# Patient Record
Sex: Male | Born: 1950 | Race: White | Hispanic: No | Smoking: Former smoker
Health system: Southern US, Community
[De-identification: ages and names within clinical notes are randomized; demographics above are authoritative.]

## PROBLEM LIST (undated history)

## (undated) DIAGNOSIS — I10 Essential (primary) hypertension: Secondary | ICD-10-CM

## (undated) DIAGNOSIS — H9312 Tinnitus, left ear: Secondary | ICD-10-CM

## (undated) DIAGNOSIS — H9190 Unspecified hearing loss, unspecified ear: Secondary | ICD-10-CM

## (undated) DIAGNOSIS — K759 Inflammatory liver disease, unspecified: Secondary | ICD-10-CM

## (undated) DIAGNOSIS — E7800 Pure hypercholesterolemia, unspecified: Secondary | ICD-10-CM

## (undated) DIAGNOSIS — F329 Major depressive disorder, single episode, unspecified: Secondary | ICD-10-CM

## (undated) DIAGNOSIS — F32A Depression, unspecified: Secondary | ICD-10-CM

## (undated) DIAGNOSIS — J189 Pneumonia, unspecified organism: Secondary | ICD-10-CM

## (undated) DIAGNOSIS — R011 Cardiac murmur, unspecified: Secondary | ICD-10-CM

## (undated) HISTORY — PX: NASAL SINUS SURGERY: SHX719

---

## 2016-02-13 DIAGNOSIS — I609 Nontraumatic subarachnoid hemorrhage, unspecified: Secondary | ICD-10-CM

## 2016-02-13 DIAGNOSIS — S069XAA Unspecified intracranial injury with loss of consciousness status unknown, initial encounter: Secondary | ICD-10-CM

## 2016-02-13 DIAGNOSIS — S069X9A Unspecified intracranial injury with loss of consciousness of unspecified duration, initial encounter: Secondary | ICD-10-CM

## 2016-02-13 HISTORY — DX: Unspecified intracranial injury with loss of consciousness status unknown, initial encounter: S06.9XAA

## 2016-02-13 HISTORY — DX: Nontraumatic subarachnoid hemorrhage, unspecified: I60.9

## 2016-02-13 HISTORY — DX: Unspecified intracranial injury with loss of consciousness of unspecified duration, initial encounter: S06.9X9A

## 2016-06-21 ENCOUNTER — Emergency Department (HOSPITAL_COMMUNITY): Payer: Medicare HMO

## 2016-06-21 ENCOUNTER — Encounter (HOSPITAL_COMMUNITY): Payer: Self-pay

## 2016-06-21 ENCOUNTER — Inpatient Hospital Stay (HOSPITAL_COMMUNITY)
Admission: EM | Admit: 2016-06-21 | Discharge: 2016-07-02 | DRG: 964 | Disposition: A | Discharge status: Rehabilitation Facility | Payer: Medicare HMO | Attending: Surgery | Admitting: Surgery

## 2016-06-21 DIAGNOSIS — Z23 Encounter for immunization: Secondary | ICD-10-CM

## 2016-06-21 DIAGNOSIS — S42021D Displaced fracture of shaft of right clavicle, subsequent encounter for fracture with routine healing: Secondary | ICD-10-CM | POA: Diagnosis not present

## 2016-06-21 DIAGNOSIS — S062X9D Diffuse traumatic brain injury with loss of consciousness of unspecified duration, subsequent encounter: Secondary | ICD-10-CM | POA: Diagnosis not present

## 2016-06-21 DIAGNOSIS — I82409 Acute embolism and thrombosis of unspecified deep veins of unspecified lower extremity: Secondary | ICD-10-CM | POA: Diagnosis not present

## 2016-06-21 DIAGNOSIS — S32591A Other specified fracture of right pubis, initial encounter for closed fracture: Secondary | ICD-10-CM | POA: Diagnosis present

## 2016-06-21 DIAGNOSIS — T1490XA Injury, unspecified, initial encounter: Secondary | ICD-10-CM

## 2016-06-21 DIAGNOSIS — J15211 Pneumonia due to Methicillin susceptible Staphylococcus aureus: Secondary | ICD-10-CM

## 2016-06-21 DIAGNOSIS — S42001A Fracture of unspecified part of right clavicle, initial encounter for closed fracture: Secondary | ICD-10-CM | POA: Diagnosis present

## 2016-06-21 DIAGNOSIS — R4701 Aphasia: Secondary | ICD-10-CM | POA: Diagnosis present

## 2016-06-21 DIAGNOSIS — S270XXA Traumatic pneumothorax, initial encounter: Secondary | ICD-10-CM | POA: Diagnosis present

## 2016-06-21 DIAGNOSIS — S069X9A Unspecified intracranial injury with loss of consciousness of unspecified duration, initial encounter: Secondary | ICD-10-CM

## 2016-06-21 DIAGNOSIS — S42034D Nondisplaced fracture of lateral end of right clavicle, subsequent encounter for fracture with routine healing: Secondary | ICD-10-CM | POA: Diagnosis not present

## 2016-06-21 DIAGNOSIS — R739 Hyperglycemia, unspecified: Secondary | ICD-10-CM | POA: Diagnosis not present

## 2016-06-21 DIAGNOSIS — E876 Hypokalemia: Secondary | ICD-10-CM | POA: Diagnosis not present

## 2016-06-21 DIAGNOSIS — R7989 Other specified abnormal findings of blood chemistry: Secondary | ICD-10-CM

## 2016-06-21 DIAGNOSIS — M7989 Other specified soft tissue disorders: Secondary | ICD-10-CM | POA: Diagnosis not present

## 2016-06-21 DIAGNOSIS — S2239XS Fracture of one rib, unspecified side, sequela: Secondary | ICD-10-CM

## 2016-06-21 DIAGNOSIS — R1310 Dysphagia, unspecified: Secondary | ICD-10-CM | POA: Diagnosis not present

## 2016-06-21 DIAGNOSIS — S2241XA Multiple fractures of ribs, right side, initial encounter for closed fracture: Secondary | ICD-10-CM | POA: Diagnosis present

## 2016-06-21 DIAGNOSIS — H9190 Unspecified hearing loss, unspecified ear: Secondary | ICD-10-CM | POA: Diagnosis present

## 2016-06-21 DIAGNOSIS — S062X9A Diffuse traumatic brain injury with loss of consciousness of unspecified duration, initial encounter: Principal | ICD-10-CM | POA: Diagnosis present

## 2016-06-21 DIAGNOSIS — R1312 Dysphagia, oropharyngeal phase: Secondary | ICD-10-CM | POA: Diagnosis not present

## 2016-06-21 DIAGNOSIS — R402432 Glasgow coma scale score 3-8, at arrival to emergency department: Secondary | ICD-10-CM | POA: Diagnosis present

## 2016-06-21 DIAGNOSIS — S299XXA Unspecified injury of thorax, initial encounter: Secondary | ICD-10-CM

## 2016-06-21 DIAGNOSIS — S069X9D Unspecified intracranial injury with loss of consciousness of unspecified duration, subsequent encounter: Secondary | ICD-10-CM | POA: Diagnosis not present

## 2016-06-21 DIAGNOSIS — I615 Nontraumatic intracerebral hemorrhage, intraventricular: Secondary | ICD-10-CM

## 2016-06-21 DIAGNOSIS — S066X9A Traumatic subarachnoid hemorrhage with loss of consciousness of unspecified duration, initial encounter: Secondary | ICD-10-CM | POA: Diagnosis present

## 2016-06-21 DIAGNOSIS — Z7409 Other reduced mobility: Secondary | ICD-10-CM | POA: Diagnosis not present

## 2016-06-21 DIAGNOSIS — S3210XA Unspecified fracture of sacrum, initial encounter for closed fracture: Secondary | ICD-10-CM

## 2016-06-21 DIAGNOSIS — S062X3D Diffuse traumatic brain injury with loss of consciousness of 1 hour to 5 hours 59 minutes, subsequent encounter: Secondary | ICD-10-CM | POA: Diagnosis not present

## 2016-06-21 DIAGNOSIS — F17210 Nicotine dependence, cigarettes, uncomplicated: Secondary | ICD-10-CM | POA: Diagnosis present

## 2016-06-21 DIAGNOSIS — R471 Dysarthria and anarthria: Secondary | ICD-10-CM | POA: Diagnosis present

## 2016-06-21 DIAGNOSIS — F329 Major depressive disorder, single episode, unspecified: Secondary | ICD-10-CM | POA: Diagnosis present

## 2016-06-21 DIAGNOSIS — S329XXA Fracture of unspecified parts of lumbosacral spine and pelvis, initial encounter for closed fracture: Secondary | ICD-10-CM

## 2016-06-21 DIAGNOSIS — R339 Retention of urine, unspecified: Secondary | ICD-10-CM | POA: Diagnosis not present

## 2016-06-21 DIAGNOSIS — S32591D Other specified fracture of right pubis, subsequent encounter for fracture with routine healing: Secondary | ICD-10-CM | POA: Diagnosis not present

## 2016-06-21 DIAGNOSIS — G8191 Hemiplegia, unspecified affecting right dominant side: Secondary | ICD-10-CM | POA: Diagnosis present

## 2016-06-21 DIAGNOSIS — S069XAA Unspecified intracranial injury with loss of consciousness status unknown, initial encounter: Secondary | ICD-10-CM

## 2016-06-21 DIAGNOSIS — R069 Unspecified abnormalities of breathing: Secondary | ICD-10-CM

## 2016-06-21 DIAGNOSIS — G9340 Encephalopathy, unspecified: Secondary | ICD-10-CM | POA: Diagnosis not present

## 2016-06-21 DIAGNOSIS — I10 Essential (primary) hypertension: Secondary | ICD-10-CM | POA: Diagnosis present

## 2016-06-21 DIAGNOSIS — I609 Nontraumatic subarachnoid hemorrhage, unspecified: Secondary | ICD-10-CM | POA: Diagnosis not present

## 2016-06-21 DIAGNOSIS — R509 Fever, unspecified: Secondary | ICD-10-CM

## 2016-06-21 DIAGNOSIS — S2239XA Fracture of one rib, unspecified side, initial encounter for closed fracture: Secondary | ICD-10-CM

## 2016-06-21 DIAGNOSIS — S2249XA Multiple fractures of ribs, unspecified side, initial encounter for closed fracture: Secondary | ICD-10-CM

## 2016-06-21 DIAGNOSIS — W11XXXA Fall on and from ladder, initial encounter: Secondary | ICD-10-CM | POA: Diagnosis not present

## 2016-06-21 DIAGNOSIS — J9690 Respiratory failure, unspecified, unspecified whether with hypoxia or hypercapnia: Secondary | ICD-10-CM

## 2016-06-21 DIAGNOSIS — R918 Other nonspecific abnormal finding of lung field: Secondary | ICD-10-CM

## 2016-06-21 DIAGNOSIS — S062X3S Diffuse traumatic brain injury with loss of consciousness of 1 hour to 5 hours 59 minutes, sequela: Secondary | ICD-10-CM | POA: Diagnosis not present

## 2016-06-21 DIAGNOSIS — S2241XD Multiple fractures of ribs, right side, subsequent encounter for fracture with routine healing: Secondary | ICD-10-CM | POA: Diagnosis not present

## 2016-06-21 DIAGNOSIS — G479 Sleep disorder, unspecified: Secondary | ICD-10-CM | POA: Diagnosis not present

## 2016-06-21 DIAGNOSIS — J15212 Pneumonia due to Methicillin resistant Staphylococcus aureus: Secondary | ICD-10-CM | POA: Diagnosis not present

## 2016-06-21 DIAGNOSIS — S42034S Nondisplaced fracture of lateral end of right clavicle, sequela: Secondary | ICD-10-CM | POA: Diagnosis not present

## 2016-06-21 DIAGNOSIS — G3184 Mild cognitive impairment, so stated: Secondary | ICD-10-CM | POA: Diagnosis not present

## 2016-06-21 HISTORY — DX: Pure hypercholesterolemia, unspecified: E78.00

## 2016-06-21 HISTORY — DX: Cardiac murmur, unspecified: R01.1

## 2016-06-21 HISTORY — DX: Unspecified hearing loss, unspecified ear: H91.90

## 2016-06-21 HISTORY — DX: Essential (primary) hypertension: I10

## 2016-06-21 HISTORY — DX: Major depressive disorder, single episode, unspecified: F32.9

## 2016-06-21 HISTORY — DX: Depression, unspecified: F32.A

## 2016-06-21 HISTORY — DX: Tinnitus, left ear: H93.12

## 2016-06-21 LAB — BPAM FFP
Blood Product Expiration Date: 201805212359
Blood Product Expiration Date: 201805212359
ISSUE DATE / TIME: 201805101640
ISSUE DATE / TIME: 201805101640
UNIT TYPE AND RH: 600
UNIT TYPE AND RH: 6200

## 2016-06-21 LAB — COMPREHENSIVE METABOLIC PANEL
ALT: 59 U/L (ref 17–63)
AST: 66 U/L — AB (ref 15–41)
Albumin: 4.1 g/dL (ref 3.5–5.0)
Alkaline Phosphatase: 89 U/L (ref 38–126)
Anion gap: 12 (ref 5–15)
BUN: 15 mg/dL (ref 6–20)
CHLORIDE: 103 mmol/L (ref 101–111)
CO2: 23 mmol/L (ref 22–32)
CREATININE: 1.31 mg/dL — AB (ref 0.61–1.24)
Calcium: 9.3 mg/dL (ref 8.9–10.3)
GFR calc Af Amer: >60 mL/min (ref >60)
GFR calc non Af Amer: 56 mL/min — ABNORMAL LOW (ref >60)
GLUCOSE: 167 mg/dL — AB (ref 65–99)
Potassium: 3.9 mmol/L (ref 3.5–5.1)
SODIUM: 138 mmol/L (ref 135–145)
Total Bilirubin: 1.1 mg/dL (ref 0.3–1.2)
Total Protein: 7 g/dL (ref 6.5–8.1)

## 2016-06-21 LAB — URINALYSIS, ROUTINE W REFLEX MICROSCOPIC
BILIRUBIN URINE: NEGATIVE
GLUCOSE, UA: NEGATIVE mg/dL
HGB URINE DIPSTICK: NEGATIVE
KETONES UR: NEGATIVE mg/dL
Leukocytes, UA: NEGATIVE
Nitrite: NEGATIVE
PH: 6 (ref 5.0–8.0)
Protein, ur: NEGATIVE mg/dL
SPECIFIC GRAVITY, URINE: 1.033 — AB (ref 1.005–1.030)

## 2016-06-21 LAB — PREPARE FRESH FROZEN PLASMA
UNIT DIVISION: 0
Unit division: 0

## 2016-06-21 LAB — I-STAT CHEM 8, ED
BUN: 17 mg/dL (ref 6–20)
Calcium, Ion: 1.08 mmol/L — ABNORMAL LOW (ref 1.15–1.40)
Chloride: 103 mmol/L (ref 101–111)
Creatinine, Ser: 1.2 mg/dL (ref 0.61–1.24)
Glucose, Bld: 159 mg/dL — ABNORMAL HIGH (ref 65–99)
HEMATOCRIT: 48 % (ref 39.0–52.0)
HEMOGLOBIN: 16.3 g/dL (ref 13.0–17.0)
POTASSIUM: 3.8 mmol/L (ref 3.5–5.1)
Sodium: 140 mmol/L (ref 135–145)
TCO2: 26 mmol/L (ref 0–100)

## 2016-06-21 LAB — CBC
HCT: 47.8 % (ref 39.0–52.0)
Hemoglobin: 16.6 g/dL (ref 13.0–17.0)
MCH: 32.7 pg (ref 26.0–34.0)
MCHC: 34.7 g/dL (ref 30.0–36.0)
MCV: 94.1 fL (ref 78.0–100.0)
PLATELETS: 229 10*3/uL (ref 150–400)
RBC: 5.08 MIL/uL (ref 4.22–5.81)
RDW: 13.6 % (ref 11.5–15.5)
WBC: 26.3 10*3/uL — ABNORMAL HIGH (ref 4.0–10.5)

## 2016-06-21 LAB — ABO/RH: ABO/RH(D): B POS

## 2016-06-21 LAB — I-STAT CG4 LACTIC ACID, ED: Lactic Acid, Venous: 2.44 mmol/L (ref 0.5–1.9)

## 2016-06-21 LAB — PROTIME-INR
INR: 1.03
Prothrombin Time: 13.5 seconds (ref 11.4–15.2)

## 2016-06-21 LAB — ETHANOL: Alcohol, Ethyl (B): <5 mg/dL (ref <5)

## 2016-06-21 LAB — MRSA PCR SCREENING: MRSA BY PCR: NEGATIVE

## 2016-06-21 MED ORDER — ONDANSETRON HCL 4 MG/2ML IJ SOLN
INTRAMUSCULAR | Status: AC
  Filled 2016-06-21: qty 2

## 2016-06-21 MED ORDER — ONDANSETRON HCL 4 MG/2ML IJ SOLN
4.0000 mg | Freq: Once | INTRAMUSCULAR | Status: AC
  Administered 2016-06-21: 4 mg via INTRAVENOUS

## 2016-06-21 MED ORDER — MORPHINE SULFATE (PF) 4 MG/ML IV SOLN
1.0000 mg | INTRAVENOUS | Status: DC | PRN
  Administered 2016-06-21 – 2016-06-22 (×2): 1 mg via INTRAVENOUS
  Filled 2016-06-21 (×2): qty 1

## 2016-06-21 MED ORDER — SODIUM CHLORIDE 0.9 % IV BOLUS (SEPSIS)
1000.0000 mL | Freq: Once | INTRAVENOUS | Status: AC
  Administered 2016-06-21: 1000 mL via INTRAVENOUS

## 2016-06-21 MED ORDER — SODIUM CHLORIDE 0.9 % IV SOLN
INTRAVENOUS | Status: DC
  Administered 2016-06-21 – 2016-07-02 (×10): via INTRAVENOUS

## 2016-06-21 MED ORDER — ONDANSETRON HCL 4 MG/2ML IJ SOLN
4.0000 mg | Freq: Four times a day (QID) | INTRAMUSCULAR | Status: DC | PRN
  Filled 2016-06-21: qty 2

## 2016-06-21 MED ORDER — ONDANSETRON HCL 4 MG PO TABS: 4.0000 mg | ORAL_TABLET | Freq: Four times a day (QID) | ORAL | Status: DC | PRN

## 2016-06-21 MED ORDER — IOPAMIDOL (ISOVUE-300) INJECTION 61%
INTRAVENOUS | Status: AC
  Administered 2016-06-21: 100 mL
  Filled 2016-06-21: qty 100

## 2016-06-21 MED ORDER — MORPHINE SULFATE (PF) 4 MG/ML IV SOLN
4.0000 mg | INTRAVENOUS | Status: DC | PRN
  Administered 2016-06-22 – 2016-06-27 (×27): 4 mg via INTRAVENOUS
  Filled 2016-06-21 (×26): qty 1

## 2016-06-21 MED ORDER — PNEUMOCOCCAL VAC POLYVALENT 25 MCG/0.5ML IJ INJ
0.5000 mL | INJECTION | INTRAMUSCULAR | Status: AC
  Administered 2016-06-22: 0.5 mL via INTRAMUSCULAR
  Filled 2016-06-21: qty 0.5

## 2016-06-21 MED ORDER — MORPHINE SULFATE (PF) 4 MG/ML IV SOLN
2.0000 mg | INTRAVENOUS | Status: DC | PRN
  Administered 2016-06-22 – 2016-06-25 (×7): 2 mg via INTRAVENOUS
  Filled 2016-06-21 (×10): qty 1

## 2016-06-21 NOTE — ED Provider Notes (Signed)
[facility]-EMERGENCY DEPT Provider Note   CSN: [id number] Arrival date & time: 06/21/16  1652     History   Chief Complaint Chief Complaint  Patient presents with  . Fall    HPI Tyler Lane is a 66 y.o. male.  The history is provided by the EMS personnel.  Trauma Mechanism of injury: fall Injury location: head/neck Injury location detail: head Incident location: home Arrived directly from scene: yes   Fall:      Fall occurred: from a ladder and from a roof      Height of fall: possibly 10 feet or more      Impact surface: unknown      Point of impact: unknown  EMS/PTA data:      Bystander interventions: none      Ambulatory at scene: no      Blood loss: none      Responsiveness: responsive to pain      Airway interventions: none      Breathing interventions: none      IV access: established      Fluids administered: none      Cardiac interventions: none      Medications administered: none      Immobilization: C-collar      Airway condition since incident: stable      Breathing condition since incident: stable      Circulation condition since incident: stable      Mental status condition since incident: improving (GCS 8 with EMS and on arrival GCS 9)      Disability condition since incident: stable  Current symptoms:      Pain quality: unable to describe  Relevant PMH:      Tetanus status: unknown   Past Medical History:  Diagnosis Date  . Depression   . Heart murmur    previously  . High cholesterol   . HOH (hard of hearing)   . Hypertension     Patient Active Problem List   Diagnosis Date Noted  . TBI (traumatic brain injury) (HCC) 06/21/2016    History reviewed. No pertinent surgical history.     Home Medications    Prior to Admission medications   Not on File    Family History History reviewed. No pertinent family history.  Social History Social History  Substance Use Topics  . Smoking status: Current Every Day Smoker   Packs/day: 1.00    Years: 20.00    Types: Cigarettes  . Smokeless tobacco: Never Used     Comment: patient unable to respond  . Alcohol use No     Allergies   Patient has no known allergies.   Review of Systems Review of Systems  Unable to perform ROS: Acuity of condition     Physical Exam Updated Vital Signs ED Triage Vitals  Enc Vitals Group     BP 06/21/16 1655 110/70     Pulse Rate 06/21/16 1655 75     Resp 06/21/16 1655 (!) 25     Temp 06/21/16 1657 98.5 F (36.9 C)     Temp Source 06/21/16 1657 Temporal     SpO2 06/21/16 1651 92 %     Weight 06/21/16 2020 178 lb 2.1 oz (80.8 kg)     Height 06/21/16 1705 5\' 10"  (1.778 m)     Head Circumference --      Peak Flow --      Pain Score --      Pain Loc --  Pain Edu? --      Excl. in [facility]? --     Physical Exam  Constitutional: He appears distressed.  HENT:  Head: Normocephalic.  Mouth/Throat: Oropharynx is clear and moist.  Bruising to right side of face, TMs clear  Eyes: EOM are normal. Pupils are equal, round, and reactive to light.  Neck: Neck supple.  In c-collar  Cardiovascular: Normal rate, regular rhythm, normal heart sounds and intact distal pulses.   Pulmonary/Chest: Effort normal and breath sounds normal. No respiratory distress.  Abdominal: Soft. He exhibits no distension. There is no tenderness.  Musculoskeletal: He exhibits tenderness (TTP to right shoulder).  Neurological: He is alert.  Responds to pain and voice, opens eyes to voice, moves extremities, does not speak  Skin:  Bruising to right shoulder     ED Treatments / Results  Labs (all labs ordered are listed, but only abnormal results are displayed) Labs Reviewed  COMPREHENSIVE METABOLIC PANEL - Abnormal; Notable for the following:       Result Value   Glucose, Bld 167 (*)    Creatinine, Ser 1.31 (*)    AST 66 (*)    GFR calc non Af Amer 56 (*)    All other components within normal limits  CBC - Abnormal; Notable for the  following:    WBC 26.3 (*)    All other components within normal limits  URINALYSIS, ROUTINE W REFLEX MICROSCOPIC - Abnormal; Notable for the following:    Specific Gravity, Urine 1.033 (*)    All other components within normal limits  I-STAT CHEM 8, ED - Abnormal; Notable for the following:    Glucose, Bld 159 (*)    Calcium, Ion 1.08 (*)    All other components within normal limits  I-STAT CG4 LACTIC ACID, ED - Abnormal; Notable for the following:    Lactic Acid, Venous 2.44 (*)    All other components within normal limits  MRSA PCR SCREENING  ETHANOL  PROTIME-INR  CDS SEROLOGY  CBC  BASIC METABOLIC PANEL  TYPE AND SCREEN  PREPARE FRESH FROZEN PLASMA  ABO/RH    EKG  EKG Interpretation None       Radiology Ct Head Wo Contrast  Result Date: 06/21/2016 CLINICAL DATA:  Found on ground near ladder. Patient unresponsive. Concern for head, maxillofacial or cervical spine injury. Initial encounter. EXAM: CT HEAD WITHOUT CONTRAST CT MAXILLOFACIAL WITHOUT CONTRAST CT CERVICAL SPINE WITHOUT CONTRAST TECHNIQUE: Multidetector CT imaging of the head, cervical spine, and maxillofacial structures were performed using the standard protocol without intravenous contrast. Multiplanar CT image reconstructions of the cervical spine and maxillofacial structures were also generated. COMPARISON:  None. FINDINGS: CT HEAD FINDINGS Brain: There appears to be a small amount of subarachnoid hemorrhage along the sylvian fissures bilaterally, and at the high left parietal lobe and right frontal lobe. No evidence of acute infarction, hemorrhage, hydrocephalus, extra-axial collection or mass lesion/mass effect. Prominence of the ventricles and sulci reflects mild cortical volume loss. Mild cerebellar atrophy is noted. The brainstem and fourth ventricle are within normal limits. The basal ganglia are unremarkable in appearance. The cerebral hemispheres demonstrate grossly normal gray-white differentiation. No mass  effect or midline shift is seen. Vascular: No hyperdense vessel or unexpected calcification. Skull: There is no evidence of fracture; visualized osseous structures are unremarkable in appearance. Other: Mild soft tissue swelling is noted lateral to the right orbit. CT MAXILLOFACIAL FINDINGS Osseous: There is no evidence of fracture or dislocation. The maxilla and mandible appear intact. The nasal  bone is unremarkable in appearance. The visualized dentition demonstrates no acute abnormality. Orbits: The orbits are intact bilaterally. Sinuses: Mucosal thickening is noted at the left maxillary sinus. The remaining visualized paranasal sinuses and mastoid air cells are well-aerated. Soft tissues: Soft tissue swelling is noted along the upper lip. Mild soft tissue injury is suggested at the anterior vertex. The parapharyngeal fat planes are preserved. The nasopharynx, oropharynx and hypopharynx are unremarkable in appearance. The visualized portions of the valleculae and piriform sinuses are grossly unremarkable. The parotid and submandibular glands are within normal limits. No cervical lymphadenopathy is seen. CT CERVICAL SPINE FINDINGS Alignment: Normal. Skull base and vertebrae: No acute fracture. No primary bone lesion or focal pathologic process. There is a displaced fracture through the right mid clavicle. Soft tissues and spinal canal: No prevertebral fluid or swelling. No visible canal hematoma. Disc levels: Intervertebral disc spaces are preserved. Mild facet disease is noted at the mid cervical spine. Mild degenerative change is noted about the dens. Upper chest: Emphysema is noted at the lung apices. The thyroid gland is unremarkable in appearance. Mild calcification is noted at the left carotid bifurcation. Other: No additional soft tissue abnormalities are seen. IMPRESSION: 1. Mild acute subarachnoid hemorrhage along the sylvian fissures bilaterally, at the high left parietal lobe and right right frontal  lobe. 2. No evidence of fracture or subluxation along the cervical spine. 3. Displaced fracture through the right mid clavicle. 4. No evidence of fracture or dislocation with regard to the maxillofacial structures. 5. Soft tissue swelling along the upper lip. Mild soft tissue injury suggested at the anterior vertex. Mild soft tissue swelling lateral to the right orbit. 6. Mild cortical volume loss. 7. Emphysema at the lung apices. 8. Mild calcification at the left carotid bifurcation. Critical Value/emergent results were called by telephone at the time of interpretation on 06/21/2016 at 6:12 pm to Dr. Magnus Ivan, who verbally acknowledged these results. Electronically Signed   By: Roanna Raider M.D.   On: 06/21/2016 18:17   Ct Cervical Spine Wo Contrast  Result Date: 06/21/2016 CLINICAL DATA:  Found on ground near ladder. Patient unresponsive. Concern for head, maxillofacial or cervical spine injury. Initial encounter. EXAM: CT HEAD WITHOUT CONTRAST CT MAXILLOFACIAL WITHOUT CONTRAST CT CERVICAL SPINE WITHOUT CONTRAST TECHNIQUE: Multidetector CT imaging of the head, cervical spine, and maxillofacial structures were performed using the standard protocol without intravenous contrast. Multiplanar CT image reconstructions of the cervical spine and maxillofacial structures were also generated. COMPARISON:  None. FINDINGS: CT HEAD FINDINGS Brain: There appears to be a small amount of subarachnoid hemorrhage along the sylvian fissures bilaterally, and at the high left parietal lobe and right frontal lobe. No evidence of acute infarction, hemorrhage, hydrocephalus, extra-axial collection or mass lesion/mass effect. Prominence of the ventricles and sulci reflects mild cortical volume loss. Mild cerebellar atrophy is noted. The brainstem and fourth ventricle are within normal limits. The basal ganglia are unremarkable in appearance. The cerebral hemispheres demonstrate grossly normal gray-white differentiation. No mass  effect or midline shift is seen. Vascular: No hyperdense vessel or unexpected calcification. Skull: There is no evidence of fracture; visualized osseous structures are unremarkable in appearance. Other: Mild soft tissue swelling is noted lateral to the right orbit. CT MAXILLOFACIAL FINDINGS Osseous: There is no evidence of fracture or dislocation. The maxilla and mandible appear intact. The nasal bone is unremarkable in appearance. The visualized dentition demonstrates no acute abnormality. Orbits: The orbits are intact bilaterally. Sinuses: Mucosal thickening is noted at the left maxillary sinus.  The remaining visualized paranasal sinuses and mastoid air cells are well-aerated. Soft tissues: Soft tissue swelling is noted along the upper lip. Mild soft tissue injury is suggested at the anterior vertex. The parapharyngeal fat planes are preserved. The nasopharynx, oropharynx and hypopharynx are unremarkable in appearance. The visualized portions of the valleculae and piriform sinuses are grossly unremarkable. The parotid and submandibular glands are within normal limits. No cervical lymphadenopathy is seen. CT CERVICAL SPINE FINDINGS Alignment: Normal. Skull base and vertebrae: No acute fracture. No primary bone lesion or focal pathologic process. There is a displaced fracture through the right mid clavicle. Soft tissues and spinal canal: No prevertebral fluid or swelling. No visible canal hematoma. Disc levels: Intervertebral disc spaces are preserved. Mild facet disease is noted at the mid cervical spine. Mild degenerative change is noted about the dens. Upper chest: Emphysema is noted at the lung apices. The thyroid gland is unremarkable in appearance. Mild calcification is noted at the left carotid bifurcation. Other: No additional soft tissue abnormalities are seen. IMPRESSION: 1. Mild acute subarachnoid hemorrhage along the sylvian fissures bilaterally, at the high left parietal lobe and right right frontal  lobe. 2. No evidence of fracture or subluxation along the cervical spine. 3. Displaced fracture through the right mid clavicle. 4. No evidence of fracture or dislocation with regard to the maxillofacial structures. 5. Soft tissue swelling along the upper lip. Mild soft tissue injury suggested at the anterior vertex. Mild soft tissue swelling lateral to the right orbit. 6. Mild cortical volume loss. 7. Emphysema at the lung apices. 8. Mild calcification at the left carotid bifurcation. Critical Value/emergent results were called by telephone at the time of interpretation on 06/21/2016 at 6:12 pm to Dr. Magnus Ivan, who verbally acknowledged these results. Electronically Signed   By: Roanna Raider M.D.   On: 06/21/2016 18:17   Dg Pelvis Portable  Result Date: 06/21/2016 CLINICAL DATA:  66 year old who fell from a 10 foot ladder earlier today. Initial encounter. EXAM: PORTABLE PELVIS 1-2 VIEWS COMPARISON:  None. FINDINGS: Comminuted, mildly displaced fracture involving the right superior pubic ramus. No other visible fractures. Sacroiliac joints and symphysis pubis intact without evidence of diastasis. Benign bone island in the roof of the right acetabulum. IMPRESSION: Acute traumatic mildly displaced fracture involving the right superior pubic ramus. Electronically Signed   By: Hulan Saas M.D.   On: 06/21/2016 17:19   Dg Chest Port 1 View  Result Date: 06/21/2016 CLINICAL DATA:  66 year old who fell from a 10 foot ladder earlier today. Initial encounter. EXAM: PORTABLE CHEST 1 VIEW COMPARISON:  None. FINDINGS: Suboptimal inspiration accounts for crowded bronchovascular markings, especially in the bases, and accentuates the cardiac silhouette. Taking this into account, cardiac silhouette upper normal in size. Thoracic aorta mildly atherosclerotic. Slight widening of the superior mediastinum. Linear atelectasis in the right mid lung. Linear scar or atelectasis in the left mid lung. Comminuted fracture  involving the right lateral second rib. No visible pneumothorax. IMPRESSION: 1. Suboptimal inspiration. Linear atelectasis in the right mid lung. Linear atelectasis or scarring left mid lung. 2. Comminuted fracture involving the right lateral second rib. No visible pneumothorax. 3. Slight widening of the mediastinum, though this may just be positional and related to the AP portable technique. I telephoned these results at the time of interpretation on 06/21/2016 at 5:22 pm to Dr. Virgina Norfolk, who verbally acknowledged these results. Electronically Signed   By: Hulan Saas M.D.   On: 06/21/2016 17:26   Ct Maxillofacial Wo Cm  Result Date: 06/21/2016 CLINICAL DATA:  Found on ground near ladder. Patient unresponsive. Concern for head, maxillofacial or cervical spine injury. Initial encounter. EXAM: CT HEAD WITHOUT CONTRAST CT MAXILLOFACIAL WITHOUT CONTRAST CT CERVICAL SPINE WITHOUT CONTRAST TECHNIQUE: Multidetector CT imaging of the head, cervical spine, and maxillofacial structures were performed using the standard protocol without intravenous contrast. Multiplanar CT image reconstructions of the cervical spine and maxillofacial structures were also generated. COMPARISON:  None. FINDINGS: CT HEAD FINDINGS Brain: There appears to be a small amount of subarachnoid hemorrhage along the sylvian fissures bilaterally, and at the high left parietal lobe and right frontal lobe. No evidence of acute infarction, hemorrhage, hydrocephalus, extra-axial collection or mass lesion/mass effect. Prominence of the ventricles and sulci reflects mild cortical volume loss. Mild cerebellar atrophy is noted. The brainstem and fourth ventricle are within normal limits. The basal ganglia are unremarkable in appearance. The cerebral hemispheres demonstrate grossly normal gray-white differentiation. No mass effect or midline shift is seen. Vascular: No hyperdense vessel or unexpected calcification. Skull: There is no evidence of  fracture; visualized osseous structures are unremarkable in appearance. Other: Mild soft tissue swelling is noted lateral to the right orbit. CT MAXILLOFACIAL FINDINGS Osseous: There is no evidence of fracture or dislocation. The maxilla and mandible appear intact. The nasal bone is unremarkable in appearance. The visualized dentition demonstrates no acute abnormality. Orbits: The orbits are intact bilaterally. Sinuses: Mucosal thickening is noted at the left maxillary sinus. The remaining visualized paranasal sinuses and mastoid air cells are well-aerated. Soft tissues: Soft tissue swelling is noted along the upper lip. Mild soft tissue injury is suggested at the anterior vertex. The parapharyngeal fat planes are preserved. The nasopharynx, oropharynx and hypopharynx are unremarkable in appearance. The visualized portions of the valleculae and piriform sinuses are grossly unremarkable. The parotid and submandibular glands are within normal limits. No cervical lymphadenopathy is seen. CT CERVICAL SPINE FINDINGS Alignment: Normal. Skull base and vertebrae: No acute fracture. No primary bone lesion or focal pathologic process. There is a displaced fracture through the right mid clavicle. Soft tissues and spinal canal: No prevertebral fluid or swelling. No visible canal hematoma. Disc levels: Intervertebral disc spaces are preserved. Mild facet disease is noted at the mid cervical spine. Mild degenerative change is noted about the dens. Upper chest: Emphysema is noted at the lung apices. The thyroid gland is unremarkable in appearance. Mild calcification is noted at the left carotid bifurcation. Other: No additional soft tissue abnormalities are seen. IMPRESSION: 1. Mild acute subarachnoid hemorrhage along the sylvian fissures bilaterally, at the high left parietal lobe and right right frontal lobe. 2. No evidence of fracture or subluxation along the cervical spine. 3. Displaced fracture through the right mid clavicle.  4. No evidence of fracture or dislocation with regard to the maxillofacial structures. 5. Soft tissue swelling along the upper lip. Mild soft tissue injury suggested at the anterior vertex. Mild soft tissue swelling lateral to the right orbit. 6. Mild cortical volume loss. 7. Emphysema at the lung apices. 8. Mild calcification at the left carotid bifurcation. Critical Value/emergent results were called by telephone at the time of interpretation on 06/21/2016 at 6:12 pm to Dr. Magnus IvanBlackman, who verbally acknowledged these results. Electronically Signed   By: Roanna RaiderJeffery  Chang M.D.   On: 06/21/2016 18:17    Procedures Procedures (including critical care time)  Medications Ordered in ED Medications  0.9 %  sodium chloride infusion ( Intravenous Duplicate 06/21/16 2139)  morphine 4 MG/ML injection 1 mg (1 mg  Intravenous Given 06/21/16 2242)  morphine 4 MG/ML injection 2 mg (not administered)  morphine 4 MG/ML injection 4 mg (not administered)  ondansetron (ZOFRAN) tablet 4 mg (not administered)    Or  ondansetron (ZOFRAN) injection 4 mg (not administered)  pneumococcal 23 valent vaccine (PNU-IMMUNE) injection 0.5 mL (not administered)  ondansetron (ZOFRAN) 4 MG/2ML injection (  Duplicate 06/21/16 2040)  sodium chloride 0.9 % bolus 1,000 mL (1,000 mLs Intravenous New Bag/Given 06/21/16 1742)  ondansetron (ZOFRAN) injection 4 mg (4 mg Intravenous Given 06/21/16 1655)  iopamidol (ISOVUE-300) 61 % injection (100 mLs  Contrast Given 06/21/16 1730)     Initial Impression / Assessment and Plan / ED Course  I have reviewed the triage vital signs and the nursing notes.  Pertinent labs & imaging results that were available during my care of the patient were reviewed by me and considered in my medical decision making (see chart for details).     Alee Katen is a 66 year old male who presents to the ED as a level I trauma after being found down next to a ladder, presumed fall of about 10 feet. Patient's vitals at  time of arrival to the ED are unremarkable and patient is without fever. Patient with GCS of 8 with EMS and seemed to improve to GCS of 9. Patient is able to follow commands and respond to pain but is unable to speak. Patient has bruising over the right side of his face and right side of his arm. Patient appears to be tender in the right clavicle area as well. Patient otherwise with no obvious gross deformities. Patient with trauma labs and scans ordered. Bedside cxr and pelvic xray should rib fracture and stable pelvic fx. Patient found to have subarachnoid hemorrhage bilaterally, right clavicle fracture, right rib fracture, pelvic fracture. Trauma surgery to admit the patient and assumed care of patient in stable condition. Trauma, Dr. Magnus Ivan, to contact neurosurgery and other consults. Labs overall unremarkable except for a lactic acidosis and leukocytosis likely secondary to trauma. Patient given normal saline bolus. Patient transferred to the ICU in stable but critical condition. Mental status stable throughout ED stay.   Final Clinical Impressions(s) / ED Diagnoses   Final diagnoses:  SAH (subarachnoid hemorrhage) (HCC)  Closed nondisplaced fracture of right clavicle, unspecified part of clavicle, initial encounter  Closed fracture of one rib, unspecified laterality, sequela    New Prescriptions There are no discharge medications for this patient.    Virgina Norfolk, DO 06/22/16 1914

## 2016-06-21 NOTE — ED Triage Notes (Signed)
Pt brought in by [facility]. Pt found on ground near ladder. Pt unresponsive at seen. Height of ladder to roof approximately 9012ft per EMS. Pt disoriented on arrival with no clear speech. No obvious signs of injury. Initial GCS of 8. Last seen normal 1330.

## 2016-06-21 NOTE — H&P (Addendum)
History   Tyler Lane is an 66 y.o. male.   Chief Complaint:  Chief Complaint  Patient presents with  . Fall    Fall   This gentleman presents as a level I trauma after a fall. The fall was not witnessed. He was found down beside an approximate 10 foot ladder. He arrived with a GCS reported at 8. He was otherwise hemodynamically stable.  No past medical history on file.  No past surgical history on file.  No family history on file. Social History:  has no tobacco, alcohol, and drug history on file.  Allergies  Allergies not on file  Home Medications   (Not in a hospital admission)  Trauma Course   Results for orders placed or performed during the hospital encounter of 06/21/16 (from the past 48 hour(s))  Prepare fresh frozen plasma     Status: None (Preliminary result)   Collection Time: 06/21/16  4:38 PM  Result Value Ref Range   Unit Number [id number]    Blood Component Type LIQ PLASMA    Unit division 00    Status of Unit ISSUED    Unit tag comment VERBAL ORDERS PER DR [facility]    Transfusion Status OK TO TRANSFUSE    Unit Number [id number]    Blood Component Type LIQ PLASMA    Unit division 00    Status of Unit ISSUED    Unit tag comment VERBAL ORDERS PER DR [facility]    Transfusion Status OK TO TRANSFUSE   Comprehensive metabolic panel     Status: Abnormal   Collection Time: 06/21/16  4:59 PM  Result Value Ref Range   Sodium 138 135 - 145 mmol/L   Potassium 3.9 3.5 - 5.1 mmol/L   Chloride 103 101 - 111 mmol/L   CO2 23 22 - 32 mmol/L   Glucose, Bld 167 (H) 65 - 99 mg/dL   BUN 15 6 - 20 mg/dL   Creatinine, Ser 1.31 (H) 0.61 - 1.24 mg/dL   Calcium 9.3 8.9 - 10.3 mg/dL   Total Protein 7.0 6.5 - 8.1 g/dL   Albumin 4.1 3.5 - 5.0 g/dL   AST 66 (H) 15 - 41 U/L   ALT 59 17 - 63 U/L   Alkaline Phosphatase 89 38 - 126 U/L   Total Bilirubin 1.1 0.3 - 1.2 mg/dL   GFR calc non Af Amer 56 (L) >60 mL/min   GFR calc Af Amer >60 >60 mL/min    Comment:  (NOTE) The eGFR has been calculated using the CKD EPI equation. This calculation has not been validated in all clinical situations. eGFR's persistently <60 mL/min signify possible Chronic Kidney Disease.    Anion gap 12 5 - 15  CBC     Status: Abnormal   Collection Time: 06/21/16  4:59 PM  Result Value Ref Range   WBC 26.3 (H) 4.0 - 10.5 K/uL   RBC 5.08 4.22 - 5.81 MIL/uL   Hemoglobin 16.6 13.0 - 17.0 g/dL   HCT 47.8 39.0 - 52.0 %   MCV 94.1 78.0 - 100.0 fL   MCH 32.7 26.0 - 34.0 pg   MCHC 34.7 30.0 - 36.0 g/dL   RDW 13.6 11.5 - 15.5 %   Platelets 229 150 - 400 K/uL  Ethanol     Status: None   Collection Time: 06/21/16  4:59 PM  Result Value Ref Range   Alcohol, Ethyl (B) <5 <5 mg/dL    Comment:        LOWEST  DETECTABLE LIMIT FOR SERUM ALCOHOL IS 5 mg/dL FOR MEDICAL PURPOSES ONLY   Protime-INR     Status: None   Collection Time: 06/21/16  4:59 PM  Result Value Ref Range   Prothrombin Time 13.5 11.4 - 15.2 seconds   INR 1.03   Type and screen     Status: None (Preliminary result)   Collection Time: 06/21/16  5:00 PM  Result Value Ref Range   ABO/RH(D) B POS    Antibody Screen NEG    Sample Expiration 06/24/2016    Unit Number [id number]    Blood Component Type RBC CPDA1, LR    Unit division 00    Status of Unit ISSUED    Unit tag comment VERBAL ORDERS PER DR CARDAMA    Transfusion Status OK TO TRANSFUSE    Crossmatch Result PENDING    Unit Number [id number]    Blood Component Type RED CELLS,LR    Unit division 00    Status of Unit ISSUED    Unit tag comment VERBAL ORDERS PER DR CARADAMA    Transfusion Status OK TO TRANSFUSE    Crossmatch Result PENDING   ABO/Rh     Status: None (Preliminary result)   Collection Time: 06/21/16  5:00 PM  Result Value Ref Range   ABO/RH(D) B POS   I-Stat Chem 8, ED     Status: Abnormal   Collection Time: 06/21/16  5:05 PM  Result Value Ref Range   Sodium 140 135 - 145 mmol/L   Potassium 3.8 3.5 - 5.1 mmol/L   Chloride  103 101 - 111 mmol/L   BUN 17 6 - 20 mg/dL   Creatinine, Ser 1.20 0.61 - 1.24 mg/dL   Glucose, Bld 159 (H) 65 - 99 mg/dL   Calcium, Ion 1.08 (L) 1.15 - 1.40 mmol/L   TCO2 26 0 - 100 mmol/L   Hemoglobin 16.3 13.0 - 17.0 g/dL   HCT 48.0 39.0 - 52.0 %  I-Stat CG4 Lactic Acid, ED     Status: Abnormal   Collection Time: 06/21/16  5:06 PM  Result Value Ref Range   Lactic Acid, Venous 2.44 (HH) 0.5 - 1.9 mmol/L   Comment NOTIFIED PHYSICIAN    Ct Head Wo Contrast  Result Date: 06/21/2016 CLINICAL DATA:  Found on ground near ladder. Patient unresponsive. Concern for head, maxillofacial or cervical spine injury. Initial encounter. EXAM: CT HEAD WITHOUT CONTRAST CT MAXILLOFACIAL WITHOUT CONTRAST CT CERVICAL SPINE WITHOUT CONTRAST TECHNIQUE: Multidetector CT imaging of the head, cervical spine, and maxillofacial structures were performed using the standard protocol without intravenous contrast. Multiplanar CT image reconstructions of the cervical spine and maxillofacial structures were also generated. COMPARISON:  None. FINDINGS: CT HEAD FINDINGS Brain: There appears to be a small amount of subarachnoid hemorrhage along the sylvian fissures bilaterally, and at the high left parietal lobe and right frontal lobe. No evidence of acute infarction, hemorrhage, hydrocephalus, extra-axial collection or mass lesion/mass effect. Prominence of the ventricles and sulci reflects mild cortical volume loss. Mild cerebellar atrophy is noted. The brainstem and fourth ventricle are within normal limits. The basal ganglia are unremarkable in appearance. The cerebral hemispheres demonstrate grossly normal gray-white differentiation. No mass effect or midline shift is seen. Vascular: No hyperdense vessel or unexpected calcification. Skull: There is no evidence of fracture; visualized osseous structures are unremarkable in appearance. Other: Mild soft tissue swelling is noted lateral to the right orbit. CT MAXILLOFACIAL FINDINGS  Osseous: There is no evidence of fracture or dislocation. The maxilla and  mandible appear intact. The nasal bone is unremarkable in appearance. The visualized dentition demonstrates no acute abnormality. Orbits: The orbits are intact bilaterally. Sinuses: Mucosal thickening is noted at the left maxillary sinus. The remaining visualized paranasal sinuses and mastoid air cells are well-aerated. Soft tissues: Soft tissue swelling is noted along the upper lip. Mild soft tissue injury is suggested at the anterior vertex. The parapharyngeal fat planes are preserved. The nasopharynx, oropharynx and hypopharynx are unremarkable in appearance. The visualized portions of the valleculae and piriform sinuses are grossly unremarkable. The parotid and submandibular glands are within normal limits. No cervical lymphadenopathy is seen. CT CERVICAL SPINE FINDINGS Alignment: Normal. Skull base and vertebrae: No acute fracture. No primary bone lesion or focal pathologic process. There is a displaced fracture through the right mid clavicle. Soft tissues and spinal canal: No prevertebral fluid or swelling. No visible canal hematoma. Disc levels: Intervertebral disc spaces are preserved. Mild facet disease is noted at the mid cervical spine. Mild degenerative change is noted about the dens. Upper chest: Emphysema is noted at the lung apices. The thyroid gland is unremarkable in appearance. Mild calcification is noted at the left carotid bifurcation. Other: No additional soft tissue abnormalities are seen. IMPRESSION: 1. Mild acute subarachnoid hemorrhage along the sylvian fissures bilaterally, at the high left parietal lobe and right right frontal lobe. 2. No evidence of fracture or subluxation along the cervical spine. 3. Displaced fracture through the right mid clavicle. 4. No evidence of fracture or dislocation with regard to the maxillofacial structures. 5. Soft tissue swelling along the upper lip. Mild soft tissue injury suggested  at the anterior vertex. Mild soft tissue swelling lateral to the right orbit. 6. Mild cortical volume loss. 7. Emphysema at the lung apices. 8. Mild calcification at the left carotid bifurcation. Critical Value/emergent results were called by telephone at the time of interpretation on 06/21/2016 at 6:12 pm to Dr. Ninfa Linden, who verbally acknowledged these results. Electronically Signed   By: Garald Balding M.D.   On: 06/21/2016 18:17   Ct Cervical Spine Wo Contrast  Result Date: 06/21/2016 CLINICAL DATA:  Found on ground near ladder. Patient unresponsive. Concern for head, maxillofacial or cervical spine injury. Initial encounter. EXAM: CT HEAD WITHOUT CONTRAST CT MAXILLOFACIAL WITHOUT CONTRAST CT CERVICAL SPINE WITHOUT CONTRAST TECHNIQUE: Multidetector CT imaging of the head, cervical spine, and maxillofacial structures were performed using the standard protocol without intravenous contrast. Multiplanar CT image reconstructions of the cervical spine and maxillofacial structures were also generated. COMPARISON:  None. FINDINGS: CT HEAD FINDINGS Brain: There appears to be a small amount of subarachnoid hemorrhage along the sylvian fissures bilaterally, and at the high left parietal lobe and right frontal lobe. No evidence of acute infarction, hemorrhage, hydrocephalus, extra-axial collection or mass lesion/mass effect. Prominence of the ventricles and sulci reflects mild cortical volume loss. Mild cerebellar atrophy is noted. The brainstem and fourth ventricle are within normal limits. The basal ganglia are unremarkable in appearance. The cerebral hemispheres demonstrate grossly normal gray-white differentiation. No mass effect or midline shift is seen. Vascular: No hyperdense vessel or unexpected calcification. Skull: There is no evidence of fracture; visualized osseous structures are unremarkable in appearance. Other: Mild soft tissue swelling is noted lateral to the right orbit. CT MAXILLOFACIAL FINDINGS  Osseous: There is no evidence of fracture or dislocation. The maxilla and mandible appear intact. The nasal bone is unremarkable in appearance. The visualized dentition demonstrates no acute abnormality. Orbits: The orbits are intact bilaterally. Sinuses: Mucosal thickening is noted  at the left maxillary sinus. The remaining visualized paranasal sinuses and mastoid air cells are well-aerated. Soft tissues: Soft tissue swelling is noted along the upper lip. Mild soft tissue injury is suggested at the anterior vertex. The parapharyngeal fat planes are preserved. The nasopharynx, oropharynx and hypopharynx are unremarkable in appearance. The visualized portions of the valleculae and piriform sinuses are grossly unremarkable. The parotid and submandibular glands are within normal limits. No cervical lymphadenopathy is seen. CT CERVICAL SPINE FINDINGS Alignment: Normal. Skull base and vertebrae: No acute fracture. No primary bone lesion or focal pathologic process. There is a displaced fracture through the right mid clavicle. Soft tissues and spinal canal: No prevertebral fluid or swelling. No visible canal hematoma. Disc levels: Intervertebral disc spaces are preserved. Mild facet disease is noted at the mid cervical spine. Mild degenerative change is noted about the dens. Upper chest: Emphysema is noted at the lung apices. The thyroid gland is unremarkable in appearance. Mild calcification is noted at the left carotid bifurcation. Other: No additional soft tissue abnormalities are seen. IMPRESSION: 1. Mild acute subarachnoid hemorrhage along the sylvian fissures bilaterally, at the high left parietal lobe and right right frontal lobe. 2. No evidence of fracture or subluxation along the cervical spine. 3. Displaced fracture through the right mid clavicle. 4. No evidence of fracture or dislocation with regard to the maxillofacial structures. 5. Soft tissue swelling along the upper lip. Mild soft tissue injury suggested  at the anterior vertex. Mild soft tissue swelling lateral to the right orbit. 6. Mild cortical volume loss. 7. Emphysema at the lung apices. 8. Mild calcification at the left carotid bifurcation. Critical Value/emergent results were called by telephone at the time of interpretation on 06/21/2016 at 6:12 pm to Dr. Ninfa Linden, who verbally acknowledged these results. Electronically Signed   By: Garald Balding M.D.   On: 06/21/2016 18:17   Dg Pelvis Portable  Result Date: 06/21/2016 CLINICAL DATA:  66 year old who fell from a 10 foot ladder earlier today. Initial encounter. EXAM: PORTABLE PELVIS 1-2 VIEWS COMPARISON:  None. FINDINGS: Comminuted, mildly displaced fracture involving the right superior pubic ramus. No other visible fractures. Sacroiliac joints and symphysis pubis intact without evidence of diastasis. Benign bone island in the roof of the right acetabulum. IMPRESSION: Acute traumatic mildly displaced fracture involving the right superior pubic ramus. Electronically Signed   By: Evangeline Dakin M.D.   On: 06/21/2016 17:19   Dg Chest Port 1 View  Result Date: 06/21/2016 CLINICAL DATA:  66 year old who fell from a 10 foot ladder earlier today. Initial encounter. EXAM: PORTABLE CHEST 1 VIEW COMPARISON:  None. FINDINGS: Suboptimal inspiration accounts for crowded bronchovascular markings, especially in the bases, and accentuates the cardiac silhouette. Taking this into account, cardiac silhouette upper normal in size. Thoracic aorta mildly atherosclerotic. Slight widening of the superior mediastinum. Linear atelectasis in the right mid lung. Linear scar or atelectasis in the left mid lung. Comminuted fracture involving the right lateral second rib. No visible pneumothorax. IMPRESSION: 1. Suboptimal inspiration. Linear atelectasis in the right mid lung. Linear atelectasis or scarring left mid lung. 2. Comminuted fracture involving the right lateral second rib. No visible pneumothorax. 3. Slight widening of  the mediastinum, though this may just be positional and related to the AP portable technique. I telephoned these results at the time of interpretation on 06/21/2016 at 5:22 pm to Dr. Lennice Sites, who verbally acknowledged these results. Electronically Signed   By: Evangeline Dakin M.D.   On: 06/21/2016 17:26  Ct Maxillofacial Wo Cm  Result Date: 06/21/2016 CLINICAL DATA:  Found on ground near ladder. Patient unresponsive. Concern for head, maxillofacial or cervical spine injury. Initial encounter. EXAM: CT HEAD WITHOUT CONTRAST CT MAXILLOFACIAL WITHOUT CONTRAST CT CERVICAL SPINE WITHOUT CONTRAST TECHNIQUE: Multidetector CT imaging of the head, cervical spine, and maxillofacial structures were performed using the standard protocol without intravenous contrast. Multiplanar CT image reconstructions of the cervical spine and maxillofacial structures were also generated. COMPARISON:  None. FINDINGS: CT HEAD FINDINGS Brain: There appears to be a small amount of subarachnoid hemorrhage along the sylvian fissures bilaterally, and at the high left parietal lobe and right frontal lobe. No evidence of acute infarction, hemorrhage, hydrocephalus, extra-axial collection or mass lesion/mass effect. Prominence of the ventricles and sulci reflects mild cortical volume loss. Mild cerebellar atrophy is noted. The brainstem and fourth ventricle are within normal limits. The basal ganglia are unremarkable in appearance. The cerebral hemispheres demonstrate grossly normal gray-white differentiation. No mass effect or midline shift is seen. Vascular: No hyperdense vessel or unexpected calcification. Skull: There is no evidence of fracture; visualized osseous structures are unremarkable in appearance. Other: Mild soft tissue swelling is noted lateral to the right orbit. CT MAXILLOFACIAL FINDINGS Osseous: There is no evidence of fracture or dislocation. The maxilla and mandible appear intact. The nasal bone is unremarkable in  appearance. The visualized dentition demonstrates no acute abnormality. Orbits: The orbits are intact bilaterally. Sinuses: Mucosal thickening is noted at the left maxillary sinus. The remaining visualized paranasal sinuses and mastoid air cells are well-aerated. Soft tissues: Soft tissue swelling is noted along the upper lip. Mild soft tissue injury is suggested at the anterior vertex. The parapharyngeal fat planes are preserved. The nasopharynx, oropharynx and hypopharynx are unremarkable in appearance. The visualized portions of the valleculae and piriform sinuses are grossly unremarkable. The parotid and submandibular glands are within normal limits. No cervical lymphadenopathy is seen. CT CERVICAL SPINE FINDINGS Alignment: Normal. Skull base and vertebrae: No acute fracture. No primary bone lesion or focal pathologic process. There is a displaced fracture through the right mid clavicle. Soft tissues and spinal canal: No prevertebral fluid or swelling. No visible canal hematoma. Disc levels: Intervertebral disc spaces are preserved. Mild facet disease is noted at the mid cervical spine. Mild degenerative change is noted about the dens. Upper chest: Emphysema is noted at the lung apices. The thyroid gland is unremarkable in appearance. Mild calcification is noted at the left carotid bifurcation. Other: No additional soft tissue abnormalities are seen. IMPRESSION: 1. Mild acute subarachnoid hemorrhage along the sylvian fissures bilaterally, at the high left parietal lobe and right right frontal lobe. 2. No evidence of fracture or subluxation along the cervical spine. 3. Displaced fracture through the right mid clavicle. 4. No evidence of fracture or dislocation with regard to the maxillofacial structures. 5. Soft tissue swelling along the upper lip. Mild soft tissue injury suggested at the anterior vertex. Mild soft tissue swelling lateral to the right orbit. 6. Mild cortical volume loss. 7. Emphysema at the lung  apices. 8. Mild calcification at the left carotid bifurcation. Critical Value/emergent results were called by telephone at the time of interpretation on 06/21/2016 at 6:12 pm to Dr. Ninfa Linden, who verbally acknowledged these results. Electronically Signed   By: Garald Balding M.D.   On: 06/21/2016 18:17    Review of Systems  Unable to perform ROS: Patient nonverbal    Blood pressure 110/70, pulse 75, temperature 98.5 F (36.9 C), temperature source Temporal, resp. rate Marland Kitchen)  25, SpO2 95 %. Physical Exam  Constitutional: He appears well-developed and well-nourished.  Appear somnolent but comfortable  HENT:  Head: Normocephalic.  Right Ear: External ear normal.  Left Ear: External ear normal.  Nose: Nose normal.  Mouth/Throat: Oropharynx is clear and moist. No oropharyngeal exudate.  Minimal abrasions to the scalp and face  Eyes: Pupils are equal, round, and reactive to light. Right eye exhibits no discharge. Left eye exhibits no discharge. No scleral icterus.  Neck: No tracheal deviation present.  C-collar in place. No palpable step-off  Cardiovascular: Normal rate, regular rhythm, normal heart sounds and intact distal pulses.   No murmur heard. Respiratory: Effort normal and breath sounds normal. No respiratory distress.  GI: Soft. He exhibits no distension. There is no tenderness. There is no guarding.  Musculoskeletal: Normal range of motion. He exhibits no deformity.  Slightly palpable defect of the right clavicle and abrasion on the right shoulder  Neurological:  Somnolent. He will move all 4 extremities and will follow commands but is nonverbal.   he does make a few sounds  Skin: Skin is warm and dry. He is not diaphoretic. No erythema.     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 Right clavicle fracture Right superior rami pelvic fracture  The rest of his workup is in progress.  Dr. Vertell Limber from neurosurgery has evaluated the patient. The orthopedic surgeon on call has also been notified of the patient's injuries. He will be admitted to the ICU and a CT scan will be repeated in the morning. He will remain in a c-collar for now. I'm awaiting the final read from radiology on the rest of his scans.  Addendum:  Patient also has multiple right posterior rib fractures and a right sacrum fracture going to the SI joint without a pelvic hematoma  Shenequa Howse A 06/21/2016, 6:49 PM   Procedures

## 2016-06-21 NOTE — ED Provider Notes (Signed)
  I have personally seen and examined the patient. I have reviewed the documentation on PMH/FH/Soc Hx. I have discussed the plan of care with the resident and patient.  I have reviewed and agree with the resident's documentation. Please see associated encounter note.  Briefly patient is a 66 year old male with no known past medical history who was found down next to a 10 foot ladder minimally responsive. Initial GCS of 8 by EMS, which seemed to improve in route. Given the GCS patient was a level I trauma. ABCs intact. Secondary exam remarkable for right-sided face, shoulder contusions as well as likely a right clavicle fracture. Chest x-ray with likely right rib fracture and plain film of the pelvis with by mouth and a Rami fracture. Full trauma workup obtained which revealed traumatic subarachnoid hemorrhage, and confirm the right clavicle, right rib and right pelvic fractures. No other injuries noted on exam. Patient admitted to trauma surgery to consulted neural surgery.  CRITICAL CARE Performed by: Amadeo GarnetPedro Eduardo Riki Gehring Total critical care time: 35 minutes Critical care time was exclusive of separately billable procedures and treating other patients. Critical care was necessary to treat or prevent imminent or life-threatening deterioration. Critical care was time spent personally by me on the following activities: development of treatment plan with patient and/or surrogate as well as nursing, discussions with consultants, evaluation of patient's response to treatment, examination of patient, obtaining history from patient or surrogate, ordering and performing treatments and interventions, ordering and review of laboratory studies, ordering and review of radiographic studies, pulse oximetry and re-evaluation of patient's condition.     Nira Connardama, Phi Avans Eduardo, MD 06/21/16 [phone]

## 2016-06-21 NOTE — Progress Notes (Signed)
   06/21/16 1705  Clinical Encounter Type  Visited With Health care provider  Visit Type ED  Spiritual Encounters  Spiritual Needs Emotional  Stress Factors  Patient Stress Factors Not reviewed  Pt at CT. Family had been contacted by nursing staff. Await page if requested.

## 2016-06-21 NOTE — ED Notes (Addendum)
Returned from CT   Pt non-verbal, moving all extremities.  Will not answer any questions.

## 2016-06-21 NOTE — Consult Note (Addendum)
Reason for Consult:fall from ladder with head injury Referring Physician: [name] is an 66 y.o. male.  HPI: Patient was found down beside a 10 foot ladder.  On arrival, he had GCS reportedly of 8.  He was hemodynamically stable.    No past medical history on file.  No past surgical history on file.  No family history on file.  Social History:  has no tobacco, alcohol, and drug history on file.  Allergies: Allergies not on file  Medications: I have reviewed the patient's current medications.  Results for orders placed or performed during the hospital encounter of 06/21/16 (from the past 48 hour(s))  Prepare fresh frozen plasma     Status: None (Preliminary result)   Collection Time: 06/21/16  4:38 PM  Result Value Ref Range   Unit Number [id number]    Blood Component Type LIQ PLASMA    Unit division 00    Status of Unit ISSUED    Unit tag comment VERBAL ORDERS PER DR [facility]    Transfusion Status OK TO TRANSFUSE    Unit Number [id number]    Blood Component Type LIQ PLASMA    Unit division 00    Status of Unit ISSUED    Unit tag comment VERBAL ORDERS PER DR [facility]    Transfusion Status OK TO TRANSFUSE   Comprehensive metabolic panel     Status: Abnormal   Collection Time: 06/21/16  4:59 PM  Result Value Ref Range   Sodium 138 135 - 145 mmol/L   Potassium 3.9 3.5 - 5.1 mmol/L   Chloride 103 101 - 111 mmol/L   CO2 23 22 - 32 mmol/L   Glucose, Bld 167 (H) 65 - 99 mg/dL   BUN 15 6 - 20 mg/dL   Creatinine, Ser 1.31 (H) 0.61 - 1.24 mg/dL   Calcium 9.3 8.9 - 10.3 mg/dL   Total Protein 7.0 6.5 - 8.1 g/dL   Albumin 4.1 3.5 - 5.0 g/dL   AST 66 (H) 15 - 41 U/L   ALT 59 17 - 63 U/L   Alkaline Phosphatase 89 38 - 126 U/L   Total Bilirubin 1.1 0.3 - 1.2 mg/dL   GFR calc non Af Amer 56 (L) >60 mL/min   GFR calc Af Amer >60 >60 mL/min    Comment: (NOTE) The eGFR has been calculated using the CKD EPI equation. This calculation has not been validated in all  clinical situations. eGFR's persistently <60 mL/min signify possible Chronic Kidney Disease.    Anion gap 12 5 - 15  CBC     Status: Abnormal   Collection Time: 06/21/16  4:59 PM  Result Value Ref Range   WBC 26.3 (H) 4.0 - 10.5 K/uL   RBC 5.08 4.22 - 5.81 MIL/uL   Hemoglobin 16.6 13.0 - 17.0 g/dL   HCT 47.8 39.0 - 52.0 %   MCV 94.1 78.0 - 100.0 fL   MCH 32.7 26.0 - 34.0 pg   MCHC 34.7 30.0 - 36.0 g/dL   RDW 13.6 11.5 - 15.5 %   Platelets 229 150 - 400 K/uL  Ethanol     Status: None   Collection Time: 06/21/16  4:59 PM  Result Value Ref Range   Alcohol, Ethyl (B) <5 <5 mg/dL    Comment:        LOWEST DETECTABLE LIMIT FOR SERUM ALCOHOL IS 5 mg/dL FOR MEDICAL PURPOSES ONLY   Protime-INR     Status: None   Collection Time: 06/21/16  4:59  PM  Result Value Ref Range   Prothrombin Time 13.5 11.4 - 15.2 seconds   INR 1.03   Type and screen     Status: None (Preliminary result)   Collection Time: 06/21/16  5:00 PM  Result Value Ref Range   ABO/RH(D) B POS    Antibody Screen NEG    Sample Expiration 06/24/2016    Unit Number [id number]    Blood Component Type RBC CPDA1, LR    Unit division 00    Status of Unit ISSUED    Unit tag comment VERBAL ORDERS PER DR CARDAMA    Transfusion Status OK TO TRANSFUSE    Crossmatch Result PENDING    Unit Number [id number]    Blood Component Type RED CELLS,LR    Unit division 00    Status of Unit ISSUED    Unit tag comment VERBAL ORDERS PER DR Waldemar Dickens    Transfusion Status OK TO TRANSFUSE    Crossmatch Result PENDING   ABO/Rh     Status: None (Preliminary result)   Collection Time: 06/21/16  5:00 PM  Result Value Ref Range   ABO/RH(D) B POS   I-Stat Chem 8, ED     Status: Abnormal   Collection Time: 06/21/16  5:05 PM  Result Value Ref Range   Sodium 140 135 - 145 mmol/L   Potassium 3.8 3.5 - 5.1 mmol/L   Chloride 103 101 - 111 mmol/L   BUN 17 6 - 20 mg/dL   Creatinine, Ser 1.20 0.61 - 1.24 mg/dL   Glucose, Bld 159 (H) 65 -  99 mg/dL   Calcium, Ion 1.08 (L) 1.15 - 1.40 mmol/L   TCO2 26 0 - 100 mmol/L   Hemoglobin 16.3 13.0 - 17.0 g/dL   HCT 48.0 39.0 - 52.0 %  I-Stat CG4 Lactic Acid, ED     Status: Abnormal   Collection Time: 06/21/16  5:06 PM  Result Value Ref Range   Lactic Acid, Venous 2.44 (HH) 0.5 - 1.9 mmol/L   Comment NOTIFIED PHYSICIAN     Ct Head Wo Contrast  Result Date: 06/21/2016 CLINICAL DATA:  Found on ground near ladder. Patient unresponsive. Concern for head, maxillofacial or cervical spine injury. Initial encounter. EXAM: CT HEAD WITHOUT CONTRAST CT MAXILLOFACIAL WITHOUT CONTRAST CT CERVICAL SPINE WITHOUT CONTRAST TECHNIQUE: Multidetector CT imaging of the head, cervical spine, and maxillofacial structures were performed using the standard protocol without intravenous contrast. Multiplanar CT image reconstructions of the cervical spine and maxillofacial structures were also generated. COMPARISON:  None. FINDINGS: CT HEAD FINDINGS Brain: There appears to be a small amount of subarachnoid hemorrhage along the sylvian fissures bilaterally, and at the high left parietal lobe and right frontal lobe. No evidence of acute infarction, hemorrhage, hydrocephalus, extra-axial collection or mass lesion/mass effect. Prominence of the ventricles and sulci reflects mild cortical volume loss. Mild cerebellar atrophy is noted. The brainstem and fourth ventricle are within normal limits. The basal ganglia are unremarkable in appearance. The cerebral hemispheres demonstrate grossly normal gray-white differentiation. No mass effect or midline shift is seen. Vascular: No hyperdense vessel or unexpected calcification. Skull: There is no evidence of fracture; visualized osseous structures are unremarkable in appearance. Other: Mild soft tissue swelling is noted lateral to the right orbit. CT MAXILLOFACIAL FINDINGS Osseous: There is no evidence of fracture or dislocation. The maxilla and mandible appear intact. The nasal bone is  unremarkable in appearance. The visualized dentition demonstrates no acute abnormality. Orbits: The orbits are intact bilaterally. Sinuses: Mucosal thickening is  noted at the left maxillary sinus. The remaining visualized paranasal sinuses and mastoid air cells are well-aerated. Soft tissues: Soft tissue swelling is noted along the upper lip. Mild soft tissue injury is suggested at the anterior vertex. The parapharyngeal fat planes are preserved. The nasopharynx, oropharynx and hypopharynx are unremarkable in appearance. The visualized portions of the valleculae and piriform sinuses are grossly unremarkable. The parotid and submandibular glands are within normal limits. No cervical lymphadenopathy is seen. CT CERVICAL SPINE FINDINGS Alignment: Normal. Skull base and vertebrae: No acute fracture. No primary bone lesion or focal pathologic process. There is a displaced fracture through the right mid clavicle. Soft tissues and spinal canal: No prevertebral fluid or swelling. No visible canal hematoma. Disc levels: Intervertebral disc spaces are preserved. Mild facet disease is noted at the mid cervical spine. Mild degenerative change is noted about the dens. Upper chest: Emphysema is noted at the lung apices. The thyroid gland is unremarkable in appearance. Mild calcification is noted at the left carotid bifurcation. Other: No additional soft tissue abnormalities are seen. IMPRESSION: 1. Mild acute subarachnoid hemorrhage along the sylvian fissures bilaterally, at the high left parietal lobe and right right frontal lobe. 2. No evidence of fracture or subluxation along the cervical spine. 3. Displaced fracture through the right mid clavicle. 4. No evidence of fracture or dislocation with regard to the maxillofacial structures. 5. Soft tissue swelling along the upper lip. Mild soft tissue injury suggested at the anterior vertex. Mild soft tissue swelling lateral to the right orbit. 6. Mild cortical volume loss. 7.  Emphysema at the lung apices. 8. Mild calcification at the left carotid bifurcation. Critical Value/emergent results were called by telephone at the time of interpretation on 06/21/2016 at 6:12 pm to Dr. Ninfa Linden, who verbally acknowledged these results. Electronically Signed   By: Garald Balding M.D.   On: 06/21/2016 18:17   Ct Cervical Spine Wo Contrast  Result Date: 06/21/2016 CLINICAL DATA:  Found on ground near ladder. Patient unresponsive. Concern for head, maxillofacial or cervical spine injury. Initial encounter. EXAM: CT HEAD WITHOUT CONTRAST CT MAXILLOFACIAL WITHOUT CONTRAST CT CERVICAL SPINE WITHOUT CONTRAST TECHNIQUE: Multidetector CT imaging of the head, cervical spine, and maxillofacial structures were performed using the standard protocol without intravenous contrast. Multiplanar CT image reconstructions of the cervical spine and maxillofacial structures were also generated. COMPARISON:  None. FINDINGS: CT HEAD FINDINGS Brain: There appears to be a small amount of subarachnoid hemorrhage along the sylvian fissures bilaterally, and at the high left parietal lobe and right frontal lobe. No evidence of acute infarction, hemorrhage, hydrocephalus, extra-axial collection or mass lesion/mass effect. Prominence of the ventricles and sulci reflects mild cortical volume loss. Mild cerebellar atrophy is noted. The brainstem and fourth ventricle are within normal limits. The basal ganglia are unremarkable in appearance. The cerebral hemispheres demonstrate grossly normal gray-white differentiation. No mass effect or midline shift is seen. Vascular: No hyperdense vessel or unexpected calcification. Skull: There is no evidence of fracture; visualized osseous structures are unremarkable in appearance. Other: Mild soft tissue swelling is noted lateral to the right orbit. CT MAXILLOFACIAL FINDINGS Osseous: There is no evidence of fracture or dislocation. The maxilla and mandible appear intact. The nasal bone is  unremarkable in appearance. The visualized dentition demonstrates no acute abnormality. Orbits: The orbits are intact bilaterally. Sinuses: Mucosal thickening is noted at the left maxillary sinus. The remaining visualized paranasal sinuses and mastoid air cells are well-aerated. Soft tissues: Soft tissue swelling is noted along the upper lip.  Mild soft tissue injury is suggested at the anterior vertex. The parapharyngeal fat planes are preserved. The nasopharynx, oropharynx and hypopharynx are unremarkable in appearance. The visualized portions of the valleculae and piriform sinuses are grossly unremarkable. The parotid and submandibular glands are within normal limits. No cervical lymphadenopathy is seen. CT CERVICAL SPINE FINDINGS Alignment: Normal. Skull base and vertebrae: No acute fracture. No primary bone lesion or focal pathologic process. There is a displaced fracture through the right mid clavicle. Soft tissues and spinal canal: No prevertebral fluid or swelling. No visible canal hematoma. Disc levels: Intervertebral disc spaces are preserved. Mild facet disease is noted at the mid cervical spine. Mild degenerative change is noted about the dens. Upper chest: Emphysema is noted at the lung apices. The thyroid gland is unremarkable in appearance. Mild calcification is noted at the left carotid bifurcation. Other: No additional soft tissue abnormalities are seen. IMPRESSION: 1. Mild acute subarachnoid hemorrhage along the sylvian fissures bilaterally, at the high left parietal lobe and right right frontal lobe. 2. No evidence of fracture or subluxation along the cervical spine. 3. Displaced fracture through the right mid clavicle. 4. No evidence of fracture or dislocation with regard to the maxillofacial structures. 5. Soft tissue swelling along the upper lip. Mild soft tissue injury suggested at the anterior vertex. Mild soft tissue swelling lateral to the right orbit. 6. Mild cortical volume loss. 7.  Emphysema at the lung apices. 8. Mild calcification at the left carotid bifurcation. Critical Value/emergent results were called by telephone at the time of interpretation on 06/21/2016 at 6:12 pm to Dr. Ninfa Linden, who verbally acknowledged these results. Electronically Signed   By: Garald Balding M.D.   On: 06/21/2016 18:17   Dg Pelvis Portable  Result Date: 06/21/2016 CLINICAL DATA:  66 year old who fell from a 10 foot ladder earlier today. Initial encounter. EXAM: PORTABLE PELVIS 1-2 VIEWS COMPARISON:  None. FINDINGS: Comminuted, mildly displaced fracture involving the right superior pubic ramus. No other visible fractures. Sacroiliac joints and symphysis pubis intact without evidence of diastasis. Benign bone island in the roof of the right acetabulum. IMPRESSION: Acute traumatic mildly displaced fracture involving the right superior pubic ramus. Electronically Signed   By: Evangeline Dakin M.D.   On: 06/21/2016 17:19   Dg Chest Port 1 View  Result Date: 06/21/2016 CLINICAL DATA:  66 year old who fell from a 10 foot ladder earlier today. Initial encounter. EXAM: PORTABLE CHEST 1 VIEW COMPARISON:  None. FINDINGS: Suboptimal inspiration accounts for crowded bronchovascular markings, especially in the bases, and accentuates the cardiac silhouette. Taking this into account, cardiac silhouette upper normal in size. Thoracic aorta mildly atherosclerotic. Slight widening of the superior mediastinum. Linear atelectasis in the right mid lung. Linear scar or atelectasis in the left mid lung. Comminuted fracture involving the right lateral second rib. No visible pneumothorax. IMPRESSION: 1. Suboptimal inspiration. Linear atelectasis in the right mid lung. Linear atelectasis or scarring left mid lung. 2. Comminuted fracture involving the right lateral second rib. No visible pneumothorax. 3. Slight widening of the mediastinum, though this may just be positional and related to the AP portable technique. I telephoned  these results at the time of interpretation on 06/21/2016 at 5:22 pm to Dr. Lennice Sites, who verbally acknowledged these results. Electronically Signed   By: Evangeline Dakin M.D.   On: 06/21/2016 17:26   Ct Maxillofacial Wo Cm  Result Date: 06/21/2016 CLINICAL DATA:  Found on ground near ladder. Patient unresponsive. Concern for head, maxillofacial or cervical spine injury. Initial  encounter. EXAM: CT HEAD WITHOUT CONTRAST CT MAXILLOFACIAL WITHOUT CONTRAST CT CERVICAL SPINE WITHOUT CONTRAST TECHNIQUE: Multidetector CT imaging of the head, cervical spine, and maxillofacial structures were performed using the standard protocol without intravenous contrast. Multiplanar CT image reconstructions of the cervical spine and maxillofacial structures were also generated. COMPARISON:  None. FINDINGS: CT HEAD FINDINGS Brain: There appears to be a small amount of subarachnoid hemorrhage along the sylvian fissures bilaterally, and at the high left parietal lobe and right frontal lobe. No evidence of acute infarction, hemorrhage, hydrocephalus, extra-axial collection or mass lesion/mass effect. Prominence of the ventricles and sulci reflects mild cortical volume loss. Mild cerebellar atrophy is noted. The brainstem and fourth ventricle are within normal limits. The basal ganglia are unremarkable in appearance. The cerebral hemispheres demonstrate grossly normal gray-white differentiation. No mass effect or midline shift is seen. Vascular: No hyperdense vessel or unexpected calcification. Skull: There is no evidence of fracture; visualized osseous structures are unremarkable in appearance. Other: Mild soft tissue swelling is noted lateral to the right orbit. CT MAXILLOFACIAL FINDINGS Osseous: There is no evidence of fracture or dislocation. The maxilla and mandible appear intact. The nasal bone is unremarkable in appearance. The visualized dentition demonstrates no acute abnormality. Orbits: The orbits are intact bilaterally.  Sinuses: Mucosal thickening is noted at the left maxillary sinus. The remaining visualized paranasal sinuses and mastoid air cells are well-aerated. Soft tissues: Soft tissue swelling is noted along the upper lip. Mild soft tissue injury is suggested at the anterior vertex. The parapharyngeal fat planes are preserved. The nasopharynx, oropharynx and hypopharynx are unremarkable in appearance. The visualized portions of the valleculae and piriform sinuses are grossly unremarkable. The parotid and submandibular glands are within normal limits. No cervical lymphadenopathy is seen. CT CERVICAL SPINE FINDINGS Alignment: Normal. Skull base and vertebrae: No acute fracture. No primary bone lesion or focal pathologic process. There is a displaced fracture through the right mid clavicle. Soft tissues and spinal canal: No prevertebral fluid or swelling. No visible canal hematoma. Disc levels: Intervertebral disc spaces are preserved. Mild facet disease is noted at the mid cervical spine. Mild degenerative change is noted about the dens. Upper chest: Emphysema is noted at the lung apices. The thyroid gland is unremarkable in appearance. Mild calcification is noted at the left carotid bifurcation. Other: No additional soft tissue abnormalities are seen. IMPRESSION: 1. Mild acute subarachnoid hemorrhage along the sylvian fissures bilaterally, at the high left parietal lobe and right right frontal lobe. 2. No evidence of fracture or subluxation along the cervical spine. 3. Displaced fracture through the right mid clavicle. 4. No evidence of fracture or dislocation with regard to the maxillofacial structures. 5. Soft tissue swelling along the upper lip. Mild soft tissue injury suggested at the anterior vertex. Mild soft tissue swelling lateral to the right orbit. 6. Mild cortical volume loss. 7. Emphysema at the lung apices. 8. Mild calcification at the left carotid bifurcation. Critical Value/emergent results were called by  telephone at the time of interpretation on 06/21/2016 at 6:12 pm to Dr. Ninfa Linden, who verbally acknowledged these results. Electronically Signed   By: Garald Balding M.D.   On: 06/21/2016 18:17    Review of Systems - Negative except as above    Blood pressure 128/84, pulse 79, temperature 98.5 F (36.9 C), temperature source Temporal, resp. rate (!) 21, SpO2 94 %. Physical Exam  Constitutional: He appears well-developed and well-nourished.  HENT:  Head: Normocephalic. Head is with abrasion and with contusion.  Eyes: EOM  are normal. Pupils are equal, round, and reactive to light.  Patient is uncooperative with exam.  He will track light and will follow commands to repeated requests.  Neck:  No palpable stepoffs.  In cervical collar.  Neurological: He is alert. He has normal strength and normal reflexes. No cranial nerve deficit or sensory deficit. GCS eye subscore is 4. GCS verbal subscore is 3. GCS motor subscore is 6.  Patient is non-verbal,  He states "OK" to questions, but will not state his name or speak spontaneously.  He will follow commands in all 4 extremities and nods yes and no to some questions.    Assessment/Plan: Unwitnessed fall from ladder.  Subarachnoid hemorrhage on CT scan.  Aphasic, suggestive of CVA, but no lateralizing findings on exam or suggestion of acute stroke on CT.  Agree with plan to admit to ICU and observe patient.  Repeat Head CT in am.  If exam does not improve, will obtain brain MRI to assess for stroke. Patient's aphasia may be secondary to seizure, although no seizure was witnessed.  Anticonvulsants if witnessed seizures.  On repeat exam, he is beginning to speak and answering questions, says name is "Tyler Furl, MD 06/21/2016, 6:56 PM

## 2016-06-21 NOTE — Progress Notes (Signed)
Orthopedic Tech Progress Note Patient Details:  Tyler AskewDavid Lane 1950/05/26 [id number]  Patient ID: Tyler Askewavid Staffa, male   DOB: 1950/05/26, 66 y.o.   MRN: [id number]   Saul FordyceJennifer C Marshun Duva 06/21/2016, 5:17 PMLevel one Trauma.

## 2016-06-21 NOTE — ED Notes (Signed)
Pt remains non-verbal.  Will follow simple commands

## 2016-06-21 NOTE — ED Notes (Signed)
Patient transported to CT 

## 2016-06-22 ENCOUNTER — Inpatient Hospital Stay (HOSPITAL_COMMUNITY): Payer: Medicare HMO

## 2016-06-22 DIAGNOSIS — S42001A Fracture of unspecified part of right clavicle, initial encounter for closed fracture: Secondary | ICD-10-CM | POA: Diagnosis present

## 2016-06-22 LAB — TYPE AND SCREEN
ABO/RH(D): B POS
ANTIBODY SCREEN: NEGATIVE
Unit division: 0
Unit division: 0

## 2016-06-22 LAB — BPAM RBC
BLOOD PRODUCT EXPIRATION DATE: 201805162359
Blood Product Expiration Date: 201806082359
ISSUE DATE / TIME: 201805110200
ISSUE DATE / TIME: 201805110241
Unit Type and Rh: 9500
Unit Type and Rh: 9500

## 2016-06-22 LAB — BASIC METABOLIC PANEL
Anion gap: 10 (ref 5–15)
BUN: 15 mg/dL (ref 6–20)
CHLORIDE: 105 mmol/L (ref 101–111)
CO2: 22 mmol/L (ref 22–32)
CREATININE: 1.1 mg/dL (ref 0.61–1.24)
Calcium: 8.5 mg/dL — ABNORMAL LOW (ref 8.9–10.3)
GFR calc non Af Amer: >60 mL/min (ref >60)
Glucose, Bld: 133 mg/dL — ABNORMAL HIGH (ref 65–99)
Potassium: 3.7 mmol/L (ref 3.5–5.1)
Sodium: 137 mmol/L (ref 135–145)

## 2016-06-22 LAB — CBC
HEMATOCRIT: 44.1 % (ref 39.0–52.0)
HEMOGLOBIN: 15.1 g/dL (ref 13.0–17.0)
MCH: 31.9 pg (ref 26.0–34.0)
MCHC: 34.2 g/dL (ref 30.0–36.0)
MCV: 93 fL (ref 78.0–100.0)
Platelets: 201 10*3/uL (ref 150–400)
RBC: 4.74 MIL/uL (ref 4.22–5.81)
RDW: 13.7 % (ref 11.5–15.5)
WBC: 16.5 10*3/uL — ABNORMAL HIGH (ref 4.0–10.5)

## 2016-06-22 LAB — CDS SEROLOGY

## 2016-06-22 LAB — BLOOD PRODUCT ORDER (VERBAL) VERIFICATION

## 2016-06-22 MED ORDER — NICOTINE 21 MG/24HR TD PT24
21.0000 mg | MEDICATED_PATCH | Freq: Every day | TRANSDERMAL | Status: DC
  Administered 2016-06-22 – 2016-07-02 (×11): 21 mg via TRANSDERMAL
  Filled 2016-06-22 (×11): qty 1

## 2016-06-22 MED ORDER — ORAL CARE MOUTH RINSE
15.0000 mL | Freq: Two times a day (BID) | OROMUCOSAL | Status: DC
  Administered 2016-06-22 – 2016-07-01 (×18): 15 mL via OROMUCOSAL

## 2016-06-22 MED ORDER — CHLORHEXIDINE GLUCONATE 0.12 % MT SOLN
15.0000 mL | Freq: Two times a day (BID) | OROMUCOSAL | Status: DC
  Administered 2016-06-22 – 2016-07-02 (×21): 15 mL via OROMUCOSAL
  Filled 2016-06-22 (×15): qty 15

## 2016-06-22 NOTE — Consult Note (Signed)
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 fracture; visualized osseous structures are unremarkable in appearance. Other: Mild soft tissue swelling is noted lateral to the right orbit. CT MAXILLOFACIAL FINDINGS Osseous: There is no evidence of fracture or dislocation. The maxilla and mandible appear intact. The nasal bone is unremarkable in appearance. The visualized dentition demonstrates no acute abnormality. Orbits: The orbits are intact bilaterally. Sinuses: Mucosal thickening is noted at the left maxillary sinus. The remaining visualized paranasal sinuses and mastoid air cells are well-aerated. Soft tissues: Soft tissue swelling is noted along the upper lip. Mild soft tissue injury is suggested  at the anterior vertex. The parapharyngeal fat planes are preserved. The nasopharynx, oropharynx and hypopharynx are unremarkable in appearance. The visualized portions of the valleculae and piriform sinuses are grossly unremarkable. The parotid and submandibular glands are within normal limits. No cervical lymphadenopathy is seen. CT CERVICAL SPINE FINDINGS Alignment: Normal. Skull base and vertebrae: No acute fracture. No primary bone lesion or focal pathologic process. There is a displaced fracture through the right mid clavicle. Soft  tissues and spinal canal: No prevertebral fluid or swelling. No visible canal hematoma. Disc levels: Intervertebral disc spaces are preserved. Mild facet disease is noted at the mid cervical spine. Mild degenerative change is noted about the dens. Upper chest: Emphysema is noted at the lung apices. The thyroid gland is unremarkable in appearance. Mild calcification is noted at the left carotid bifurcation. Other: No additional soft tissue abnormalities are seen. IMPRESSION: 1. Mild acute subarachnoid hemorrhage along the sylvian fissures bilaterally, at the high left parietal lobe and right right frontal lobe. 2. No evidence of fracture or subluxation along the cervical spine. 3. Displaced fracture through the right mid clavicle. 4. No evidence of fracture or dislocation with regard to the maxillofacial structures. 5. Soft tissue swelling along the upper lip. Mild soft tissue injury suggested at the anterior vertex. Mild soft tissue swelling lateral to the right orbit. 6. Mild cortical volume loss. 7. Emphysema at the lung apices. 8. Mild calcification at the left carotid bifurcation. Critical Value/emergent results were called by telephone at the time of interpretation on 06/21/2016 at 6:12 pm to Dr. Ninfa Linden, who verbally acknowledged these results. Electronically Signed   By: Garald Balding M.D.   On: 06/21/2016 18:17    Positive ROS: Unable to obtain from patient. He is not interactive.  Objective: Labs cbc  Recent Labs  06/21/16 1659 06/21/16 1705 06/22/16 0237  WBC 26.3*  --  16.5*  HGB 16.6 16.3 15.1  HCT 47.8 48.0 44.1  PLT 229  --  201    Labs inflam No results for input(s): CRP in the last 72 hours.  Invalid input(s): ESR  Labs coag  Recent Labs  06/21/16 1659  INR 1.03     Recent Labs  06/21/16 1659 06/21/16 1705 06/22/16 0237  NA 138 140 137  K 3.9 3.8 3.7  CL 103 103 105  CO2 23  --  22  GLUCOSE 167* 159* 133*  BUN 15 17 15   CREATININE 1.31* 1.20 1.10  CALCIUM  9.3  --  8.5*    Physical Exam: Vitals:   06/22/16 0500 06/22/16 0600  BP: (!) 148/85 131/82  Pulse: 86 76  Resp: 20 17  Temp:     General: Alert, no acute distress Mental status: Responds to painful stimuli and says stop, but not oriented  Neurologic: Speech Clear.  Does not follow commands Respiratory: No cyanosis, no use of accessory musculature Cardiovascular: No pedal edema GI: Abdomen is soft and non-tender, non-distended. Skin: Warm and dry.  No lesions in the area of chief complaint  Extremities: Warm w/o edema  MUSCULOSKELETAL:  RUE pain w/ ROM / stimuli of mid clavicle.  No lesions in the area.  No tenting or blanching of skin. Other extremities are atraumatic with painless ROM and NVI.   Prudencio Burly III PA-C 06/22/2016 7:40 AM

## 2016-06-22 NOTE — Procedures (Signed)
EEG Report  Clinical history:  Traumatic brain injury, SAH, and altered mental status.  Technical Summary:  A 19 channel digital EEG recording was performed using the 10-20 international system of electrode placement.  Bipolar and referential montages were used.  Total recording time was about 20 minutes.  EEG Description:  There is no posterior alpha rhythm.  Background frequencies are in the 5-6 Hz range symmetrically.  There is intermittent muscle artifact from movement.   There were no epileptiform discharges or electrographic seizures present.    Impression:  This is an abnormal EEG.  There is moderate generalized slowing of brain activity.  This is non-specific but may due to traumatic, sedative medication effects, post-ictal state, toxic, metabolic, hypoxic, or infectious etiologies.  Clinical correlation is recommended.    Weston SettleShervin Dreon Pineda, MS, MD

## 2016-06-22 NOTE — Care Management Note (Signed)
Case Management Note  Patient Details  Name: Tyler AskewDavid Vessey MRN: [id number] Date of Birth: 04/06/1950  Subjective/Objective:   Pt admitted on 06/21/16 after fall from a 10 foot ladder with TBI, SAH, Rt clavicle fx, and Rt superior rami fx.  PTA, pt independent of ADLS, lives with significant other of many years; has supportive daughter.                   Action/Plan: TBI team to see; will follow as pt progresses.    Expected Discharge Date:                  Expected Discharge Plan:  IP Rehab Facility  In-House Referral:     Discharge planning Services  CM Consult  Post Acute Care Choice:    Choice offered to:     DME Arranged:    DME Agency:     HH Arranged:    HH Agency:     Status of Service:  In process, will continue to follow  If discussed at Long Length of Stay Meetings, dates discussed:    Additional Comments:  Quintella BatonJulie W. Shree Espey, RN, BSN  Trauma/Neuro ICU Case Manager [phone]

## 2016-06-22 NOTE — Progress Notes (Signed)
Pt brady to 48.  Did not sustain, stayed in SR. Pt asymptomatic. Notified Dr. Lindie SpruceWyatt of this.  No new orders, will continue to monitor.

## 2016-06-22 NOTE — Progress Notes (Signed)
   06/22/16 1018  Clinical Encounter Type  Visited With Patient and family together  Visit Type Follow-up  Referral From Chaplain  Consult/Referral To Chaplain  Recommendations (follow up as needed)  Stress Factors  Patient Stress Factors None identified  Family Stress Factors None identified  Pt is resting, asleep, spoke with daughter whom is @ bedside, daughter, grandson, and mother also visiting and supporting.  No spiritual needs assessed at the moment, Advised that if further needed assistance to contact Pastoral services.  Chaplain [place] A.Kennedy BuckerLunsford , MA-PC , [phone]

## 2016-06-22 NOTE — Consult Note (Signed)
Reason for Consult: SAH/TBI Referring Physician: ICU  Tyler Lane is an 66 y.o. male.  HPI: He was found down by a ladder yesterday due to presumed unwitnessed fall.  He has a right clavicle fracture, possible cervical fracture, and CT Brain showed small SAH/contusions in the right frontal, left parietal, and bilateral perisylvian areas.   No shift or hydrocephalus or IVH.  He has been slower to come around prompting this consult.  He has no prior history of syncope or seizures.    Past Medical History:  Diagnosis Date  . Depression   . Heart murmur    previously  . High cholesterol   . HOH (hard of hearing)   . Hypertension     History reviewed. No pertinent surgical history.  History reviewed. No pertinent family history.  Social History:  reports that he has been smoking Cigarettes.  He has a 20.00 pack-year smoking history. He has never used smokeless tobacco. He reports that he does not drink alcohol or use drugs.  Allergies: No Known Allergies  Prior to Admission medications   Not on File    Medications:  Scheduled: . chlorhexidine  15 mL Mouth Rinse BID  . mouth rinse  15 mL Mouth Rinse q12n4p    Results for orders placed or performed during the hospital encounter of 06/21/16 (from the past 48 hour(s))  Prepare fresh frozen plasma     Status: None   Collection Time: 06/21/16  4:38 PM  Result Value Ref Range   Unit Number [id number]    Blood Component Type LIQ PLASMA    Unit division 00    Status of Unit REL FROM [facility]    Unit tag comment VERBAL ORDERS PER DR [facility]    Transfusion Status OK TO TRANSFUSE    Unit Number [id number]    Blood Component Type LIQ PLASMA    Unit division 00    Status of Unit REL FROM [facility]    Unit tag comment VERBAL ORDERS PER DR CARDAMA    Transfusion Status OK TO TRANSFUSE   CDS serology     Status: None   Collection Time: 06/21/16  4:59 PM  Result Value Ref Range   CDS serology specimen      SPECIMEN WILL BE HELD FOR  14 DAYS IF TESTING IS REQUIRED  Comprehensive metabolic panel     Status: Abnormal   Collection Time: 06/21/16  4:59 PM  Result Value Ref Range   Sodium 138 135 - 145 mmol/L   Potassium 3.9 3.5 - 5.1 mmol/L   Chloride 103 101 - 111 mmol/L   CO2 23 22 - 32 mmol/L   Glucose, Bld 167 (H) 65 - 99 mg/dL   BUN 15 6 - 20 mg/dL   Creatinine, Ser 1.31 (H) 0.61 - 1.24 mg/dL   Calcium 9.3 8.9 - 10.3 mg/dL   Total Protein 7.0 6.5 - 8.1 g/dL   Albumin 4.1 3.5 - 5.0 g/dL   AST 66 (H) 15 - 41 U/L   ALT 59 17 - 63 U/L   Alkaline Phosphatase 89 38 - 126 U/L   Total Bilirubin 1.1 0.3 - 1.2 mg/dL   GFR calc non Af Amer 56 (L) >60 mL/min   GFR calc Af Amer >60 >60 mL/min    Comment: (NOTE) The eGFR has been calculated using the CKD EPI equation. This calculation has not been validated in all clinical situations. eGFR's persistently <60 mL/min signify possible Chronic Kidney Disease.    Anion gap 12  5 - 15  CBC     Status: Abnormal   Collection Time: 06/21/16  4:59 PM  Result Value Ref Range   WBC 26.3 (H) 4.0 - 10.5 K/uL   RBC 5.08 4.22 - 5.81 MIL/uL   Hemoglobin 16.6 13.0 - 17.0 g/dL   HCT 47.8 39.0 - 52.0 %   MCV 94.1 78.0 - 100.0 fL   MCH 32.7 26.0 - 34.0 pg   MCHC 34.7 30.0 - 36.0 g/dL   RDW 13.6 11.5 - 15.5 %   Platelets 229 150 - 400 K/uL  Ethanol     Status: None   Collection Time: 06/21/16  4:59 PM  Result Value Ref Range   Alcohol, Ethyl (B) <5 <5 mg/dL    Comment:        LOWEST DETECTABLE LIMIT FOR SERUM ALCOHOL IS 5 mg/dL FOR MEDICAL PURPOSES ONLY   Protime-INR     Status: None   Collection Time: 06/21/16  4:59 PM  Result Value Ref Range   Prothrombin Time 13.5 11.4 - 15.2 seconds   INR 1.03   Type and screen     Status: None   Collection Time: 06/21/16  5:00 PM  Result Value Ref Range   ABO/RH(D) B POS    Antibody Screen NEG    Sample Expiration 06/24/2016    Unit Number [id number]    Blood Component Type RBC CPDA1, LR    Unit division 00    Status of Unit REL  FROM [facility]    Unit tag comment VERBAL ORDERS PER DR CARDAMA    Transfusion Status OK TO TRANSFUSE    Crossmatch Result NOT NEEDED    Unit Number [id number]    Blood Component Type RED CELLS,LR    Unit division 00    Status of Unit REL FROM [facility]    Unit tag comment VERBAL ORDERS PER DR CARADAMA    Transfusion Status OK TO TRANSFUSE    Crossmatch Result NOT NEEDED   ABO/Rh     Status: None   Collection Time: 06/21/16  5:00 PM  Result Value Ref Range   ABO/RH(D) B POS   I-Stat Chem 8, ED     Status: Abnormal   Collection Time: 06/21/16  5:05 PM  Result Value Ref Range   Sodium 140 135 - 145 mmol/L   Potassium 3.8 3.5 - 5.1 mmol/L   Chloride 103 101 - 111 mmol/L   BUN 17 6 - 20 mg/dL   Creatinine, Ser 1.20 0.61 - 1.24 mg/dL   Glucose, Bld 159 (H) 65 - 99 mg/dL   Calcium, Ion 1.08 (L) 1.15 - 1.40 mmol/L   TCO2 26 0 - 100 mmol/L   Hemoglobin 16.3 13.0 - 17.0 g/dL   HCT 48.0 39.0 - 52.0 %  I-Stat CG4 Lactic Acid, ED     Status: Abnormal   Collection Time: 06/21/16  5:06 PM  Result Value Ref Range   Lactic Acid, Venous 2.44 (HH) 0.5 - 1.9 mmol/L   Comment NOTIFIED PHYSICIAN   MRSA PCR Screening     Status: None   Collection Time: 06/21/16  8:11 PM  Result Value Ref Range   MRSA by PCR NEGATIVE NEGATIVE    Comment:        The GeneXpert MRSA Assay (FDA approved for NASAL specimens only), is one component of a comprehensive MRSA colonization surveillance program. It is not intended to diagnose MRSA infection nor to guide or monitor treatment for MRSA infections.   Urinalysis, Routine w  reflex microscopic     Status: Abnormal   Collection Time: 06/21/16  8:35 PM  Result Value Ref Range   Color, Urine YELLOW YELLOW   APPearance CLEAR CLEAR   Specific Gravity, Urine 1.033 (H) 1.005 - 1.030   pH 6.0 5.0 - 8.0   Glucose, UA NEGATIVE NEGATIVE mg/dL   Hgb urine dipstick NEGATIVE NEGATIVE   Bilirubin Urine NEGATIVE NEGATIVE   Ketones, ur NEGATIVE NEGATIVE mg/dL   Protein,  ur NEGATIVE NEGATIVE mg/dL   Nitrite NEGATIVE NEGATIVE   Leukocytes, UA NEGATIVE NEGATIVE  CBC     Status: Abnormal   Collection Time: 06/22/16  2:37 AM  Result Value Ref Range   WBC 16.5 (H) 4.0 - 10.5 K/uL   RBC 4.74 4.22 - 5.81 MIL/uL   Hemoglobin 15.1 13.0 - 17.0 g/dL   HCT 44.1 39.0 - 52.0 %   MCV 93.0 78.0 - 100.0 fL   MCH 31.9 26.0 - 34.0 pg   MCHC 34.2 30.0 - 36.0 g/dL   RDW 13.7 11.5 - 15.5 %   Platelets 201 150 - 400 K/uL  Basic metabolic panel     Status: Abnormal   Collection Time: 06/22/16  2:37 AM  Result Value Ref Range   Sodium 137 135 - 145 mmol/L   Potassium 3.7 3.5 - 5.1 mmol/L   Chloride 105 101 - 111 mmol/L   CO2 22 22 - 32 mmol/L   Glucose, Bld 133 (H) 65 - 99 mg/dL   BUN 15 6 - 20 mg/dL   Creatinine, Ser 1.10 0.61 - 1.24 mg/dL   Calcium 8.5 (L) 8.9 - 10.3 mg/dL   GFR calc non Af Amer >60 >60 mL/min   GFR calc Af Amer >60 >60 mL/min    Comment: (NOTE) The eGFR has been calculated using the CKD EPI equation. This calculation has not been validated in all clinical situations. eGFR's persistently <60 mL/min signify possible Chronic Kidney Disease.    Anion gap 10 5 - 15  Provider-confirm verbal Blood Bank order - RBC, FFP, Type & Screen; 2 Units; Order taken: 06/21/2016; 4:42 PM; Level 1 Trauma, Emergency Release, STAT 2 units of O negative red cells and 2 units of A plasmas emergency released to the ER @ 1645. All...     Status: None   Collection Time: 06/22/16  8:30 AM  Result Value Ref Range   Blood product order confirm MD AUTHORIZATION REQUESTED     Ct Head Without Contrast  Result Date: 06/22/2016 CLINICAL DATA:  66 y/o M; traumatic brain injury with subarachnoid hemorrhage. EXAM: CT HEAD WITHOUT CONTRAST TECHNIQUE: Contiguous axial images were obtained from the base of the skull through the vertex without intravenous contrast. COMPARISON:  06/21/2016 CT of the head FINDINGS: Brain: Stable small volume of subarachnoid hemorrhage in sylvian fissures,  paramedian right frontal lobe, and parietal lobes. No evidence for new intracranial hemorrhage, large territory infarct, or mass effect. No effacement of basilar cisterns. Vascular: No hyperdense vessel or unexpected calcification. Skull: Normal. Negative for fracture or focal lesion. Sinuses/Orbits: Mild diffuse paranasal sinus mucosal thickening. Normal aeration of mastoid air cells. New Other: None. IMPRESSION: Stable small volume of subarachnoid hemorrhage. No new acute intracranial abnormality. Electronically Signed   By: Kristine Garbe M.D.   On: 06/22/2016 05:57   Ct Head Wo Contrast  Result Date: 06/21/2016 CLINICAL DATA:  Found on ground near ladder. Patient unresponsive. Concern for head, maxillofacial or cervical spine injury. Initial encounter. EXAM: CT HEAD WITHOUT CONTRAST CT MAXILLOFACIAL  WITHOUT CONTRAST CT CERVICAL SPINE WITHOUT CONTRAST TECHNIQUE: Multidetector CT imaging of the head, cervical spine, and maxillofacial structures were performed using the standard protocol without intravenous contrast. Multiplanar CT image reconstructions of the cervical spine and maxillofacial structures were also generated. COMPARISON:  None. FINDINGS: CT HEAD FINDINGS Brain: There appears to be a small amount of subarachnoid hemorrhage along the sylvian fissures bilaterally, and at the high left parietal lobe and right frontal lobe. No evidence of acute infarction, hemorrhage, hydrocephalus, extra-axial collection or mass lesion/mass effect. Prominence of the ventricles and sulci reflects mild cortical volume loss. Mild cerebellar atrophy is noted. The brainstem and fourth ventricle are within normal limits. The basal ganglia are unremarkable in appearance. The cerebral hemispheres demonstrate grossly normal gray-white differentiation. No mass effect or midline shift is seen. Vascular: No hyperdense vessel or unexpected calcification. Skull: There is no evidence of fracture; visualized osseous  structures are unremarkable in appearance. Other: Mild soft tissue swelling is noted lateral to the right orbit. CT MAXILLOFACIAL FINDINGS Osseous: There is no evidence of fracture or dislocation. The maxilla and mandible appear intact. The nasal bone is unremarkable in appearance. The visualized dentition demonstrates no acute abnormality. Orbits: The orbits are intact bilaterally. Sinuses: Mucosal thickening is noted at the left maxillary sinus. The remaining visualized paranasal sinuses and mastoid air cells are well-aerated. Soft tissues: Soft tissue swelling is noted along the upper lip. Mild soft tissue injury is suggested at the anterior vertex. The parapharyngeal fat planes are preserved. The nasopharynx, oropharynx and hypopharynx are unremarkable in appearance. The visualized portions of the valleculae and piriform sinuses are grossly unremarkable. The parotid and submandibular glands are within normal limits. No cervical lymphadenopathy is seen. CT CERVICAL SPINE FINDINGS Alignment: Normal. Skull base and vertebrae: No acute fracture. No primary bone lesion or focal pathologic process. There is a displaced fracture through the right mid clavicle. Soft tissues and spinal canal: No prevertebral fluid or swelling. No visible canal hematoma. Disc levels: Intervertebral disc spaces are preserved. Mild facet disease is noted at the mid cervical spine. Mild degenerative change is noted about the dens. Upper chest: Emphysema is noted at the lung apices. The thyroid gland is unremarkable in appearance. Mild calcification is noted at the left carotid bifurcation. Other: No additional soft tissue abnormalities are seen. IMPRESSION: 1. Mild acute subarachnoid hemorrhage along the sylvian fissures bilaterally, at the high left parietal lobe and right right frontal lobe. 2. No evidence of fracture or subluxation along the cervical spine. 3. Displaced fracture through the right mid clavicle. 4. No evidence of fracture  or dislocation with regard to the maxillofacial structures. 5. Soft tissue swelling along the upper lip. Mild soft tissue injury suggested at the anterior vertex. Mild soft tissue swelling lateral to the right orbit. 6. Mild cortical volume loss. 7. Emphysema at the lung apices. 8. Mild calcification at the left carotid bifurcation. Critical Value/emergent results were called by telephone at the time of interpretation on 06/21/2016 at 6:12 pm to Dr. Ninfa Linden, who verbally acknowledged these results. Electronically Signed   By: Garald Balding M.D.   On: 06/21/2016 18:17   Ct Chest W Contrast  Result Date: 06/21/2016 CLINICAL DATA:  Patient found unresponsive near ladder. EXAM: CT CHEST, ABDOMEN, AND PELVIS WITH CONTRAST TECHNIQUE: Multidetector CT imaging of the chest, abdomen and pelvis was performed following the standard protocol during bolus administration of intravenous contrast. CONTRAST:  12m ISOVUE-300 IOPAMIDOL (ISOVUE-300) INJECTION 61% COMPARISON:  None. FINDINGS: CT CHEST FINDINGS Cardiovascular: Normal heart  size. No pericardial effusion. Normal appearance of the great vessels. Calcific atherosclerotic disease of the coronary arteries. Mediastinum/Nodes: No enlarged mediastinal, hilar, or axillary lymph nodes. Thyroid gland, and trachea demonstrate no significant findings. Small hiatal hernia. Lungs/Pleura: Tiny right anterior pneumothorax. Contusion of the anterior and posterior aspect of the right lung, likely due to rib fractures. Mild to moderate emphysematous changes in the upper lobes. 5 mm left lower lobe subpleural pulmonary nodule. Musculoskeletal: Right-sided posterolateral second through sixth rib fractures with mild displacement. Oblique right clavicular fracture. Compression deformity of the superior endplates of sixth and seventh thoracic vertebral bodies, with uncertain etiology. CT ABDOMEN PELVIS FINDINGS Hepatobiliary: No hepatic injury or perihepatic hematoma. Gallbladder is  unremarkable Pancreas: Unremarkable. No pancreatic ductal dilatation or surrounding inflammatory changes. Spleen: Normal in size without focal abnormality. Adrenals/Urinary Tract: Adrenal glands are unremarkable. Kidneys are without renal calculi, focal lesion, or hydronephrosis. Bilateral renal cysts. Bladder is unremarkable. Stomach/Bowel: Stomach is within normal limits. Appendix appears normal. No evidence of bowel wall thickening, distention, or inflammatory changes. Vascular/Lymphatic: Aortic atherosclerosis. No enlarged abdominal or pelvic lymph nodes. Reproductive: Mildly enlarged prostate gland. Other: No abdominal wall hernia or abnormality. No abdominopelvic ascites. Musculoskeletal: Minimally displaced comminuted fracture of the inferior right sacrum with extension to the sacroiliac joint. Comminuted transverse fracture of the proximal right pubic ramus. IMPRESSION: Posterolateral, some displaced, right second through sixth rib fractures, with subsequent tiny right anterior pneumothorax and contusion of the subpleural posterior and anterior right lung parenchyma. Right clavicular fracture. Mildly displaced comminuted fracture of the inferior right sacrum with extension to the sacroiliac joint. Comminuted transverse fracture of the proximal right pubic ramus. Compression deformity of the superior endplates of the sixth and seventh thoracic vertebral bodies, with uncertain etiology. Given background of osteoarthritic changes this may represent discogenic endplate changes rather than acute fractures. No evidence of acute traumatic injury to the solid abdominal organs. Mild to moderate upper lobe predominant emphysematous changes of the lungs. 5 mm left lower lobe subpleural pulmonary nodule. No follow-up needed if patient is low-risk. Non-contrast chest CT can be considered in 12 months if patient is high-risk. This recommendation follows the consensus statement: Guidelines for Management of Incidental  Pulmonary Nodules Detected on CT Images: From the Fleischner Society 2017; Radiology 2017; 284:228-243. Calcific atherosclerotic disease of the coronary arteries and aorta. Small hiatal hernia. Moderate enlargement of the prostate gland. Please correlate to patient's PSA values. Electronically Signed   By: Fidela Salisbury M.D.   On: 06/21/2016 18:41   Ct Cervical Spine Wo Contrast  Result Date: 06/21/2016 CLINICAL DATA:  Found on ground near ladder. Patient unresponsive. Concern for head, maxillofacial or cervical spine injury. Initial encounter. EXAM: CT HEAD WITHOUT CONTRAST CT MAXILLOFACIAL WITHOUT CONTRAST CT CERVICAL SPINE WITHOUT CONTRAST TECHNIQUE: Multidetector CT imaging of the head, cervical spine, and maxillofacial structures were performed using the standard protocol without intravenous contrast. Multiplanar CT image reconstructions of the cervical spine and maxillofacial structures were also generated. COMPARISON:  None. FINDINGS: CT HEAD FINDINGS Brain: There appears to be a small amount of subarachnoid hemorrhage along the sylvian fissures bilaterally, and at the high left parietal lobe and right frontal lobe. No evidence of acute infarction, hemorrhage, hydrocephalus, extra-axial collection or mass lesion/mass effect. Prominence of the ventricles and sulci reflects mild cortical volume loss. Mild cerebellar atrophy is noted. The brainstem and fourth ventricle are within normal limits. The basal ganglia are unremarkable in appearance. The cerebral hemispheres demonstrate grossly normal gray-white differentiation. No mass effect or midline  shift is seen. Vascular: No hyperdense vessel or unexpected calcification. Skull: There is no evidence of fracture; visualized osseous structures are unremarkable in appearance. Other: Mild soft tissue swelling is noted lateral to the right orbit. CT MAXILLOFACIAL FINDINGS Osseous: There is no evidence of fracture or dislocation. The maxilla and mandible  appear intact. The nasal bone is unremarkable in appearance. The visualized dentition demonstrates no acute abnormality. Orbits: The orbits are intact bilaterally. Sinuses: Mucosal thickening is noted at the left maxillary sinus. The remaining visualized paranasal sinuses and mastoid air cells are well-aerated. Soft tissues: Soft tissue swelling is noted along the upper lip. Mild soft tissue injury is suggested at the anterior vertex. The parapharyngeal fat planes are preserved. The nasopharynx, oropharynx and hypopharynx are unremarkable in appearance. The visualized portions of the valleculae and piriform sinuses are grossly unremarkable. The parotid and submandibular glands are within normal limits. No cervical lymphadenopathy is seen. CT CERVICAL SPINE FINDINGS Alignment: Normal. Skull base and vertebrae: No acute fracture. No primary bone lesion or focal pathologic process. There is a displaced fracture through the right mid clavicle. Soft tissues and spinal canal: No prevertebral fluid or swelling. No visible canal hematoma. Disc levels: Intervertebral disc spaces are preserved. Mild facet disease is noted at the mid cervical spine. Mild degenerative change is noted about the dens. Upper chest: Emphysema is noted at the lung apices. The thyroid gland is unremarkable in appearance. Mild calcification is noted at the left carotid bifurcation. Other: No additional soft tissue abnormalities are seen. IMPRESSION: 1. Mild acute subarachnoid hemorrhage along the sylvian fissures bilaterally, at the high left parietal lobe and right right frontal lobe. 2. No evidence of fracture or subluxation along the cervical spine. 3. Displaced fracture through the right mid clavicle. 4. No evidence of fracture or dislocation with regard to the maxillofacial structures. 5. Soft tissue swelling along the upper lip. Mild soft tissue injury suggested at the anterior vertex. Mild soft tissue swelling lateral to the right orbit. 6.  Mild cortical volume loss. 7. Emphysema at the lung apices. 8. Mild calcification at the left carotid bifurcation. Critical Value/emergent results were called by telephone at the time of interpretation on 06/21/2016 at 6:12 pm to Dr. Ninfa Linden, who verbally acknowledged these results. Electronically Signed   By: Garald Balding M.D.   On: 06/21/2016 18:17   Ct Abdomen Pelvis W Contrast  Result Date: 06/21/2016 CLINICAL DATA:  Patient found unresponsive near ladder. EXAM: CT CHEST, ABDOMEN, AND PELVIS WITH CONTRAST TECHNIQUE: Multidetector CT imaging of the chest, abdomen and pelvis was performed following the standard protocol during bolus administration of intravenous contrast. CONTRAST:  11m ISOVUE-300 IOPAMIDOL (ISOVUE-300) INJECTION 61% COMPARISON:  None. FINDINGS: CT CHEST FINDINGS Cardiovascular: Normal heart size. No pericardial effusion. Normal appearance of the great vessels. Calcific atherosclerotic disease of the coronary arteries. Mediastinum/Nodes: No enlarged mediastinal, hilar, or axillary lymph nodes. Thyroid gland, and trachea demonstrate no significant findings. Small hiatal hernia. Lungs/Pleura: Tiny right anterior pneumothorax. Contusion of the anterior and posterior aspect of the right lung, likely due to rib fractures. Mild to moderate emphysematous changes in the upper lobes. 5 mm left lower lobe subpleural pulmonary nodule. Musculoskeletal: Right-sided posterolateral second through sixth rib fractures with mild displacement. Oblique right clavicular fracture. Compression deformity of the superior endplates of sixth and seventh thoracic vertebral bodies, with uncertain etiology. CT ABDOMEN PELVIS FINDINGS Hepatobiliary: No hepatic injury or perihepatic hematoma. Gallbladder is unremarkable Pancreas: Unremarkable. No pancreatic ductal dilatation or surrounding inflammatory changes. Spleen: Normal in  size without focal abnormality. Adrenals/Urinary Tract: Adrenal glands are unremarkable.  Kidneys are without renal calculi, focal lesion, or hydronephrosis. Bilateral renal cysts. Bladder is unremarkable. Stomach/Bowel: Stomach is within normal limits. Appendix appears normal. No evidence of bowel wall thickening, distention, or inflammatory changes. Vascular/Lymphatic: Aortic atherosclerosis. No enlarged abdominal or pelvic lymph nodes. Reproductive: Mildly enlarged prostate gland. Other: No abdominal wall hernia or abnormality. No abdominopelvic ascites. Musculoskeletal: Minimally displaced comminuted fracture of the inferior right sacrum with extension to the sacroiliac joint. Comminuted transverse fracture of the proximal right pubic ramus. IMPRESSION: Posterolateral, some displaced, right second through sixth rib fractures, with subsequent tiny right anterior pneumothorax and contusion of the subpleural posterior and anterior right lung parenchyma. Right clavicular fracture. Mildly displaced comminuted fracture of the inferior right sacrum with extension to the sacroiliac joint. Comminuted transverse fracture of the proximal right pubic ramus. Compression deformity of the superior endplates of the sixth and seventh thoracic vertebral bodies, with uncertain etiology. Given background of osteoarthritic changes this may represent discogenic endplate changes rather than acute fractures. No evidence of acute traumatic injury to the solid abdominal organs. Mild to moderate upper lobe predominant emphysematous changes of the lungs. 5 mm left lower lobe subpleural pulmonary nodule. No follow-up needed if patient is low-risk. Non-contrast chest CT can be considered in 12 months if patient is high-risk. This recommendation follows the consensus statement: Guidelines for Management of Incidental Pulmonary Nodules Detected on CT Images: From the Fleischner Society 2017; Radiology 2017; 284:228-243. Calcific atherosclerotic disease of the coronary arteries and aorta. Small hiatal hernia. Moderate enlargement of  the prostate gland. Please correlate to patient's PSA values. Electronically Signed   By: Fidela Salisbury M.D.   On: 06/21/2016 18:41   Dg Pelvis Portable  Result Date: 06/21/2016 CLINICAL DATA:  66 year old who fell from a 10 foot ladder earlier today. Initial encounter. EXAM: PORTABLE PELVIS 1-2 VIEWS COMPARISON:  None. FINDINGS: Comminuted, mildly displaced fracture involving the right superior pubic ramus. No other visible fractures. Sacroiliac joints and symphysis pubis intact without evidence of diastasis. Benign bone island in the roof of the right acetabulum. IMPRESSION: Acute traumatic mildly displaced fracture involving the right superior pubic ramus. Electronically Signed   By: Evangeline Dakin M.D.   On: 06/21/2016 17:19   Ct T-spine No Charge  Result Date: 06/21/2016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

## 2016-06-22 NOTE — Progress Notes (Signed)
EEG Completed; Results Pending  

## 2016-06-22 NOTE — Progress Notes (Signed)
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 dentition demonstrates no acute abnormality. Orbits: The orbits are intact bilaterally. Sinuses: Mucosal thickening is noted at the left maxillary sinus. The remaining visualized paranasal sinuses and mastoid air cells are well-aerated. Soft tissues: Soft tissue swelling is noted along the upper lip. Mild soft tissue injury is suggested at the anterior vertex. The parapharyngeal fat planes are preserved. The nasopharynx, oropharynx and hypopharynx are unremarkable in appearance. The visualized portions of the valleculae and piriform sinuses are grossly unremarkable. The parotid and submandibular glands are within normal limits. No  cervical lymphadenopathy is seen. CT CERVICAL SPINE FINDINGS Alignment: Normal. Skull base and vertebrae: No acute fracture. No primary bone lesion or focal pathologic process. There is a displaced fracture through the right mid clavicle. Soft tissues and spinal canal: No prevertebral fluid or swelling. No visible canal hematoma. Disc levels: Intervertebral disc spaces are preserved. Mild facet disease is noted at the mid cervical spine. Mild degenerative change is noted about the dens. Upper chest: Emphysema is noted at the lung apices. The  thyroid gland is unremarkable in appearance. Mild calcification is noted at the left carotid bifurcation. Other: No additional soft tissue abnormalities are seen. IMPRESSION: 1. Mild acute subarachnoid hemorrhage along the sylvian fissures bilaterally, at the high left parietal lobe and right right frontal lobe. 2. No evidence of fracture or subluxation along the cervical spine. 3. Displaced fracture through the right mid clavicle. 4. No evidence of fracture or dislocation with regard to the maxillofacial structures. 5. Soft tissue swelling along the upper lip. Mild soft tissue injury suggested at the anterior vertex. Mild soft tissue swelling lateral to the right orbit. 6. Mild cortical volume loss. 7. Emphysema at the lung apices. 8. Mild calcification at the left carotid bifurcation. Critical Value/emergent results were called by telephone at the time of interpretation on 06/21/2016 at 6:12 pm to Dr. Magnus Ivan, who verbally acknowledged these results. Electronically Signed   By: Roanna Raider M.D.   On: 06/21/2016 18:17    Anti-infectives: Anti-infectives    None      Assessment/Plan: s/p  Keep NPO.  Neurology consultation. MRI of the brain without contrast. TBI team  LOS: 1 day   Marta Lamas. Gae Bon, MD, FACS [phone] Trauma Surgeon 06/22/2016

## 2016-06-22 NOTE — Progress Notes (Signed)
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 nasopharynx, oropharynx and hypopharynx are unremarkable in appearance. The visualized portions of the valleculae and piriform sinuses are grossly unremarkable. The parotid and submandibular glands are within normal limits. No cervical lymphadenopathy is seen. CT CERVICAL SPINE FINDINGS Alignment: Normal. Skull base and vertebrae: No acute fracture. No primary bone lesion or focal pathologic process. There is a displaced fracture through the right mid clavicle. Soft tissues and spinal canal: No prevertebral fluid or swelling. No visible canal hematoma. Disc levels: Intervertebral disc spaces are preserved. Mild facet disease is noted at the mid cervical spine. Mild degenerative change is noted about the dens. Upper chest: Emphysema is noted at the lung apices. The thyroid gland is  unremarkable in appearance. Mild calcification is noted at the left carotid bifurcation. Other: No additional soft tissue abnormalities are seen. IMPRESSION: 1. Mild acute subarachnoid hemorrhage along the sylvian fissures bilaterally, at the high left parietal lobe and right right frontal lobe. 2. No evidence of fracture or subluxation along the cervical spine. 3. Displaced fracture through the right mid clavicle. 4. No evidence of fracture or  dislocation with regard to the maxillofacial structures. 5. Soft tissue swelling along the upper lip. Mild soft tissue injury suggested at the anterior vertex. Mild soft tissue swelling lateral to the right orbit. 6. Mild cortical volume loss. 7. Emphysema at the lung apices. 8. Mild calcification at the left carotid bifurcation. Critical Value/emergent results were called by telephone at the time of interpretation on 06/21/2016 at 6:12 pm to Dr. Magnus Ivan, who verbally acknowledged these results. Electronically Signed   By: Roanna Raider M.D.   On: 06/21/2016 18:17    Assessment/Plan: Patient's neurologic status has not improved significantly since admission.  Post-ictal state felt to be less likely.  No acute infarct identified on CT.  Small amount of bilateral SAH is stable.  I would proceed with MRI brain and Neurology consult.  Continue to follow in ICU.  Even if presence of stroke, patient would not be candidate for anticoagulation, given recent trauma including TBI.    LOS: 1 day    Dorian Heckle, MD 06/22/2016, 8:08 AM

## 2016-06-23 ENCOUNTER — Inpatient Hospital Stay (HOSPITAL_COMMUNITY): Payer: Medicare HMO

## 2016-06-23 MED ORDER — ACETAMINOPHEN 325 MG PO TABS
650.0000 mg | ORAL_TABLET | ORAL | Status: DC | PRN
  Administered 2016-06-23 – 2016-06-26 (×2): 650 mg via ORAL
  Filled 2016-06-23 (×3): qty 2

## 2016-06-23 NOTE — Progress Notes (Signed)
PT Cancellation Note  Patient Details Name: Tyler AskewDavid Chinchilla MRN: [id number] DOB: [birth year]   Cancelled Treatment:    Reason Eval/Treat Not Completed: Patient not medically ready Pt on bedrest. Will await increase in activity orders prior to PT evaluation.    Blake DivineShauna A Tisheena Maguire 06/23/2016, 6:39 AM Mylo RedShauna Russell Quinney, PT, DPT [phone]

## 2016-06-23 NOTE — Progress Notes (Signed)
Trauma Service Note  Subjective: MRi showed worse DAI compared to CT.  Still making improvements with verbal.  Now knows he is in hospital and responding to granddaughter appropriately.    Objective: Vital signs in last 24 hours: Temp:  [98.6 F (37 C)-100.1 F (37.8 C)] 99.2 F (37.3 C) (05/12 0758) Pulse Rate:  [37-77] 51 (05/12 0700) Resp:  [14-20] 15 (05/12 0700) BP: (124-158)/(68-93) 130/75 (05/12 0700) SpO2:  [91 %-99 %] 99 % (05/12 0700)    Intake/Output from previous day: 05/11 0701 - 05/12 0700 In: 2675 [I.V.:2500] Out: 925 [Urine:925] Intake/Output this shift: No intake/output data recorded.  General: No acute distress.  Has followed some commands with RN and speech.    Lungs: breathing comfortably  Abd: soft, non tender.  Non distended.    Extremities: No changes, moving all extremities   Lab Results: CBC   Recent Labs  06/21/16 1659 06/21/16 1705 06/22/16 0237  WBC 26.3*  --  16.5*  HGB 16.6 16.3 15.1  HCT 47.8 48.0 44.1  PLT 229  --  201   BMET  Recent Labs  06/21/16 1659 06/21/16 1705 06/22/16 0237  NA 138 140 137  K 3.9 3.8 3.7  CL 103 103 105  CO2 23  --  22  GLUCOSE 167* 159* 133*  BUN 15 17 15   CREATININE 1.31* 1.20 1.10  CALCIUM 9.3  --  8.5*   PT/INR  Recent Labs  06/21/16 1659  LABPROT 13.5  INR 1.03   ABG No results for input(s): PHART, HCO3 in the last 72 hours.  Invalid input(s): PCO2, PO2  Studies/Results: Ct Head Without Contrast  Result Date: 06/22/2016 CLINICAL DATA:  66 y/o M; traumatic brain injury with subarachnoid hemorrhage. EXAM: CT HEAD WITHOUT CONTRAST TECHNIQUE: Contiguous axial images were obtained from the base of the skull through the vertex without intravenous contrast. COMPARISON:  06/21/2016 CT of the head FINDINGS: Brain: Stable small volume of subarachnoid hemorrhage in sylvian fissures, paramedian right frontal lobe, and parietal lobes. No evidence for new intracranial hemorrhage, large  territory infarct, or mass effect. No effacement of basilar cisterns. Vascular: No hyperdense vessel or unexpected calcification. Skull: Normal. Negative for fracture or focal lesion. Sinuses/Orbits: Mild diffuse paranasal sinus mucosal thickening. Normal aeration of mastoid air cells. New Other: None. IMPRESSION: Stable small volume of subarachnoid hemorrhage. No new acute intracranial abnormality. Electronically Signed   By: Mitzi Hansen M.D.   On: 06/22/2016 05:57   Ct Head Wo Contrast  Result Date: 06/21/2016 CLINICAL DATA:  Found on ground near ladder. Patient unresponsive. Concern for head, maxillofacial or cervical spine injury. Initial encounter. EXAM: CT HEAD WITHOUT CONTRAST CT MAXILLOFACIAL WITHOUT CONTRAST CT CERVICAL SPINE WITHOUT CONTRAST TECHNIQUE: Multidetector CT imaging of the head, cervical spine, and maxillofacial structures were performed using the standard protocol without intravenous contrast. Multiplanar CT image reconstructions of the cervical spine and maxillofacial structures were also generated. COMPARISON:  None. FINDINGS: CT HEAD FINDINGS Brain: There appears to be a small amount of subarachnoid hemorrhage along the sylvian fissures bilaterally, and at the high left parietal lobe and right frontal lobe. No evidence of acute infarction, hemorrhage, hydrocephalus, extra-axial collection or mass lesion/mass effect. Prominence of the ventricles and sulci reflects mild cortical volume loss. Mild cerebellar atrophy is noted. The brainstem and fourth ventricle are within normal limits. The basal ganglia are unremarkable in appearance. The cerebral hemispheres demonstrate grossly normal gray-white differentiation. No mass effect or midline shift is seen. Vascular: No hyperdense vessel or unexpected  calcification. Skull: There is no evidence of fracture; visualized osseous structures are unremarkable in appearance. Other: Mild soft tissue swelling is noted lateral to the right  orbit. CT MAXILLOFACIAL FINDINGS Osseous: There is no evidence of fracture or dislocation. The maxilla and mandible appear intact. The nasal bone is unremarkable in appearance. The visualized dentition demonstrates no acute abnormality. Orbits: The orbits are intact bilaterally. Sinuses: Mucosal thickening is noted at the left maxillary sinus. The remaining visualized paranasal sinuses and mastoid air cells are well-aerated. Soft tissues: Soft tissue swelling is noted along the upper lip. Mild soft tissue injury is suggested at the anterior vertex. The parapharyngeal fat planes are preserved. The nasopharynx, oropharynx and hypopharynx are unremarkable in appearance. The visualized portions of the valleculae and piriform sinuses are grossly unremarkable. The parotid and submandibular glands are within normal limits. No cervical lymphadenopathy is seen. CT CERVICAL SPINE FINDINGS Alignment: Normal. Skull base and vertebrae: No acute fracture. No primary bone lesion or focal pathologic process. There is a displaced fracture through the right mid clavicle. Soft tissues and spinal canal: No prevertebral fluid or swelling. No visible canal hematoma. Disc levels: Intervertebral disc spaces are preserved. Mild facet disease is noted at the mid cervical spine. Mild degenerative change is noted about the dens. Upper chest: Emphysema is noted at the lung apices. The thyroid gland is unremarkable in appearance. Mild calcification is noted at the left carotid bifurcation. Other: No additional soft tissue abnormalities are seen. IMPRESSION: 1. Mild acute subarachnoid hemorrhage along the sylvian fissures bilaterally, at the high left parietal lobe and right right frontal lobe. 2. No evidence of fracture or subluxation along the cervical spine. 3. Displaced fracture through the right mid clavicle. 4. No evidence of fracture or dislocation with regard to the maxillofacial structures. 5. Soft tissue swelling along the upper lip.  Mild soft tissue injury suggested at the anterior vertex. Mild soft tissue swelling lateral to the right orbit. 6. Mild cortical volume loss. 7. Emphysema at the lung apices. 8. Mild calcification at the left carotid bifurcation. Critical Value/emergent results were called by telephone at the time of interpretation on 06/21/2016 at 6:12 pm to Dr. Magnus Ivan, who verbally acknowledged these results. Electronically Signed   By: Roanna Raider M.D.   On: 06/21/2016 18:17   Ct Chest W Contrast  Result Date: 06/21/2016 CLINICAL DATA:  Patient found unresponsive near ladder. EXAM: CT CHEST, ABDOMEN, AND PELVIS WITH CONTRAST TECHNIQUE: Multidetector CT imaging of the chest, abdomen and pelvis was performed following the standard protocol during bolus administration of intravenous contrast. CONTRAST:  ISOVUE-300 IOPAMIDOL (ISOVUE-300) INJECTION 61% COMPARISON:  None. FINDINGS: CT CHEST FINDINGS Cardiovascular: Normal heart size. No pericardial effusion. Normal appearance of the great vessels. Calcific atherosclerotic disease of the coronary arteries. Mediastinum/Nodes: No enlarged mediastinal, hilar, or axillary lymph nodes. Thyroid gland, and trachea demonstrate no significant findings. Small hiatal hernia. Lungs/Pleura: Tiny right anterior pneumothorax. Contusion of the anterior and posterior aspect of the right lung, likely due to rib fractures. Mild to moderate emphysematous changes in the upper lobes. 5 mm left lower lobe subpleural pulmonary nodule. Musculoskeletal: Right-sided posterolateral second through sixth rib fractures with mild displacement. Oblique right clavicular fracture. Compression deformity of the superior endplates of sixth and seventh thoracic vertebral bodies, with uncertain etiology. CT ABDOMEN PELVIS FINDINGS Hepatobiliary: No hepatic injury or perihepatic hematoma. Gallbladder is unremarkable Pancreas: Unremarkable. No pancreatic ductal dilatation or surrounding inflammatory changes.  Spleen: Normal in size without focal abnormality. Adrenals/Urinary Tract: Adrenal glands are  unremarkable. Kidneys are without renal calculi, focal lesion, or hydronephrosis. Bilateral renal cysts. Bladder is unremarkable. Stomach/Bowel: Stomach is within normal limits. Appendix appears normal. No evidence of bowel wall thickening, distention, or inflammatory changes. Vascular/Lymphatic: Aortic atherosclerosis. No enlarged abdominal or pelvic lymph nodes. Reproductive: Mildly enlarged prostate gland. Other: No abdominal wall hernia or abnormality. No abdominopelvic ascites. Musculoskeletal: Minimally displaced comminuted fracture of the inferior right sacrum with extension to the sacroiliac joint. Comminuted transverse fracture of the proximal right pubic ramus. IMPRESSION: Posterolateral, some displaced, right second through sixth rib fractures, with subsequent tiny right anterior pneumothorax and contusion of the subpleural posterior and anterior right lung parenchyma. Right clavicular fracture. Mildly displaced comminuted fracture of the inferior right sacrum with extension to the sacroiliac joint. Comminuted transverse fracture of the proximal right pubic ramus. Compression deformity of the superior endplates of the sixth and seventh thoracic vertebral bodies, with uncertain etiology. Given background of osteoarthritic changes this may represent discogenic endplate changes rather than acute fractures. No evidence of acute traumatic injury to the solid abdominal organs. Mild to moderate upper lobe predominant emphysematous changes of the lungs. 5 mm left lower lobe subpleural pulmonary nodule. No follow-up needed if patient is low-risk. Non-contrast chest CT can be considered in 12 months if patient is high-risk. This recommendation follows the consensus statement: Guidelines for Management of Incidental Pulmonary Nodules Detected on CT Images: From the Fleischner Society 2017; Radiology 2017; 284:228-243.  Calcific atherosclerotic disease of the coronary arteries and aorta. Small hiatal hernia. Moderate enlargement of the prostate gland. Please correlate to patient's PSA values. Electronically Signed   By: Ted Mcalpine M.D.   On: 06/21/2016 18:41   Ct Cervical Spine Wo Contrast  Result Date: 06/21/2016 CLINICAL DATA:  Found on ground near ladder. Patient unresponsive. Concern for head, maxillofacial or cervical spine injury. Initial encounter. EXAM: CT HEAD WITHOUT CONTRAST CT MAXILLOFACIAL WITHOUT CONTRAST CT CERVICAL SPINE WITHOUT CONTRAST TECHNIQUE: Multidetector CT imaging of the head, cervical spine, and maxillofacial structures were performed using the standard protocol without intravenous contrast. Multiplanar CT image reconstructions of the cervical spine and maxillofacial structures were also generated. COMPARISON:  None. FINDINGS: CT HEAD FINDINGS Brain: There appears to be a small amount of subarachnoid hemorrhage along the sylvian fissures bilaterally, and at the high left parietal lobe and right frontal lobe. No evidence of acute infarction, hemorrhage, hydrocephalus, extra-axial collection or mass lesion/mass effect. Prominence of the ventricles and sulci reflects mild cortical volume loss. Mild cerebellar atrophy is noted. The brainstem and fourth ventricle are within normal limits. The basal ganglia are unremarkable in appearance. The cerebral hemispheres demonstrate grossly normal gray-white differentiation. No mass effect or midline shift is seen. Vascular: No hyperdense vessel or unexpected calcification. Skull: There is no evidence of fracture; visualized osseous structures are unremarkable in appearance. Other: Mild soft tissue swelling is noted lateral to the right orbit. CT MAXILLOFACIAL FINDINGS Osseous: There is no evidence of fracture or dislocation. The maxilla and mandible appear intact. The nasal bone is unremarkable in appearance. The visualized dentition demonstrates no acute  abnormality. Orbits: The orbits are intact bilaterally. Sinuses: Mucosal thickening is noted at the left maxillary sinus. The remaining visualized paranasal sinuses and mastoid air cells are well-aerated. Soft tissues: Soft tissue swelling is noted along the upper lip. Mild soft tissue injury is suggested at the anterior vertex. The parapharyngeal fat planes are preserved. The nasopharynx, oropharynx and hypopharynx are unremarkable in appearance. The visualized portions of the valleculae and piriform sinuses are grossly  unremarkable. The parotid and submandibular glands are within normal limits. No cervical lymphadenopathy is seen. CT CERVICAL SPINE FINDINGS Alignment: Normal. Skull base and vertebrae: No acute fracture. No primary bone lesion or focal pathologic process. There is a displaced fracture through the right mid clavicle. Soft tissues and spinal canal: No prevertebral fluid or swelling. No visible canal hematoma. Disc levels: Intervertebral disc spaces are preserved. Mild facet disease is noted at the mid cervical spine. Mild degenerative change is noted about the dens. Upper chest: Emphysema is noted at the lung apices. The thyroid gland is unremarkable in appearance. Mild calcification is noted at the left carotid bifurcation. Other: No additional soft tissue abnormalities are seen. IMPRESSION: 1. Mild acute subarachnoid hemorrhage along the sylvian fissures bilaterally, at the high left parietal lobe and right right frontal lobe. 2. No evidence of fracture or subluxation along the cervical spine. 3. Displaced fracture through the right mid clavicle. 4. No evidence of fracture or dislocation with regard to the maxillofacial structures. 5. Soft tissue swelling along the upper lip. Mild soft tissue injury suggested at the anterior vertex. Mild soft tissue swelling lateral to the right orbit. 6. Mild cortical volume loss. 7. Emphysema at the lung apices. 8. Mild calcification at the left carotid  bifurcation. Critical Value/emergent results were called by telephone at the time of interpretation on 06/21/2016 at 6:12 pm to Dr. Magnus Ivan, who verbally acknowledged these results. Electronically Signed   By: Roanna Raider M.D.   On: 06/21/2016 18:17   Mr Brain Wo Contrast  Result Date: 06/22/2016 CLINICAL DATA:  66 year old male found down next a ladder, presumed fall. Minimally responsive at presentation. Subarachnoid hemorrhage, chest and extremity fractures. EXAM: MRI HEAD WITHOUT CONTRAST TECHNIQUE: Multiplanar, multiecho pulse sequences of the brain and surrounding structures were obtained without intravenous contrast. COMPARISON:  Head CTs without contrast 06/22/2016 and earlier. FINDINGS: Brain: The small volume of subarachnoid hemorrhage identified by CT is evident as scattered increased sulcal FLAIR hyperintensity noted intermittently in both hemispheres (e.g. Series 8, image 22). There is a trace amount of hemorrhage layering in both occipital horns (series 9, image 52). A focus of abnormal diffusion in the left superior frontal gyrus and/or adjacent sulcus on series 3, image 40 does demonstrate associated hemorrhage on susceptibility imaging (series 9, image 84). Furthermore there arm multiple micro hemorrhages visible on susceptibility within the right anterior superior frontal lobe and frontal lobe white matter (such as on series 9, images 83 and 73) and also within the left occipital pole white matter (series 9, image 49). Possible recent microhemorrhage in the right thalamus (series 9, image 54). And also hemorrhage in the left quadrigeminal plate cistern (series 9, image 37) or less likely a small parenchymal contusion there are (series 8, image 10). No definite other parenchymal diffusion abnormality. No evidence of acute infarct. No ventriculomegaly. No midline shift, mass effect, or evidence of intracranial mass lesion. Basilar cisterns remain patent. Negative pituitary and cervicomedullary  junction. Corpus callosum and cerebellum appear normal. Vascular: Major intracranial vascular flow voids are preserved. Skull and upper cervical spine: Negative. Normal bone marrow signal. Sinuses/Orbits: Negative orbits soft tissues. Scattered mild paranasal sinus mucosal thickening. Other: Visible internal auditory structures appear normal. Mastoids are clear. Negative scalp soft tissues. IMPRESSION: 1. Positive for traumatic parenchymal brain injury with multiple microhemorrhages in the right frontal lobe, left occipital lobe, and right thalamus. Consider diffuse axonal injury. Small hemorrhagic contusion suspected in the left superior frontal gyrus, and possibly also in the left dorsal  midbrain. No associated cerebral edema or mass effect. 2. Trace subarachnoid and intraventricular hemorrhage. No ventriculomegaly. Electronically Signed   By: Odessa Fleming M.D.   On: 06/22/2016 14:28   Ct Abdomen Pelvis W Contrast  Result Date: 06/21/2016 CLINICAL DATA:  Patient found unresponsive near ladder. EXAM: CT CHEST, ABDOMEN, AND PELVIS WITH CONTRAST TECHNIQUE: Multidetector CT imaging of the chest, abdomen and pelvis was performed following the standard protocol during bolus administration of intravenous contrast. CONTRAST:  ISOVUE-300 IOPAMIDOL (ISOVUE-300) INJECTION 61% COMPARISON:  None. FINDINGS: CT CHEST FINDINGS Cardiovascular: Normal heart size. No pericardial effusion. Normal appearance of the great vessels. Calcific atherosclerotic disease of the coronary arteries. Mediastinum/Nodes: No enlarged mediastinal, hilar, or axillary lymph nodes. Thyroid gland, and trachea demonstrate no significant findings. Small hiatal hernia. Lungs/Pleura: Tiny right anterior pneumothorax. Contusion of the anterior and posterior aspect of the right lung, likely due to rib fractures. Mild to moderate emphysematous changes in the upper lobes. 5 mm left lower lobe subpleural pulmonary nodule. Musculoskeletal: Right-sided  posterolateral second through sixth rib fractures with mild displacement. Oblique right clavicular fracture. Compression deformity of the superior endplates of sixth and seventh thoracic vertebral bodies, with uncertain etiology. CT ABDOMEN PELVIS FINDINGS Hepatobiliary: No hepatic injury or perihepatic hematoma. Gallbladder is unremarkable Pancreas: Unremarkable. No pancreatic ductal dilatation or surrounding inflammatory changes. Spleen: Normal in size without focal abnormality. Adrenals/Urinary Tract: Adrenal glands are unremarkable. Kidneys are without renal calculi, focal lesion, or hydronephrosis. Bilateral renal cysts. Bladder is unremarkable. Stomach/Bowel: Stomach is within normal limits. Appendix appears normal. No evidence of bowel wall thickening, distention, or inflammatory changes. Vascular/Lymphatic: Aortic atherosclerosis. No enlarged abdominal or pelvic lymph nodes. Reproductive: Mildly enlarged prostate gland. Other: No abdominal wall hernia or abnormality. No abdominopelvic ascites. Musculoskeletal: Minimally displaced comminuted fracture of the inferior right sacrum with extension to the sacroiliac joint. Comminuted transverse fracture of the proximal right pubic ramus. IMPRESSION: Posterolateral, some displaced, right second through sixth rib fractures, with subsequent tiny right anterior pneumothorax and contusion of the subpleural posterior and anterior right lung parenchyma. Right clavicular fracture. Mildly displaced comminuted fracture of the inferior right sacrum with extension to the sacroiliac joint. Comminuted transverse fracture of the proximal right pubic ramus. Compression deformity of the superior endplates of the sixth and seventh thoracic vertebral bodies, with uncertain etiology. Given background of osteoarthritic changes this may represent discogenic endplate changes rather than acute fractures. No evidence of acute traumatic injury to the solid abdominal organs. Mild to  moderate upper lobe predominant emphysematous changes of the lungs. 5 mm left lower lobe subpleural pulmonary nodule. No follow-up needed if patient is low-risk. Non-contrast chest CT can be considered in 12 months if patient is high-risk. This recommendation follows the consensus statement: Guidelines for Management of Incidental Pulmonary Nodules Detected on CT Images: From the Fleischner Society 2017; Radiology 2017; 284:228-243. Calcific atherosclerotic disease of the coronary arteries and aorta. Small hiatal hernia. Moderate enlargement of the prostate gland. Please correlate to patient's PSA values. Electronically Signed   By: Ted Mcalpine M.D.   On: 06/21/2016 18:41   Dg Pelvis Portable  Result Date: 06/21/2016 CLINICAL DATA:  66 year old who fell from a 10 foot ladder earlier today. Initial encounter. EXAM: PORTABLE PELVIS 1-2 VIEWS COMPARISON:  None. FINDINGS: Comminuted, mildly displaced fracture involving the right superior pubic ramus. No other visible fractures. Sacroiliac joints and symphysis pubis intact without evidence of diastasis. Benign bone island in the roof of the right acetabulum. IMPRESSION: Acute traumatic mildly displaced fracture involving the  right superior pubic ramus. Electronically Signed   By: Hulan Saas M.D.   On: 06/21/2016 17:19   Ct T-spine No Charge  Result Date: 06/21/2016 CLINICAL DATA:  Found unresponsive. EXAM: CT THORACIC SPINE WITHOUT CONTRAST TECHNIQUE: Multidetector CT images of the thoracic were obtained using the standard protocol without intravenous contrast. COMPARISON:  None. FINDINGS: Alignment: Normal. Vertebrae: Mild compression deformity of the superior endplate of sixth and seventh thoracic vertebral bodies, on the background of degenerative changes may represent discogenic endplate changes rather than acute fractures. Paraspinal and other soft tissues: Negative. 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  IMPRESSION: Mild compression deformity of the superior endplates of sixth and seventh thoracic vertebral bodies, on the background of degenerative changes may represent discogenic endplate changes rather than acute compression fractures. Please correlate clinically. Right-sided rib fractures, right pulmonary contusion and tiny right pneumothorax described as part of the findings from chest abdomen pelvis CT. Oblique right clavicular fracture. Electronically Signed   By: Ted Mcalpine M.D.   On: 06/21/2016 18:44   Ct L-spine No Charge  Result Date: 06/21/2016 CLINICAL DATA:  Found unresponsive. EXAM: CT LUMBAR SPINE WITHOUT CONTRAST TECHNIQUE: Multidetector CT imaging of the lumbar spine was performed without intravenous contrast administration. Multiplanar CT image reconstructions were also generated. COMPARISON:  None. FINDINGS: Segmentation: 5 lumbar type vertebrae. Alignment: Normal. Vertebrae: No acute fracture or focal pathologic process. Paraspinal and other soft tissues: Negative. Disc levels: Mild multilevel osteoarthritic changes. Minimally displaced comminuted fracture of the inferior right sacrum with extension to the sacroiliac joint. IMPRESSION: No evidence of acute traumatic injury to the lumbar spine. Minimally displaced comminuted fracture of the inferior right sacrum and extension to the sacroiliac joint. Electronically Signed   By: Ted Mcalpine M.D.   On: 06/21/2016 18:46   Dg Chest Port 1 View  Result Date: 06/23/2016 CLINICAL DATA:  Chest trauma EXAM: PORTABLE CHEST 1 VIEW COMPARISON:  06/21/2016 FINDINGS: Mild bibasilar atelectasis has progressed in the interval. Minimal right effusion. Multiple displaced right rib fractures. Right clavicle fracture. No pneumothorax. IMPRESSION: Mild progression of bibasilar atelectasis and small right effusion. No pneumothorax. Electronically Signed   By: Marlan Palau M.D.   On: 06/23/2016 07:32   Dg Chest Port 1 View  Result Date:  06/21/2016 CLINICAL DATA:  66 year old who fell from a 10 foot ladder earlier today. Initial encounter. EXAM: PORTABLE CHEST 1 VIEW COMPARISON:  None. FINDINGS: Suboptimal inspiration accounts for crowded bronchovascular markings, especially in the bases, and accentuates the cardiac silhouette. Taking this into account, cardiac silhouette upper normal in size. Thoracic aorta mildly atherosclerotic. Slight widening of the superior mediastinum. Linear atelectasis in the right mid lung. Linear scar or atelectasis in the left mid lung. Comminuted fracture involving the right lateral second rib. No visible pneumothorax. IMPRESSION: 1. Suboptimal inspiration. Linear atelectasis in the right mid lung. Linear atelectasis or scarring left mid lung. 2. Comminuted fracture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

## 2016-06-23 NOTE — Progress Notes (Signed)
Subjective: Interval History: Family says he is talking a few words more often.  EEG - moderate generalized slowing  MRI Brain - scattered microhemorrhages in cortex and brainstem.  Objective: Vital signs in last 24 hours: Temp:  [98.6 F (37 C)-100.1 F (37.8 C)] 99.2 F (37.3 C) (05/12 0758) Pulse Rate:  [37-77] 51 (05/12 0700) Resp:  [14-20] 15 (05/12 0700) BP: (124-158)/(68-93) 130/75 (05/12 0700) SpO2:  [91 %-99 %] 99 % (05/12 0700)  Intake/Output from previous day: 05/11 0701 - 05/12 0700 In: 2675 [I.V.:2500] Out: 925 [Urine:925] Intake/Output this shift: No intake/output data recorded. Nutritional status: Diet NPO time specified  Neurologic Exam:  Cervical collar. Opens eyes to stimulation, but resists passive eye opening. Face appears symmetrical. Withdraws to pain in all extremities. Squeezes with hands on command. Gives occasional one word answers.   No babinski.  Lab Results:  Recent Labs  06/21/16 1659 06/21/16 1705 06/22/16 0237  WBC 26.3*  --  16.5*  HGB 16.6 16.3 15.1  HCT 47.8 48.0 44.1  PLT 229  --  201  NA 138 140 137  K 3.9 3.8 3.7  CL 103 103 105  CO2 23  --  22  GLUCOSE 167* 159* 133*  BUN 15 17 15   CREATININE 1.31* 1.20 1.10  CALCIUM 9.3  --  8.5*   Lipid Panel No results for input(s): CHOL, TRIG, HDL, CHOLHDL, VLDL, LDLCALC in the last 72 hours.  Studies/Results: Ct Head Without Contrast  Result Date: 06/22/2016 CLINICAL DATA:  66 y/o M; traumatic brain injury with subarachnoid hemorrhage. EXAM: CT HEAD WITHOUT CONTRAST TECHNIQUE: Contiguous axial images were obtained from the base of the skull through the vertex without intravenous contrast. COMPARISON:  06/21/2016 CT of the head FINDINGS: Brain: Stable small volume of subarachnoid hemorrhage in sylvian fissures, paramedian right frontal lobe, and parietal lobes. No evidence for new intracranial hemorrhage, large territory infarct, or mass effect. No effacement of basilar cisterns.  Vascular: No hyperdense vessel or unexpected calcification. Skull: Normal. Negative for fracture or focal lesion. Sinuses/Orbits: Mild diffuse paranasal sinus mucosal thickening. Normal aeration of mastoid air cells. New Other: None. IMPRESSION: Stable small volume of subarachnoid hemorrhage. No new acute intracranial abnormality. Electronically Signed   By: Mitzi Hansen M.D.   On: 06/22/2016 05:57   Ct Head Wo Contrast  Result Date: 06/21/2016 CLINICAL DATA:  Found on ground near ladder. Patient unresponsive. Concern for head, maxillofacial or cervical spine injury. Initial encounter. EXAM: CT HEAD WITHOUT CONTRAST CT MAXILLOFACIAL WITHOUT CONTRAST CT CERVICAL SPINE WITHOUT CONTRAST TECHNIQUE: Multidetector CT imaging of the head, cervical spine, and maxillofacial structures were performed using the standard protocol without intravenous contrast. Multiplanar CT image reconstructions of the cervical spine and maxillofacial structures were also generated. COMPARISON:  None. FINDINGS: CT HEAD FINDINGS Brain: There appears to be a small amount of subarachnoid hemorrhage along the sylvian fissures bilaterally, and at the high left parietal lobe and right frontal lobe. No evidence of acute infarction, hemorrhage, hydrocephalus, extra-axial collection or mass lesion/mass effect. Prominence of the ventricles and sulci reflects mild cortical volume loss. Mild cerebellar atrophy is noted. The brainstem and fourth ventricle are within normal limits. The basal ganglia are unremarkable in appearance. The cerebral hemispheres demonstrate grossly normal gray-white differentiation. No mass effect or midline shift is seen. Vascular: No hyperdense vessel or unexpected calcification. Skull: There is no evidence of fracture; visualized osseous structures are unremarkable in appearance. Other: Mild soft tissue swelling is noted lateral to the right orbit. CT MAXILLOFACIAL  FINDINGS Osseous: There is no evidence of  fracture or dislocation. The maxilla and mandible appear intact. The nasal bone is unremarkable in appearance. The visualized dentition demonstrates no acute abnormality. Orbits: The orbits are intact bilaterally. Sinuses: Mucosal thickening is noted at the left maxillary sinus. The remaining visualized paranasal sinuses and mastoid air cells are well-aerated. Soft tissues: Soft tissue swelling is noted along the upper lip. Mild soft tissue injury is suggested at the anterior vertex. The parapharyngeal fat planes are preserved. The nasopharynx, oropharynx and hypopharynx are unremarkable in appearance. The visualized portions of the valleculae and piriform sinuses are grossly unremarkable. The parotid and submandibular glands are within normal limits. No cervical lymphadenopathy is seen. CT CERVICAL SPINE FINDINGS Alignment: Normal. Skull base and vertebrae: No acute fracture. No primary bone lesion or focal pathologic process. There is a displaced fracture through the right mid clavicle. Soft tissues and spinal canal: No prevertebral fluid or swelling. No visible canal hematoma. Disc levels: Intervertebral disc spaces are preserved. Mild facet disease is noted at the mid cervical spine. Mild degenerative change is noted about the dens. Upper chest: Emphysema is noted at the lung apices. The thyroid gland is unremarkable in appearance. Mild calcification is noted at the left carotid bifurcation. Other: No additional soft tissue abnormalities are seen. IMPRESSION: 1. Mild acute subarachnoid hemorrhage along the sylvian fissures bilaterally, at the high left parietal lobe and right right frontal lobe. 2. No evidence of fracture or subluxation along the cervical spine. 3. Displaced fracture through the right mid clavicle. 4. No evidence of fracture or dislocation with regard to the maxillofacial structures. 5. Soft tissue swelling along the upper lip. Mild soft tissue injury suggested at the anterior vertex. Mild soft  tissue swelling lateral to the right orbit. 6. Mild cortical volume loss. 7. Emphysema at the lung apices. 8. Mild calcification at the left carotid bifurcation. Critical Value/emergent results were called by telephone at the time of interpretation on 06/21/2016 at 6:12 pm to Dr. Magnus Ivan, who verbally acknowledged these results. Electronically Signed   By: Roanna Raider M.D.   On: 06/21/2016 18:17   Ct Chest W Contrast  Result Date: 06/21/2016 CLINICAL DATA:  Patient found unresponsive near ladder. EXAM: CT CHEST, ABDOMEN, AND PELVIS WITH CONTRAST TECHNIQUE: Multidetector CT imaging of the chest, abdomen and pelvis was performed following the standard protocol during bolus administration of intravenous contrast. CONTRAST:  ISOVUE-300 IOPAMIDOL (ISOVUE-300) INJECTION 61% COMPARISON:  None. FINDINGS: CT CHEST FINDINGS Cardiovascular: Normal heart size. No pericardial effusion. Normal appearance of the great vessels. Calcific atherosclerotic disease of the coronary arteries. Mediastinum/Nodes: No enlarged mediastinal, hilar, or axillary lymph nodes. Thyroid gland, and trachea demonstrate no significant findings. Small hiatal hernia. Lungs/Pleura: Tiny right anterior pneumothorax. Contusion of the anterior and posterior aspect of the right lung, likely due to rib fractures. Mild to moderate emphysematous changes in the upper lobes. 5 mm left lower lobe subpleural pulmonary nodule. Musculoskeletal: Right-sided posterolateral second through sixth rib fractures with mild displacement. Oblique right clavicular fracture. Compression deformity of the superior endplates of sixth and seventh thoracic vertebral bodies, with uncertain etiology. CT ABDOMEN PELVIS FINDINGS Hepatobiliary: No hepatic injury or perihepatic hematoma. Gallbladder is unremarkable Pancreas: Unremarkable. No pancreatic ductal dilatation or surrounding inflammatory changes. Spleen: Normal in size without focal abnormality. Adrenals/Urinary Tract:  Adrenal glands are unremarkable. Kidneys are without renal calculi, focal lesion, or hydronephrosis. Bilateral renal cysts. Bladder is unremarkable. Stomach/Bowel: Stomach is within normal limits. Appendix appears normal. No evidence of bowel  wall thickening, distention, or inflammatory changes. Vascular/Lymphatic: Aortic atherosclerosis. No enlarged abdominal or pelvic lymph nodes. Reproductive: Mildly enlarged prostate gland. Other: No abdominal wall hernia or abnormality. No abdominopelvic ascites. Musculoskeletal: Minimally displaced comminuted fracture of the inferior right sacrum with extension to the sacroiliac joint. Comminuted transverse fracture of the proximal right pubic ramus. IMPRESSION: Posterolateral, some displaced, right second through sixth rib fractures, with subsequent tiny right anterior pneumothorax and contusion of the subpleural posterior and anterior right lung parenchyma. Right clavicular fracture. Mildly displaced comminuted fracture of the inferior right sacrum with extension to the sacroiliac joint. Comminuted transverse fracture of the proximal right pubic ramus. Compression deformity of the superior endplates of the sixth and seventh thoracic vertebral bodies, with uncertain etiology. Given background of osteoarthritic changes this may represent discogenic endplate changes rather than acute fractures. No evidence of acute traumatic injury to the solid abdominal organs. Mild to moderate upper lobe predominant emphysematous changes of the lungs. 5 mm left lower lobe subpleural pulmonary nodule. No follow-up needed if patient is low-risk. Non-contrast chest CT can be considered in 12 months if patient is high-risk. This recommendation follows the consensus statement: Guidelines for Management of Incidental Pulmonary Nodules Detected on CT Images: From the Fleischner Society 2017; Radiology 2017; 284:228-243. Calcific atherosclerotic disease of the coronary arteries and aorta. Small  hiatal hernia. Moderate enlargement of the prostate gland. Please correlate to patient's PSA values. Electronically Signed   By: Ted Mcalpine M.D.   On: 06/21/2016 18:41   Ct Cervical Spine Wo Contrast  Result Date: 06/21/2016 CLINICAL DATA:  Found on ground near ladder. Patient unresponsive. Concern for head, maxillofacial or cervical spine injury. Initial encounter. EXAM: CT HEAD WITHOUT CONTRAST CT MAXILLOFACIAL WITHOUT CONTRAST CT CERVICAL SPINE WITHOUT CONTRAST TECHNIQUE: Multidetector CT imaging of the head, cervical spine, and maxillofacial structures were performed using the standard protocol without intravenous contrast. Multiplanar CT image reconstructions of the cervical spine and maxillofacial structures were also generated. COMPARISON:  None. FINDINGS: CT HEAD FINDINGS Brain: There appears to be a small amount of subarachnoid hemorrhage along the sylvian fissures bilaterally, and at the high left parietal lobe and right frontal lobe. No evidence of acute infarction, hemorrhage, hydrocephalus, extra-axial collection or mass lesion/mass effect. Prominence of the ventricles and sulci reflects mild cortical volume loss. Mild cerebellar atrophy is noted. The brainstem and fourth ventricle are within normal limits. The basal ganglia are unremarkable in appearance. The cerebral hemispheres demonstrate grossly normal gray-white differentiation. No mass effect or midline shift is seen. Vascular: No hyperdense vessel or unexpected calcification. Skull: There is no evidence of fracture; visualized osseous structures are unremarkable in appearance. Other: Mild soft tissue swelling is noted lateral to the right orbit. CT MAXILLOFACIAL FINDINGS Osseous: There is no evidence of fracture or dislocation. The maxilla and mandible appear intact. The nasal bone is unremarkable in appearance. The visualized dentition demonstrates no acute abnormality. Orbits: The orbits are intact bilaterally. Sinuses: Mucosal  thickening is noted at the left maxillary sinus. The remaining visualized paranasal sinuses and mastoid air cells are well-aerated. Soft tissues: Soft tissue swelling is noted along the upper lip. Mild soft tissue injury is suggested at the anterior vertex. The parapharyngeal fat planes are preserved. The nasopharynx, oropharynx and hypopharynx are unremarkable in appearance. The visualized portions of the valleculae and piriform sinuses are grossly unremarkable. The parotid and submandibular glands are within normal limits. No cervical lymphadenopathy is seen. CT CERVICAL SPINE FINDINGS Alignment: Normal. Skull base and vertebrae: No acute fracture. No  primary bone lesion or focal pathologic process. There is a displaced fracture through the right mid clavicle. Soft tissues and spinal canal: No prevertebral fluid or swelling. No visible canal hematoma. Disc levels: Intervertebral disc spaces are preserved. Mild facet disease is noted at the mid cervical spine. Mild degenerative change is noted about the dens. Upper chest: Emphysema is noted at the lung apices. The thyroid gland is unremarkable in appearance. Mild calcification is noted at the left carotid bifurcation. Other: No additional soft tissue abnormalities are seen. IMPRESSION: 1. Mild acute subarachnoid hemorrhage along the sylvian fissures bilaterally, at the high left parietal lobe and right right frontal lobe. 2. No evidence of fracture or subluxation along the cervical spine. 3. Displaced fracture through the right mid clavicle. 4. No evidence of fracture or dislocation with regard to the maxillofacial structures. 5. Soft tissue swelling along the upper lip. Mild soft tissue injury suggested at the anterior vertex. Mild soft tissue swelling lateral to the right orbit. 6. Mild cortical volume loss. 7. Emphysema at the lung apices. 8. Mild calcification at the left carotid bifurcation. Critical Value/emergent results were called by telephone at the time  of interpretation on 06/21/2016 at 6:12 pm to Dr. Magnus Ivan, who verbally acknowledged these results. Electronically Signed   By: Roanna Raider M.D.   On: 06/21/2016 18:17   Mr Brain Wo Contrast  Result Date: 06/22/2016 CLINICAL DATA:  66 year old male found down next a ladder, presumed fall. Minimally responsive at presentation. Subarachnoid hemorrhage, chest and extremity fractures. EXAM: MRI HEAD WITHOUT CONTRAST TECHNIQUE: Multiplanar, multiecho pulse sequences of the brain and surrounding structures were obtained without intravenous contrast. COMPARISON:  Head CTs without contrast 06/22/2016 and earlier. FINDINGS: Brain: The small volume of subarachnoid hemorrhage identified by CT is evident as scattered increased sulcal FLAIR hyperintensity noted intermittently in both hemispheres (e.g. Series 8, image 22). There is a trace amount of hemorrhage layering in both occipital horns (series 9, image 52). A focus of abnormal diffusion in the left superior frontal gyrus and/or adjacent sulcus on series 3, image 40 does demonstrate associated hemorrhage on susceptibility imaging (series 9, image 84). Furthermore there arm multiple micro hemorrhages visible on susceptibility within the right anterior superior frontal lobe and frontal lobe white matter (such as on series 9, images 83 and 73) and also within the left occipital pole white matter (series 9, image 49). Possible recent microhemorrhage in the right thalamus (series 9, image 54). And also hemorrhage in the left quadrigeminal plate cistern (series 9, image 37) or less likely a small parenchymal contusion there are (series 8, image 10). No definite other parenchymal diffusion abnormality. No evidence of acute infarct. No ventriculomegaly. No midline shift, mass effect, or evidence of intracranial mass lesion. Basilar cisterns remain patent. Negative pituitary and cervicomedullary junction. Corpus callosum and cerebellum appear normal. Vascular: Major  intracranial vascular flow voids are preserved. Skull and upper cervical spine: Negative. Normal bone marrow signal. Sinuses/Orbits: Negative orbits soft tissues. Scattered mild paranasal sinus mucosal thickening. Other: Visible internal auditory structures appear normal. Mastoids are clear. Negative scalp soft tissues. IMPRESSION: 1. Positive for traumatic parenchymal brain injury with multiple microhemorrhages in the right frontal lobe, left occipital lobe, and right thalamus. Consider diffuse axonal injury. Small hemorrhagic contusion suspected in the left superior frontal gyrus, and possibly also in the left dorsal midbrain. No associated cerebral edema or mass effect. 2. Trace subarachnoid and intraventricular hemorrhage. No ventriculomegaly. Electronically Signed   By: Odessa Fleming M.D.   On: 06/22/2016  14:28   Ct Abdomen Pelvis W Contrast  Result Date: 06/21/2016 CLINICAL DATA:  Patient found unresponsive near ladder. EXAM: CT CHEST, ABDOMEN, AND PELVIS WITH CONTRAST TECHNIQUE: Multidetector CT imaging of the chest, abdomen and pelvis was performed following the standard protocol during bolus administration of intravenous contrast. CONTRAST:  ISOVUE-300 IOPAMIDOL (ISOVUE-300) INJECTION 61% COMPARISON:  None. FINDINGS: CT CHEST FINDINGS Cardiovascular: Normal heart size. No pericardial effusion. Normal appearance of the great vessels. Calcific atherosclerotic disease of the coronary arteries. Mediastinum/Nodes: No enlarged mediastinal, hilar, or axillary lymph nodes. Thyroid gland, and trachea demonstrate no significant findings. Small hiatal hernia. Lungs/Pleura: Tiny right anterior pneumothorax. Contusion of the anterior and posterior aspect of the right lung, likely due to rib fractures. Mild to moderate emphysematous changes in the upper lobes. 5 mm left lower lobe subpleural pulmonary nodule. Musculoskeletal: Right-sided posterolateral second through sixth rib fractures with mild displacement.  Oblique right clavicular fracture. Compression deformity of the superior endplates of sixth and seventh thoracic vertebral bodies, with uncertain etiology. CT ABDOMEN PELVIS FINDINGS Hepatobiliary: No hepatic injury or perihepatic hematoma. Gallbladder is unremarkable Pancreas: Unremarkable. No pancreatic ductal dilatation or surrounding inflammatory changes. Spleen: Normal in size without focal abnormality. Adrenals/Urinary Tract: Adrenal glands are unremarkable. Kidneys are without renal calculi, focal lesion, or hydronephrosis. Bilateral renal cysts. Bladder is unremarkable. Stomach/Bowel: Stomach is within normal limits. Appendix appears normal. No evidence of bowel wall thickening, distention, or inflammatory changes. Vascular/Lymphatic: Aortic atherosclerosis. No enlarged abdominal or pelvic lymph nodes. Reproductive: Mildly enlarged prostate gland. Other: No abdominal wall hernia or abnormality. No abdominopelvic ascites. Musculoskeletal: Minimally displaced comminuted fracture of the inferior right sacrum with extension to the sacroiliac joint. Comminuted transverse fracture of the proximal right pubic ramus. IMPRESSION: Posterolateral, some displaced, right second through sixth rib fractures, with subsequent tiny right anterior pneumothorax and contusion of the subpleural posterior and anterior right lung parenchyma. Right clavicular fracture. Mildly displaced comminuted fracture of the inferior right sacrum with extension to the sacroiliac joint. Comminuted transverse fracture of the proximal right pubic ramus. Compression deformity of the superior endplates of the sixth and seventh thoracic vertebral bodies, with uncertain etiology. Given background of osteoarthritic changes this may represent discogenic endplate changes rather than acute fractures. No evidence of acute traumatic injury to the solid abdominal organs. Mild to moderate upper lobe predominant emphysematous changes of the lungs. 5 mm left  lower lobe subpleural pulmonary nodule. No follow-up needed if patient is low-risk. Non-contrast chest CT can be considered in 12 months if patient is high-risk. This recommendation follows the consensus statement: Guidelines for Management of Incidental Pulmonary Nodules Detected on CT Images: From the Fleischner Society 2017; Radiology 2017; 284:228-243. Calcific atherosclerotic disease of the coronary arteries and aorta. Small hiatal hernia. Moderate enlargement of the prostate gland. Please correlate to patient's PSA values. Electronically Signed   By: Ted Mcalpine M.D.   On: 06/21/2016 18:41   Dg Pelvis Portable  Result Date: 06/21/2016 CLINICAL DATA:  66 year old who fell from a 10 foot ladder earlier today. Initial encounter. EXAM: PORTABLE PELVIS 1-2 VIEWS COMPARISON:  None. FINDINGS: Comminuted, mildly displaced fracture involving the right superior pubic ramus. No other visible fractures. Sacroiliac joints and symphysis pubis intact without evidence of diastasis. Benign bone island in the roof of the right acetabulum. IMPRESSION: Acute traumatic mildly displaced fracture involving the right superior pubic ramus. Electronically Signed   By: Hulan Saas M.D.   On: 06/21/2016 17:19   Ct T-spine No Charge  Result Date: 06/21/2016 CLINICAL  DATA:  Found unresponsive. EXAM: CT THORACIC SPINE WITHOUT CONTRAST TECHNIQUE: Multidetector CT images of the thoracic were obtained using the standard protocol without intravenous contrast. COMPARISON:  None. FINDINGS: Alignment: Normal. Vertebrae: Mild compression deformity of the superior endplate of sixth and seventh thoracic vertebral bodies, on the background of degenerative changes may represent discogenic endplate changes rather than acute fractures. Paraspinal and other soft tissues: Negative. 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 IMPRESSION: Mild compression deformity of the superior endplates of sixth and seventh  thoracic vertebral bodies, on the background of degenerative changes may represent discogenic endplate changes rather than acute compression fractures. Please correlate clinically. Right-sided rib fractures, right pulmonary contusion and tiny right pneumothorax described as part of the findings from chest abdomen pelvis CT. Oblique right clavicular fracture. Electronically Signed   By: Ted Mcalpine M.D.   On: 06/21/2016 18:44   Ct L-spine No Charge  Result Date: 06/21/2016 CLINICAL DATA:  Found unresponsive. EXAM: CT LUMBAR SPINE WITHOUT CONTRAST TECHNIQUE: Multidetector CT imaging of the lumbar spine was performed without intravenous contrast administration. Multiplanar CT image reconstructions were also generated. COMPARISON:  None. FINDINGS: Segmentation: 5 lumbar type vertebrae. Alignment: Normal. Vertebrae: No acute fracture or focal pathologic process. Paraspinal and other soft tissues: Negative. Disc levels: Mild multilevel osteoarthritic changes. Minimally displaced comminuted fracture of the inferior right sacrum with extension to the sacroiliac joint. IMPRESSION: No evidence of acute traumatic injury to the lumbar spine. Minimally displaced comminuted fracture of the inferior right sacrum and extension to the sacroiliac joint. Electronically Signed   By: Ted Mcalpine M.D.   On: 06/21/2016 18:46   Dg Chest Port 1 View  Result Date: 06/23/2016 CLINICAL DATA:  Chest trauma EXAM: PORTABLE CHEST 1 VIEW COMPARISON:  06/21/2016 FINDINGS: Mild bibasilar atelectasis has progressed in the interval. Minimal right effusion. Multiple displaced right rib fractures. Right clavicle fracture. No pneumothorax. IMPRESSION: Mild progression of bibasilar atelectasis and small right effusion. No pneumothorax. Electronically Signed   By: Marlan Palau M.D.   On: 06/23/2016 07:32   Dg Chest Port 1 View  Result Date: 06/21/2016 CLINICAL DATA:  66 year old who fell from a 10 foot ladder earlier today.  Initial encounter. EXAM: PORTABLE CHEST 1 VIEW COMPARISON:  None. FINDINGS: Suboptimal inspiration accounts for crowded bronchovascular markings, especially in the bases, and accentuates the cardiac silhouette. Taking this into account, cardiac silhouette upper normal in size. Thoracic aorta mildly atherosclerotic. Slight widening of the superior mediastinum. Linear atelectasis in the right mid lung. Linear scar or atelectasis in the left mid lung. Comminuted fracture involving the right lateral second rib. No visible pneumothorax. IMPRESSION: 1. Suboptimal inspiration. Linear atelectasis in the right mid lung. Linear atelectasis or scarring left mid lung. 2. Comminuted fracture involving the right lateral second rib. No

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
## 2016-06-23 NOTE — Progress Notes (Signed)
Pt seen and examined. No issues overnight.   EXAM: Temp:  [98.6 F (37 C)-100.1 F (37.8 C)] 99.2 F (37.3 C) (05/12 0758) Pulse Rate:  [37-77] 51 (05/12 0700) Resp:  [14-20] 15 (05/12 0700) BP: (124-158)/(68-93) 130/75 (05/12 0700) SpO2:  [91 %-99 %] 99 % (05/12 0700) Intake/Output      05/11 0701 - 05/12 0700 05/12 0701 - 05/13 0700   P.O.     I.V. (mL/kg) 2500 (31)    Other 175    Total Intake(mL/kg) 2675 (33.1)    Urine (mL/kg/hr) 925 (0.5)    Total Output 925     Net +1750           Sleeping but arouses easily, oriented to name Sparse words Follows commands throughout  LABS: Lab Results  Component Value Date   CREATININE 1.10 06/22/2016   BUN 15 06/22/2016   NA 137 06/22/2016   K 3.7 06/22/2016   CL 105 06/22/2016   CO2 22 06/22/2016   Lab Results  Component Value Date   WBC 16.5 (H) 06/22/2016   HGB 15.1 06/22/2016   HCT 44.1 06/22/2016   MCV 93.0 06/22/2016   PLT 201 06/22/2016    IMAGING: MRI consistent with brain injury; no ischemia.  IMPRESSION: - 66 y.o. male with moderate traumatic brain injury; improving.  PLAN: - Continue supportive care - No role for surgical intervention - Should improve with time

## 2016-06-23 NOTE — Progress Notes (Signed)
OT Cancellation Note  Patient Details Name: Tyler AskewDavid Manigo MRN: [id number] DOB: 01-13-1951   Cancelled Treatment:    Reason Eval/Treat Not Completed: Patient not medically ready (active bedrest orders). Will follow up as time allows.  Gaye AlkenBailey A Mikhayla Phillis M.S., OTR/L Pager: [phone]  06/23/2016, 7:45 AM

## 2016-06-23 NOTE — Progress Notes (Signed)
Inpatient Rehabilitation  ST has evaluated pt. and anticipates pt. will need  CIR when he is more medically ready.  PT and OT consults are pending.  Pt. will likely require IP Rehab.  Please order consult when appropriate and following completion of PT/OT consults.    Weldon PickingSusan Joan Avetisyan PT Inpatient Rehab Admissions Coordinator Cell [phone] Office [phone] a

## 2016-06-24 ENCOUNTER — Inpatient Hospital Stay (HOSPITAL_COMMUNITY): Payer: Medicare HMO

## 2016-06-24 MED ORDER — LORAZEPAM 2 MG/ML IJ SOLN
INTRAMUSCULAR | Status: AC
  Administered 2016-06-24: 1 mg via INTRAVENOUS
  Filled 2016-06-24: qty 1

## 2016-06-24 MED ORDER — AMANTADINE HCL 100 MG PO CAPS
100.0000 mg | ORAL_CAPSULE | Freq: Two times a day (BID) | ORAL | Status: DC
  Administered 2016-06-24 – 2016-06-26 (×5): 100 mg via ORAL
  Filled 2016-06-24 (×7): qty 1

## 2016-06-24 MED ORDER — ACETAMINOPHEN 650 MG RE SUPP
650.0000 mg | RECTAL | Status: DC | PRN
  Administered 2016-06-24: 650 mg via RECTAL
  Filled 2016-06-24: qty 1

## 2016-06-24 MED ORDER — LORAZEPAM 2 MG/ML IJ SOLN
0.5000 mg | INTRAMUSCULAR | Status: DC | PRN
  Administered 2016-06-24 – 2016-06-26 (×4): 1 mg via INTRAVENOUS
  Filled 2016-06-24 (×4): qty 1

## 2016-06-24 NOTE — Progress Notes (Signed)
PT Cancellation Note  Patient Details Name: Tyler AskewDavid Girdler MRN: [id number] DOB: Apr 12, 1950   Cancelled Treatment:    Reason Eval/Treat Not Completed: Patient not medically ready Pt on bedrest. Will await increase in activity orders prior to PT evaluation. Will plan for TBI team to evaluate then.   Blake DivineShauna A Ashya Nicolaisen 06/24/2016, 6:54 AM Mylo RedShauna Mildred Bollard, PT, DPT [phone]

## 2016-06-24 NOTE — Progress Notes (Signed)
All lung fields rhonchus to auscultation anteriorly, with coarse crackles in left lower lobe.  Patient's O2 saturation 93% on 5L O2.  Attending MD notified and new orders received for CXR.  Will continue to monitor. [place], Mitzi HansenJessica Marie

## 2016-06-24 NOTE — Progress Notes (Signed)
  Speech Language Pathology Treatment: Dysphagia  Patient Details Name: Tyler AskewDavid Lane MRN: [id number] DOB: [birth year] Today's Date: 06/24/2016 Time: 1030-1050 SLP Time Calculation (min) (ACUTE ONLY): 20 min  Assessment / Plan / Recommendation Clinical Impression  Pt seen for dysphagia treatment. Pt remains confused, inappropriate, responds to pain during repositioning for PO trials. Following commands inconsistently. Pt is alert and does respond verbally with his name; disoriented to time, location and situation. Pt observed with whole capsule in right buccal cavity upon inspection of oral cavity, which was removed. Performed oral care to maximize safety for PO trials; pt allows minimal oral care, pursing lips despite verbal, tactile cues. Retrieves ice chips and purees via teaspoon with verbal and tactile cues, and is able to retrieve thin liquids via cup sip with verbal and tactile cues for lip closure. No overt signs of aspiration noted. Given pt's current mentation, recommend continuing NPO; essential medications should be crushed, not whole, given pt's pocketing; please check oral cavity after administering medications.  HPI        SLP Plan  Continue with current plan of care       Recommendations  Diet recommendations: NPO Medication Administration: Crushed with puree Supervision: Trained caregiver to feed patient;Full supervision/cueing for compensatory strategies Compensations: Minimize environmental distractions;Other (Comment) (check for pocketing, holding of POs)                Oral Care Recommendations: Oral care BID Follow up Recommendations: 24 hour supervision/assistance;Inpatient Rehab SLP Visit Diagnosis: Dysphagia, oropharyngeal phase (R13.12) Plan: Continue with current plan of care       GO             Tyler BatonMary Beth Athira Janowicz, MS, CCC-SLP Speech-Language Pathologist [phone]   Tyler Lane 06/24/2016, 1:43 PM
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
## 2016-06-24 NOTE — Progress Notes (Signed)
Patient ID: Tyler Lane, male   DOB: 10/13/1950, 65 y.o.   MRN: [id number] [facility] Surgery Progress Note:   * No surgery found *  Subjective: Mental status is minimally responsive; not with verbal and nonpurposeful Objective: Vital signs in last 24 hours: Temp:  [98.6 F (37 C)-100 F (37.8 C)] 98.6 F (37 C) (05/13 0755) Pulse Rate:  [49-80] 55 (05/13 0700) Resp:  [16-25] 21 (05/13 1100) BP: (116-153)/(61-87) 136/73 (05/13 1100) SpO2:  [91 %-100 %] 93 % (05/13 0700)  Intake/Output from previous day: 05/12 0701 - 05/13 0700 In: 2400 [I.V.:2400] Out: 1700 [Urine:1700] Intake/Output this shift: Total I/O In: 400 [I.V.:400] Out: 350 [Urine:350]  Physical Exam: Work of breathing is not labored.    Lab Results:  No results found for this or any previous visit (from the past 48 hour(s)).  Radiology/Results: Mr Sherrin Daisy Contrast  Result Date: 06/22/2016 CLINICAL DATA:  66 year old male found down next a ladder, presumed fall. Minimally responsive at presentation. Subarachnoid hemorrhage, chest and extremity fractures. EXAM: MRI HEAD WITHOUT CONTRAST TECHNIQUE: Multiplanar, multiecho pulse sequences of the brain and surrounding structures were obtained without intravenous contrast. COMPARISON:  Head CTs without contrast 06/22/2016 and earlier. FINDINGS: Brain: The small volume of subarachnoid hemorrhage identified by CT is evident as scattered increased sulcal FLAIR hyperintensity noted intermittently in both hemispheres (e.g. Series 8, image 22). There is a trace amount of hemorrhage layering in both occipital horns (series 9, image 52). A focus of abnormal diffusion in the left superior frontal gyrus and/or adjacent sulcus on series 3, image 40 does demonstrate associated hemorrhage on susceptibility imaging (series 9, image 84). Furthermore there arm multiple micro hemorrhages visible on susceptibility within the right anterior superior frontal lobe and frontal lobe white matter  (such as on series 9, images 83 and 73) and also within the left occipital pole white matter (series 9, image 49). Possible recent microhemorrhage in the right thalamus (series 9, image 54). And also hemorrhage in the left quadrigeminal plate cistern (series 9, image 37) or less likely a small parenchymal contusion there are (series 8, image 10). No definite other parenchymal diffusion abnormality. No evidence of acute infarct. No ventriculomegaly. No midline shift, mass effect, or evidence of intracranial mass lesion. Basilar cisterns remain patent. Negative pituitary and cervicomedullary junction. Corpus callosum and cerebellum appear normal. Vascular: Major intracranial vascular flow voids are preserved. Skull and upper cervical spine: Negative. Normal bone marrow signal. Sinuses/Orbits: Negative orbits soft tissues. Scattered mild paranasal sinus mucosal thickening. Other: Visible internal auditory structures appear normal. Mastoids are clear. Negative scalp soft tissues. IMPRESSION: 1. Positive for traumatic parenchymal brain injury with multiple microhemorrhages in the right frontal lobe, left occipital lobe, and right thalamus. Consider diffuse axonal injury. Small hemorrhagic contusion suspected in the left superior frontal gyrus, and possibly also in the left dorsal midbrain. No associated cerebral edema or mass effect. 2. Trace subarachnoid and intraventricular hemorrhage. No ventriculomegaly. Electronically Signed   By: Odessa Fleming M.D.   On: 06/22/2016 14:28   Dg Chest Port 1 View  Result Date: 06/23/2016 CLINICAL DATA:  Chest trauma EXAM: PORTABLE CHEST 1 VIEW COMPARISON:  06/21/2016 FINDINGS: Mild bibasilar atelectasis has progressed in the interval. Minimal right effusion. Multiple displaced right rib fractures. Right clavicle fracture. No pneumothorax. IMPRESSION: Mild progression of bibasilar atelectasis and small right effusion. No pneumothorax. Electronically Signed   By: Marlan Palau M.D.   On:  06/23/2016 07:32    Anti-infectives: Anti-infectives    None  Assessment/Plan: Problem List: Patient Active Problem List   Diagnosis Date Noted  . Fracture of clavicle, right, closed 06/22/2016  . TBI (traumatic brain injury) (HCC) 06/21/2016    Significant TBI in an older man with slow recovery * No surgery found *    LOS: 3 days   Matt B. Daphine DeutscherMartin, MD, [facility] CenterFACS  [facility] [phone] beeper [phone]  06/24/2016 12:04 PM

## 2016-06-24 NOTE — Progress Notes (Signed)
Subjective: Interval History: No major changes.  Objective: Vital signs in last 24 hours: Temp:  [98.6 F (37 C)-100.5 F (38.1 C)] 98.6 F (37 C) (05/13 0755) Pulse Rate:  [49-80] 55 (05/13 0700) Resp:  [16-25] 20 (05/13 0800) BP: (116-153)/(61-88) 117/61 (05/13 0800) SpO2:  [91 %-100 %] 93 % (05/13 0700)  Intake/Output from previous day: 05/12 0701 - 05/13 0700 In: 2400 [I.V.:2400] Out: 1700 [Urine:1700] Intake/Output this shift: Total I/O In: 100 [I.V.:100] Out: -  Nutritional status: Diet NPO time specified Except for: Other (See Comments)  Neurologic Exam:  Awakens to voice.  Opens eyes slightly.  Resists passive eye opening.  Follow some basic commands such as sticking out tongue and moving arms and legs.  Verbalizes and grimaces to pain in all 4 extremities.  No babinski. Responds with yes and no answers only.  No long verbal output.   Lab Results:  Recent Labs  06/21/16 1659 06/21/16 1705 06/22/16 0237  WBC 26.3*  --  16.5*  HGB 16.6 16.3 15.1  HCT 47.8 48.0 44.1  PLT 229  --  201  NA 138 140 137  K 3.9 3.8 3.7  CL 103 103 105  CO2 23  --  22  GLUCOSE 167* 159* 133*  BUN 15 17 15   CREATININE 1.31* 1.20 1.10  CALCIUM 9.3  --  8.5*   Lipid Panel No results for input(s): CHOL, TRIG, HDL, CHOLHDL, VLDL, LDLCALC in the last 72 hours.  Studies/Results: Mr Brain Wo Contrast  Result Date: 06/22/2016 CLINICAL DATA:  66 year old male found down next a ladder, presumed fall. Minimally responsive at presentation. Subarachnoid hemorrhage, chest and extremity fractures. EXAM: MRI HEAD WITHOUT CONTRAST TECHNIQUE: Multiplanar, multiecho pulse sequences of the brain and surrounding structures were obtained without intravenous contrast. COMPARISON:  Head CTs without contrast 06/22/2016 and earlier. FINDINGS: Brain: The small volume of subarachnoid hemorrhage identified by CT is evident as scattered increased sulcal FLAIR hyperintensity noted intermittently in both  hemispheres (e.g. Series 8, image 22). There is a trace amount of hemorrhage layering in both occipital horns (series 9, image 52). A focus of abnormal diffusion in the left superior frontal gyrus and/or adjacent sulcus on series 3, image 40 does demonstrate associated hemorrhage on susceptibility imaging (series 9, image 84). Furthermore there arm multiple micro hemorrhages visible on susceptibility within the right anterior superior frontal lobe and frontal lobe white matter (such as on series 9, images 83 and 73) and also within the left occipital pole white matter (series 9, image 49). Possible recent microhemorrhage in the right thalamus (series 9, image 54). And also hemorrhage in the left quadrigeminal plate cistern (series 9, image 37) or less likely a small parenchymal contusion there are (series 8, image 10). No definite other parenchymal diffusion abnormality. No evidence of acute infarct. No ventriculomegaly. No midline shift, mass effect, or evidence of intracranial mass lesion. Basilar cisterns remain patent. Negative pituitary and cervicomedullary junction. Corpus callosum and cerebellum appear normal. Vascular: Major intracranial vascular flow voids are preserved. Skull and upper cervical spine: Negative. Normal bone marrow signal. Sinuses/Orbits: Negative orbits soft tissues. Scattered mild paranasal sinus mucosal thickening. Other: Visible internal auditory structures appear normal. Mastoids are clear. Negative scalp soft tissues. IMPRESSION: 1. Positive for traumatic parenchymal brain injury with multiple microhemorrhages in the right frontal lobe, left occipital lobe, and right thalamus. Consider diffuse axonal injury. Small hemorrhagic contusion suspected in the left superior frontal gyrus, and possibly also in the left dorsal midbrain. No associated cerebral edema or  mass effect. 2. Trace subarachnoid and intraventricular hemorrhage. No ventriculomegaly. Electronically Signed   By: Odessa Fleming M.D.    On: 06/22/2016 14:28   Dg Chest Port 1 View  Result Date: 06/23/2016 CLINICAL DATA:  Chest trauma EXAM: PORTABLE CHEST 1 VIEW COMPARISON:  06/21/2016 FINDINGS: Mild bibasilar atelectasis has progressed in the interval. Minimal right effusion. Multiple displaced right rib fractures. Right clavicle fracture. No pneumothorax. IMPRESSION: Mild progression of bibasilar atelectasis and small right effusion. No pneumothorax. Electronically Signed   By: Marlan Palau M.D.   On: 06/23/2016 07:32    Medications:  Scheduled: . chlorhexidine  15 mL Mouth Rinse BID  . mouth rinse  15 mL Mouth Rinse q12n4p  . 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  Speech therapy has made NPO except meds crushed in puree.  I will try to initiate Amantadine if he is more awake to take it.  Continue supportive care.     LOS: 3 days   Weston Settle, MD 06/24/2016  8:35 AM

## 2016-06-25 DIAGNOSIS — S062X9A Diffuse traumatic brain injury with loss of consciousness of unspecified duration, initial encounter: Principal | ICD-10-CM

## 2016-06-25 DIAGNOSIS — I609 Nontraumatic subarachnoid hemorrhage, unspecified: Secondary | ICD-10-CM

## 2016-06-25 DIAGNOSIS — G9340 Encephalopathy, unspecified: Secondary | ICD-10-CM

## 2016-06-25 LAB — CBC
HEMATOCRIT: 40.4 % (ref 39.0–52.0)
Hemoglobin: 14.2 g/dL (ref 13.0–17.0)
MCH: 32.3 pg (ref 26.0–34.0)
MCHC: 35.1 g/dL (ref 30.0–36.0)
MCV: 92 fL (ref 78.0–100.0)
Platelets: 176 10*3/uL (ref 150–400)
RBC: 4.39 MIL/uL (ref 4.22–5.81)
RDW: 13.6 % (ref 11.5–15.5)
WBC: 13.8 10*3/uL — ABNORMAL HIGH (ref 4.0–10.5)

## 2016-06-25 LAB — BASIC METABOLIC PANEL
ANION GAP: 9 (ref 5–15)
BUN: 22 mg/dL — AB (ref 6–20)
CO2: 22 mmol/L (ref 22–32)
Calcium: 7.7 mg/dL — ABNORMAL LOW (ref 8.9–10.3)
Chloride: 112 mmol/L — ABNORMAL HIGH (ref 101–111)
Creatinine, Ser: 0.79 mg/dL (ref 0.61–1.24)
GFR calc Af Amer: >60 mL/min (ref >60)
GLUCOSE: 107 mg/dL — AB (ref 65–99)
POTASSIUM: 3.1 mmol/L — AB (ref 3.5–5.1)
Sodium: 143 mmol/L (ref 135–145)

## 2016-06-25 LAB — MAGNESIUM
Magnesium: 2.2 mg/dL (ref 1.7–2.4)
Magnesium: 2.1 mg/dL (ref 1.7–2.4)

## 2016-06-25 LAB — GLUCOSE, CAPILLARY
GLUCOSE-CAPILLARY: 129 mg/dL — AB (ref 65–99)
GLUCOSE-CAPILLARY: 138 mg/dL — AB (ref 65–99)
GLUCOSE-CAPILLARY: 144 mg/dL — AB (ref 65–99)

## 2016-06-25 LAB — PHOSPHORUS
Phosphorus: 1.4 mg/dL — ABNORMAL LOW (ref 2.5–4.6)
Phosphorus: 1.5 mg/dL — ABNORMAL LOW (ref 2.5–4.6)

## 2016-06-25 MED ORDER — HYDRALAZINE HCL 20 MG/ML IJ SOLN
10.0000 mg | Freq: Four times a day (QID) | INTRAMUSCULAR | Status: DC | PRN
  Administered 2016-06-25 – 2016-06-28 (×5): 10 mg via INTRAVENOUS
  Filled 2016-06-25 (×6): qty 1

## 2016-06-25 MED ORDER — POTASSIUM CHLORIDE 20 MEQ/15ML (10%) PO SOLN
20.0000 meq | Freq: Three times a day (TID) | ORAL | Status: DC
  Administered 2016-06-25: 20 meq
  Filled 2016-06-25: qty 15

## 2016-06-25 MED ORDER — POTASSIUM PHOSPHATES 15 MMOLE/5ML IV SOLN
45.0000 meq | Freq: Once | INTRAVENOUS | Status: AC
  Administered 2016-06-25: 45 meq via INTRAVENOUS
  Filled 2016-06-25: qty 10.23

## 2016-06-25 MED ORDER — PIVOT 1.5 CAL PO LIQD
1000.0000 mL | ORAL | Status: DC
  Administered 2016-06-25 – 2016-07-01 (×8): 1000 mL
  Filled 2016-06-25 (×10): qty 1000

## 2016-06-25 MED ORDER — DEXTROSE 5 % IV SOLN
2.0000 g | INTRAVENOUS | Status: DC
  Administered 2016-06-25 – 2016-07-02 (×8): 2 g via INTRAVENOUS
  Filled 2016-06-25 (×8): qty 2

## 2016-06-25 NOTE — Progress Notes (Addendum)
5/14 2311- svt for 10 seconds, returning to nsr/st. Provider Rolan Bucco( ramiraz MD ) notified, reviewed current electrolytes and given plan on current repletion of K and Phos. to check electrolytes in the AM. No new orders at this time.

## 2016-06-25 NOTE — Progress Notes (Signed)
Subjective: Patient reports still somnolent  Objective: Vital signs in last 24 hours: Temp:  [97.9 F (36.6 C)-101.3 F (38.5 C)] 99.5 F (37.5 C) (05/14 0400) Pulse Rate:  [52-89] 84 (05/14 0600) Resp:  [16-34] 21 (05/14 0600) BP: (117-167)/(63-106) 117/106 (05/14 0600) SpO2:  [88 %-98 %] 91 % (05/14 0600)  Intake/Output from previous day: 05/13 0701 - 05/14 0700 In: 2300 [I.V.:2300] Out: 1910 [Urine:1910] Intake/Output this shift: No intake/output data recorded.  Physical Exam: Wakes up and says "no."  Not stating name.  MAE to pain.  Lab Results:  Recent Labs  06/25/16 0315  WBC 13.8*  HGB 14.2  HCT 40.4  PLT 176   BMET  Recent Labs  06/25/16 0315  NA 143  K 3.1*  CL 112*  CO2 22  GLUCOSE 107*  BUN 22*  CREATININE 0.79  CALCIUM 7.7*    Studies/Results: Dg Chest Port 1 View  Result Date: 06/24/2016 CLINICAL DATA:  Decreased breath sounds, history of chest trauma, new rhonchi EXAM: PORTABLE CHEST 1 VIEW COMPARISON:  06/23/2016 FINDINGS: Left lung is clear. Patchy right upper lobe opacity, suspicious for pneumonia, possibly on the basis of aspiration. Additional hazy opacity overlying the right lower lung, likely reflecting a pleural effusion, possibly loculated. No pneumothorax is seen. The heart is normal in size. IMPRESSION: Patchy right upper lobe opacity, suspicious for pneumonia, possibly on the basis of aspiration. Suspected right pleural effusion. Electronically Signed   By: Charline BillsSriyesh  Krishnan M.D.   On: 06/24/2016 18:47    Assessment/Plan: DAI with aphasia.  No seizure activity.  Will need rehab.    LOS: 4 days    Dorian HeckleSTERN,Caelum Federici D, MD 06/25/2016, 8:33 AM

## 2016-06-25 NOTE — Progress Notes (Signed)
Neurology Progress Note  I am assuming neurologic care of this patient today. I reviewed his chart at length. History is obtained from review of the medical record and per discussion with patient's bedside nurse. No family was present at the bedside during my visit.  In brief, this is a 66 year old man who apparently fell from a ladder. He was found by family next to 10 foot ladder, minimally responsive. 911 was activated and noted an inpatient initial GCS score of 8 but this improved en route to the emergency department. In the ED, he was found have a traumatic subarachnoid hemorrhage in addition to fractures of the right clavicle, right ribs, and right pelvis. He was seen by trauma surgery and neurosurgery who recommended observation of his subarachnoid hemorrhage. Neurology was consulted for further recommendations. He was seen by Dr. Fredric Dine for the first time on 06/22/16. He documented that the patient's exam was somewhat limited by sedation but he was able to squeeze with both hands to command and held up 2 fingers to command. EEG was obtained and showed moderate generalized slowing without epileptiform abnormality or seizure. MRI scan of the brain was obtained and showed evidence consistent with diffuse axonal injury in addition to his subarachnoid bleeding. He is usually very encephalopathic with limited verbal output. Sometimes he seems to follow some commands but mostly time he does not. On occasion, he will state his name and answer with some automatic responses including yes, no, and okay. However, spontaneous verbal output has been very limited. He has not had any focal weakness. He was started on amantadine 100 mg twice daily of improving his mentation.  The patient is presently nonverbal and does not participate with review of systems.  Medications reviewed and reconciled.   Pertinent meds: Amantadine 100 mg twice daily Nicotine patch 21 mg daily  Current Meds:   Current  Facility-Administered Medications:  .  0.9 %  sodium chloride infusion, , Intravenous, Continuous, Abigail Miyamoto, MD, Last Rate: 100 mL/hr at 06/25/16 0600 .  acetaminophen (TYLENOL) suppository 650 mg, 650 mg, Rectal, Q4H PRN, Barnett Abu, MD, 650 mg at 06/24/16 2358 .  acetaminophen (TYLENOL) tablet 650 mg, 650 mg, Oral, Q4H PRN, Almond Lint, MD, 650 mg at 06/23/16 2142 .  amantadine (SYMMETREL) capsule 100 mg, 100 mg, Oral, BID, Weston Settle, MD, Stopped at 06/24/16 2338 .  chlorhexidine (PERIDEX) 0.12 % solution 15 mL, 15 mL, Mouth Rinse, BID, Abigail Miyamoto, MD, 15 mL at 06/24/16 2201 .  LORazepam (ATIVAN) injection 0.5-1 mg, 0.5-1 mg, Intravenous, Q4H PRN, Barnett Abu, MD, 1 mg at 06/25/16 0124 .  MEDLINE mouth rinse, 15 mL, Mouth Rinse, q12n4p, Abigail Miyamoto, MD, 15 mL at 06/24/16 1600 .  morphine 4 MG/ML injection 1 mg, 1 mg, Intravenous, Q1H PRN, Abigail Miyamoto, MD, 1 mg at 06/22/16 0515 .  morphine 4 MG/ML injection 2 mg, 2 mg, Intravenous, Q1H PRN, Abigail Miyamoto, MD, 2 mg at 06/23/16 1047 .  morphine 4 MG/ML injection 4 mg, 4 mg, Intravenous, Q1H PRN, Abigail Miyamoto, MD, 4 mg at 06/25/16 1610 .  nicotine (NICODERM CQ - dosed in mg/24 hours) patch 21 mg, 21 mg, Transdermal, Daily, Jimmye Norman, MD, 21 mg at 06/24/16 0954 .  ondansetron (ZOFRAN) tablet 4 mg, 4 mg, Oral, Q6H PRN **OR** ondansetron (ZOFRAN) injection 4 mg, 4 mg, Intravenous, Q6H PRN, Abigail Miyamoto, MD  Objective:  Temp:  [97.9 F (36.6 C)-101.3 F (38.5 C)] 98.4 F (36.9 C) (05/14 0839) Pulse Rate:  [52-89] 84 (05/14  0600) Resp:  [16-34] 21 (05/14 0600) BP: (117-167)/(63-106) 117/106 (05/14 0600) SpO2:  [88 %-98 %] 91 % (05/14 0600)  General: WDWN Caucasian man lying in bed. He is initially resting with his eyes closed. He briefly opened his eyes to voice but then close them again. He did not follow any commands for me right now. He was able to respond to a few limited questions with  yes and no and was able to state his first name for me. Otherwise, no verbal output. HEENT: Cervical collar in place. He resists passive eye opening. On limited visualization, sclerae appear anicteric and he appears to have mild conjunctival injection bilaterally. I was unable to visualize his oropharynx because he would not open his mouth for me.  CV: Regular, no murmur. Carotid pulses are 2+ and symmetric with no bruits. Distal pulses 2+ and symmetric.  Lungs: CTAB on anterior exam. Extremities: No C/C/E. Neuro: MS: As noted above.  CN: Pupils are difficult to assess as he resists passive eye opening. When eyes briefly able to see his eyes, these appear to be conjugate without forced deviation or nystagmus. Corneals are intact. He resists eye opening with good strength bilaterally. Face is symmetric at rest and he has symmetric grimace. Hearing appears to be intact to conversational voice. The remainder of his cranial nerve examination is limited by poor cooperation.  Motor: Normal bulk, tone. He is moving all 4 extremities spontaneously with at least 4-/5 strength. He does not participate with confrontational strength testing. No tremor or other abnormal movements are observed.  Sensation: He withdraws purposefully from noxious stimuli 4.  DTRs: 3 +, symmetric. He has sustained clonus at both ankles. Crossed abductors are present bilaterally. Toes are up oing bilaterally. Hoffmann's reflexes present bilaterally.  Coordination/gait: These cannot be assessed as the patient does not participate with the examination.   Labs: Lab Results  Component Value Date   WBC 13.8 (H) 06/25/2016   HGB 14.2 06/25/2016   HCT 40.4 06/25/2016   PLT 176 06/25/2016   GLUCOSE 107 (H) 06/25/2016   ALT 59 06/21/2016   AST 66 (H) 06/21/2016   NA 143 06/25/2016   K 3.1 (L) 06/25/2016   CL 112 (H) 06/25/2016   CREATININE 0.79 06/25/2016   BUN 22 (H) 06/25/2016   CO2 22 06/25/2016   INR 1.03 06/21/2016   CBC  Latest Ref Rng & Units 06/25/2016 06/22/2016 06/21/2016  WBC 4.0 - 10.5 K/uL 13.8(H) 16.5(H) -  Hemoglobin 13.0 - 17.0 g/dL 21.3 08.6 57.8  Hematocrit 39.0 - 52.0 % 40.4 44.1 48.0  Platelets 150 - 400 K/uL 176 201 -    No results found for: HGBA1C Lab Results  Component Value Date   ALT 59 06/21/2016   AST 66 (H) 06/21/2016   ALKPHOS 89 06/21/2016   BILITOT 1.1 06/21/2016   Serum ethanol less than 5 on admission  Radiology:  I have personally and independently reviewed MRI scan of the brain without contrast from 06/22/16. There is a mild amount of subarachnoid hemorrhage scattered over both cerebral hemispheres. There also appears to be some blood in the occipital horns of both lateral ventricles. There are numerous punctate areas of increased susceptibility involving both frontal lobes, left occipital lobe, the right thalamus, in the left midbrain. These are all suggestive of areas of punctate hemorrhage, concerning for diffuse axonal injury. Less likely would be areas of small contusion. These are all consistent with significant traumatic brain injury. There is no evidence of ischemic infarction.  Volumes appear to be normal for age. Visualized vascular flow-voids appear normal in configuration. Ventricles are normal in size and configuration, no hydrocephalus.  I have personally and independently reviewed CT scan of the head without contrast from 06/22/16. This shows a small amount of subarachnoid hemorrhage in the sylvian fissures bilaterally. Additional areas of subarachnoid blood are seen over the frontal and parietal lobes.  Other diagnostic studies:  EEG from 06/22/16 was interpreted as showing moderate generalized slowing without epileptiform discharges or seizures. The back and frequencies are noted to be 5-6 hertz.  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 This has resulted in diffuse axonal injury and subarachnoid  hemorrhage. At this point, continue with supportive care. Hypoxemia and hypotension should be avoided, even for brief periods, as these are associated with worse overall neurological outcomes. Management is directed at sequelae from his injury is no acute surgical intervention is necessary at this time.  2. Subarachnoid hemorrhage: This is acute, due to trauma. Treatment is supportive.Continue to monitor blood pressure, keeping systolic blood pressure less than 140-160 mmHg. Antiplatelet agents and nonsteroidal anti-inflammatories are best avoided for 7-10 days following an acute traumatic hemorrhage. Therapeutic systemic anticoagulation is best deferred for 3-4 weeks. Hemorrhage has been stable on repeat imaging.  3. 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 Treatment is supportive. Avoid antiplatelet agents, nonsteroidal anti-inflammatories, and therapeutic systemic anticoagulation as noted above. The presence of significant diffuse axonal injury is most likely the underlying explanation for his ongoing encephalopathy.  4. Acute encephalopathy: This is due to traumatic brain injury with the most significant contributor probably being the extent of diffuse axonal injury present in his frontal lobes. At this point, continue to optimize metabolic status as you are. Minimize CNS active medications, particularly opiates, benzodiazepines, and anything with strong anticholinergic properties. Agree with a trial of amantadine as this may be helpful in improving frontal lobe function in the setting of acute traumatic brain injury. Appreciate speech therapy assistance. Patient will likely benefit from cognitive rehabilitation once he is better able to participate.   No family was present at the time of my visit.  Neurology will continue to follow. Please call with any questions or concerns.  Rhona Leavensimothy Janus Vlcek, MD [facility]

## 2016-06-25 NOTE — Clinical Social Work Note (Signed)
SBIRT can not be completed due to pt's mental status.  WilsonBridget Tyri Elmore, [place]WA [phone]

## 2016-06-25 NOTE — Progress Notes (Signed)
[facility]/Trauma Progress Note      Subjective:  CC: unable to assess, somnolent   Pt was able to tell him his full name and date of birth. Would not answer any other questions. He did follow a few commands.   Objective: Vital signs in last 24 hours: Temp:  [97.9 F (36.6 C)-101.3 F (38.5 C)] 98.4 F (36.9 C) (05/14 0839) Pulse Rate:  [52-89] 84 (05/14 0600) Resp:  [16-34] 21 (05/14 0600) BP: (117-167)/(63-106) 117/106 (05/14 0600) SpO2:  [88 %-98 %] 91 % (05/14 0600) Last BM Date:  (5/12)  Intake/Output from previous day: 05/13 0701 - 05/14 0700 In: 2300 [I.V.:2300] Out: 1910 [Urine:1910] Intake/Output this shift: No intake/output data recorded.  PE: Gen:  Somnolent, followed 2 commands (open eyes, push feet on my hands), verbal, able to tell me his full name and birthday  Card:  RRR, no M/G/R heard, 2 + DP pulses bilaterally Pulm:  Effort normal, no accessory muscle use, course expiratory breath sounds, no rales or wheezes noted Abd: Soft, not distended, +BS, no HSM, no TTP noted Skin: no rashes noted, warm and dry Extremities: no deformities, edema or tenderness noted, 5/5 strength of plantar extension, moves all extremities Psych: oriented to person  Lab Results:   Recent Labs  06/25/16 0315  WBC 13.8*  HGB 14.2  HCT 40.4  PLT 176   BMET  Recent Labs  06/25/16 0315  NA 143  K 3.1*  CL 112*  CO2 22  GLUCOSE 107*  BUN 22*  CREATININE 0.79  CALCIUM 7.7*   PT/INR No results for input(s): LABPROT, INR in the last 72 hours. CMP     Component Value Date/Time   NA 143 06/25/2016 0315   K 3.1 (L) 06/25/2016 0315   CL 112 (H) 06/25/2016 0315   CO2 22 06/25/2016 0315   GLUCOSE 107 (H) 06/25/2016 0315   BUN 22 (H) 06/25/2016 0315   CREATININE 0.79 06/25/2016 0315   CALCIUM 7.7 (L) 06/25/2016 0315   PROT 7.0 06/21/2016 1659   ALBUMIN 4.1 06/21/2016 1659   AST 66 (H) 06/21/2016 1659   ALT 59 06/21/2016 1659   ALKPHOS 89 06/21/2016  1659   BILITOT 1.1 06/21/2016 1659   GFRNONAA >60 06/25/2016 0315   GFRAA >60 06/25/2016 0315   Lipase  No results found for: LIPASE  Studies/Results: Dg Chest Port 1 View  Result Date: 06/24/2016 CLINICAL DATA:  Decreased breath sounds, history of chest trauma, new rhonchi EXAM: PORTABLE CHEST 1 VIEW COMPARISON:  06/23/2016 FINDINGS: Left lung is clear. Patchy right upper lobe opacity, suspicious for pneumonia, possibly on the basis of aspiration. Additional hazy opacity overlying the right lower lung, likely reflecting a pleural effusion, possibly loculated. No pneumothorax is seen. The heart is normal in size. IMPRESSION: Patchy right upper lobe opacity, suspicious for pneumonia, possibly on the basis of aspiration. Suspected right pleural effusion. Electronically Signed   By: Charline Bills M.D.   On: 06/24/2016 18:47    Anti-infectives: Anti-infectives    None       Assessment/Plan Fall from ladder  TBI w/ SAH and intraventricular hemorrhage  - EEG showed moderate generalized slowing of brain activity and MRI showed traumatic parenchymal brain injury with multiple microhemorrhages - neurosurgery and neurology following, appreciate their recs - Amantadine per neurology - no indication for neurosurgical intervention Right clavicle fracture - WBAT BLE, Sling for RUE when out of bed, Dr. Eulah Pont recommends upright of clavicle if pt has neurological improvement right posterior  2-6 rib fractures with contusion and tiny PTX - conservative treatment, pulmonary toileting, chest xray, 5/13, concerning for PNA R lobe, started Rocephin for possible PNA R displaced comminuted sacrum fracture and R pubic rami fracture - ortho did not comment, will ask ortho to comment on the above  Fevers:  WBC trending down, febrile, not tachycardic  C collar in place - CT scan cervical showed no abnormalities, unable to clear in pt's current neurological state  FEN: Speech is recommending NPO  excepts meds, cortrak w/ tube feeds, dietary has been consulted VTE: SCD's, no anticoagulation with SAH ID: None  DISPO: Pt will likely need CIR for TIB, ortho recs pending on pelvic frxs then PT/OT, cortrak today with dietary consult, follow WBC and repeat chest xray tomorrow    LOS: 4 days    Jerre SimonJessica L Burris Matherne , [facility] 06/25/2016, 9:35 AM Pager: [phone] Consults: [phone] Mon-Fri 7:00 am-4:30 pm Sat-Sun 7:00 am-11:30 am

## 2016-06-25 NOTE — Evaluation (Signed)
Occupational Therapy Evaluation Patient Details Name: Tyler Lane MRN: [id number] DOB: 02/04/1951 Today's Date: 06/25/2016    History of Present Illness 66 y.o. male admitted after fall from 10 foot ladder, suffering TBI, SAH, right clavicle fracture, right superior rami fracture. MR Brain revealed TBI with multiple microhemorrhages of right frontal lobe, left occipital lobe and right thalamus. MD also reported "widespread axonal injury and moderate cortical dysfunction   Clinical Impression   Pt admitted with above. He demonstrates the below listed deficits and will benefit from continued OT to maximize safety and independence with BADLs.    Pt presents to OT with behaviors consistent with Ranchos Level IV/emerging level V.   He follows one step commands inconsistently, and when alert, able to answer biographical information accurately with cues for attention/arousal.   Unable to progress to EOB at this time as we are awaiting clarification from ortho re: precautions/limitations.  Family is very supportive.  Feel he will benefit from CIR.       Follow Up Recommendations  CIR;Supervision/Assistance - 24 hour    Equipment Recommendations  None recommended by OT    Recommendations for Other Services Rehab consult     Precautions / Restrictions Precautions Precautions: Fall;Cervical Precaution Comments: awaiting clarification from ortho re: WB status/precautions  Required Braces or Orthoses: Cervical Brace Restrictions Other Position/Activity Restrictions: awaiting clarification from ortho re: WB status/precautions       Mobility Bed Mobility               General bed mobility comments: total assist to reposition in bed for safety  Transfers                 General transfer comment: unable - awaiting clarification from ortho     Balance                                           ADL either performed or assessed with clinical judgement    ADL Overall ADL's : Needs assistance/impaired Eating/Feeding: NPO   Grooming: Wash/dry hands;Wash/dry face;Oral care;Brushing hair;Total assistance;Bed level   Upper Body Bathing: Total assistance;Bed level   Lower Body Bathing: Total assistance;Bed level   Upper Body Dressing : Total assistance;Bed level   Lower Body Dressing: Total assistance;Bed level   Toilet Transfer: Total assistance Toilet Transfer Details (indicate cue type and reason): unable  Toileting- Clothing Manipulation and Hygiene: Total assistance;Bed level         General ADL Comments: Pt unable to sustain attention to complete ADLs      Vision   Additional Comments: Pt opens eyes only briefly      Perception Perception Comments: unable to fully assess    Praxis Praxis Praxis-Other Comments: unable to fully assess     Pertinent Vitals/Pain Pain Assessment: Faces Faces Pain Scale: Hurts little more Pain Location: Possibly clavicle  Pain Descriptors / Indicators: Discomfort;Grimacing;Moaning;Restless Pain Intervention(s): Repositioned;Monitored during session     Hand Dominance Right   Extremity/Trunk Assessment Upper Extremity Assessment Upper Extremity Assessment: RUE deficits/detail;LUE deficits/detail RUE Deficits / Details: Pt spontaneously moving bil. UEs.  Pt with Rt clavicle fracture so Rt shoulder not formally assessed as awaiting activity/ROM clarification from ortho    Lower Extremity Assessment Lower Extremity Assessment: Defer to PT evaluation   Cervical / Trunk Assessment Cervical / Trunk Assessment:  (TBD )   Communication Communication Communication: No difficulties  Cognition Arousal/Alertness: Lethargic Behavior During Therapy: Restless;Impulsive Overall Cognitive Status: Impaired/Different from baseline Area of Impairment: Orientation;Attention;Following commands;JFK Recovery Scale Auditory: Auditory Startle Visual: None Motor: Automatic Motor  Response Oromotor/Verbal: Vocalization/Oral Movement Communication: Non-functional: intentional Arousal: Eye opening with stimulation Total Score: 10 Rancho Levels of Cognitive Functioning Rancho Los Amigos Scales of Cognitive Functioning: Confused/agitated (emerging V) Orientation Level: Disoriented to;Place;Time;Situation Current Attention Level: Focused   Following Commands: Follows one step commands inconsistently           General Comments  VSS.  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, he worked as a Customer service manager Problem List: Decreased strength;Decreased range of motion;Decreased activity tolerance;Impaired balance (sitting and/or standing);Impaired vision/perception;Decreased cognition;Decreased safety awareness;Decreased knowledge of use of DME or AE;Decreased knowledge of precautions;Impaired UE functional use;Pain      OT Treatment/Interventions: Self-care/ADL training;Therapeutic exercise;Energy conservation;DME and/or AE instruction;Therapeutic activities;Cognitive remediation/compensation;Visual/perceptual remediation/compensation;Patient/family education;Balance training;Neuromuscular education    OT Goals(Current goals can be found in the care plan section) Acute Rehab OT Goals Patient Stated Goal: none stated OT Goal Formulation: With family Time For Goal Achievement: 07/09/16 Potential to Achieve Goals: Good ADL Goals Pt Will Perform Grooming: with min assist;sitting Pt Will  Perform Upper Body Bathing: with min assist;sitting Additional ADL Goal #1: Pt will sustain attention to familiar ADL task x 5 mins with min cues  Additional ADL Goal #2: Pt will follow one step commands consistently during ADLs   OT Frequency: Min 2X/week   Barriers to D/C:            Co-evaluation PT/OT/SLP Co-Evaluation/Treatment: Yes Reason for Co-Treatment: Complexity of the patient's impairments (multi-system involvement);Necessary to address cognition/behavior during functional activity;For patient/therapist safety PT goals addressed during session: Other (comment) (TBI therapies) OT goals addressed during session: ADL's and self-care      AM-PAC PT "6 Clicks" Daily Activity     Outcome Measure Help from another person eating meals?: Total Help from another person taking care of personal grooming?: Total Help from another person toileting, which includes using toliet, bedpan, or urinal?: Total Help from another person bathing (including washing, rinsing, drying)?: Total Help from another person to put on and taking off regular upper body clothing?: Total Help from another person to put on and taking off regular lower body clothing?: Total 6 Click Score: 6   End of Session Equipment Utilized During Treatment: Oxygen;Cervical collar Nurse Communication: Other (comment) (eval performance )  Activity Tolerance: Patient tolerated treatment well Patient left: in bed;with family/visitor present;with call bell/phone within reach  OT Visit Diagnosis: Cognitive communication deficit (R41.841);Pain Pain - Right/Left: Right Pain - part of body: Shoulder                Time: 1536-1600 OT Time Calculation (min): 24 min Charges:  OT General Charges $OT Visit: 1 Procedure OT Evaluation $OT Eval Moderate Complexity: 1  Procedure G-Codes:     Jeani HawkingWendi Rashonda Warrior, OTR/L [phone]   Jeani HawkingConarpe, Jakell Trusty M 06/25/2016, 5:54 PM

## 2016-06-25 NOTE — Progress Notes (Signed)
Cortrak Tube Team Note:  Consult received to place a Cortrak feeding tube.   A 10 F Cortrak tube was placed in the right nare and secured with a nasal bridle at 91 cm. Per the Cortrak monitor reading the tube tip is post-pyloric.   No x-ray is required. RN may begin using tube. Discussed with RN.   If the tube becomes dislodged please keep the tube and contact the Cortrak team at [email or web address] (password [email or web address]) for replacement.  If after hours and replacement cannot be delayed, place a NG tube and confirm placement with an abdominal x-ray.   Romelle Starcherate Raynelle Fujikawa MS, RD, LDN [phone] Pager  [phone] Weekend/On-Call Pager

## 2016-06-25 NOTE — Progress Notes (Signed)
  Speech Language Pathology Treatment: Cognitive-Linquistic  Patient Details Name: Tyler AskewDavid Lane MRN: [id number] DOB: 1950-04-19 Today's Date: 06/25/2016 Time: 1524-1600 SLP Time Calculation (min) (ACUTE ONLY): 36 min  Assessment / Plan / Recommendation Clinical Impression  Treatment focused on cognitive recovery. Patient presenting with behaviors consistent with a Rancho Level V (confused agitated)/V (confused, inappropriate) with abilities being impacted significantly by lethargy, requiring max tactile and verbal cueing through out treatment for arousal. Patient able to provided verbally appropriate answers to orientation questions x 2 (person, DOB), demonstrates intact intellectual awareness of daughters presence. Patient with increasing restlessness, moaning as session progressed/with increased stimulation. Education provided to daughter Herbert Lane(Tyler) regarding role of therapy and current Rancho Level, plan of care. Will continue to f/u. No pos provided today given level of alertness. Agree with placement of NG Tube, recommend NPO including meds.     HPI HPI: Patient is a 66 y.o. male admitted after fall from 10 foot ladder, suffering TBI, SAH, right clavicle fracture, right superior rami fracture. MR Brain revealed TBI with multiple microhemorrhages of right frontal lobe, left occipital lobe and right thalamus. MD also reported "widespread axonal injury and moderate cortical dysfunction."      SLP Plan  Continue with current plan of care       Recommendations                   Oral Care Recommendations: Oral care BID Follow up Recommendations: 24 hour supervision/assistance;Inpatient Rehab Plan: Continue with current plan of care       [facility] Stevon Gough MA, CCC-SLP [phone]              Suleima Ohlendorf MA, CCC-SLP [phone]    Ferdinand LangoMcCoy Tahra Hitzeman Meryl 06/25/2016, 4:32 PM

## 2016-06-25 NOTE — Progress Notes (Signed)
Initial Nutrition Assessment  INTERVENTION:   Pivot 1.5 @ 60 ml/hr (1440 ml/day) via Cortrak.  Provides: 2160 kcal, 135 grams protein, and 1092 ml free water.   NUTRITION DIAGNOSIS:   Increased nutrient needs related to  (TBI) as evidenced by estimated needs.  GOAL:   Patient will meet greater than or equal to 90% of their needs  MONITOR:   Diet advancement, I & O's, TF tolerance  REASON FOR ASSESSMENT:   Consult Enteral/tube feeding initiation and management  ASSESSMENT:   Pt with hx of HTN and HOH admitted after fall from a ladder with TBI, SAH, intraventricular hemorrhage, R clavicle fx, R posterior 2-6 fx, R displaced comminuted sacrum fx and R pubic rami fx.     5/14 Cortrak placed after failed swallow eval.  Per neuro pt with moderate to severe TBI.  Daughter at bedside and reports no recent weight changes. He eats unhealthy per daughter but no changes to his diet recently. He does not drink ETOH but does smoke. Nutrition-Focused physical exam completed. Findings are no fat depletion, no muscle depletion, and no edema.     Diet Order:  Diet NPO time specified  Skin:  Reviewed, no issues  Last BM:  5/12  Height:   Ht Readings from Last 1 Encounters:  06/21/16 5\' 11"  (1.803 m)    Weight:   Wt Readings from Last 1 Encounters:  06/21/16 178 lb (80.7 kg)    Ideal Body Weight:  78.1 kg  BMI:  Body mass index is 24.83 kg/m.  Estimated Nutritional Needs:   Kcal:  2000-2200  Protein:  120-140 grams  Fluid:  > 2 L/day  EDUCATION NEEDS:   No education needs identified at this time  Kendell BaneHeather Ryin Schillo RD, LDN, CNSC [phone] Pager [phone] After Hours Pager

## 2016-06-25 NOTE — Evaluation (Signed)
Physical Therapy Evaluation/ TBI Therapy Team Patient Details Name: Tyler AskewDavid Lane MRN: [id number] DOB: [birth year] Today's Date: 06/25/2016   History of Present Illness  66 y.o. male admitted after fall from 10 foot ladder, suffering TBI, SAH, right clavicle fracture, right superior rami fracture. MR Brain revealed TBI with multiple microhemorrhages of right frontal lobe, left occipital lobe and right thalamus. MD also reported "widespread axonal injury and moderate cortical dysfunction  Clinical Impression  Orders received for TBI team evaluation. Patient seen in conjunction with OT and SLP. Patient demonstrates deficits in cognition, awareness, and overall function. Session limited at this time while awaiting clearance re: orthopedic injury management. Will benefit from continued skilled PT to address deficits and maximize function. Patient is showing active use of extremities and intermittent ability to respond to simple questions. Anticipate patient will need post acute rehabilitation, at this time will tentatively recommend CIR pending patients progress. Daughter present during session, educated re:TBI. Patient currently showing signs consistent with Rancho level IV (confused/restless/agitated) with some emerging Rancho V. Will see as indicated and progress as tolerated.    Follow Up Recommendations CIR (pending progress)    Equipment Recommendations   (TBD)    Recommendations for Other Services       Precautions / Restrictions Precautions Precautions: Fall Precaution Comments: awaiting clarification from ortho re: WB status/precautions  Restrictions Other Position/Activity Restrictions: awaiting clarification from ortho re: WB status/precautions       Mobility  Bed Mobility               General bed mobility comments: total assist to reposition in bed for safety  Transfers                    Ambulation/Gait                Stairs             Wheelchair Mobility    Modified Rankin (Stroke Patients Only) Modified Rankin (Stroke Patients Only) Pre-Morbid Rankin Score: No symptoms Modified Rankin: Severe disability     Balance                                             Pertinent Vitals/Pain Pain Assessment: (P) Faces Faces Pain Scale: No hurt    Home Living Family/patient expects to be discharged to:: (P) Unsure Living Arrangements: (P) Spouse/significant other (Simultaneous filing. User may not have seen previous data.) Available Help at Discharge: (P) Family (Simultaneous filing. 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  Prior to that, he worked as a Teacher, English as a foreign languageprivate investigator      Hand Dominance   Dominant Hand: (P) Right    Extremity/Trunk Assessment   Upper Extremity Assessment Upper Extremity Assessment: Defer to OT evaluation    Lower Extremity Assessment Lower Extremity Assessment:  (AROM noted Bilateral LEs, appears within functional range)       Communication   Communication: (P) No difficulties  Cognition     Overall Cognitive Status: Impaired/Different from baseline Area of Impairment: Orientation;JFK Recovery Scale;Rancho level Auditory: Auditory Startle Visual: None Motor: Automatic Motor  Response Oromotor/Verbal: Vocalization/Oral Movement Communication: Non-functional: intentional Arousal: Eye opening with stimulation Total Score: 10 Rancho Levels of Cognitive Functioning Rancho Los Amigos Scales of Cognitive Functioning: Confused/agitated (emerging V) Orientation Level: Disoriented to;Place;Time;Situation                    General Comments      Exercises     Assessment/Plan    PT Assessment Patient needs continued PT services  PT Problem List  Decreased strength;Decreased activity tolerance;Decreased balance;Decreased mobility;Decreased coordination;Decreased cognition;Decreased safety awareness;Pain       PT Treatment Interventions DME instruction;Gait training;Functional mobility training;Therapeutic activities;Therapeutic exercise;Balance training;Patient/family education    PT Goals (Current goals can be found in the Care Plan section)  Acute Rehab PT Goals Patient Stated Goal: none stated PT Goal Formulation: Patient unable to participate in goal setting Time For Goal Achievement: 07/09/16 Potential to Achieve Goals: Good    Frequency Min 3X/week   Barriers to discharge        Co-evaluation PT/OT/SLP Co-Evaluation/Treatment: Yes Reason for Co-Treatment: Complexity of the patient's impairments (multi-system involvement);Necessary to address cognition/behavior during functional activity;For patient/therapist safety PT goals addressed during session: Other (comment) (TBI therapies) OT goals addressed during session: ADL's and self-care       AM-PAC PT "6 Clicks" Daily Activity  Outcome Measure Difficulty turning over in bed (including adjusting bedclothes, sheets and blankets)?: Total Difficulty moving from lying on back to sitting on the side of the bed? : Total Difficulty sitting down on and standing up from a chair with arms (e.g., wheelchair, bedside commode, etc,.)?: Total Help needed moving to and from a bed to chair (including a wheelchair)?: Total Help needed walking in hospital room?: Total Help needed climbing 3-5 steps with a railing? : Total 6 Click Score: 6    End of Session Equipment Utilized During Treatment: Cervical collar;Oxygen Activity Tolerance: Patient limited by fatigue;Patient limited by lethargy;Other (comment) (awaiting clearance from orthopedics re: injuries) Patient left: in bed;with call bell/phone within reach;with bed alarm set;with family/visitor present;with SCD's reapplied;Other  (comment) (mitts in place)   PT Visit Diagnosis: Other symptoms and signs involving the nervous system (R29.898)    Time: 1524-1600 PT Time Calculation (min) (ACUTE ONLY): 36 min   Charges:   PT Evaluation $PT Eval High Complexity: 1 Procedure     PT G Codes:        [name], PT DPT  [phone]   [name] 06/25/2016, 5:42 PM

## 2016-06-26 ENCOUNTER — Inpatient Hospital Stay (HOSPITAL_COMMUNITY): Payer: Medicare HMO

## 2016-06-26 DIAGNOSIS — S42001A Fracture of unspecified part of right clavicle, initial encounter for closed fracture: Secondary | ICD-10-CM

## 2016-06-26 DIAGNOSIS — S069X9A Unspecified intracranial injury with loss of consciousness of unspecified duration, initial encounter: Secondary | ICD-10-CM

## 2016-06-26 LAB — CBC
HCT: 40.9 % (ref 39.0–52.0)
Hemoglobin: 14.2 g/dL (ref 13.0–17.0)
MCH: 31.4 pg (ref 26.0–34.0)
MCHC: 34.7 g/dL (ref 30.0–36.0)
MCV: 90.5 fL (ref 78.0–100.0)
Platelets: 180 10*3/uL (ref 150–400)
RBC: 4.52 MIL/uL (ref 4.22–5.81)
RDW: 13.5 % (ref 11.5–15.5)
WBC: 14.2 10*3/uL — AB (ref 4.0–10.5)

## 2016-06-26 LAB — GLUCOSE, CAPILLARY
GLUCOSE-CAPILLARY: 135 mg/dL — AB (ref 65–99)
GLUCOSE-CAPILLARY: 141 mg/dL — AB (ref 65–99)
GLUCOSE-CAPILLARY: 148 mg/dL — AB (ref 65–99)
GLUCOSE-CAPILLARY: 154 mg/dL — AB (ref 65–99)
Glucose-Capillary: 136 mg/dL — ABNORMAL HIGH (ref 65–99)
Glucose-Capillary: 121 mg/dL — ABNORMAL HIGH (ref 65–99)

## 2016-06-26 LAB — BASIC METABOLIC PANEL
Anion gap: 6 (ref 5–15)
BUN: 25 mg/dL — AB (ref 6–20)
CHLORIDE: 115 mmol/L — AB (ref 101–111)
CO2: 23 mmol/L (ref 22–32)
CREATININE: 0.68 mg/dL (ref 0.61–1.24)
Calcium: 7.9 mg/dL — ABNORMAL LOW (ref 8.9–10.3)
GFR calc Af Amer: >60 mL/min (ref >60)
Glucose, Bld: 134 mg/dL — ABNORMAL HIGH (ref 65–99)
Potassium: 3.1 mmol/L — ABNORMAL LOW (ref 3.5–5.1)
SODIUM: 144 mmol/L (ref 135–145)

## 2016-06-26 LAB — MAGNESIUM
MAGNESIUM: 2.3 mg/dL (ref 1.7–2.4)
MAGNESIUM: 2.3 mg/dL (ref 1.7–2.4)

## 2016-06-26 LAB — PHOSPHORUS
Phosphorus: 2.1 mg/dL — ABNORMAL LOW (ref 2.5–4.6)
Phosphorus: 2.3 mg/dL — ABNORMAL LOW (ref 2.5–4.6)

## 2016-06-26 MED ORDER — HYDROCODONE-ACETAMINOPHEN 7.5-325 MG/15ML PO SOLN
15.0000 mL | ORAL | Status: DC | PRN
  Administered 2016-06-26 – 2016-07-01 (×14): 15 mL
  Filled 2016-06-26 (×15): qty 15

## 2016-06-26 MED ORDER — POTASSIUM PHOSPHATES 15 MMOLE/5ML IV SOLN
45.0000 mmol | Freq: Once | INTRAVENOUS | Status: AC
  Administered 2016-06-26: 45 mmol via INTRAVENOUS
  Filled 2016-06-26: qty 15

## 2016-06-26 MED ORDER — ACETAMINOPHEN 160 MG/5ML PO SOLN
650.0000 mg | Freq: Four times a day (QID) | ORAL | Status: DC | PRN
  Administered 2016-06-28 – 2016-06-29 (×2): 650 mg
  Filled 2016-06-26 (×2): qty 20.3

## 2016-06-26 MED ORDER — BETHANECHOL CHLORIDE 25 MG PO TABS
25.0000 mg | ORAL_TABLET | Freq: Three times a day (TID) | ORAL | Status: DC
  Administered 2016-06-26 – 2016-07-02 (×19): 25 mg
  Filled 2016-06-26 (×5): qty 3
  Filled 2016-06-26: qty 1
  Filled 2016-06-26: qty 3
  Filled 2016-06-26: qty 1
  Filled 2016-06-26 (×2): qty 3
  Filled 2016-06-26: qty 1
  Filled 2016-06-26: qty 3
  Filled 2016-06-26 (×3): qty 1
  Filled 2016-06-26 (×2): qty 3
  Filled 2016-06-26 (×2): qty 1

## 2016-06-26 NOTE — Progress Notes (Signed)
Occupational Therapy Treatment Patient Details Name: Tyler AskewDavid Lane MRN: [id number] DOB: 10-19-1950 Today's Date: 06/26/2016    History of present illness 66 y.o. male admitted after fall from 10 foot ladder, suffering TBI, SAH, right clavicle fracture, right superior rami fracture. MR Brain revealed TBI with multiple microhemorrhages of right frontal lobe, left occipital lobe and right thalamus. MD also reported "widespread axonal injury and moderate cortical dysfunction   OT comments  Pt seen with PT.  He moved to EOB with total A +2.  He was briefly able to support self with min guard assist, but required max A remainder of the time.  Max A +2 for standing.  He indicates significant pain with mobility.   He kept eyes closed throughout session.  Followed no commands, but answers occasionally simple yes/no questions.    Follow Up Recommendations  CIR;Supervision/Assistance - 24 hour    Equipment Recommendations  None recommended by OT    Recommendations for Other Services Rehab consult    Precautions / Restrictions Precautions Precautions: Fall;Cervical Precaution Comments: no clarification provided re: limitations/precautions Rt shoulder  Required Braces or Orthoses: Cervical Brace       Mobility Bed Mobility Overal bed mobility: Needs Assistance Bed Mobility: Supine to Sit;Sit to Supine     Supine to sit: Total assist;+2 for physical assistance Sit to supine: Max assist;+2 for physical assistance   General bed mobility comments: Pt does not assist with moving to EOB.  He assisted minimally with lowering trunk to bed.  Required assist to move LEs onto bed   Transfers                 General transfer comment: unable     Balance Overall balance assessment: Needs assistance Sitting-balance support: Feet supported;Single extremity supported Sitting balance-Leahy Scale: Poor Sitting balance - Comments: Pt required max A to maintain EOB sitting x ~ 10 mins, however,  as pt was assisted toward supine, he sat back up unsupported with min guard assist for ~ 3o seconds  Postural control: Posterior lean Standing balance support: Bilateral upper extremity supported Standing balance-Leahy Scale: Poor Standing balance comment: Pt requires max A +2 for standing                            ADL either performed or assessed with clinical judgement   ADL                                         General ADL Comments: Pt unable to engage in ADLs today      Vision       Perception     Praxis      Cognition Arousal/Alertness: Lethargic Behavior During Therapy: Restless Overall Cognitive Status: Impaired/Different from baseline Area of Impairment: Attention               Rancho Levels of Cognitive Functioning Rancho MirantLos Amigos Scales of Cognitive Functioning: Confused/agitated   Current Attention Level: Focused           General Comments: Pt did not follow commands, but does answer occasional simple yes/no questions         Exercises     Shoulder Instructions       General Comments Pt incontinent of stool.  Assisted with peri care in supine.   Also assisted RN with transfer of pt from  regular bed to ICU air mattress     Pertinent Vitals/ Pain       Pain Assessment: Faces Faces Pain Scale: Hurts whole lot Pain Location: ribs, pelvis, clavicle  Pain Descriptors / Indicators: Grimacing;Guarding;Restless Pain Intervention(s): Repositioned  Home Living                                          Prior Functioning/Environment              Frequency  Min 2X/week        Progress Toward Goals  OT Goals(current goals can now be found in the care plan section)  Progress towards OT goals: Progressing toward goals     Plan Discharge plan remains appropriate    Co-evaluation    PT/OT/SLP Co-Evaluation/Treatment: Yes Reason for Co-Treatment: Complexity of the patient's impairments  (multi-system involvement);Necessary to address cognition/behavior during functional activity;For patient/therapist safety   OT goals addressed during session: ADL's and self-care      AM-PAC PT "6 Clicks" Daily Activity     Outcome Measure   Help from another person eating meals?: Total Help from another person taking care of personal grooming?: Total Help from another person toileting, which includes using toliet, bedpan, or urinal?: Total Help from another person bathing (including washing, rinsing, drying)?: Total Help from another person to put on and taking off regular upper body clothing?: Total Help from another person to put on and taking off regular lower body clothing?: Total 6 Click Score: 6    End of Session Equipment Utilized During Treatment: Oxygen;Cervical collar  OT Visit Diagnosis: Pain;Cognitive communication deficit (R41.841) Pain - Right/Left: Right Pain - part of body: Shoulder   Activity Tolerance Patient tolerated treatment well   Patient Left in bed;with family/visitor present;with call bell/phone within reach   Nurse Communication Mobility status        Time: 1610-9604 OT Time Calculation (min): 29 min  Charges: OT General Charges $OT Visit: 1 Procedure OT Treatments $Therapeutic Activity: 8-22 mins  [employer], OTR/L [phone]    Jeani Hawking M 06/26/2016, 3:07 PM

## 2016-06-26 NOTE — Progress Notes (Signed)
Neurology Progress Note  Subjective: No major overnight events reported. He remains encephalopathic, no major change in neurologic status. His daughter is present at the bedside. She states that he will answer yes and no to some questions but still does not have much more verbal output than that. He is restless, continuously moving both arms and legs in a semi-purposeful manner.   The patient is presently nonverbal and does not participate with review of systems.  Medications reviewed and reconciled.   Pertinent meds: Amantadine 100 mg twice daily Nicotine patch 21 mg daily Bethanechol 25 mg TID  Current Meds:   Current Facility-Administered Medications:  .  0.9 %  sodium chloride infusion, , Intravenous, Continuous, Abigail Miyamoto, MD, Last Rate: 100 mL/hr at 06/26/16 1400 .  acetaminophen (TYLENOL) solution 650 mg, 650 mg, Per Tube, Q6H PRN, Violeta Gelinas, MD .  acetaminophen (TYLENOL) suppository 650 mg, 650 mg, Rectal, Q4H PRN, Barnett Abu, MD, 650 mg at 06/24/16 2358 .  amantadine (SYMMETREL) capsule 100 mg, 100 mg, Oral, BID, Weston Settle, MD, 100 mg at 06/26/16 0949 .  bethanechol (URECHOLINE) tablet 25 mg, 25 mg, Per Tube, TID, Violeta Gelinas, MD, 25 mg at 06/26/16 0950 .  cefTRIAXone (ROCEPHIN) 2 g in dextrose 5 % 50 mL IVPB, 2 g, Intravenous, Q24H, Focht, Jessica L, PA, Stopped at 06/26/16 1304 .  chlorhexidine (PERIDEX) 0.12 % solution 15 mL, 15 mL, Mouth Rinse, BID, Abigail Miyamoto, MD, 15 mL at 06/26/16 1001 .  feeding supplement (PIVOT 1.5 CAL) liquid 1,000 mL, 1,000 mL, Per Tube, Q24H, Jimmye Norman, MD, Last Rate: 60 mL/hr at 06/26/16 1400, 1,000 mL at 06/26/16 1400 .  hydrALAZINE (APRESOLINE) injection 10 mg, 10 mg, Intravenous, Q6H PRN, Axel Filler, MD, 10 mg at 06/26/16 1234 .  HYDROcodone-acetaminophen (HYCET) 7.5-325 mg/15 ml solution 15 mL, 15 mL, Per Tube, Q4H PRN, Violeta Gelinas, MD, 15 mL at 06/26/16 0843 .  LORazepam (ATIVAN) injection 0.5-1 mg,  0.5-1 mg, Intravenous, Q4H PRN, Barnett Abu, MD, 1 mg at 06/26/16 0958 .  MEDLINE mouth rinse, 15 mL, Mouth Rinse, q12n4p, Abigail Miyamoto, MD, 15 mL at 06/26/16 1229 .  morphine 4 MG/ML injection 1 mg, 1 mg, Intravenous, Q1H PRN, Abigail Miyamoto, MD, 1 mg at 06/22/16 0515 .  morphine 4 MG/ML injection 2 mg, 2 mg, Intravenous, Q1H PRN, Abigail Miyamoto, MD, 2 mg at 06/25/16 2045 .  morphine 4 MG/ML injection 4 mg, 4 mg, Intravenous, Q1H PRN, Abigail Miyamoto, MD, 4 mg at 06/26/16 0353 .  nicotine (NICODERM CQ - dosed in mg/24 hours) patch 21 mg, 21 mg, Transdermal, Daily, Jimmye Norman, MD, 21 mg at 06/26/16 0956 .  ondansetron (ZOFRAN) tablet 4 mg, 4 mg, Oral, Q6H PRN **OR** ondansetron (ZOFRAN) injection 4 mg, 4 mg, Intravenous, Q6H PRN, Abigail Miyamoto, MD  Objective:  Temp:  [98.5 F (36.9 C)-100.5 F (38.1 C)] 99 F (37.2 C) (05/15 0800) Pulse Rate:  [71-104] 103 (05/15 1400) Resp:  [21-36] 34 (05/15 1400) BP: (136-172)/(79-111) 153/88 (05/15 1400) SpO2:  [91 %-98 %] 97 % (05/15 1400) Weight:  [81.1 kg (178 lb 12.7 oz)] 81.1 kg (178 lb 12.7 oz) (05/14 1700)  General: WDWN Caucasian man lying in bed. He tends to keep his eyes closed. He did briefly opened his eyes several times during the encounter but really didn't fix or track. He would answer some questions with yes and no responses. However, there is a paucity of spontaneous verbal output. He did not follow commands for me. He appears  restless, constantly moving his arms and legs. He appears to move the left side better than the right. HEENT: Cervical collar in place. He resists passive eye opening. On limited visualization, sclerae appear anicteric and he has mild conjunctival injection bilaterally. The oropharynx cannot be visualized due to poor patient cooperation.  CV: Regular, no murmur. Carotid pulses are 2+ and symmetric with no bruits. Distal pulses 2+ and symmetric.  Lungs: CTAB on anterior exam. Extremities: No  C/C/E. Neuro: MS: As noted above.  CN: Pupils are difficult to assess as he resists passive eye opening. When he does open his eyes, they are mildly disconjugate. No forced deviation or nystagmus. Corneals are intact and symmetric. He resists eye opening with good strength bilaterally. Face is symmetric at rest and he has symmetric grimace. The remainder of his cranial nerve examination is limited by poor cooperation.  Motor: Normal bulk, tone. He is moving all 4 extremities spontaneously with at least 4-/5 strength. He appears to move the left side better than the right. He does not participate with confrontational strength testing. No tremor or other abnormal movements are observed.  Sensation: He withdraws purposefully from noxious stimuli 4.  DTRs: 3 +, symmetric. He has sustained clonus at both ankles. Crossed adductors are present bilaterally. Toes are upgoing bilaterally. Hoffmann's reflexes present bilaterally.  Coordination/gait: These cannot be assessed as the patient does not participate with the examination.   Labs: Lab Results  Component Value Date   WBC 14.2 (H) 06/26/2016   HGB 14.2 06/26/2016   HCT 40.9 06/26/2016   PLT 180 06/26/2016   GLUCOSE 134 (H) 06/26/2016   ALT 59 06/21/2016   AST 66 (H) 06/21/2016   NA 144 06/26/2016   K 3.1 (L) 06/26/2016   CL 115 (H) 06/26/2016   CREATININE 0.68 06/26/2016   BUN 25 (H) 06/26/2016   CO2 23 06/26/2016   INR 1.03 06/21/2016   CBC Latest Ref Rng & Units 06/26/2016 06/25/2016 06/22/2016  WBC 4.0 - 10.5 K/uL 14.2(H) 13.8(H) 16.5(H)  Hemoglobin 13.0 - 17.0 g/dL 96.0 45.4 09.8  Hematocrit 39.0 - 52.0 % 40.9 40.4 44.1  Platelets 150 - 400 K/uL 180 176 201    No results found for: HGBA1C Lab Results  Component Value Date   ALT 59 06/21/2016   AST 66 (H) 06/21/2016   ALKPHOS 89 06/21/2016   BILITOT 1.1 06/21/2016   Serum ethanol less than 5 on admission  Radiology:  There is no new neuro imaging for review.  Other  diagnostic studies:  EEG from 06/22/16 was interpreted as showing moderate generalized slowing without epileptiform discharges or seizures. The back and frequencies are noted to be 5-6 hertz.  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 This has resulted in diffuse axonal injury and subarachnoid hemorrhage. Treatment is supportive. Hypoxemia and hypotension should be avoided, even for brief periods, as these are associated with worse overall neurological outcomes. Management is directed at sequelae from his injury is no acute surgical intervention is necessary at this time.  2. Subarachnoid hemorrhage: This is acute, due to trauma. Treatment is supportive.Continue to monitor blood pressure, keeping systolic blood pressure less than 140-160 mmHg. Antiplatelet agents and nonsteroidal anti-inflammatories are best avoided for 7-10 days following an acute traumatic hemorrhage. Therapeutic systemic anticoagulation is best deferred for 3-4 weeks.   3. 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 Treatment is supportive. Avoid  antiplatelet agents, nonsteroidal anti-inflammatories, and therapeutic systemic anticoagulation as noted above. The presence of significant diffuse axonal injury is most likely the underlying explanation for his ongoing encephalopathy.  4. Acute encephalopathy: This is due to traumatic brain injury with the most significant contributor probably being the extent of diffuse axonal injury present in his frontal lobes. Continue to optimize metabolic status as you are. Minimize CNS active medications, particularly opiates, benzodiazepines, and anything with strong anticholinergic properties. Continue amantadine for now. Appreciate speech therapy assistance. Patient will likely benefit from cognitive rehabilitation once he is better able to participate.   This was discussed with the patient's  daughter at the bedside. She is in agreement with the plan as noted. She was given the opportunity to ask any questions and these were addressed to her satisfaction.  Neurology will continue to follow. Please call with any questions or concerns.  Rhona Leavensimothy Oster, MD [facility]

## 2016-06-26 NOTE — Progress Notes (Signed)
Subjective: CC does not offer complaint. Per RN moaned and called out overnight.  Objective: Vital signs in last 24 hours: Temp:  [98.3 F (36.8 C)-100.5 F (38.1 C)] 99.5 F (37.5 C) (05/15 0400) Pulse Rate:  [66-97] 86 (05/15 0600) Resp:  [17-34] 31 (05/15 0600) BP: (140-175)/(79-158) 168/92 (05/15 0632) SpO2:  [91 %-98 %] 97 % (05/15 0600) Weight:  [81.1 kg (178 lb 12.7 oz)] 81.1 kg (178 lb 12.7 oz) (05/14 1700) Last BM Date:  (5/12)  Intake/Output from previous day: 05/14 0701 - 05/15 0700 In: 3176 [I.V.:2350; NG/GT:776; IV Piggyback:50] Out: 1750 [Urine:1750] Intake/Output this shift: No intake/output data recorded.  General appearance: no distress Neck: collar Resp: few rhonchi B Cardio: regular rate and rhythm GI: soft, NT, +BS  Neuro: repeats to command, fights opening eyes  Lab Results: CBC   Recent Labs  06/25/16 0315 06/26/16 0429  WBC 13.8* 14.2*  HGB 14.2 14.2  HCT 40.4 40.9  PLT 176 180   BMET  Recent Labs  06/25/16 0315 06/26/16 0429  NA 143 144  K 3.1* 3.1*  CL 112* 115*  CO2 22 23  GLUCOSE 107* 134*  BUN 22* 25*  CREATININE 0.79 0.68  CALCIUM 7.7* 7.9*   PT/INR No results for input(s): LABPROT, INR in the last 72 hours. ABG No results for input(s): PHART, HCO3 in the last 72 hours.  Invalid input(s): PCO2, PO2  Studies/Results: Dg Chest Port 1 View  Result Date: 06/24/2016 CLINICAL DATA:  Decreased breath sounds, history of chest trauma, new rhonchi EXAM: PORTABLE CHEST 1 VIEW COMPARISON:  06/23/2016 FINDINGS: Left lung is clear. Patchy right upper lobe opacity, suspicious for pneumonia, possibly on the basis of aspiration. Additional hazy opacity overlying the right lower lung, likely reflecting a pleural effusion, possibly loculated. No pneumothorax is seen. The heart is normal in size. IMPRESSION: Patchy right upper lobe opacity, suspicious for pneumonia, possibly on the basis of aspiration. Suspected right pleural effusion.  Electronically Signed   By: Charline Bills M.D.   On: 06/24/2016 18:47    Anti-infectives: Anti-infectives    Start     Dose/Rate Route Frequency Ordered Stop   06/25/16 1200  cefTRIAXone (ROCEPHIN) 2 g in dextrose 5 % 50 mL IVPB     2 g 100 mL/hr over 30 Minutes Intravenous Every 24 hours 06/25/16 1124        Assessment/Plan: Fall from ladder TBI w/ SAH and intraventricular hemorrhage  - EEG showed moderategeneralized slowing of brain activity and MRI showed traumatic parenchymal brain injury with multiple microhemorrhages - neurosurgery and neurology following, appreciate their recs - Amantadine per neurology - no indication for neurosurgical intervention Right clavicle fracture - WBAT BLE, Sling for RUE when out of bed, Dr. Eulah Pont recommends upright of clavicle if pt has neurological improvement right posterior 2-6 rib fractures with contusion and tiny PTX - pulmonary toilet ID - Rocephin for possible PNA, resp CX today Acute urinary retention - add urecholine today and try to D/C foley tomorrow R displaced comminuted sacrum fracture and R pubic rami fracture - Dr. Eulah Pont to clarify plan/WB status C collar in place - CT scan cervical showed no abnormalities, re-examine vs flex ex once more alert FEN: replace hypokalemia and hypophosphatemia, Mg OK. Speech is recommending NPO excepts meds, cortrak w/ tube feeds VTE: SCD's, no anticoagulation with SAH - will clarify with Dr. Bevely Palmer Dispo - Pt will likely need CIR for TIB, ortho recs pending on pelvic frxs then PT/OT I spoke with his wife  at the bedside.   LOS: 5 days    Violeta GelinasBurke Rajvi Armentor, MD, MPH, FACS Trauma: [phone] General Surgery: [phone]  5/15/2018Patient ID: Tyler Lane, male   DOB: 10/17/1950, 66 y.o.   MRN: [id number]

## 2016-06-26 NOTE — Progress Notes (Addendum)
Subjective: Patient reports states name, some one-word answers  Objective: Vital signs in last 24 hours: Temp:  [98.5 F (36.9 C)-100.5 F (38.1 C)] 99 F (37.2 C) (05/15 0800) Pulse Rate:  [71-97] 93 (05/15 1245) Resp:  [21-34] 25 (05/15 1245) BP: (136-172)/(79-111) 136/95 (05/15 1245) SpO2:  [91 %-98 %] 97 % (05/15 1245) Weight:  [81.1 kg (178 lb 12.7 oz)] 81.1 kg (178 lb 12.7 oz) (05/14 1700)  Intake/Output from previous day: 05/14 0701 - 05/15 0700 In: 3176 [I.V.:2350; NG/GT:776; IV Piggyback:50] Out: 1750 [Urine:1750] Intake/Output this shift: Total I/O In: 1012 [I.V.:400; NG/GT:390; IV Piggyback:222] Out: 650 [Urine:650]  Working with Therapy now. Doesn't open eyes, but will respond to some questions with one-word answers.   Lab Results:  Recent Labs  06/25/16 0315 06/26/16 0429  WBC 13.8* 14.2*  HGB 14.2 14.2  HCT 40.4 40.9  PLT 176 180   BMET  Recent Labs  06/25/16 0315 06/26/16 0429  NA 143 144  K 3.1* 3.1*  CL 112* 115*  CO2 22 23  GLUCOSE 107* 134*  BUN 22* 25*  CREATININE 0.79 0.68  CALCIUM 7.7* 7.9*    Studies/Results: Dg Chest Port 1 View  Result Date: 06/26/2016 CLINICAL DATA:  Fever. EXAM: PORTABLE CHEST 1 VIEW COMPARISON:  06/24/2016 .  06/23/2016. FINDINGS: Feeding tube noted with tip below left hemidiaphragm. Heart size stable. Diffuse right lung infiltrate, progressive from prior exam. Mild left base infiltrate. Small right pleural effusion again cannot be excluded . No pneumothorax. Heart size stable. Multiple right ribs and right clavicular fractures noted. IMPRESSION: 1.  Feeding tube noted with tip below left hemidiaphragm. 2. Diffuse right lung infiltrate, progressive prior exam. Mild left base infiltrate is also noted. Small right pleural effusion again cannot be excluded . Electronically Signed   By: Maisie Fushomas  Register   On: 06/26/2016 07:34   Dg Chest Port 1 View  Result Date: 06/24/2016 CLINICAL DATA:  Decreased breath sounds,  history of chest trauma, new rhonchi EXAM: PORTABLE CHEST 1 VIEW COMPARISON:  06/23/2016 FINDINGS: Left lung is clear. Patchy right upper lobe opacity, suspicious for pneumonia, possibly on the basis of aspiration. Additional hazy opacity overlying the right lower lung, likely reflecting a pleural effusion, possibly loculated. No pneumothorax is seen. The heart is normal in size. IMPRESSION: Patchy right upper lobe opacity, suspicious for pneumonia, possibly on the basis of aspiration. Suspected right pleural effusion. Electronically Signed   By: Charline BillsSriyesh  Krishnan M.D.   On: 06/24/2016 18:47    Assessment/Plan:   LOS: 5 days  Continue support. Cognitive rehab, hopeful of CIR.    P[place]at, Arlys JohnBrian 06/26/2016, 1:32 PM  Slow, gradual improvement.  Will need Rehab.

## 2016-06-26 NOTE — Progress Notes (Signed)
Inpatient Rehabilitation  Note pt.'s bedrest order has been discontinued and pt. can now be WBAT LEs  with therapies.  Pt. to have follow up xray after mobility.  Discussed case with Sidney AceJulie Amerson, RNCM.  We continue to await further progress before recommending IP Rehab consult.  Weldon PickingSusan Waymon Laser PT Inpatient Rehab Admissions Coordinator Cell [phone] Office [phone]\

## 2016-06-26 NOTE — Progress Notes (Signed)
Physical Therapy Treatment Patient Details Name: Tyler AskewDavid Lane MRN: [id number] DOB: 1950/12/31 Today's Date: 06/26/2016    History of Present Illness 66 y.o. male admitted after fall from 10 foot ladder, suffering TBI, SAH, right clavicle fracture, right superior rami fracture. MR Brain revealed TBI with multiple microhemorrhages of right frontal lobe, left occipital lobe and right thalamus. MD also reported "widespread axonal injury and moderate cortical dysfunction    PT Comments    Patient seen in conjunction with OT therapist, He moved to EOB with total A +2.  He was briefly able to support self with min guard assist, but required max A remainder of the time.  Max A +2 for standing.  He indicates significant pain with mobility.   He kept eyes closed throughout session.  Followed no commands, but answers occasionally simple yes/no questions.  Will continue to follow acutely as indicated.   Follow Up Recommendations  CIR (pending progress)     Equipment Recommendations   (TBD)    Recommendations for Other Services       Precautions / Restrictions Precautions Precautions: Fall;Cervical Precaution Comments: no clarification provided re: limitations/precautions Rt shoulder  Required Braces or Orthoses: Cervical Brace Restrictions RLE Weight Bearing: Weight bearing as tolerated LLE Weight Bearing: Weight bearing as tolerated Other Position/Activity Restrictions: awaiting clarification from ortho re: WB status/precautions  (for UE clavical fx)    Mobility  Bed Mobility Overal bed mobility: Needs Assistance Bed Mobility: Supine to Sit;Sit to Supine     Supine to sit: Total assist;+2 for physical assistance Sit to supine: Max assist;+2 for physical assistance   General bed mobility comments: Pt does not assist with moving to EOB.  He assisted minimally with lowering trunk to bed.  Required assist to move LEs onto bed   Transfers Overall transfer level: Needs  assistance Equipment used:  (two person face to face with chuck pad) Transfers: Sit to/from Stand Sit to Stand: Max assist;+2 physical assistance         General transfer comment: Max to total assist of 2 to power up to standing, patient in apparent pain and distress bia RLE, unable to maintain weight through legs at this time  Ambulation/Gait                 Stairs            Wheelchair Mobility    Modified Rankin (Stroke Patients Only) Modified Rankin (Stroke Patients Only) Pre-Morbid Rankin Score: No symptoms Modified Rankin: Severe disability     Balance Overall balance assessment: Needs assistance Sitting-balance support: Feet supported;Single extremity supported Sitting balance-Leahy Scale: Poor Sitting balance - Comments: Pt required max A to maintain EOB sitting x ~ 10 mins, however, as pt was assisted toward supine, he sat back up unsupported with min guard assist for ~ 3o seconds  Postural control: Posterior lean Standing balance support: Bilateral upper extremity supported Standing balance-Leahy Scale: Poor Standing balance comment: Pt requires max A +2 for standing                             Cognition Arousal/Alertness: Lethargic Behavior During Therapy: Restless Overall Cognitive Status: Impaired/Different from baseline Area of Impairment: Attention               Rancho Levels of Cognitive Functioning Ranch[place] MirantLos Amigos Scales of Cognitive Functioning: Confused/agitated   Current Attention Level: Focused           General Comments:  Pt did not follow commands, but does answer occasional simple yes/no questions       Exercises      General Comments General comments (skin integrity, edema, etc.): Pt incontinent of stool.  Assisted with peri care in supine.   Also assisted RN with transfer of pt from regular bed to ICU air mattress       Pertinent Vitals/Pain Pain Assessment: Faces Faces Pain Scale: Hurts whole  lot Pain Location: ribs, pelvis, clavicle  Pain Descriptors / Indicators: Grimacing;Guarding;Restless Pain Intervention(s): Repositioned    Home Living                      Prior Function            PT Goals (current goals can now be found in the care plan section) Acute Rehab PT Goals Patient Stated Goal: none stated PT Goal Formulation: Patient unable to participate in goal setting Time For Goal Achievement: 07/09/16 Potential to Achieve Goals: Good Progress towards PT goals: Progressing toward goals    Frequency    Min 3X/week      PT Plan Current plan remains appropriate    Co-evaluation PT/OT/SLP Co-Evaluation/Treatment: Yes Reason for Co-Treatment: Complexity of the patient's impairments (multi-system involvement);Necessary to address cognition/behavior during functional activity;For patient/therapist safety PT goals addressed during session: Mobility/safety with mobility OT goals addressed during session: ADL's and self-care      AM-PAC PT "6 Clicks" Daily Activity  Outcome Measure  Difficulty turning over in bed (including adjusting bedclothes, sheets and blankets)?: Total Difficulty moving from lying on back to sitting on the side of the bed? : Total Difficulty sitting down on and standing up from a chair with arms (e.g., wheelchair, bedside commode, etc,.)?: Total Help needed moving to and from a bed to chair (including a wheelchair)?: Total Help needed walking in hospital room?: Total Help needed climbing 3-5 steps with a railing? : Total 6 Click Score: 6    End of Session Equipment Utilized During Treatment: Cervical collar;Oxygen Activity Tolerance: Patient limited by fatigue;Patient limited by lethargy;Other (comment) Patient left: in bed;with call bell/phone within reach;with bed alarm set;with family/visitor present;with SCD's reapplied;Other (comment)   PT Visit Diagnosis: Other symptoms and signs involving the nervous system (Z61.096)      Time: 0454-0981 PT Time Calculation (min) (ACUTE ONLY): 29 min  Charges:  $Therapeutic Activity: 8-22 mins                    G Codes:       [name], PT DPT  [phone]    [name] 06/26/2016, 4:31 PM

## 2016-06-26 NOTE — Consult Note (Signed)
Physical Medicine and Rehabilitation Consult  Reason for Consult: TBI, right pelvic fracture, multiple rib fractures, right clavicle fracture Referring Physician: Dr. Lindie Spruce   HPI: Tyler Lane is a 66 y.o. male with history of HTN, depression, hearing loss who was admitted on 06/21/16 after unwitnessed fall off 10' ladder. He was found down with GCS 8 at admission. CT head done showing mild acute SAH along sylvian fissures bilaterally, at high left parietal lobe and righ frontal lobe. Work up also revealed displaced fracture of right mid clavicle, right 2nd to 6 rib fractures with pulmonary contusion, contusion, mildly displaced comminuted fracture of right pubic ramus and comminuted transverse fracture of proximal right pubic ramus and compression deformity of 6th and 7th thoracic vertebral body of uncertain etiology. Patient with aphasia and Dr.Stern question stroke. 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 EEG showed moderate generalized slowing due to non specific etiology. 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   Ortho/Dr. Eulah Pont consulted for input and recommended WBAT BLE and RUE with sling of comfort. Patient with  Moderate to severe cognitive linguistic impairment as well as evidence of dysphagia--NPO recommended.  He was started on Rocephin for RLL PNA. Cortack placed for nutritional support yesterday and PT/OT evaluations. He continues to have bouts of agitation, difficulty following simple commands, able to answer basic Y/N questions with one word answers and requiring total assist with basic ADL tasks and mobility. Currently Rancho IV  emerging Rancho V. CIR recommended for follow up therapy.   Discussed with Dr. Lindie Spruce, waxing and waning cognitive status, may have some sedation from pain medications.   Review of Systems  Unable to perform ROS: Mental acuity      Past Medical History:  Diagnosis Date  . Depression   . Heart murmur    previously  . High cholesterol   . HOH (hard of hearing)    mild in the right and moderate to severe in the left  . Hypertension   . Tinnitus of left ear     Past Surgical History:  Procedure Laterality Date  . NASAL SINUS SURGERY        Family History  Problem Relation Age of Onset  . Diabetes Mother   . Heart attack Father       Social History:  Reports that he's married and works. Per reports that he has been smoking Cigarettes.  He has a 20.00 pack-year smoking history. He has never used smokeless tobacco. Per reports that he does not drink alcohol or use drugs.    Allergies: No Known Allergies    Medications Prior to Admission  Medication Sig Dispense Refill  . aspirin EC 81 MG tablet Take 81 mg by mouth daily.    . calcium carbonate (TUMS - DOSED IN MG ELEMENTAL CALCIUM) 500 MG chewable tablet Chew 1-2 tablets by mouth daily as needed for indigestion or heartburn.    . citalopram (CELEXA) 40 MG tablet Take 40 mg by mouth daily.    . hydrochlorothiazide (HYDRODIURIL) 25 MG tablet Take 25 mg by mouth daily.    Marland Kitchen ibuprofen (ADVIL,MOTRIN) 200 MG tablet Take 200-800 mg by mouth every 6 (six) hours as needed for mild pain.    Marland Kitchen loratadine (  CLARITIN) 10 MG tablet Take 10 mg by mouth daily.    Marland Kitchen lovastatin (MEVACOR) 20 MG tablet Take 40 mg by mouth daily.      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  He assisted minimally with lowering trunk to bed.  Required assist to move LEs onto bed  Transfers Overall transfer level: Needs assistance Equipment used:  (two person face to face with chuck pad) Transfers: Sit to/from Stand Sit to Stand: Max assist, +2 physical assistance General transfer comment: Max to total assist of 2 to power up to standing, patient in apparent pain and distress bia RLE, unable to maintain weight through legs at this time      ADL: ADL Overall ADL's : Needs assistance/impaired Eating/Feeding: NPO Grooming: Wash/dry hands, Wash/dry face, Oral care, Brushing hair, Total assistance, Bed level Upper Body Bathing: Total assistance, Bed level Lower Body Bathing: Total assistance, Bed level Upper Body Dressing : Total assistance, Bed level Lower Body Dressing: Total assistance, Bed level Toilet Transfer: Total assistance Toilet Transfer Details (indicate cue type and reason): unable  Toileting- Clothing Manipulation and Hygiene: Total assistance, Bed level General ADL Comments: Pt unable to engage in ADLs today   Cognition: Cognition Overall Cognitive Status: Impaired/Different from baseline Arousal/Alertness: Lethargic Orientation Level: Oriented to person, Disoriented to place, Disoriented to time, Disoriented to situation Attention: Focused Focused Attention: Impaired Focused Attention Impairment: Verbal basic, Functional basic Memory: Impaired Awareness: Impaired Awareness Impairment: Intellectual impairment Problem Solving: Impaired Problem Solving Impairment: Verbal  basic, Functional basic Safety/Judgment: Impaired Rancho Mirant Scales of Cognitive Functioning: Confused/agitated Cognition Arousal/Alertness: Lethargic Behavior During Therapy: Restless Overall Cognitive Status: Impaired/Different from baseline Area of Impairment: Attention Orientation Level: Disoriented to, Place, Time, Situation Current Attention Level: Focused Following Commands: Follows one step commands inconsistently General Comments: Pt did not follow commands, but does answer occasional simple yes/no questions   Blood pressure (!) 160/104, pulse 94, temperature 98.6 F (37 C), temperature source Axillary, resp. rate (!) 28, height 5\' 11"  (1.803 m), weight 84.4 kg (186 lb 1.1 oz), SpO2 (!) 87 %. Physical Exam  Nursing note and vitals reviewed. Constitutional: He appears well-developed and well-nourished. He appears lethargic.  Restless and moves LUE and BLE intermittently. in bed--slumped to the left with legs dangling off edge. Cortak and bilateral mittens in place. Arouses briefly to sternal rubs.   HENT:  Head: Normocephalic and atraumatic.  Eyes: Conjunctivae are normal.  Neck:  Cervical collar in place.  Cardiovascular: Normal rate and regular rhythm.   Respiratory: Effort normal. No respiratory distress. He exhibits tenderness.  Congested sounds with crackles throughout lungs.   GI: Soft. Bowel sounds are normal. He exhibits no distension. There is no tenderness.  Musculoskeletal: He exhibits no edema or tenderness.  Onychomycotic  toe nails with erythema around all nail beds.   Neurological: He appears lethargic.  Oriented to self and DOB. Age "21". Moderate dysarthria and responds to  pain. Restless and inattentive. Unable to follow simple motor commands. Decrease movement RUE at shoulder.   Skin: Skin is warm and dry.    Results for orders placed or performed during the hospital encounter of 06/21/16 (from the past 24 hour(s))  Glucose, capillary     Status:  Abnormal   Collection Time: 06/26/16 11:19 AM  Result Value Ref Range   Glucose-Capillary 135 (H) 65 - 99 mg/dL   Comment 1 Notify RN    Comment 2 Document in Chart   Glucose, capillary     Status: Abnormal   Collection Time: 06/26/16  3:48 PM  Result Value Ref Range   Glucose-Capillary 136 (H) 65 - 99 mg/dL   Comment 1 Notify RN    Comment 2 Document in Chart   Culture, respiratory (NON-Expectorated)     Status: None (Preliminary result)   Collection Time: 06/26/16  5:40 PM  Result Value Ref Range   Specimen Description TRACHEAL ASPIRATE    Special Requests NONE    Gram Stain      ABUNDANT WBC PRESENT, PREDOMINANTLY PMN FEW GRAM POSITIVE COCCI IN PAIRS RARE GRAM NEGATIVE RODS    Culture PENDING    Report Status PENDING   Magnesium     Status: None   Collection Time: 06/26/16  6:17 PM  Result Value Ref Range   Magnesium 2.3 1.7 - 2.4 mg/dL  Phosphorus     Status: Abnormal   Collection Time: 06/26/16  6:17 PM  Result Value Ref Range   Phosphorus 2.3 (L) 2.5 - 4.6 mg/dL  Glucose, capillary     Status: Abnormal   Collection Time: 06/26/16  8:02 PM  Result Value Ref Range   Glucose-Capillary 121 (H) 65 - 99 mg/dL  Glucose, capillary     Status: Abnormal   Collection Time: 06/26/16 11:39 PM  Result Value Ref Range   Glucose-Capillary 154 (H) 65 - 99 mg/dL  CBC     Status: Abnormal   Collection Time: 06/27/16  2:46 AM  Result Value Ref Range   WBC 16.3 (H) 4.0 - 10.5 K/uL   RBC 4.47 4.22 - 5.81 MIL/uL   Hemoglobin 14.3 13.0 - 17.0 g/dL   HCT 95.640.4 21.339.0 - 08.652.0 %   MCV 90.4 78.0 - 100.0 fL   MCH 32.0 26.0 - 34.0 pg   MCHC 35.4 30.0 - 36.0 g/dL   RDW 57.814.2 46.911.5 - 62.915.5 %   Platelets 197 150 - 400 K/uL  Basic metabolic panel     Status: Abnormal   Collection Time: 06/27/16  2:46 AM  Result Value Ref Range   Sodium 143 135 - 145 mmol/L   Potassium 3.0 (L) 3.5 - 5.1 mmol/L   Chloride 115 (H) 101 - 111 mmol/L   CO2 22 22 - 32 mmol/L   Glucose, Bld 131 (H) 65 - 99 mg/dL    BUN 22 (H) 6 - 20 mg/dL   Creatinine, Ser 5.280.69 0.61 - 1.24 mg/dL   Calcium 7.8 (L) 8.9 - 10.3 mg/dL   GFR calc non Af Amer >60 >60 mL/min   GFR calc Af Amer >60 >60 mL/min   Anion gap 6 5 - 15  Glucose, capillary     Status: Abnormal   Collection Time: 06/27/16  3:36 AM  Result Value Ref Range   Glucose-Capillary 116 (H) 65 - 99 mg/dL  Glucose, capillary     Status: Abnormal   Collection Time: 06/27/16  7:46 AM  Result Value Ref Range  Glucose-Capillary 113 (H) 65 - 99 mg/dL   Comment 1 Notify RN    Comment 2 Document in Chart    Dg Chest Port 1 View  Result Date: 06/27/2016 CLINICAL DATA:  Respiratory failure, recent chest trauma, history of traumatic brain injury. EXAM: PORTABLE CHEST 1 VIEW COMPARISON:  Chest x-ray of Jun 26, 2016 and chest CT scan of Jun 21, 2016 FINDINGS: The lungs are well-expanded. Infiltrate throughout the right lung has increased. Minimal left basilar density persists. No definite pleural effusion is observed though there may be pleural fluid layering posteriorly on the right. There is no mediastinal shift. The heart and pulmonary vascularity are normal. There is calcification in the wall of the aortic arch. The feeding tube tip projects below the inferior margin of the image. IMPRESSION: Diffuse increased interstitial density throughout the right lung most compatible with known ammonia. Possible posterior layering of pleural effusion on the right. Asymmetric pulmonary edema is felt less likely. Minimal left basilar atelectasis is suspected. Thoracic aortic atherosclerosis. Electronically Signed   By: Jyren  [place] M.D.   On: 06/27/2016 07:28   Dg Chest Port 1 View  Result Date: 06/26/2016 CLINICAL DATA:  Fever. EXAM: PORTABLE CHEST 1 VIEW COMPARISON:  06/24/2016 .  06/23/2016. FINDINGS: Feeding tube noted with tip below left hemidiaphragm. Heart size stable. Diffuse right lung infiltrate, progressive from prior exam. Mild left base infiltrate. Small right pleural  effusion again cannot be excluded . No pneumothorax. Heart size stable. Multiple right ribs and right clavicular fractures noted. IMPRESSION: 1.  Feeding tube noted with tip below left hemidiaphragm. 2. Diffuse right lung infiltrate, progressive prior exam. Mild left base infiltrate is also noted. Small right pleural effusion again cannot be excluded . 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 Does the need for close, 24 hr/day medical supervision in concert with the patient's rehab needs make it unreasonable for this patient to be served in a less intensive setting? Yes 2. Co-Morbidities requiring supervision/potential complications: Right clavicular fracture, right lower lobe pneumonia, right second to sixth rib fracture, right pubic ramus fracture 3. Due to bladder management, bowel management, safety, skin/wound care, disease management, medication administration, pain management and patient education, does the patient require 24 hr/day rehab nursing? Yes 4. Does the patient require coordinated care of a physician, rehab nurse, PT (1-2 hrs/day, 5 days/week), OT (1-2 hrs/day, 5 days/week) and SLP (.5-1 hrs/day, 5 days/week) to address physical and functional deficits in the context of the above medical diagnosis(es)? Yes Addressing deficits in the following areas: balance, endurance, locomotion, strength, transferring, bowel/bladder control, bathing, dressing, feeding, grooming, toileting, cognition, speech, language, swallowing and psychosocial support 5. Can the patient actively participate in an intensive therapy program of at least 3 hrs of therapy per day at least 5 days per week? No 6. The potential for patient to make measurable gains while on inpatient rehab is fair 7. Anticipated functional outcomes upon discharge from inpatient rehab are supervision and min assist  with PT,  supervision and min assist with OT, supervision and min assist with SLP. 8. Estimated rehab length of stay to reach the above functional goals is: 21-25d 9. Anticipated D/C setting: Home 10. Anticipated post D/C treatments: HH therapy 11. Overall Rehab/Functional Prognosis: good  RECOMMENDATIONS: This patient's condition is appropriate for continued rehabilitative care in the following setting: CIR Patient has agreed to participate in recommended program.  N/A Note that insurance prior authorization may be required for reimbursement for recommended care.  Comment: As discussed with Dr. Lindie Spruce, not ready to tolerate therapy but should be ready in 2-3 days  Erick Colace M.D. [facility] FAAPM&R (Sports Med, Neuromuscular Med) Diplomate Am Board of Electrodiagnostic Med  Jerene Pitch 06/27/2016

## 2016-06-26 NOTE — Progress Notes (Signed)
I have reviewed his Pelvic imaging and he has a R rami and minimally displaced R sacral fracture. These appear stable   Plan: WBAT BLE, repeat pelvic xrays once mobilized    Nashla Althoff D

## 2016-06-26 NOTE — Care Management Note (Signed)
Case Management Note  Patient Details  Name: Tyler AskewDavid Lane MRN: [id number] Date of Birth: 11/23/1950  Subjective/Objective:   Pt admitted on 06/21/16 after fall from a 10 foot ladder with TBI, SAH, Rt clavicle fx, and Rt superior rami fx.  PTA, pt independent of ADLS, lives with significant other of many years; has supportive daughter.                   Action/Plan: TBI team to see; will follow as pt progresses.    Expected Discharge Date:                  Expected Discharge Plan:  IP Rehab Facility  In-House Referral:     Discharge planning Services  CM Consult  Post Acute Care Choice:    Choice offered to:     DME Arranged:    DME Agency:     HH Arranged:    HH Agency:     Status of Service:  In process, will continue to follow  If discussed at Long Length of Stay Meetings, dates discussed:    Additional Comments:  06/26/16 J. Talayla Doyel, RN, BSN Bedrest order now lifted; therapies now working with pt.  Pt currently 2+ assist with all mobility; unable to engage in ADLS today.  Rehab consult pending further progress with therapies.    Quintella BatonJulie W. Ticara Waner, RN, BSN  Trauma/Neuro ICU Case Manager [phone]

## 2016-06-27 ENCOUNTER — Encounter (HOSPITAL_COMMUNITY): Payer: Self-pay | Admitting: Physical Medicine and Rehabilitation

## 2016-06-27 ENCOUNTER — Inpatient Hospital Stay (HOSPITAL_COMMUNITY): Payer: Medicare HMO

## 2016-06-27 LAB — BASIC METABOLIC PANEL
ANION GAP: 6 (ref 5–15)
BUN: 22 mg/dL — AB (ref 6–20)
CHLORIDE: 115 mmol/L — AB (ref 101–111)
CO2: 22 mmol/L (ref 22–32)
Calcium: 7.8 mg/dL — ABNORMAL LOW (ref 8.9–10.3)
Creatinine, Ser: 0.69 mg/dL (ref 0.61–1.24)
GFR calc Af Amer: >60 mL/min (ref >60)
Glucose, Bld: 131 mg/dL — ABNORMAL HIGH (ref 65–99)
POTASSIUM: 3 mmol/L — AB (ref 3.5–5.1)
SODIUM: 143 mmol/L (ref 135–145)

## 2016-06-27 LAB — CBC
HCT: 40.4 % (ref 39.0–52.0)
HEMOGLOBIN: 14.3 g/dL (ref 13.0–17.0)
MCH: 32 pg (ref 26.0–34.0)
MCHC: 35.4 g/dL (ref 30.0–36.0)
MCV: 90.4 fL (ref 78.0–100.0)
PLATELETS: 197 10*3/uL (ref 150–400)
RBC: 4.47 MIL/uL (ref 4.22–5.81)
RDW: 14.2 % (ref 11.5–15.5)
WBC: 16.3 10*3/uL — AB (ref 4.0–10.5)

## 2016-06-27 LAB — GLUCOSE, CAPILLARY
GLUCOSE-CAPILLARY: 113 mg/dL — AB (ref 65–99)
GLUCOSE-CAPILLARY: 120 mg/dL — AB (ref 65–99)
GLUCOSE-CAPILLARY: 133 mg/dL — AB (ref 65–99)
Glucose-Capillary: 116 mg/dL — ABNORMAL HIGH (ref 65–99)
Glucose-Capillary: 124 mg/dL — ABNORMAL HIGH (ref 65–99)
Glucose-Capillary: 113 mg/dL — ABNORMAL HIGH (ref 65–99)

## 2016-06-27 MED ORDER — FUROSEMIDE 10 MG/ML IJ SOLN
40.0000 mg | Freq: Once | INTRAMUSCULAR | Status: AC
  Administered 2016-06-27: 40 mg via INTRAVENOUS
  Filled 2016-06-27: qty 4

## 2016-06-27 MED ORDER — FREE WATER
200.0000 mL | Freq: Three times a day (TID) | Status: DC
  Administered 2016-06-27 – 2016-07-01 (×13): 200 mL

## 2016-06-27 MED ORDER — AMANTADINE HCL 50 MG/5ML PO SYRP
100.0000 mg | ORAL_SOLUTION | Freq: Two times a day (BID) | ORAL | Status: DC
  Administered 2016-06-27 – 2016-07-02 (×11): 100 mg
  Filled 2016-06-27 (×13): qty 10

## 2016-06-27 MED ORDER — POTASSIUM CHLORIDE 10 MEQ/100ML IV SOLN
10.0000 meq | INTRAVENOUS | Status: AC
  Administered 2016-06-27 (×4): 10 meq via INTRAVENOUS
  Filled 2016-06-27 (×3): qty 100

## 2016-06-27 MED ORDER — MORPHINE SULFATE (PF) 4 MG/ML IV SOLN
2.0000 mg | INTRAVENOUS | Status: DC | PRN
  Administered 2016-06-27 – 2016-07-01 (×8): 4 mg via INTRAVENOUS
  Filled 2016-06-27 (×8): qty 1

## 2016-06-27 MED ORDER — AMANTADINE HCL 50 MG/5ML PO SYRP
100.0000 mg | ORAL_SOLUTION | Freq: Two times a day (BID) | ORAL | Status: DC
  Filled 2016-06-27: qty 10

## 2016-06-27 MED ORDER — POTASSIUM CHLORIDE CRYS ER 20 MEQ PO TBCR
20.0000 meq | EXTENDED_RELEASE_TABLET | Freq: Three times a day (TID) | ORAL | Status: DC
  Administered 2016-06-27 – 2016-06-28 (×6): 20 meq via ORAL
  Filled 2016-06-27 (×6): qty 1

## 2016-06-27 NOTE — Progress Notes (Signed)
No issues overnight. Pt denies any current complaints  EXAM:  BP (!) 150/90   Pulse 98   Temp 100 F (37.8 C) (Axillary)   Resp (!) 23   Ht 5\' 11"  (1.803 m)   Wt 84.4 kg (186 lb 1.1 oz)   SpO2 97%   BMI 25.95 kg/m   Easily arousable Answers simple quesions CN grossly intact  MAE  IMPRESSION:  66 y.o. male s/p fall with likely DAI type injury, appears somewhat improved today  PLAN: - cont current supportive care

## 2016-06-27 NOTE — Progress Notes (Signed)
Trauma Service Note  Subjective: Patient will verbalize a bit, says that his chest hurts.  Wants to eat.    Objective: Vital signs in last 24 hours: Temp:  [97.9 F (36.6 C)-99.5 F (37.5 C)] 99 F (37.2 C) (05/16 0800) Pulse Rate:  [62-104] 70 (05/16 0800) Resp:  [23-36] 25 (05/16 0800) BP: (131-176)/(80-111) 161/92 (05/16 0800) SpO2:  [87 %-100 %] 98 % (05/16 0800) Weight:  [84.4 kg (186 lb 1.1 oz)] 84.4 kg (186 lb 1.1 oz) (05/16 0500) Last BM Date: 06/26/16  Intake/Output from previous day: 05/15 0701 - 05/16 0700 In: 4052 [I.V.:2300; NG/GT:1530; IV Piggyback:222] Out: 2350 [Urine:2350] Intake/Output this shift: Total I/O In: 160 [I.V.:100; NG/GT:60] Out: 200 [Urine:200]  General: No acute distress.  Seems to be communicating more verbally  Lungs: Clear.  CXR shows diffuse whitening of the right lung field.  Almost looks like an effusion, but was done in the upright position.  Abd: Soft, good bowel sounds.  Tolerating tube feedings well through Cortrak.  Extremities: No changes.  No clinical signs or symptoms of DVT.  Neuro: Will verbalize appropriately intermittently.  Moves all fours.  Not cooperative all the time.  Lab Results: CBC   Recent Labs  06/26/16 0429 06/27/16 0246  WBC 14.2* 16.3*  HGB 14.2 14.3  HCT 40.9 40.4  PLT 180 197   BMET  Recent Labs  06/26/16 0429 06/27/16 0246  NA 144 143  K 3.1* 3.0*  CL 115* 115*  CO2 23 22  GLUCOSE 134* 131*  BUN 25* 22*  CREATININE 0.68 0.69  CALCIUM 7.9* 7.8*   PT/INR No results for input(s): LABPROT, INR in the last 72 hours. ABG No results for input(s): PHART, HCO3 in the last 72 hours.  Invalid input(s): PCO2, PO2  Studies/Results: Dg Chest Port 1 View  Result Date: 06/27/2016 CLINICAL DATA:  Respiratory failure, recent chest trauma, history of traumatic brain injury. EXAM: PORTABLE CHEST 1 VIEW COMPARISON:  Chest x-ray of Jun 26, 2016 and chest CT scan of Jun 21, 2016 FINDINGS: The lungs are  well-expanded. Infiltrate throughout the right lung has increased. Minimal left basilar density persists. No definite pleural effusion is observed though there may be pleural fluid layering posteriorly on the right. There is no mediastinal shift. The heart and pulmonary vascularity are normal. There is calcification in the wall of the aortic arch. The feeding tube tip projects below the inferior margin of the image. IMPRESSION: Diffuse increased interstitial density throughout the right lung most compatible with known ammonia. Possible posterior layering of pleural effusion on the right. Asymmetric pulmonary edema is felt less likely. Minimal left basilar atelectasis is suspected. Thoracic aortic atherosclerosis. Electronically Signed   By: Everardo  [place] M.D.   On: 06/27/2016 07:28   Dg Chest Port 1 View  Result Date: 06/26/2016 CLINICAL DATA:  Fever. EXAM: PORTABLE CHEST 1 VIEW COMPARISON:  06/24/2016 .  06/23/2016. FINDINGS: Feeding tube noted with tip below left hemidiaphragm. Heart size stable. Diffuse right lung infiltrate, progressive from prior exam. Mild left base infiltrate. Small right pleural effusion again cannot be excluded . No pneumothorax. Heart size stable. Multiple right ribs and right clavicular fractures noted. IMPRESSION: 1.  Feeding tube noted with tip below left hemidiaphragm. 2. Diffuse right lung infiltrate, progressive prior exam. Mild left base infiltrate is also noted. Small right pleural effusion again cannot be excluded . Electronically Signed   By: Maisie Fus  Register   On: 06/26/2016 07:34    Anti-infectives: Anti-infectives    Start  Dose/Rate Route Frequency Ordered Stop   06/25/16 1200  cefTRIAXone (ROCEPHIN) 2 g in dextrose 5 % 50 mL IVPB     2 g 100 mL/hr over 30 Minutes Intravenous Every 24 hours 06/25/16 1124        Assessment/Plan: s/p  Transfer to SDU.  IVF at 100cc per hour with TFs at 60, will decrease IVFand give a dose of Lasix. Hypokalemia persists.   Will supplement with some IVF KCL  LOS: 6 days   Marta LamasJames O. Gae BonWyatt, III, MD, FACS [phone] Trauma Surgeon 06/27/2016

## 2016-06-27 NOTE — Progress Notes (Signed)
Rehab admissions - I am following for potential acute inpatient rehab admission.  I will follow up with patient/family in am.  Call me for questions.  #[phone]

## 2016-06-27 NOTE — Progress Notes (Signed)
Neurology Progress Note  Subjective: No major overnight events reported. His mentation is better this morning. He is now following commands consistently. He is speaking more, able to participate in a conversation with me. He is still confused and encephalopathic but this has improved. He denies any pain or discomfort. He want me to take the mitts off of his hands. No other complaints reported on 10-point ROS.   Medications reviewed and reconciled.   Pertinent meds: Amantadine 100 mg twice daily Nicotine patch 21 mg daily Bethanechol 25 mg TID Ceftriaxone 2 g q24h  Current Meds:   Current Facility-Administered Medications:  .  0.9 %  sodium chloride infusion, , Intravenous, Continuous, Jimmye Norman, MD, Last Rate: 20 mL/hr at 06/27/16 0954 .  acetaminophen (TYLENOL) solution 650 mg, 650 mg, Per Tube, Q6H PRN, Violeta Gelinas, MD .  acetaminophen (TYLENOL) suppository 650 mg, 650 mg, Rectal, Q4H PRN, Barnett Abu, MD, 650 mg at 06/24/16 2358 .  amantadine (SYMMETREL) 50 MG/5ML solution 100 mg, 100 mg, Per Tube, BID, Jimmye Norman, MD, 100 mg at 06/27/16 1033 .  bethanechol (URECHOLINE) tablet 25 mg, 25 mg, Per Tube, TID, Violeta Gelinas, MD, 25 mg at 06/27/16 1018 .  cefTRIAXone (ROCEPHIN) 2 g in dextrose 5 % 50 mL IVPB, 2 g, Intravenous, Q24H, Focht, Jessica L, PA, Stopped at 06/26/16 1304 .  chlorhexidine (PERIDEX) 0.12 % solution 15 mL, 15 mL, Mouth Rinse, BID, Abigail Miyamoto, MD, 15 mL at 06/27/16 1036 .  feeding supplement (PIVOT 1.5 CAL) liquid 1,000 mL, 1,000 mL, Per Tube, Q24H, Jimmye Norman, MD, Last Rate: 60 mL/hr at 06/27/16 0700, 1,000 mL at 06/27/16 0700 .  free water 200 mL, 200 mL, Per Tube, Q8H, Jimmye Norman, MD, 200 mL at 06/27/16 0930 .  hydrALAZINE (APRESOLINE) injection 10 mg, 10 mg, Intravenous, Q6H PRN, Axel Filler, MD, 10 mg at 06/26/16 2253 .  HYDROcodone-acetaminophen (HYCET) 7.5-325 mg/15 ml solution 15 mL, 15 mL, Per Tube, Q4H PRN, Violeta Gelinas, MD, 15 mL  at 06/27/16 1018 .  LORazepam (ATIVAN) injection 0.5-1 mg, 0.5-1 mg, Intravenous, Q4H PRN, Barnett Abu, MD, 1 mg at 06/26/16 2234 .  MEDLINE mouth rinse, 15 mL, Mouth Rinse, q12n4p, Abigail Miyamoto, MD, 15 mL at 06/26/16 1710 .  morphine 4 MG/ML injection 2-4 mg, 2-4 mg, Intravenous, Q4H PRN, Jimmye Norman, MD .  nicotine (NICODERM CQ - dosed in mg/24 hours) patch 21 mg, 21 mg, Transdermal, Daily, Jimmye Norman, MD, 21 mg at 06/27/16 1035 .  ondansetron (ZOFRAN) tablet 4 mg, 4 mg, Oral, Q6H PRN **OR** ondansetron (ZOFRAN) injection 4 mg, 4 mg, Intravenous, Q6H PRN, Abigail Miyamoto, MD .  potassium chloride 10 mEq in 100 mL IVPB, 10 mEq, Intravenous, Q1 Hr x 4, Jimmye Norman, MD, Stopped at 06/27/16 1159 .  potassium chloride SA (K-DUR,KLOR-CON) CR tablet 20 mEq, 20 mEq, Oral, TID, Jimmye Norman, MD, 20 mEq at 06/27/16 1127  Objective:  Temp:  [97.9 F (36.6 C)-99.5 F (37.5 C)] 99 F (37.2 C) (05/16 0800) Pulse Rate:  [62-104] 70 (05/16 0800) Resp:  [23-36] 25 (05/16 0800) BP: (131-176)/(80-104) 161/92 (05/16 0800) SpO2:  [87 %-100 %] 98 % (05/16 0800) Weight:  [84.4 kg (186 lb 1.1 oz)] 84.4 kg (186 lb 1.1 oz) (05/16 0500)  General: WDWN Caucasian man lying in bed. He opens his eyes to voice. He is oriented to self. He knows he is in the hospital but could not tell me which one. He is not oriented to time. His speech is moderately  dysarthria but this could be at least in part artifactual due to cervical collar and position in bed. He follows midline and appendicular commands. Speed of processing is mildly delayed.  HEENT: Cervical collar in place. He denies pain on palpation of the cervical spine and has no pain with movement of the head and neck. His oropharynx is very dry. Sclerae are anicteric. He has mild conjunctival injection bilaterally.   CV: Regular, no murmur. Carotid pulses are 2+ and symmetric with no bruits. Distal pulses 2+ and symmetric.  Lungs: CTAB on anterior  exam. Extremities: No C/C/E. Neuro: MS: As noted above.  CN: Pupils are equal and reactive from 3-->2 mm bilaterally. His eyes are conjugate in intact extraocular movements. Corneals are intact. His face is symmetric and strong. Hearing is diminished to conversational voice. B traps 5/5. His tongue protrudes to midline.   Motor: Normal bulk, tone, strength throughout. No tremor or other abnormal movements are observed.  Sensation: He withdraws purposefully from minimal noxious stimuli 4.  DTRs: 3 +, symmetric. He has sustained clonus at both ankles. Crossed adductors are present bilaterally. Toes are upgoing bilaterally. Hoffmann's reflexes present bilaterally.  Coordination: No dysmetria with spontaneous movements. He had some difficulty following commands for formal cerebellar testing.  Labs: Lab Results  Component Value Date   WBC 16.3 (H) 06/27/2016   HGB 14.3 06/27/2016   HCT 40.4 06/27/2016   PLT 197 06/27/2016   GLUCOSE 131 (H) 06/27/2016   ALT 59 06/21/2016   AST 66 (H) 06/21/2016   NA 143 06/27/2016   K 3.0 (L) 06/27/2016   CL 115 (H) 06/27/2016   CREATININE 0.69 06/27/2016   BUN 22 (H) 06/27/2016   CO2 22 06/27/2016   INR 1.03 06/21/2016   CBC Latest Ref Rng & Units 06/27/2016 06/26/2016 06/25/2016  WBC 4.0 - 10.5 K/uL 16.3(H) 14.2(H) 13.8(H)  Hemoglobin 13.0 - 17.0 g/dL 16.1 09.6 04.5  Hematocrit 39.0 - 52.0 % 40.4 40.9 40.4  Platelets 150 - 400 K/uL 197 180 176    No results found for: HGBA1C Lab Results  Component Value Date   ALT 59 06/21/2016   AST 66 (H) 06/21/2016   ALKPHOS 89 06/21/2016   BILITOT 1.1 06/21/2016     Radiology:  There is no new neuro imaging for review.  Other diagnostic studies:  EEG from 06/22/16 was interpreted as showing moderate generalized slowing without epileptiform discharges or seizures. The back and frequencies are noted to be 5-6 hertz.  A/P:   1. 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 This has resulted in diffuse axonal injury and subarachnoid hemorrhage. Continue supportive care. Avoid hypoxemia and hypotension.    2. Subarachnoid hemorrhage: This is acute, due to trauma. Treatment is supportive. Watch BP, avoid SBP >160. Antiplatelet agents and nonsteroidal anti-inflammatories are best avoided for 7-10 days following an acute traumatic hemorrhage. Therapeutic systemic anticoagulation is best deferred for 3-4 weeks.   3. 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 Treatment is supportive. Avoid antiplatelet agents, nonsteroidal anti-inflammatories, and therapeutic systemic anticoagulation as noted above.   4. Acute encephalopathy: This is due to traumatic brain injury. He is much improved this morning. Continue to optimize metabolic status as you are. Minimize CNS active medications, particularly opiates, benzodiazepines, and anything with strong anticholinergic properties. Continue amantadine given improvement today. Appreciate speech therapy assistance. Patient will likely benefit from cognitive rehabilitation once he is better able  to participate.   No family was present at the time of my visit.   Neurology will continue to follow. Please call with any questions or concerns.  Rhona Leavensimothy Cleon Signorelli, MD [facility]

## 2016-06-28 ENCOUNTER — Inpatient Hospital Stay (HOSPITAL_COMMUNITY): Payer: Medicare HMO

## 2016-06-28 LAB — CBC WITH DIFFERENTIAL/PLATELET
BASOS PCT: 0 %
Basophils Absolute: 0 10*3/uL (ref 0.0–0.1)
EOS ABS: 0.3 10*3/uL (ref 0.0–0.7)
Eosinophils Relative: 2 %
HCT: 42.4 % (ref 39.0–52.0)
HEMOGLOBIN: 14.5 g/dL (ref 13.0–17.0)
LYMPHS ABS: 1.8 10*3/uL (ref 0.7–4.0)
Lymphocytes Relative: 11 %
MCH: 31.1 pg (ref 26.0–34.0)
MCHC: 34.2 g/dL (ref 30.0–36.0)
MCV: 91 fL (ref 78.0–100.0)
MONO ABS: 1.6 10*3/uL — AB (ref 0.1–1.0)
Monocytes Relative: 10 %
NEUTROS ABS: 12.4 10*3/uL — AB (ref 1.7–7.7)
Neutrophils Relative %: 77 %
PLATELETS: 220 10*3/uL (ref 150–400)
RBC: 4.66 MIL/uL (ref 4.22–5.81)
RDW: 14.3 % (ref 11.5–15.5)
WBC: 16.1 10*3/uL — ABNORMAL HIGH (ref 4.0–10.5)

## 2016-06-28 LAB — GLUCOSE, CAPILLARY
GLUCOSE-CAPILLARY: 101 mg/dL — AB (ref 65–99)
Glucose-Capillary: 131 mg/dL — ABNORMAL HIGH (ref 65–99)
Glucose-Capillary: 115 mg/dL — ABNORMAL HIGH (ref 65–99)
Glucose-Capillary: 107 mg/dL — ABNORMAL HIGH (ref 65–99)
Glucose-Capillary: 120 mg/dL — ABNORMAL HIGH (ref 65–99)

## 2016-06-28 LAB — BASIC METABOLIC PANEL
ANION GAP: 7 (ref 5–15)
BUN: 23 mg/dL — ABNORMAL HIGH (ref 6–20)
CALCIUM: 8.1 mg/dL — AB (ref 8.9–10.3)
CO2: 20 mmol/L — AB (ref 22–32)
CREATININE: 0.73 mg/dL (ref 0.61–1.24)
Chloride: 115 mmol/L — ABNORMAL HIGH (ref 101–111)
GFR calc Af Amer: >60 mL/min (ref >60)
GLUCOSE: 116 mg/dL — AB (ref 65–99)
Potassium: 3.4 mmol/L — ABNORMAL LOW (ref 3.5–5.1)
Sodium: 142 mmol/L (ref 135–145)

## 2016-06-28 MED ORDER — POTASSIUM CHLORIDE 10 MEQ/100ML IV SOLN
10.0000 meq | INTRAVENOUS | Status: AC
  Administered 2016-06-28 (×4): 10 meq via INTRAVENOUS
  Filled 2016-06-28 (×4): qty 100

## 2016-06-28 MED ORDER — FUROSEMIDE 10 MG/ML IJ SOLN
20.0000 mg | Freq: Once | INTRAMUSCULAR | Status: AC
  Administered 2016-06-28: 20 mg via INTRAVENOUS
  Filled 2016-06-28: qty 2

## 2016-06-28 MED ORDER — HYDRALAZINE HCL 20 MG/ML IJ SOLN
10.0000 mg | INTRAMUSCULAR | Status: DC | PRN
  Administered 2016-06-29: 10 mg via INTRAVENOUS
  Filled 2016-06-28: qty 1

## 2016-06-28 NOTE — Progress Notes (Signed)
Subjective: CC no complaint  Objective: Vital signs in last 24 hours: Temp:  [98.4 F (36.9 C)-100.5 F (38.1 C)] 100 F (37.8 C) (05/16 1928) Pulse Rate:  [59-112] 110 (05/17 0700) Resp:  [23-42] 27 (05/17 0700) BP: (141-170)/(82-112) 163/103 (05/17 0700) SpO2:  [93 %-98 %] 96 % (05/17 0700) Weight:  [79.6 kg (175 lb 7.8 oz)] 79.6 kg (175 lb 7.8 oz) (05/17 0500) Last BM Date: 06/26/16  Intake/Output from previous day: 05/16 0701 - 05/17 0700 In: 2882.7 [I.V.:592.7; NG/GT:1840; IV Piggyback:450] Out: 3600 [Urine:3600] Intake/Output this shift: No intake/output data recorded.  General appearance: cooperative Resp: clear to auscultation bilaterally Cardio: regular rate and rhythm GI: soft, NT, ND, +BS Extremities: calves soft  Neuro: awake and F/C, says some words Neck: collar in place Lab Results: CBC   Recent Labs  06/27/16 0246 06/28/16 0207  WBC 16.3* 16.1*  HGB 14.3 14.5  HCT 40.4 42.4  PLT 197 220   BMET  Recent Labs  06/27/16 0246 06/28/16 0207  NA 143 142  K 3.0* 3.4*  CL 115* 115*  CO2 22 20*  GLUCOSE 131* 116*  BUN 22* 23*  CREATININE 0.69 0.73  CALCIUM 7.8* 8.1*   PT/INR No results for input(s): LABPROT, INR in the last 72 hours. ABG No results for input(s): PHART, HCO3 in the last 72 hours.  Invalid input(s): PCO2, PO2  Studies/Results: Dg Chest Port 1 View  Result Date: 06/28/2016 CLINICAL DATA:  Follow-up right-sided infiltrate. EXAM: PORTABLE CHEST 1 VIEW COMPARISON:  Chest x-ray of Jun 27, 2016 and Jun 26, 2016 FINDINGS: There is persistent increased density throughout the right lung especially in its mid and lower portions. The left lung is well-expanded and clear. There is no mediastinal shift. The heart and pulmonary vascularity are normal. There is calcification in the wall of the aortic arch. The feeding tube tip projects below the inferior margin of the image. Multiple right-sided rib fractures are again demonstrated.  IMPRESSION: Persistent right-sided infiltrate compatible with pneumonia. Possible posterior layering of pleural fluid as well. No visible pneumothorax. When the patient can tolerate the procedure, a PA and lateral chest x-ray would be useful. Electronically Signed   By: Jadriel  [place] M.D.   On: 06/28/2016 07:24   Dg Chest Port 1 View  Result Date: 06/27/2016 CLINICAL DATA:  Respiratory failure, recent chest trauma, history of traumatic brain injury. EXAM: PORTABLE CHEST 1 VIEW COMPARISON:  Chest x-ray of Jun 26, 2016 and chest CT scan of Jun 21, 2016 FINDINGS: The lungs are well-expanded. Infiltrate throughout the right lung has increased. Minimal left basilar density persists. No definite pleural effusion is observed though there may be pleural fluid layering posteriorly on the right. There is no mediastinal shift. The heart and pulmonary vascularity are normal. There is calcification in the wall of the aortic arch. The feeding tube tip projects below the inferior margin of the image. IMPRESSION: Diffuse increased interstitial density throughout the right lung most compatible with known ammonia. Possible posterior layering of pleural effusion on the right. Asymmetric pulmonary edema is felt less likely. Minimal left basilar atelectasis is suspected. Thoracic aortic atherosclerosis. Electronically Signed   By: Tyus  [place] M.D.   On: 06/27/2016 07:28    Anti-infectives: Anti-infectives    Start     Dose/Rate Route Frequency Ordered Stop   06/25/16 1200  cefTRIAXone (ROCEPHIN) 2 g in dextrose 5 % 50 mL IVPB     2 g 100 mL/hr over 30 Minutes Intravenous Every 24 hours 06/25/16  1124        Assessment/Plan: Fall from ladder TBI w/ SAH and intraventricular hemorrhage  - EEG showed moderategeneralized slowing of brain activity and MRI showed traumatic parenchymal brain injury with multiple microhemorrhages - neurosurgery and neurology following, appreciate their recs - Amantadine per neurology -  TBI team therapies Right clavicle fracture - WBAT BLE, Sling for RUE when out of bed per Dr. Eulah PontMurphy  right posterior 2-6 rib fractures with contusion and tiny PTX - pulmonary toilet ID - Rocephin for possible PNA, resp CX pending Acute urinary retention - on urecholine, doing well with condom cath now HTN - adjusted PRN hydralazine R displaced comminuted sacrum fracture and R pubic rami fracture - WBAT per Dr. Eulah PontMurphy C collar in place - CT scan cervical showed no abnormalities, re-examine vs flex ex once more alert FEN: replace hypokalemia, lasix x 1 VTE: SCD's, no anticoagulation or antiplatelet 2-3 weeks per neurology Dispo - SDU, CIR following I spoke with his wife at the bedside.   LOS: 7 days    Violeta GelinasBurke Renato Spellman, MD, MPH, FACS Trauma: [phone] General Surgery: [phone]  5/17/2018Patient ID: Jannet Askewavid Demaree, male   DOB: Sep 18, 1950, 66 y.o.   MRN: [id number]

## 2016-06-28 NOTE — Progress Notes (Signed)
Physical Therapy Treatment Patient Details Name: Tyler Lane MRN: [id number] DOB: 02-19-1950 Today's Date: 06/28/2016    History of Present Illness 66 y.o. male admitted after fall from 10 foot ladder, suffering TBI, SAH, right clavicle fracture, right superior rami fracture. MR Brain revealed TBI with multiple microhemorrhages of right frontal lobe, left occipital lobe and right thalamus. 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     PT Comments    Pt, when aroused and briefly attending to task presents as at least a Rancho V.  He seemed to do mildly better today re: attention and participation in self feeding (see SLP note).  He continues to have difficulty during transitions (bed mobility and transfers) likely due to pain and guarding.  Paged MD to get a R arm sling ordered and clarification on R UE WB status (presumed NWB for now).  PT will continue to follow acutely.  Daughters in room during session.    Follow Up Recommendations  CIR     Equipment Recommendations  Wheelchair (measurements PT);Wheelchair cushion (measurements PT);Hospital bed    Recommendations for Other Services   NA     Precautions / Restrictions Precautions Precautions: Fall;Cervical Precaution Comments: R shoulder sing mentioned in PN, no orders, so called MD to get order and clarify WB status.  Required Braces or Orthoses: Cervical Brace;Other Brace/Splint Cervical Brace: Hard collar;At all times Other Brace/Splint: has cortrax feeding tube Restrictions RLE Weight Bearing: Weight bearing as tolerated LLE Weight Bearing: Weight bearing as tolerated    Mobility  Bed Mobility Overal bed mobility: Needs Assistance Bed Mobility: Rolling;Supine to Sit;Sit to Supine Rolling: +2 for physical assistance;Total assist   Supine to sit: +2 for physical assistance;Total assist Sit to supine: +2 for physical  assistance;Max assist   General bed mobility comments: Pt resistant to motion towards EOB likely due to pain, keeping body rigid and in extension until we got positioned in sitting with feet on the ground and L UE proped.  He seemed to want to initiate move back to supine, but then realized pain started with the transition and started resisting the movement back down.  Pt very resistive to rolling L to position bedpan for BM (he was able to verbalize that he needed to have a BM prior to lying down).    Transfers                 General transfer comment: NT today due to fatigue and focus on coughing and eating EOB.          Balance Overall balance assessment: Needs assistance Sitting-balance support: Feet supported;Single extremity supported Sitting balance-Leahy Scale: Poor Sitting balance - Comments: Varies from initial sitting max to min guard and close supervision once positioned.  He did fatigue after ~15 mins EOB.  Initially pt wanted to WB through both arms, but we do not officially have a WB status for R arm due to clavicle fx, so kept this arm in his lap.   Postural control: Left lateral lean                                  Cognition Arousal/Alertness: Lethargic Behavior During Therapy: Restless;Impulsive Overall Cognitive Status: Impaired/Different from baseline Area of Impairment: Attention               [place]ancho Levels of Cognitive Functioning [place] [id number]  Scales of Cognitive Functioning: Confused/inappropriate/non-agitated (when aroused he presents more like a V)   Current Attention Level: Focused   Following Commands: Follows one step commands inconsistently       General Comments: Pt when aroused and breifly focused, can answer some basic questions.  He had breif periods of attention to task with SLP and some self feeding, but does need frequent verbal and tactile input to arouse and focus.  During these periods he is fairly accurate  with his responses and follows some simple commands.           General Comments General comments (skin integrity, edema, etc.): Vitals stable throughout session on RA, initially when seated EOB RR 32, but calmed with support and increased time. O2 sats stayed >90.  BP within physician prescribed limitations.        Pertinent Vitals/Pain Pain Assessment: Faces Faces Pain Scale: Hurts even more Pain Location: presumed ribs, pelvis clavicle, pt guarded with transitions and grimacing during coughing.  Pain Descriptors / Indicators: Grimacing;Guarding Pain Intervention(s): Limited activity within patient's tolerance;Monitored during session;Repositioned           PT Goals (current goals can now be found in the care plan section) Acute Rehab PT Goals Patient Stated Goal: none stated Progress towards PT goals: Progressing toward goals    Frequency    Min 3X/week      PT Plan Current plan remains appropriate    Co-evaluation PT/OT/SLP Co-Evaluation/Treatment: Yes Reason for Co-Treatment: Complexity of the patient's impairments (multi-system involvement);Necessary to address cognition/behavior during functional activity;For patient/therapist safety;To address functional/ADL transfers PT goals addressed during session: Mobility/safety with mobility;Balance   SLP goals addressed during session: Swallowing;Cognition    AM-PAC PT "6 Clicks" Daily Activity  Outcome Measure  Difficulty turning over in bed (including adjusting bedclothes, sheets and blankets)?: Total Difficulty moving from lying on back to sitting on the side of the bed? : Total Difficulty sitting down on and standing up from a chair with arms (e.g., wheelchair, bedside commode, etc,.)?: Total Help needed moving to and from a bed to chair (including a wheelchair)?: Total Help needed walking in hospital room?: Total Help needed climbing 3-5 steps with a railing? : Total 6 Click Score: 6    End of Session Equipment  Utilized During Treatment: Cervical collar Activity Tolerance: Patient limited by fatigue Patient left: in bed;with call bell/phone within reach;with family/visitor present Nurse Communication: Mobility status;Other (comment) (on bedpan when we left) PT Visit Diagnosis: Difficulty in walking, not elsewhere classified (R26.2);Other symptoms and signs involving the nervous system (R29.898);Pain Pain - part of body: Shoulder (R shoulder, pelvis, ribs)     Time: [id number] PT Time Calculation (min) (ACUTE ONLY): 32 min  Charges:  $Therapeutic Activity: 8-22 mins          Leatrice Parilla B. Jamela Cumbo, PT, DPT [phone]            06/28/2016, 2:42 PM

## 2016-06-28 NOTE — Progress Notes (Addendum)
  Speech Language Pathology Treatment: Dysphagia;Cognitive-Linquistic  Patient Details Name: Tyler AskewDavid Lane MRN: [id number] DOB: 20-May-1950 Today's Date: 06/28/2016 Time: [id number] SLP Time Calculation (min) (ACUTE ONLY): 23 min  Assessment / Plan / Recommendation Clinical Impression  Pt remains most consistent with a Rancho level V (confused, inappropriate), as his performance is impacted by lethargy and fleeting attention. He showed appropriate orientation to his name and his birthday but provided an incorrect birth year. He also had improving attempts to follow one-step commands and brief sustained attention during self-feeding given Mod cues. Pt attempted coughing to command although it is generally weak and guarded. His RR was elevated at baseline with audible congestion. While no overt s/s of aspiration were observed, given the above and his reduced sustained attention orally, recommend that he remain NPO. Pt verbalized his need to have a bowel movement and was placed on the bedpan upon completion of session.    HPI HPI: Patient is a 66 y.o. male admitted after fall from 10 foot ladder, suffering TBI, SAH, right clavicle fracture, right superior rami fracture. MR Brain revealed TBI with multiple microhemorrhages of right frontal lobe, left occipital lobe and right thalamus. MD also reported "widespread axonal injury and moderate cortical dysfunction."      SLP Plan  Continue with current plan of care       Recommendations  Diet recommendations: NPO Medication Administration: Via alternative means                Oral Care Recommendations: Oral care BID Follow up Recommendations: 24 hour supervision/assistance;Inpatient Rehab SLP Visit Diagnosis: Dysphagia, unspecified (R13.10);Cognitive communication deficit (Y78.295(R41.841) Plan: Continue with current plan of care       GO                Maxcine Hamaiewonsky, [name] 06/28/2016, 2:40 PM  Maxcine HamLaura Paiewonsky, M.A.  CCC-SLP [phone]

## 2016-06-28 NOTE — Progress Notes (Signed)
Rehab admissions - I met with patient and his 2 daughters.  Patient lives with significant other.  Dtr and SO plan to care for patient after a potential acute inpatient rehab stay.  I have called and started the preauthorization process with [facility] insurance carrier.  I will follow up once I hear back from insurance case manager.  Call me for questions.  #[phone]

## 2016-06-28 NOTE — Progress Notes (Signed)
Pt seen and examined.  No issues overnight. Denies any compaints  EXAM: Temp:  [99.8 F (37.7 C)-100.5 F (38.1 C)] 99.8 F (37.7 C) (05/17 1227) Pulse Rate:  [59-112] 90 (05/17 1300) Resp:  [19-42] 25 (05/17 1300) BP: (135-174)/(82-112) 144/101 (05/17 1300) SpO2:  [93 %-98 %] 94 % (05/17 1300) Weight:  [79.6 kg (175 lb 7.8 oz)] 79.6 kg (175 lb 7.8 oz) (05/17 0500) Intake/Output      05/16 0701 - 05/17 0700 05/17 0701 - 05/18 0700   I.V. (mL/kg) 592.7 (7.4) 120 (1.5)   NG/GT 1840 360   IV Piggyback 450 400   Total Intake(mL/kg) 2882.7 (36.2) 880 (11.1)   Urine (mL/kg/hr) 3600 (1.9) 1650 (3.2)   Stool 0 (0) 0 (0)   Total Output 3600 1650   Net -717.3 -770        Stool Occurrence 3 x 2 x    Easily arousable Answers simple questions CN grossly intact MAE  Stable Continue current care

## 2016-06-28 NOTE — Progress Notes (Signed)
Neurology Progress Note  Subjective: No major overnight events reported. He continues to show fluctuations in his level of alertness. He was more alert this morning but on my visit was sleeping. He would rouse to voice and to tactile stimulation but would go right back to sleep without persistent stimulation. This limits the exam. ROS cannot be obtained.    Medications reviewed and reconciled.   Pertinent meds: Amantadine 100 mg twice daily Nicotine patch 21 mg daily Bethanechol 25 mg TID Ceftriaxone 2 g q24h  Current Meds:   Current Facility-Administered Medications:  .  0.9 %  sodium chloride infusion, , Intravenous, Continuous, Jimmye Norman, MD, Last Rate: 20 mL/hr at 06/28/16 0809 .  acetaminophen (TYLENOL) solution 650 mg, 650 mg, Per Tube, Q6H PRN, Violeta Gelinas, MD, 650 mg at 06/28/16 4098 .  acetaminophen (TYLENOL) suppository 650 mg, 650 mg, Rectal, Q4H PRN, Barnett Abu, MD, 650 mg at 06/24/16 2358 .  amantadine (SYMMETREL) 50 MG/5ML solution 100 mg, 100 mg, Per Tube, BID, Jimmye Norman, MD, 100 mg at 06/28/16 0921 .  bethanechol (URECHOLINE) tablet 25 mg, 25 mg, Per Tube, TID, Violeta Gelinas, MD, 25 mg at 06/28/16 1705 .  cefTRIAXone (ROCEPHIN) 2 g in dextrose 5 % 50 mL IVPB, 2 g, Intravenous, Q24H, Focht, Jessica L, PA, Stopped at 06/28/16 1452 .  chlorhexidine (PERIDEX) 0.12 % solution 15 mL, 15 mL, Mouth Rinse, BID, Abigail Miyamoto, MD, 15 mL at 06/28/16 0921 .  feeding supplement (PIVOT 1.5 CAL) liquid 1,000 mL, 1,000 mL, Per Tube, Q24H, Jimmye Norman, MD, Last Rate: 60 mL/hr at 06/28/16 0700, 1,000 mL at 06/28/16 0700 .  free water 200 mL, 200 mL, Per Tube, Q8H, Jimmye Norman, MD, 200 mL at 06/28/16 1314 .  hydrALAZINE (APRESOLINE) injection 10 mg, 10 mg, Intravenous, Q4H PRN, Violeta Gelinas, MD .  HYDROcodone-acetaminophen (HYCET) 7.5-325 mg/15 ml solution 15 mL, 15 mL, Per Tube, Q4H PRN, Violeta Gelinas, MD, 15 mL at 06/28/16 0408 .  LORazepam (ATIVAN) injection 0.5-1  mg, 0.5-1 mg, Intravenous, Q4H PRN, Barnett Abu, MD, 1 mg at 06/26/16 2234 .  MEDLINE mouth rinse, 15 mL, Mouth Rinse, q12n4p, Abigail Miyamoto, MD, 15 mL at 06/28/16 1659 .  morphine 4 MG/ML injection 2-4 mg, 2-4 mg, Intravenous, Q4H PRN, Jimmye Norman, MD, 4 mg at 06/28/16 1191 .  nicotine (NICODERM CQ - dosed in mg/24 hours) patch 21 mg, 21 mg, Transdermal, Daily, Jimmye Norman, MD, 21 mg at 06/28/16 0941 .  ondansetron (ZOFRAN) tablet 4 mg, 4 mg, Oral, Q6H PRN **OR** ondansetron (ZOFRAN) injection 4 mg, 4 mg, Intravenous, Q6H PRN, Abigail Miyamoto, MD .  potassium chloride SA (K-DUR,KLOR-CON) CR tablet 20 mEq, 20 mEq, Oral, TID, Jimmye Norman, MD, 20 mEq at 06/28/16 1705  Objective:  Temp:  [99.8 F (37.7 C)-100.1 F (37.8 C)] 100 F (37.8 C) (05/17 1600) Pulse Rate:  [59-112] 101 (05/17 1900) Resp:  [19-36] 23 (05/17 1900) BP: (135-174)/(82-112) 167/88 (05/17 1900) SpO2:  [93 %-99 %] 98 % (05/17 1900) Weight:  [79.6 kg (175 lb 7.8 oz)] 79.6 kg (175 lb 7.8 oz) (05/17 0500)  General: WDWN Caucasian man lying in bed. He opens his eyes to voice. He is able to answer some yes/no questions but is sleepy so does not participate well with the exam. He followed limited commands.  HEENT: Cervical collar in place. Sclerae are anicteric. He has mild conjunctival injection bilaterally.   CV: Regular, no murmur. Carotid pulses are 2+ and symmetric with no bruits. Distal pulses 2+  and symmetric.  Lungs: CTAB on anterior exam. Extremities: No C/C/E. Neuro: MS: As noted above.  CN: Pupils are equal and reactive from 3-->2 mm bilaterally. His eyes are conjugate. Corneals are intact. His face is symmetric. The remainder of his cranial nerve exam is limited by poor cooperation.  Motor: Normal bulk, tone. He moves all four extremities with grossly normal strength. He does not participate with confrontational testing. No tremor or other abnormal movements are observed.  Sensation: He withdraws  purposefully from minimal noxious stimuli 4.  DTRs: 3 +, symmetric. He has sustained clonus at both ankles. Crossed adductors are present bilaterally. Toes are upgoing on the L, downgoing on the R. Hoffmann's reflexes present bilaterally.  Coordination:Unable to test as he does not participate with the exam.   Labs: Lab Results  Component Value Date   WBC 16.1 (H) 06/28/2016   HGB 14.5 06/28/2016   HCT 42.4 06/28/2016   PLT 220 06/28/2016   GLUCOSE 116 (H) 06/28/2016   ALT 59 06/21/2016   AST 66 (H) 06/21/2016   NA 142 06/28/2016   K 3.4 (L) 06/28/2016   CL 115 (H) 06/28/2016   CREATININE 0.73 06/28/2016   BUN 23 (H) 06/28/2016   CO2 20 (L) 06/28/2016   INR 1.03 06/21/2016   CBC Latest Ref Rng & Units 06/28/2016 06/27/2016 06/26/2016  WBC 4.0 - 10.5 K/uL 16.1(H) 16.3(H) 14.2(H)  Hemoglobin 13.0 - 17.0 g/dL 16.114.5 09.614.3 04.514.2  Hematocrit 39.0 - 52.0 % 42.4 40.4 40.9  Platelets 150 - 400 K/uL 220 197 180    No results found for: HGBA1C Lab Results  Component Value Date   ALT 59 06/21/2016   AST 66 (H) 06/21/2016   ALKPHOS 89 06/21/2016   BILITOT 1.1 06/21/2016     Radiology:  There is no new neuro imaging for review.  Other diagnostic studies:  EEG from 06/22/16 was interpreted as showing moderate generalized slowing without epileptiform discharges or seizures. The back and frequencies are noted to be 5-6 hertz.  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 This has resulted in diffuse axonal injury and subarachnoid hemorrhage. Continue supportive care. Avoid hypoxemia and hypotension.    2. Subarachnoid hemorrhage: This is acute, due to trauma. Treatment is supportive. Watch BP, avoid SBP >160. Antiplatelet agents and nonsteroidal anti-inflammatories are best avoided for 7-10 days following an acute traumatic hemorrhage. Therapeutic systemic anticoagulation is best deferred for 3-4 weeks.   3. 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 Treatment is supportive. Avoid antiplatelet agents, nonsteroidal anti-inflammatories, and therapeutic systemic anticoagulation as noted above.   4. Acute encephalopathy: This is due to traumatic brain injury. He continue to fluctuate but overall has been showing some slow improvement. Continue to optimize metabolic status as you are. Minimize CNS active medications, particularly opiates, benzodiazepines, and anything with strong anticholinergic properties. Continue amantadine. Cognitive rehab as able.   This was discussed with this two daughters at the bedside. Education was provided on his TBI and expectations moving forward. Both of them work with TBI patients and are somewhat familiar with the course ahead. They were given the chance to ask any questions and these were addressed to their satisfaction.   Neurology will continue to follow. Please call with any questions or concerns.  Rhona Leavensimothy Dalilah Curlin, MD [facility]

## 2016-06-28 NOTE — Progress Notes (Signed)
Occupational Therapy Treatment Patient Details Name: Tyler Lane MRN: [id number] DOB: 1951-01-31 Today's Date: 06/28/2016    History of present illness 66 y.o. male admitted after fall from 10 foot ladder, suffering TBI, SAH, right clavicle fracture, right superior rami fracture. MR Brain revealed TBI with multiple microhemorrhages of right frontal lobe, left occipital lobe and right thalamus. 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    OT comments  Pt continues to require max cues to arouse and attend to tasks, but when aroused, able to sustain attention for brief periods.  He follows one step commands inconsistently.  He maintained EOB sitting with min - mod A today.  He demonstrates behaviors consistent with [place] Level V.  Will continue to follow.  Recommend CIR.   Follow Up Recommendations  CIR;Supervision/Assistance - 24 hour    Equipment Recommendations  None recommended by OT    Recommendations for Other Services Rehab consult    Precautions / Restrictions Precautions Precautions: Fall;Cervical Precaution Comments: R shoulder sing mentioned in PN, no orders, so called MD to get order and clarify WB status.  Required Braces or Orthoses: Cervical Brace;Other Brace/Splint Cervical Brace: Hard collar;At all times Other Brace/Splint: has cortrax feeding tube Restrictions RLE Weight Bearing: Weight bearing as tolerated LLE Weight Bearing: Weight bearing as tolerated       Mobility Bed Mobility Overal bed mobility: Needs Assistance Bed Mobility: Rolling;Supine to Sit;Sit to Supine Rolling: +2 for physical assistance;Total assist   Supine to sit: +2 for physical assistance;Total assist Sit to supine: +2 for physical assistance;Max assist   General bed mobility comments: Pt resistant to motion towards EOB likely due to pain, keeping body rigid and in extension until we got  positioned in sitting with feet on the ground and L UE proped.  He seemed to want to initiate move back to supine, but then realized pain started with the transition and started resisting the movement back down.  Pt very resistive to rolling L to position bedpan for BM (he was able to verbalize that he needed to have a BM prior to lying down).    Transfers                 General transfer comment: NT today due to fatigue and focus on coughing and eating EOB.     Balance Overall balance assessment: Needs assistance Sitting-balance support: Feet supported;Single extremity supported Sitting balance-Leahy Scale: Poor Sitting balance - Comments: Varies from initial sitting max to min guard and close supervision once positioned.  He did fatigue after ~15 mins EOB.  Initially pt wanted to WB through both arms, but we do not officially have a WB status for R arm due to clavicle fx, so kept this arm in his lap.   Postural control: Left lateral lean                                 ADL either performed or assessed with clinical judgement   ADL Overall ADL's : Needs assistance/impaired Eating/Feeding: Maximal assistance Eating/Feeding Details (indicate cue type and reason): Pt trialed ice chips with SLP.  Pt able to hold spoon in Lt hand with min A and required mod a to move spoon to mouth for 4-5 single bites with prompting  Vision   Additional Comments: Pt opens eyes with prompted, but unable to sustain attention for formal assessment    Perception     Praxis      Cognition Arousal/Alertness: Lethargic Behavior During Therapy: Restless;Impulsive Overall Cognitive Status: Impaired/Different from baseline Area of Impairment: Attention               Rancho Levels of Cognitive Functioning [place] Scales of Cognitive Functioning: Confused/inappropriate/non-agitated (when aroused he presents more like a  V)   Current Attention Level: Focused   Following Commands: Follows one step commands inconsistently       General Comments: Pt when aroused and breifly focused, can answer some basic questions.  He had breif periods of attention to task with SLP and some self feeding, but does need frequent verbal and tactile input to arouse and focus.  During these periods he is fairly accurate with his responses and follows some simple commands.          Exercises     Shoulder Instructions       General Comments vital stable     Pertinent Vitals/ Pain       Pain Assessment: Faces Faces Pain Scale: Hurts even more Pain Location: presumed ribs, pelvis clavicle, pt guarded with transitions and grimacing during coughing.  Pain Descriptors / Indicators: Grimacing;Guarding Pain Intervention(s): Limited activity within patient's tolerance  Home Living                                          Prior Functioning/Environment              Frequency  Min 2X/week        Progress Toward Goals  OT Goals(current goals can now be found in the care plan section)  Progress towards OT goals: Progressing toward goals  Acute Rehab OT Goals Patient Stated Goal: none stated  Plan Discharge plan remains appropriate    Co-evaluation    PT/OT/SLP Co-Evaluation/Treatment: Yes Reason for Co-Treatment: Complexity of the patient's impairments (multi-system involvement);Necessary to address cognition/behavior during functional activity;For patient/therapist safety;To address functional/ADL transfers PT goals addressed during session: Mobility/safety with mobility;Balance OT goals addressed during session: ADL's and self-care SLP goals addressed during session: Swallowing;Cognition    AM-PAC PT "6 Clicks" Daily Activity     Outcome Measure   Help from another person eating meals?: Total Help from another person taking care of personal grooming?: Total Help from another person  toileting, which includes using toliet, bedpan, or urinal?: Total Help from another person bathing (including washing, rinsing, drying)?: Total Help from another person to put on and taking off regular upper body clothing?: Total Help from another person to put on and taking off regular lower body clothing?: Total 6 Click Score: 6    End of Session Equipment Utilized During Treatment: Oxygen;Cervical collar  OT Visit Diagnosis: Pain;Cognitive communication deficit (R41.841) Pain - Right/Left: Right Pain - part of body: Shoulder   Activity Tolerance Patient tolerated treatment well   Patient Left in bed;with family/visitor present;with call bell/phone within reach   Nurse Communication Mobility status        Time: [id number] OT Time Calculation (min): 31 min  Charges: OT General Charges $OT Visit: 1 Procedure OT Treatments $Therapeutic Activity: 8-22 mins  Reynolds AmericanWendi Harel Repetto, OTR/L [phone]    Jeani HawkingConarpe, Tyler Lane 06/28/2016, 3:56 PM

## 2016-06-28 NOTE — Progress Notes (Signed)
Orthopedic Tech Progress Note Patient Details:  Tyler AskewDavid Kinner Apr 18, 1950 [id number]  Ortho Devices Type of Ortho Device: Arm sling Ortho Device/Splint Location: rue Ortho Device/Splint Interventions: Application   Nikki DomCrawford, Viana Sleep 06/28/2016, 3:50 PM

## 2016-06-29 ENCOUNTER — Inpatient Hospital Stay (HOSPITAL_COMMUNITY): Payer: Medicare HMO

## 2016-06-29 LAB — CBC
HCT: 43.6 % (ref 39.0–52.0)
HEMOGLOBIN: 15 g/dL (ref 13.0–17.0)
MCH: 31.6 pg (ref 26.0–34.0)
MCHC: 34.4 g/dL (ref 30.0–36.0)
MCV: 92 fL (ref 78.0–100.0)
Platelets: 255 10*3/uL (ref 150–400)
RBC: 4.74 MIL/uL (ref 4.22–5.81)
RDW: 14.6 % (ref 11.5–15.5)
WBC: 18 10*3/uL — AB (ref 4.0–10.5)

## 2016-06-29 LAB — BASIC METABOLIC PANEL
ANION GAP: 9 (ref 5–15)
BUN: 27 mg/dL — ABNORMAL HIGH (ref 6–20)
CO2: 19 mmol/L — AB (ref 22–32)
Calcium: 8.4 mg/dL — ABNORMAL LOW (ref 8.9–10.3)
Chloride: 115 mmol/L — ABNORMAL HIGH (ref 101–111)
Creatinine, Ser: 0.76 mg/dL (ref 0.61–1.24)
GFR calc Af Amer: >60 mL/min (ref >60)
GFR calc non Af Amer: >60 mL/min (ref >60)
GLUCOSE: 99 mg/dL (ref 65–99)
POTASSIUM: 4.1 mmol/L (ref 3.5–5.1)
Sodium: 143 mmol/L (ref 135–145)

## 2016-06-29 LAB — CULTURE, RESPIRATORY W GRAM STAIN

## 2016-06-29 LAB — GLUCOSE, CAPILLARY
GLUCOSE-CAPILLARY: 99 mg/dL (ref 65–99)
GLUCOSE-CAPILLARY: 124 mg/dL — AB (ref 65–99)
Glucose-Capillary: 117 mg/dL — ABNORMAL HIGH (ref 65–99)
Glucose-Capillary: 129 mg/dL — ABNORMAL HIGH (ref 65–99)
Glucose-Capillary: 132 mg/dL — ABNORMAL HIGH (ref 65–99)
Glucose-Capillary: 139 mg/dL — ABNORMAL HIGH (ref 65–99)

## 2016-06-29 LAB — CULTURE, RESPIRATORY

## 2016-06-29 MED ORDER — ORAL CARE MOUTH RINSE: 15.0000 mL | Freq: Two times a day (BID) | OROMUCOSAL | Status: DC

## 2016-06-29 MED ORDER — ASPIRIN 81 MG PO CHEW
81.0000 mg | CHEWABLE_TABLET | Freq: Every day | ORAL | Status: DC
  Administered 2016-06-29 – 2016-07-02 (×4): 81 mg
  Filled 2016-06-29 (×4): qty 1

## 2016-06-29 MED ORDER — PRAVASTATIN SODIUM 40 MG PO TABS
40.0000 mg | ORAL_TABLET | Freq: Every day | ORAL | Status: DC
  Administered 2016-06-29 – 2016-07-01 (×3): 40 mg via ORAL
  Filled 2016-06-29 (×3): qty 1

## 2016-06-29 MED ORDER — CHLORHEXIDINE GLUCONATE 0.12 % MT SOLN: 15.0000 mL | Freq: Two times a day (BID) | OROMUCOSAL | Status: DC

## 2016-06-29 MED ORDER — POTASSIUM CHLORIDE 20 MEQ/15ML (10%) PO SOLN
20.0000 meq | Freq: Three times a day (TID) | ORAL | Status: DC
  Administered 2016-06-29 – 2016-07-01 (×7): 20 meq via ORAL
  Filled 2016-06-29 (×7): qty 15

## 2016-06-29 MED ORDER — ASPIRIN EC 81 MG PO TBEC: 81.0000 mg | DELAYED_RELEASE_TABLET | Freq: Every day | ORAL | Status: DC

## 2016-06-29 NOTE — Progress Notes (Signed)
Trauma Service Note  Subjective: Patient is much more responsive and coherent this morning.  Agitated but in no acute distress  Objective: Vital signs in last 24 hours: Temp:  [99.8 F (37.7 C)-101.1 F (38.4 C)] 100.2 F (37.9 C) (05/18 0400) Pulse Rate:  [66-110] 92 (05/18 0800) Resp:  [19-30] 22 (05/18 0800) BP: (135-181)/(88-107) 143/96 (05/18 0800) SpO2:  [94 %-100 %] 99 % (05/18 0800) Weight:  [77.4 kg (170 lb 10.2 oz)] 77.4 kg (170 lb 10.2 oz) (05/18 0500) Last BM Date: 06/29/16  Intake/Output from previous day: 05/17 0701 - 05/18 0700 In: 2370 [I.V.:480; NG/GT:1440; IV Piggyback:450] Out: 3000 [Urine:3000] Intake/Output this shift: Total I/O In: 80 [I.V.:20; NG/GT:60] Out: 200 [Urine:200]  General: Moderately agitated.  Lungs: Clear to auscultation.  Abd: Benign.  Tolerating tube feedings and had bowel movement.  Extremities: No changes  Neuro: GCS14  Lab Results: CBC   Recent Labs  06/28/16 0207 06/29/16 0326  WBC 16.1* 18.0*  HGB 14.5 15.0  HCT 42.4 43.6  PLT 220 255   BMET  Recent Labs  06/28/16 0207 06/29/16 0326  NA 142 143  K 3.4* 4.1  CL 115* 115*  CO2 20* 19*  GLUCOSE 116* 99  BUN 23* 27*  CREATININE 0.73 0.76  CALCIUM 8.1* 8.4*   PT/INR No results for input(s): LABPROT, INR in the last 72 hours. ABG No results for input(s): PHART, HCO3 in the last 72 hours.  Invalid input(s): PCO2, PO2  Studies/Results: Dg Chest 2 View  Result Date: 06/29/2016 CLINICAL DATA:  Respiratory failure. EXAM: CHEST  2 VIEW COMPARISON:  06/28/2016.  06/27/2016 . FINDINGS: Feeding tube in stable position. Heart size normal. Persistent but improved diffuse right lung infiltrate. Mild left basilar atelectasis/infiltrate remains. Multiple right rib fractures and right clavicular fracture scratch again noted. IMPRESSION: 1.  Feeding tube in stable position. 2. Interim improvement of diffuse right lung infiltrate. Mild atelectasis/infiltrate left base again  noted. Small right pleural effusion. 3. Multiple right rib fractures and right clavicular fracture again noted . No pneumothorax. Electronically Signed   By: Maisie Fus  Register   On: 06/29/2016 07:16   Dg Chest Port 1 View  Result Date: 06/28/2016 CLINICAL DATA:  Follow-up right-sided infiltrate. EXAM: PORTABLE CHEST 1 VIEW COMPARISON:  Chest x-ray of Jun 27, 2016 and Jun 26, 2016 FINDINGS: There is persistent increased density throughout the right lung especially in its mid and lower portions. The left lung is well-expanded and clear. There is no mediastinal shift. The heart and pulmonary vascularity are normal. There is calcification in the wall of the aortic arch. The feeding tube tip projects below the inferior margin of the image. Multiple right-sided rib fractures are again demonstrated. IMPRESSION: Persistent right-sided infiltrate compatible with pneumonia. Possible posterior layering of pleural fluid as well. No visible pneumothorax. When the patient can tolerate the procedure, a PA and lateral chest x-ray would be useful. Electronically Signed   By: Aysen  [place] M.D.   On: 06/28/2016 07:24    Anti-infectives: Anti-infectives    Start     Dose/Rate Route Frequency Ordered Stop   06/25/16 1200  cefTRIAXone (ROCEPHIN) 2 g in dextrose 5 % 50 mL IVPB     2 g 100 mL/hr over 30 Minutes Intravenous Every 24 hours 06/25/16 1124        Assessment/Plan: s/p  Swallowing evaluation.  Transfer to SDU/4E CPM otherwise.  If he passed swallowing evaluation, remove FT and start recommended diet.  LOS: 8 days   Marta Lamas.  Gae BonWyatt, III, MD, FACS [phone] Trauma Surgeon 06/29/2016

## 2016-06-29 NOTE — Progress Notes (Signed)
Neurology Progress Note  Subjective: No major overnight events reported. He has been much more alert again today. He follows commands and will answer some questions appropriately. However, he remains confused and continues to have some fluctuations in his level of arousal. Presently, he is not reporting any headache, neck pain, or back pain. Review of systems is unremarkable.   Pertinent meds: Amantadine 100 mg twice daily Nicotine patch 21 mg daily Bethanechol 25 mg TID Ceftriaxone 2 g q24h  Current Meds:   Current Facility-Administered Medications:  .  0.9 %  sodium chloride infusion, , Intravenous, Continuous, Jimmye Norman, MD, Last Rate: 20 mL/hr at 06/29/16 1713 .  acetaminophen (TYLENOL) solution 650 mg, 650 mg, Per Tube, Q6H PRN, Violeta Gelinas, MD, 650 mg at 06/29/16 0033 .  acetaminophen (TYLENOL) suppository 650 mg, 650 mg, Rectal, Q4H PRN, Barnett Abu, MD, 650 mg at 06/24/16 2358 .  amantadine (SYMMETREL) 50 MG/5ML solution 100 mg, 100 mg, Per Tube, BID, Jimmye Norman, MD, 100 mg at 06/29/16 0901 .  aspirin chewable tablet 81 mg, 81 mg, Per Tube, Daily, Jimmye Norman, MD .  bethanechol (URECHOLINE) tablet 25 mg, 25 mg, Per Tube, TID, Violeta Gelinas, MD, 25 mg at 06/29/16 1507 .  cefTRIAXone (ROCEPHIN) 2 g in dextrose 5 % 50 mL IVPB, 2 g, Intravenous, Q24H, Focht, Jessica L, PA, Stopped at 06/29/16 1144 .  chlorhexidine (PERIDEX) 0.12 % solution 15 mL, 15 mL, Mouth Rinse, BID, Abigail Miyamoto, MD, 15 mL at 06/29/16 0901 .  feeding supplement (PIVOT 1.5 CAL) liquid 1,000 mL, 1,000 mL, Per Tube, Q24H, Jimmye Norman, MD, Last Rate: 60 mL/hr at 06/29/16 1507, 1,000 mL at 06/29/16 1507 .  free water 200 mL, 200 mL, Per Tube, Q8H, Jimmye Norman, MD, 200 mL at 06/29/16 1400 .  hydrALAZINE (APRESOLINE) injection 10 mg, 10 mg, Intravenous, Q4H PRN, Violeta Gelinas, MD, 10 mg at 06/29/16 1019 .  HYDROcodone-acetaminophen (HYCET) 7.5-325 mg/15 ml solution 15 mL, 15 mL, Per Tube, Q4H PRN,  Violeta Gelinas, MD, 15 mL at 06/29/16 1319 .  LORazepam (ATIVAN) injection 0.5-1 mg, 0.5-1 mg, Intravenous, Q4H PRN, Barnett Abu, MD, 1 mg at 06/26/16 2234 .  MEDLINE mouth rinse, 15 mL, Mouth Rinse, q12n4p, Abigail Miyamoto, MD, 15 mL at 06/29/16 1508 .  morphine 4 MG/ML injection 2-4 mg, 2-4 mg, Intravenous, Q4H PRN, Jimmye Norman, MD, 4 mg at 06/29/16 0151 .  nicotine (NICODERM CQ - dosed in mg/24 hours) patch 21 mg, 21 mg, Transdermal, Daily, Jimmye Norman, MD, 21 mg at 06/29/16 0901 .  ondansetron (ZOFRAN) tablet 4 mg, 4 mg, Oral, Q6H PRN **OR** ondansetron (ZOFRAN) injection 4 mg, 4 mg, Intravenous, Q6H PRN, Abigail Miyamoto, MD .  potassium chloride 20 MEQ/15ML (10%) solution 20 mEq, 20 mEq, Oral, TID, Jimmye Norman, MD, 20 mEq at 06/29/16 1507 .  pravastatin (PRAVACHOL) tablet 40 mg, 40 mg, Oral, q1800, Jimmye Norman, MD  Objective:  Temp:  [98.8 F (37.1 C)-101.1 F (38.4 C)] 99.2 F (37.3 C) (05/18 1700) Pulse Rate:  [66-103] 95 (05/18 1700) Resp:  [20-29] 22 (05/18 1700) BP: (132-181)/(88-111) 155/94 (05/18 1700) SpO2:  [97 %-100 %] 99 % (05/18 1700) Weight:  [77.4 kg (170 lb 10.2 oz)] 77.4 kg (170 lb 10.2 oz) (05/18 0500)  General: WDWN Caucasian man lying in bed. He opens his eyes to voice. He will follow midline and appendicular commands, sometimes with mild delay. He is oriented to self and [facility]. He answers yes/no questions with fair accuracy. Sustain attention is  impaired. HEENT: Cervical collar in place. Sclerae are anicteric. He has mild conjunctival injection bilaterally.  Mucous membranes appear dry. CV: Regular, no murmur. Carotid pulses are 2+ and symmetric with no bruits. Distal pulses 2+ and symmetric.  Lungs: CTAB on anterior exam. Extremities: No C/C/E. Neuro: MS: As noted above.  CN: Pupils are difficult to see because he resists passive eye opening on both sides. They are briefly visualized and appear to be symmetric. Reactivity could not be assessed,  however. His eyes appear to be conjugate. Corneals are intact and symmetric. His face appears to be symmetric with normal strength. Eye closure is very strong and symmetric. Hearing appears to be intact to conversational voice. The remainder of cranial nerve examination is limited as he does not fully cooperate and because he is in a cervical collar.  Motor: Normal bulk, tone. He moves all four extremities with grossly normal strength. He is able to participate with confrontational testing to a limited extent. The muscles tested are normal with 5/5 strength. No tremor or other abnormal movements are observed.  Sensation: He withdraws purposefully from minimal noxious stimuli 4.  DTRs: 3 +, symmetric. He has a few beats of clonus at the right ankle, no real clonus at the left ankle today. Crossed adductors are present bilaterally. Toes are upgoing bilaterally Hoffmann's reflexes present bilaterally.  Coordination:Unable to test as he does not participate with the exam.   Labs: Lab Results  Component Value Date   WBC 18.0 (H) 06/29/2016   HGB 15.0 06/29/2016   HCT 43.6 06/29/2016   PLT 255 06/29/2016   GLUCOSE 99 06/29/2016   ALT 59 06/21/2016   AST 66 (H) 06/21/2016   NA 143 06/29/2016   K 4.1 06/29/2016   CL 115 (H) 06/29/2016   CREATININE 0.76 06/29/2016   BUN 27 (H) 06/29/2016   CO2 19 (L) 06/29/2016   INR 1.03 06/21/2016   CBC Latest Ref Rng & Units 06/29/2016 06/28/2016 06/27/2016  WBC 4.0 - 10.5 K/uL 18.0(H) 16.1(H) 16.3(H)  Hemoglobin 13.0 - 17.0 g/dL 57.815.0 46.914.5 62.914.3  Hematocrit 39.0 - 52.0 % 43.6 42.4 40.4  Platelets 150 - 400 K/uL 255 220 197    No results found for: HGBA1C Lab Results  Component Value Date   ALT 59 06/21/2016   AST 66 (H) 06/21/2016   ALKPHOS 89 06/21/2016   BILITOT 1.1 06/21/2016     Radiology:  There is no new neuro imaging for review.  Other diagnostic studies:  EEG from 06/22/16 was interpreted as showing moderate generalized slowing without  epileptiform discharges or seizures. The back and frequencies are noted to be 5-6 hertz.  A/P:   1. 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 Continue supportive care. Avoid hypoxemia and hypotension.    2. Subarachnoid hemorrhage: This is acute, due to trauma. Treatment is supportive. Watch BP, avoid SBP >160. Antiplatelet agents and nonsteroidal anti-inflammatories are best avoided for 7-10 days following an acute traumatic hemorrhage. Therapeutic systemic anticoagulation is best deferred for 3-4 weeks.   3. 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 Treatment is supportive. Avoid antiplatelet agents, nonsteroidal anti-inflammatories, and therapeutic systemic anticoagulation as noted above.   4. Acute encephalopathy: This is due to traumatic brain injury. He looks good in today, much more alert and interactive and again following commands consistently. Continue to optimize metabolic status as you are.  Minimize CNS active medications, particularly opiates, benzodiazepines, and anything with strong anticholinergic properties. Continue amantadine. Cognitive rehab as able. I think he'll do very well in the long run with aggressive rehabilitation.  No family was present at the time of my visit today.  Neurology will continue to follow. Please call with any questions or concerns.  Rhona Leavens, MD [facility]

## 2016-06-29 NOTE — Progress Notes (Signed)
  Speech Language Pathology Treatment: Dysphagia  Patient Details Name: Tyler AskewDavid Lane MRN: [id number] DOB: 04/16/1950 Today's Date: 06/29/2016 Time: [id number] SLP Time Calculation (min) (ACUTE ONLY): 14 min  Assessment / Plan / Recommendation Clinical Impression  Pt is alert this afternoon yet he continues to have prolonged oral transit of boluses. He has deep inhalations that follow swallows of thin liquids, and multiple subswallows per pureed bolus. Both textures are followed by delayed coughing that is weak and guarded. Recommend that pt remain NPO for now. Should he continue to show signs of dysphagia as his mentation continues to clear, he may require instrumental testing for better assessment of oropharyngeal function.   HPI HPI: Patient is a 66 y.o. male admitted after fall from 10 foot ladder, suffering TBI, SAH, right clavicle fracture, right superior rami fracture. MR Brain revealed TBI with multiple microhemorrhages of right frontal lobe, left occipital lobe and right thalamus. MD also reported "widespread axonal injury and moderate cortical dysfunction."      SLP Plan  Continue with current plan of care       Recommendations  Diet recommendations: NPO Medication Administration: Via alternative means                Oral Care Recommendations: Oral care QID Follow up Recommendations: Inpatient Rehab SLP Visit Diagnosis: Dysphagia, unspecified (R13.10) Plan: Continue with current plan of care       GO                Maxcine Hamaiewonsky, Zamantha Strebel 06/29/2016, 2:58 PM  Maxcine HamLaura Paiewonsky, M.A. CCC-SLP [phone]

## 2016-06-29 NOTE — Progress Notes (Signed)
Rehab admissions - I did receive authorization for acute inpatient rehab admission.  However, patient is not medically ready at this time given his increasing WBC and fevers.  I will follow progress over the weekend and check back on Monday.  Call me for questions.  #[phone]

## 2016-06-29 NOTE — Progress Notes (Signed)
Pt seen and examined.  No issues overnight. More awake/reponsive this morning  EXAM: Temp:  [98.8 F (37.1 C)-101.1 F (38.4 C)] 98.8 F (37.1 C) (05/18 1200) Pulse Rate:  [66-103] 99 (05/18 1300) Resp:  [20-29] 28 (05/18 1300) BP: (135-181)/(88-111) 144/91 (05/18 1300) SpO2:  [97 %-100 %] 99 % (05/18 1300) Weight:  [77.4 kg (170 lb 10.2 oz)] 77.4 kg (170 lb 10.2 oz) (05/18 0500) Intake/Output      05/17 0701 - 05/18 0700 05/18 0701 - 05/19 0700   I.V. (mL/kg) 480 (6.2) 120 (1.6)   NG/GT 1440 360   IV Piggyback 450 50   Total Intake(mL/kg) 2370 (30.6) 530 (6.8)   Urine (mL/kg/hr) 3000 (1.6) 825 (1.4)   Stool 0 (0) 0 (0)   Total Output 3000 825   Net -630 -295        Stool Occurrence 4 x 2 x    Awake  Answers simple questions CN grossly intact MAE  Stable Continue current care

## 2016-06-30 LAB — BASIC METABOLIC PANEL
Anion gap: 10 (ref 5–15)
BUN: 30 mg/dL — ABNORMAL HIGH (ref 6–20)
CHLORIDE: 115 mmol/L — AB (ref 101–111)
CO2: 18 mmol/L — AB (ref 22–32)
CREATININE: 0.78 mg/dL (ref 0.61–1.24)
Calcium: 8.4 mg/dL — ABNORMAL LOW (ref 8.9–10.3)
GFR calc Af Amer: >60 mL/min (ref >60)
GFR calc non Af Amer: >60 mL/min (ref >60)
Glucose, Bld: 116 mg/dL — ABNORMAL HIGH (ref 65–99)
Potassium: 4.3 mmol/L (ref 3.5–5.1)
SODIUM: 143 mmol/L (ref 135–145)

## 2016-06-30 LAB — GLUCOSE, CAPILLARY
Glucose-Capillary: 107 mg/dL — ABNORMAL HIGH (ref 65–99)
Glucose-Capillary: 112 mg/dL — ABNORMAL HIGH (ref 65–99)
Glucose-Capillary: 117 mg/dL — ABNORMAL HIGH (ref 65–99)
Glucose-Capillary: 117 mg/dL — ABNORMAL HIGH (ref 65–99)
Glucose-Capillary: 129 mg/dL — ABNORMAL HIGH (ref 65–99)
Glucose-Capillary: 142 mg/dL — ABNORMAL HIGH (ref 65–99)

## 2016-06-30 NOTE — Progress Notes (Signed)
   Subjective/Chief Complaint: NAE  Pt con't to work with speech Tol TFs +BMs  Objective: Vital signs in last 24 hours: Temp:  [98.1 F (36.7 C)-99.9 F (37.7 C)] 99.9 F (37.7 C) (05/19 0700) Pulse Rate:  [84-103] 85 (05/19 0322) Resp:  [22-29] 24 (05/19 0322) BP: (132-157)/(91-111) 142/94 (05/19 0322) SpO2:  [98 %-100 %] 100 % (05/19 0322) Weight:  [76.3 kg (168 lb 3.4 oz)] 76.3 kg (168 lb 3.4 oz) (05/19 0322) Last BM Date: 06/29/16  Intake/Output from previous day: 05/18 0701 - 05/19 0700 In: 1120 [I.V.:260; NG/GT:810; IV Piggyback:50] Out: 2400 [Urine:2400] Intake/Output this shift: No intake/output data recorded.  General appearance: alert and cooperative GI: soft, non-tender; bowel sounds normal; no masses,  no organomegaly  Lab Results:   Recent Labs  06/28/16 0207 06/29/16 0326  WBC 16.1* 18.0*  HGB 14.5 15.0  HCT 42.4 43.6  PLT 220 255   BMET  Recent Labs  06/29/16 0326 06/30/16 0258  NA 143 143  K 4.1 4.3  CL 115* 115*  CO2 19* 18*  GLUCOSE 99 116*  BUN 27* 30*  CREATININE 0.76 0.78  CALCIUM 8.4* 8.4*   PT/INR No results for input(s): LABPROT, INR in the last 72 hours. ABG No results for input(s): PHART, HCO3 in the last 72 hours.  Invalid input(s): PCO2, PO2  Studies/Results: Dg Chest 2 View  Result Date: 06/29/2016 CLINICAL DATA:  Respiratory failure. EXAM: CHEST  2 VIEW COMPARISON:  06/28/2016.  06/27/2016 . FINDINGS: Feeding tube in stable position. Heart size normal. Persistent but improved diffuse right lung infiltrate. Mild left basilar atelectasis/infiltrate remains. Multiple right rib fractures and right clavicular fracture scratch again noted. IMPRESSION: 1.  Feeding tube in stable position. 2. Interim improvement of diffuse right lung infiltrate. Mild atelectasis/infiltrate left base again noted. Small right pleural effusion. 3. Multiple right rib fractures and right clavicular fracture again noted . No pneumothorax.  Electronically Signed   By: Maisie Fushomas  Register   On: 06/29/2016 07:16    Anti-infectives: Anti-infectives    Start     Dose/Rate Route Frequency Ordered Stop   06/25/16 1200  cefTRIAXone (ROCEPHIN) 2 g in dextrose 5 % 50 mL IVPB     2 g 100 mL/hr over 30 Minutes Intravenous Every 24 hours 06/25/16 1124        Assessment/Plan: Fall from ladder TBI w/ SAH andintraventricular hemorrhage  - EEG showed moderategeneralized slowing of brain activity and MRI showed traumatic parenchymal brain injury with multiple microhemorrhages - neurosurgery and neurology following, appreciate their recs - Amantadine per neurology - TBI team therapies Right clavicle fracture - WBAT BLE, Sling for RUE when out of bed per Dr. Eulah PontMurphy  right posterior 2-6 rib fractures with contusion and tiny PTX - pulmonary toilet ID - Rocephin for possible PNA, resp CX: few staph Acute urinary retention - on urecholine, doing well with condom cath now HTN - adjusted PRN hydralazine R displaced comminuted sacrum fractureand R pubic rami fracture - WBAT per Dr. Eulah PontMurphy C collar in place - CT scan cervical showed no abnormalities, re-examine vs flex ex once more alert FEN: Speech still rec NPO for now; con't TFs VTE: SCD's, no anticoagulation or antiplatelet 2-3 weeks per neurology Dispo - SDU, CIR following  LOS: 9 days    Marigene Ehlersamirez Jr., [facility] 06/30/2016

## 2016-06-30 NOTE — Progress Notes (Signed)
SLP Cancellation Note  Patient Details Name: Tyler AskewDavid Hulet MRN: [id number] DOB: 10-28-1950   Cancelled treatment:       Reason Eval/Treat Not Completed: Other (comment). Too lethargic for successful Po trials this am, has NG. Will continue to follow.    Frisco Cordts, Riley NearingBonnie Caroline 06/30/2016, 10:54 AM

## 2016-06-30 NOTE — Progress Notes (Signed)
Patient ID: Tyler AskewDavid Lane, male   DOB: 07-27-1950, 66 y.o.   MRN: [id number] Subjective: Patient seems stable overall. His eyes to painful stimuli. Flexes left side more than right side. He localizes on the left. Does not really follow commands. Some verbalization  Objective: Vital signs in last 24 hours: Temp:  [98.1 F (36.7 C)-99.9 F (37.7 C)] 98.8 F (37.1 C) (05/19 0718) Pulse Rate:  [81-99] 81 (05/19 1008) Resp:  [21-29] 21 (05/19 1008) BP: (132-157)/(91-111) 152/91 (05/19 1008) SpO2:  [98 %-100 %] 100 % (05/19 1008) Weight:  [76.3 kg (168 lb 3.4 oz)] 76.3 kg (168 lb 3.4 oz) (05/19 0322)  Intake/Output from previous day: 05/18 0701 - 05/19 0700 In: 1120 [I.V.:260; NG/GT:810; IV Piggyback:50] Out: 2400 [Urine:2400] Intake/Output this shift: Total I/O In: 1040 [I.V.:260; NG/GT:780] Out: -     Lab Results: Lab Results  Component Value Date   WBC 18.0 (H) 06/29/2016   HGB 15.0 06/29/2016   HCT 43.6 06/29/2016   MCV 92.0 06/29/2016   PLT 255 06/29/2016   Lab Results  Component Value Date   INR 1.03 06/21/2016   BMET Lab Results  Component Value Date   NA 143 06/30/2016   K 4.3 06/30/2016   CL 115 (H) 06/30/2016   CO2 18 (L) 06/30/2016   GLUCOSE 116 (H) 06/30/2016   BUN 30 (H) 06/30/2016   CREATININE 0.78 06/30/2016   CALCIUM 8.4 (L) 06/30/2016    Studies/Results: Dg Chest 2 View  Result Date: 06/29/2016 CLINICAL DATA:  Respiratory failure. EXAM: CHEST  2 VIEW COMPARISON:  06/28/2016.  06/27/2016 . FINDINGS: Feeding tube in stable position. Heart size normal. Persistent but improved diffuse right lung infiltrate. Mild left basilar atelectasis/infiltrate remains. Multiple right rib fractures and right clavicular fracture scratch again noted. IMPRESSION: 1.  Feeding tube in stable position. 2. Interim improvement of diffuse right lung infiltrate. Mild atelectasis/infiltrate left base again noted. Small right pleural effusion. 3. Multiple right rib fractures and  right clavicular fracture again noted . No pneumothorax. Electronically Signed   By: Maisie Fushomas  Register   On: 06/29/2016 07:16    Assessment/Plan: Overall seems stable. Continue current management. Following.   LOS: 9 days    Taccara Bushnell,Jamonte S 06/30/2016, 10:39 AM

## 2016-06-30 NOTE — Progress Notes (Signed)
Neurology Progress Note  Subjective: No major overnight events reported. He was transferred out of the neuro ICU to stepdown yesterday afternoon. No significant change in his neurologic status. He remains encephalopathic, following some commands. He denies any complaints on 10-point ROS.   Pertinent meds: Amantadine 100 mg twice daily Nicotine patch 21 mg daily Bethanechol 25 mg TID Ceftriaxone 2 g q24h  Current Meds:   Current Facility-Administered Medications:  .  0.9 %  sodium chloride infusion, , Intravenous, Continuous, Jimmye NormanWyatt, James, MD, Last Rate: 20 mL/hr at 06/30/16 0547 .  acetaminophen (TYLENOL) solution 650 mg, 650 mg, Per Tube, Q6H PRN, Violeta Gelinashompson, Burke, MD, 650 mg at 06/29/16 0033 .  acetaminophen (TYLENOL) suppository 650 mg, 650 mg, Rectal, Q4H PRN, Barnett AbuElsner, Henry, MD, 650 mg at 06/24/16 2358 .  amantadine (SYMMETREL) 50 MG/5ML solution 100 mg, 100 mg, Per Tube, BID, Jimmye NormanWyatt, James, MD, 100 mg at 06/29/16 2300 .  aspirin chewable tablet 81 mg, 81 mg, Per Tube, Daily, Jimmye NormanWyatt, James, MD, 81 mg at 06/29/16 1750 .  bethanechol (URECHOLINE) tablet 25 mg, 25 mg, Per Tube, TID, Violeta Gelinashompson, Burke, MD, 25 mg at 06/29/16 2300 .  cefTRIAXone (ROCEPHIN) 2 g in dextrose 5 % 50 mL IVPB, 2 g, Intravenous, Q24H, Focht, Jessica L, PA, Stopped at 06/29/16 1144 .  chlorhexidine (PERIDEX) 0.12 % solution 15 mL, 15 mL, Mouth Rinse, BID, Abigail MiyamotoBlackman, Douglas, MD, 15 mL at 06/29/16 2300 .  feeding supplement (PIVOT 1.5 CAL) liquid 1,000 mL, 1,000 mL, Per Tube, Q24H, Jimmye NormanWyatt, James, MD, Last Rate: 60 mL/hr at 06/29/16 1507, 1,000 mL at 06/29/16 1507 .  free water 200 mL, 200 mL, Per Tube, Q8H, Jimmye NormanWyatt, James, MD, 200 mL at 06/30/16 0547 .  hydrALAZINE (APRESOLINE) injection 10 mg, 10 mg, Intravenous, Q4H PRN, Violeta Gelinashompson, Burke, MD, 10 mg at 06/29/16 1019 .  HYDROcodone-acetaminophen (HYCET) 7.5-325 mg/15 ml solution 15 mL, 15 mL, Per Tube, Q4H PRN, Violeta Gelinashompson, Burke, MD, 15 mL at 06/30/16 0547 .  LORazepam (ATIVAN)  injection 0.5-1 mg, 0.5-1 mg, Intravenous, Q4H PRN, Barnett AbuElsner, Henry, MD, 1 mg at 06/26/16 2234 .  MEDLINE mouth rinse, 15 mL, Mouth Rinse, q12n4p, Abigail MiyamotoBlackman, Douglas, MD, 15 mL at 06/29/16 1508 .  morphine 4 MG/ML injection 2-4 mg, 2-4 mg, Intravenous, Q4H PRN, Jimmye NormanWyatt, James, MD, 4 mg at 06/29/16 0151 .  nicotine (NICODERM CQ - dosed in mg/24 hours) patch 21 mg, 21 mg, Transdermal, Daily, Jimmye NormanWyatt, James, MD, 21 mg at 06/29/16 0901 .  ondansetron (ZOFRAN) tablet 4 mg, 4 mg, Oral, Q6H PRN **OR** ondansetron (ZOFRAN) injection 4 mg, 4 mg, Intravenous, Q6H PRN, Abigail MiyamotoBlackman, Douglas, MD .  potassium chloride 20 MEQ/15ML (10%) solution 20 mEq, 20 mEq, Oral, TID, Jimmye NormanWyatt, James, MD, 20 mEq at 06/29/16 2300 .  pravastatin (PRAVACHOL) tablet 40 mg, 40 mg, Oral, q1800, Jimmye NormanWyatt, James, MD, 40 mg at 06/29/16 1750  Objective:  Temp:  [98.1 F (36.7 C)-99.2 F (37.3 C)] 98.7 F (37.1 C) (05/19 0322) Pulse Rate:  [84-103] 85 (05/19 0322) Resp:  [22-29] 24 (05/19 0322) BP: (132-157)/(91-111) 142/94 (05/19 0322) SpO2:  [98 %-100 %] 100 % (05/19 0322) Weight:  [76.3 kg (168 lb 3.4 oz)] 76.3 kg (168 lb 3.4 oz) (05/19 0322)  General: WDWN Caucasian man lying in bed. He opens his eyes to voice. He will follow midline and appendicular commands intermittently, often with mild delay. [facility] He answers yes/no questions inconsistently this morning. Sustained attention is impaired. HEENT: Cervical collar in place.  Sclerae are anicteric. He has mild conjunctival injection bilaterally.  Mucous membranes appear dry. CV: Regular, no murmur. Carotid pulses are 2+ and symmetric with no bruits. Distal pulses 2+ and symmetric.  Lungs: CTAB on anterior exam. Extremities: No C/C/E. Neuro: MS: As noted above.  CN: Pupils are difficult to see because he resists passive eye opening on both sides. His eyes appear to be conjugate. Corneals are intact and symmetric. His face appears to be symmetric with normal  strength. Eye closure is very strong and symmetric. Hearing appears to be intact to conversational voice. The remainder of cranial nerve examination is limited as he does not fully cooperate and because he is in a cervical collar.  Motor: Normal bulk, tone. He moves all four extremities with grossly normal strength. He is able to participate with confrontational testing to a limited extent and strength appears to be normal. No tremor or other abnormal movements are observed.  Sensation: He withdraws purposefully from minimal noxious stimuli 4.  DTRs: 3 +, symmetric. He has sustained clonus at the right ankle, 3-4 beats of clonus at the left ankle today. Crossed adductors are present bilaterally. Toes are upgoing bilaterally Hoffmann's reflexes present bilaterally.  Coordination:Unable to test as he does not participate with the exam.   Labs: Lab Results  Component Value Date   WBC 18.0 (H) 06/29/2016   HGB 15.0 06/29/2016   HCT 43.6 06/29/2016   PLT 255 06/29/2016   GLUCOSE 116 (H) 06/30/2016   ALT 59 06/21/2016   AST 66 (H) 06/21/2016   NA 143 06/30/2016   K 4.3 06/30/2016   CL 115 (H) 06/30/2016   CREATININE 0.78 06/30/2016   BUN 30 (H) 06/30/2016   CO2 18 (L) 06/30/2016   INR 1.03 06/21/2016   CBC Latest Ref Rng & Units 06/29/2016 06/28/2016 06/27/2016  WBC 4.0 - 10.5 K/uL 18.0(H) 16.1(H) 16.3(H)  Hemoglobin 13.0 - 17.0 g/dL 46.9 62.9 52.8  Hematocrit 39.0 - 52.0 % 43.6 42.4 40.4  Platelets 150 - 400 K/uL 255 220 197    No results found for: HGBA1C Lab Results  Component Value Date   ALT 59 06/21/2016   AST 66 (H) 06/21/2016   ALKPHOS 89 06/21/2016   BILITOT 1.1 06/21/2016     Radiology:  There is no new neuro imaging for review.  Other diagnostic studies:  EEG from 06/22/16 was interpreted as showing moderate generalized slowing without epileptiform discharges or seizures. The back and frequencies are noted to be 5-6 hertz.  A/P:   1. Traumatic brain injury: This is a  moderate injury, sustained from apparent fall from a ladder with resultant diffuse axonal injury and subarachnoid hemorrhage. Continue supportive care. Avoid hypoxemia and hypotension.    2. Subarachnoid hemorrhage: This is acute, due to trauma. Treatment is supportive. Watch BP, avoid SBP >160. Antiplatelet agents and nonsteroidal anti-inflammatories are best avoided for 7-10 days following an acute traumatic hemorrhage. Therapeutic systemic anticoagulation is best deferred for 3-4 weeks.   3. 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 Treatment is supportive. Avoid antiplatelet agents, nonsteroidal anti-inflammatories, and therapeutic systemic anticoagulation as noted above.   4. Acute encephalopathy: This is due to traumatic brain injury. He looks good in today, much more alert and interactive and again following commands consistently. Continue to optimize metabolic status as you are. Minimize CNS active medications, particularly opiates, benzodiazepines, and anything with strong anticholinergic properties. Continue amantadine. Cognitive rehab as able. He is moving towards rehab;  I expect he will have a good recovery in the long term. He may well have some residual cognitive difficulties after this injury but there is no way to accurately predict how severe these may be at this time.   No family was present at the time of my visit today.  Neurology will continue to follow. Please call with any questions or concerns.  Rhona Leavens, MD [facility]

## 2016-07-01 LAB — GLUCOSE, CAPILLARY
GLUCOSE-CAPILLARY: 115 mg/dL — AB (ref 65–99)
Glucose-Capillary: 111 mg/dL — ABNORMAL HIGH (ref 65–99)
Glucose-Capillary: 131 mg/dL — ABNORMAL HIGH (ref 65–99)

## 2016-07-01 NOTE — Progress Notes (Signed)
Neurology Progress Note  Subjective: No major overnight events reported. He is much more alert and interactive with me today. He denies any complaints on 10-point ROS.   Pertinent meds: Amantadine 100 mg twice daily Nicotine patch 21 mg daily Bethanechol 25 mg TID Ceftriaxone 2 g q24h  Current Meds:   Current Facility-Administered Medications:  .  0.9 %  sodium chloride infusion, , Intravenous, Continuous, Jimmye NormanWyatt, James, MD, Last Rate: 20 mL/hr at 06/30/16 0547 .  acetaminophen (TYLENOL) solution 650 mg, 650 mg, Per Tube, Q6H PRN, Violeta Gelinashompson, Burke, MD, 650 mg at 06/29/16 0033 .  acetaminophen (TYLENOL) suppository 650 mg, 650 mg, Rectal, Q4H PRN, Barnett AbuElsner, Henry, MD, 650 mg at 06/24/16 2358 .  amantadine (SYMMETREL) 50 MG/5ML solution 100 mg, 100 mg, Per Tube, BID, Jimmye NormanWyatt, James, MD, 100 mg at 06/30/16 2216 .  aspirin chewable tablet 81 mg, 81 mg, Per Tube, Daily, Jimmye NormanWyatt, James, MD, 81 mg at 06/30/16 1000 .  bethanechol (URECHOLINE) tablet 25 mg, 25 mg, Per Tube, TID, Violeta Gelinashompson, Burke, MD, 25 mg at 06/30/16 2216 .  cefTRIAXone (ROCEPHIN) 2 g in dextrose 5 % 50 mL IVPB, 2 g, Intravenous, Q24H, Focht, Jessica L, PA, Stopped at 06/30/16 1211 .  chlorhexidine (PERIDEX) 0.12 % solution 15 mL, 15 mL, Mouth Rinse, BID, Abigail MiyamotoBlackman, Douglas, MD, 15 mL at 06/30/16 2216 .  feeding supplement (PIVOT 1.5 CAL) liquid 1,000 mL, 1,000 mL, Per Tube, Q24H, Jimmye NormanWyatt, James, MD, Last Rate: 60 mL/hr at 07/01/16 0546, 1,000 mL at 07/01/16 0546 .  free water 200 mL, 200 mL, Per Tube, Q8H, Jimmye NormanWyatt, James, MD, 200 mL at 06/30/16 2217 .  hydrALAZINE (APRESOLINE) injection 10 mg, 10 mg, Intravenous, Q4H PRN, Violeta Gelinashompson, Burke, MD, 10 mg at 06/29/16 1019 .  HYDROcodone-acetaminophen (HYCET) 7.5-325 mg/15 ml solution 15 mL, 15 mL, Per Tube, Q4H PRN, Violeta Gelinashompson, Burke, MD, 15 mL at 06/30/16 0547 .  LORazepam (ATIVAN) injection 0.5-1 mg, 0.5-1 mg, Intravenous, Q4H PRN, Barnett AbuElsner, Henry, MD, 1 mg at 06/26/16 2234 .  MEDLINE mouth rinse, 15  mL, Mouth Rinse, q12n4p, Abigail MiyamotoBlackman, Douglas, MD, 15 mL at 06/30/16 1200 .  morphine 4 MG/ML injection 2-4 mg, 2-4 mg, Intravenous, Q4H PRN, Jimmye NormanWyatt, James, MD, 4 mg at 07/01/16 0811 .  nicotine (NICODERM CQ - dosed in mg/24 hours) patch 21 mg, 21 mg, Transdermal, Daily, Jimmye NormanWyatt, James, MD, 21 mg at 06/30/16 1001 .  ondansetron (ZOFRAN) tablet 4 mg, 4 mg, Oral, Q6H PRN **OR** ondansetron (ZOFRAN) injection 4 mg, 4 mg, Intravenous, Q6H PRN, Abigail MiyamotoBlackman, Douglas, MD .  potassium chloride 20 MEQ/15ML (10%) solution 20 mEq, 20 mEq, Oral, TID, Jimmye NormanWyatt, James, MD, 20 mEq at 06/30/16 2216 .  pravastatin (PRAVACHOL) tablet 40 mg, 40 mg, Oral, q1800, Jimmye NormanWyatt, James, MD, 40 mg at 06/30/16 1641  Objective:  Temp:  [97.7 F (36.5 C)-99.6 F (37.6 C)] 98.8 F (37.1 C) (05/20 0800) Pulse Rate:  [56-88] 81 (05/20 0800) Resp:  [18-23] 18 (05/20 0800) BP: (140-163)/(83-99) 140/88 (05/20 0800) SpO2:  [94 %-100 %] 100 % (05/20 0800) Weight:  [74.4 kg (164 lb 0.4 oz)] 74.4 kg (164 lb 0.4 oz) (05/20 0414)  General: WDWN Caucasian man lying in bed. His eyes are open and he looks towards me as I enter the room. He is answering all questions for me today. He is oriented to self and "[facility]", disoriented to time and unsure why he is here. He will follow all midline and appendicular commands with mild delay. He is [facility] and [facility].  Speech is mildly dysarthric but this could be due to restricted jaw movement 2/2 his cervical collar. He is perseverative.  HEENT: Cervical collar in place. Sclerae are anicteric. He has mild conjunctival injection bilaterally.  Mucous membranes appear dry. CV: Regular, no murmur. Carotid pulses are 2+ and symmetric with no bruits. Distal pulses 2+ and symmetric.  Lungs: CTAB on anterior exam. Extremities: No C/C/E. Neuro: MS: As noted above.  CN: Pupils equal and reactive from 3-->1 mm bilaterally. His eyes are conjugate. There is breakup of smooth pursuits but EOMI. No nystagmus. He  reports symmetric sensation. His face appears to be symmetric with normal strength. Hearing appears to be intact to conversational voice. His tongue protrudes to midline and has normal mobility. Cervical collar limits testing to a degree.  Motor: Normal bulk, tone. He moves all four extremities spontaneously. He gives somewhat variable effort with confrontational strength testing but appears to have a mild R hemiparesis, possibly affecting the arm more than the leg. No tremor or other abnormal movements are observed.  Sensation: He withdraws purposefully from minimal noxious stimuli 4. He reports intact sensation t/o.  DTRs: 3 +, symmetric. He has sustained clonus at the right ankle, 3-4 beats of clonus at the left ankle today. Crossed adductors are present bilaterally. Toes are upgoing bilaterally Hoffmann's reflexes present bilaterally.  Coordination: Somewhat limited by perseveration. Movements slow and deliberate, particularly on the R, but no overt dysmetria.   Labs: Lab Results  Component Value Date   WBC 18.0 (H) 06/29/2016   HGB 15.0 06/29/2016   HCT 43.6 06/29/2016   PLT 255 06/29/2016   GLUCOSE 116 (H) 06/30/2016   ALT 59 06/21/2016   AST 66 (H) 06/21/2016   NA 143 06/30/2016   K 4.3 06/30/2016   CL 115 (H) 06/30/2016   CREATININE 0.78 06/30/2016   BUN 30 (H) 06/30/2016   CO2 18 (L) 06/30/2016   INR 1.03 06/21/2016   CBC Latest Ref Rng & Units 06/29/2016 06/28/2016 06/27/2016  WBC 4.0 - 10.5 K/uL 18.0(H) 16.1(H) 16.3(H)  Hemoglobin 13.0 - 17.0 g/dL 02.7 25.3 66.4  Hematocrit 39.0 - 52.0 % 43.6 42.4 40.4  Platelets 150 - 400 K/uL 255 220 197    No results found for: HGBA1C Lab Results  Component Value Date   ALT 59 06/21/2016   AST 66 (H) 06/21/2016   ALKPHOS 89 06/21/2016   BILITOT 1.1 06/21/2016     Radiology:  There is no new neuro imaging for review.  Other diagnostic studies:  EEG from 06/22/16 was interpreted as showing moderate generalized slowing without  epileptiform discharges or seizures. The back and frequencies are noted to be 5-6 hertz.  A/P:   1. 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 Continue supportive care. Avoid hypoxemia and hypotension.    2. Subarachnoid hemorrhage: This is acute, due to trauma. Treatment is supportive. Watch BP, avoid SBP >160. Antiplatelet agents and nonsteroidal anti-inflammatories are best avoided for 7-10 days following an acute traumatic hemorrhage. Therapeutic systemic anticoagulation is best deferred for 3-4 weeks.   3. 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 Treatment is supportive. Avoid antiplatelet agents, nonsteroidal anti-inflammatories, and therapeutic systemic anticoagulation as noted above.   4. Acute encephalopathy: This is due to traumatic brain injury. He is again much more alert and interactive today, following commands consistently. Continue to optimize metabolic status as  you are. Minimize CNS active medications, particularly opiates, benzodiazepines, and anything with strong anticholinergic properties. Continue amantadine. Cognitive rehab as able. He will likely do well with TBI rehab and time.   No family was present at the time of my visit today.  I have no further recommendations at this time and will sign off. Please call if any new issues should arise.   Rhona Leavens, MD [facility]

## 2016-07-01 NOTE — Plan of Care (Signed)
Problem: Pain Managment: Goal: General experience of comfort will improve Outcome: Progressing Painaid is 0 no active distress noted will continue to monitor

## 2016-07-01 NOTE — Progress Notes (Signed)
Patient ID: Tyler Lane, male   DOB: 15-Oct-1950, 66 y.o.   MRN: [id number] Eyes open spontaneously, no change in physical exam today. Moves both sides. Appears stable.

## 2016-07-01 NOTE — Progress Notes (Signed)
   Subjective/Chief Complaint: No new events Stable in SDU Tolerating tube feeds without difficulty No new recs from NSU or Neurology  Objective: Vital signs in last 24 hours: Temp:  [97.7 F (36.5 C)-98.8 F (37.1 C)] 98.8 F (37.1 C) (05/20 0800) Pulse Rate:  [56-88] 81 (05/20 0800) Resp:  [18-23] 18 (05/20 0800) BP: (140-163)/(83-99) 140/88 (05/20 0800) SpO2:  [94 %-100 %] 100 % (05/20 0800) Weight:  [74.4 kg (164 lb 0.4 oz)] 74.4 kg (164 lb 0.4 oz) (05/20 0414) Last BM Date: 06/29/16  Intake/Output from previous day: 05/19 0701 - 05/20 0700 In: 2490 [I.V.:560; NG/GT:1880; IV Piggyback:50] Out: 2100 [Urine:2100] Intake/Output this shift: No intake/output data recorded.  General appearance: sleepy, cooperative GI: soft, non-tender; bowel sounds normal; no masses,  no organomegaly Moves all four extremities spontaneously  Lab Results:   Recent Labs  06/29/16 0326  WBC 18.0*  HGB 15.0  HCT 43.6  PLT 255   BMET  Recent Labs  06/29/16 0326 06/30/16 0258  NA 143 143  K 4.1 4.3  CL 115* 115*  CO2 19* 18*  GLUCOSE 99 116*  BUN 27* 30*  CREATININE 0.76 0.78  CALCIUM 8.4* 8.4*   PT/INR No results for input(s): LABPROT, INR in the last 72 hours. ABG No results for input(s): PHART, HCO3 in the last 72 hours.  Invalid input(s): PCO2, PO2  Studies/Results: No results found.  Anti-infectives: Anti-infectives    Start     Dose/Rate Route Frequency Ordered Stop   06/25/16 1200  cefTRIAXone (ROCEPHIN) 2 g in dextrose 5 % 50 mL IVPB     2 g 100 mL/hr over 30 Minutes Intravenous Every 24 hours 06/25/16 1124        Assessment/Plan: Fall from ladder TBI w/ SAH andintraventricular hemorrhage  - EEG showed moderategeneralized slowing of brain activity and MRI showed traumatic parenchymal brain injury with multiple microhemorrhages - neurosurgery and neurology following, appreciate their recs: Neurology has signed off - Amantadine per neurology - TBI  team therapies Right clavicle fracture - WBAT BLE, Sling for RUE when out of bed per Dr. Eulah PontMurphy  right posterior 2-6 rib fractures with contusion and tiny PTX -pulmonary toilet ID- Rocephin for possible PNA, resp CX: few staph Acute urinary retention- onurecholine, doing well with condom cath now HTN- adjusted PRN hydralazine R displaced comminuted sacrum fractureand R pubic rami fracture - WBAT per Dr. Eulah PontMurphy C collar in place- CT scan cervical showed no abnormalities, re-examine vs flex ex once more alert FEN: Speech still rec NPO for now; con't TFs VTE: SCD's, no anticoagulation or antiplatelet 2-3 weeks per neurology Dispo- SDU, CIR following  LOS: 10 days    Shaquira Moroz K. 07/01/2016

## 2016-07-02 ENCOUNTER — Inpatient Hospital Stay (HOSPITAL_COMMUNITY)
Admission: RE | Admit: 2016-07-02 | Discharge: 2016-07-31 | DRG: 949 | Disposition: A | Discharge status: Skilled Nursing Facility | Payer: Medicare HMO | Source: Intra-hospital | Attending: Physical Medicine & Rehabilitation | Admitting: Physical Medicine & Rehabilitation

## 2016-07-02 ENCOUNTER — Inpatient Hospital Stay (HOSPITAL_COMMUNITY): Payer: Medicare HMO

## 2016-07-02 DIAGNOSIS — R4189 Other symptoms and signs involving cognitive functions and awareness: Secondary | ICD-10-CM | POA: Diagnosis present

## 2016-07-02 DIAGNOSIS — R451 Restlessness and agitation: Secondary | ICD-10-CM | POA: Diagnosis not present

## 2016-07-02 DIAGNOSIS — R7989 Other specified abnormal findings of blood chemistry: Secondary | ICD-10-CM | POA: Diagnosis not present

## 2016-07-02 DIAGNOSIS — S22068D Other fracture of T7-T8 thoracic vertebra, subsequent encounter for fracture with routine healing: Secondary | ICD-10-CM | POA: Diagnosis not present

## 2016-07-02 DIAGNOSIS — Z7409 Other reduced mobility: Secondary | ICD-10-CM | POA: Diagnosis not present

## 2016-07-02 DIAGNOSIS — I82409 Acute embolism and thrombosis of unspecified deep veins of unspecified lower extremity: Secondary | ICD-10-CM | POA: Diagnosis not present

## 2016-07-02 DIAGNOSIS — G479 Sleep disorder, unspecified: Secondary | ICD-10-CM | POA: Diagnosis not present

## 2016-07-02 DIAGNOSIS — Z79899 Other long term (current) drug therapy: Secondary | ICD-10-CM

## 2016-07-02 DIAGNOSIS — R339 Retention of urine, unspecified: Secondary | ICD-10-CM | POA: Diagnosis not present

## 2016-07-02 DIAGNOSIS — R1312 Dysphagia, oropharyngeal phase: Secondary | ICD-10-CM | POA: Diagnosis not present

## 2016-07-02 DIAGNOSIS — S062X9D Diffuse traumatic brain injury with loss of consciousness of unspecified duration, subsequent encounter: Secondary | ICD-10-CM

## 2016-07-02 DIAGNOSIS — S42001A Fracture of unspecified part of right clavicle, initial encounter for closed fracture: Secondary | ICD-10-CM | POA: Diagnosis present

## 2016-07-02 DIAGNOSIS — J15211 Pneumonia due to Methicillin susceptible Staphylococcus aureus: Secondary | ICD-10-CM | POA: Diagnosis present

## 2016-07-02 DIAGNOSIS — S2241XD Multiple fractures of ribs, right side, subsequent encounter for fracture with routine healing: Secondary | ICD-10-CM | POA: Diagnosis not present

## 2016-07-02 DIAGNOSIS — S22058D Other fracture of T5-T6 vertebra, subsequent encounter for fracture with routine healing: Secondary | ICD-10-CM | POA: Diagnosis not present

## 2016-07-02 DIAGNOSIS — F329 Major depressive disorder, single episode, unspecified: Secondary | ICD-10-CM | POA: Diagnosis present

## 2016-07-02 DIAGNOSIS — Z7982 Long term (current) use of aspirin: Secondary | ICD-10-CM | POA: Diagnosis not present

## 2016-07-02 DIAGNOSIS — F17210 Nicotine dependence, cigarettes, uncomplicated: Secondary | ICD-10-CM | POA: Diagnosis present

## 2016-07-02 DIAGNOSIS — R1310 Dysphagia, unspecified: Secondary | ICD-10-CM | POA: Diagnosis present

## 2016-07-02 DIAGNOSIS — R739 Hyperglycemia, unspecified: Secondary | ICD-10-CM | POA: Diagnosis not present

## 2016-07-02 DIAGNOSIS — S42001D Fracture of unspecified part of right clavicle, subsequent encounter for fracture with routine healing: Secondary | ICD-10-CM

## 2016-07-02 DIAGNOSIS — S2239XA Fracture of one rib, unspecified side, initial encounter for closed fracture: Secondary | ICD-10-CM

## 2016-07-02 DIAGNOSIS — H9190 Unspecified hearing loss, unspecified ear: Secondary | ICD-10-CM | POA: Diagnosis present

## 2016-07-02 DIAGNOSIS — S32591D Other specified fracture of right pubis, subsequent encounter for fracture with routine healing: Secondary | ICD-10-CM

## 2016-07-02 DIAGNOSIS — I609 Nontraumatic subarachnoid hemorrhage, unspecified: Secondary | ICD-10-CM

## 2016-07-02 DIAGNOSIS — S3210XA Unspecified fracture of sacrum, initial encounter for closed fracture: Secondary | ICD-10-CM

## 2016-07-02 DIAGNOSIS — R52 Pain, unspecified: Secondary | ICD-10-CM

## 2016-07-02 DIAGNOSIS — S062X3D Diffuse traumatic brain injury with loss of consciousness of 1 hour to 5 hours 59 minutes, subsequent encounter: Principal | ICD-10-CM

## 2016-07-02 DIAGNOSIS — S42034S Nondisplaced fracture of lateral end of right clavicle, sequela: Secondary | ICD-10-CM | POA: Diagnosis not present

## 2016-07-02 DIAGNOSIS — I615 Nontraumatic intracerebral hemorrhage, intraventricular: Secondary | ICD-10-CM

## 2016-07-02 DIAGNOSIS — S299XXA Unspecified injury of thorax, initial encounter: Secondary | ICD-10-CM

## 2016-07-02 DIAGNOSIS — Z09 Encounter for follow-up examination after completed treatment for conditions other than malignant neoplasm: Secondary | ICD-10-CM

## 2016-07-02 DIAGNOSIS — Z4659 Encounter for fitting and adjustment of other gastrointestinal appliance and device: Secondary | ICD-10-CM

## 2016-07-02 DIAGNOSIS — S2249XA Multiple fractures of ribs, unspecified side, initial encounter for closed fracture: Secondary | ICD-10-CM

## 2016-07-02 DIAGNOSIS — I10 Essential (primary) hypertension: Secondary | ICD-10-CM | POA: Diagnosis present

## 2016-07-02 DIAGNOSIS — S062X3S Diffuse traumatic brain injury with loss of consciousness of 1 hour to 5 hours 59 minutes, sequela: Secondary | ICD-10-CM

## 2016-07-02 DIAGNOSIS — W11XXXD Fall on and from ladder, subsequent encounter: Secondary | ICD-10-CM | POA: Diagnosis present

## 2016-07-02 DIAGNOSIS — M7989 Other specified soft tissue disorders: Secondary | ICD-10-CM | POA: Diagnosis not present

## 2016-07-02 DIAGNOSIS — Z8249 Family history of ischemic heart disease and other diseases of the circulatory system: Secondary | ICD-10-CM

## 2016-07-02 DIAGNOSIS — E876 Hypokalemia: Secondary | ICD-10-CM | POA: Diagnosis not present

## 2016-07-02 DIAGNOSIS — S3219XD Other fracture of sacrum, subsequent encounter for fracture with routine healing: Secondary | ICD-10-CM

## 2016-07-02 DIAGNOSIS — J15212 Pneumonia due to Methicillin resistant Staphylococcus aureus: Secondary | ICD-10-CM | POA: Diagnosis not present

## 2016-07-02 DIAGNOSIS — S069X9D Unspecified intracranial injury with loss of consciousness of unspecified duration, subsequent encounter: Secondary | ICD-10-CM

## 2016-07-02 DIAGNOSIS — G3184 Mild cognitive impairment, so stated: Secondary | ICD-10-CM | POA: Diagnosis not present

## 2016-07-02 DIAGNOSIS — S42034D Nondisplaced fracture of lateral end of right clavicle, subsequent encounter for fracture with routine healing: Secondary | ICD-10-CM | POA: Diagnosis not present

## 2016-07-02 DIAGNOSIS — S42021D Displaced fracture of shaft of right clavicle, subsequent encounter for fracture with routine healing: Secondary | ICD-10-CM

## 2016-07-02 LAB — GLUCOSE, CAPILLARY
GLUCOSE-CAPILLARY: 119 mg/dL — AB (ref 65–99)
GLUCOSE-CAPILLARY: 115 mg/dL — AB (ref 65–99)
Glucose-Capillary: 117 mg/dL — ABNORMAL HIGH (ref 65–99)
Glucose-Capillary: 101 mg/dL — ABNORMAL HIGH (ref 65–99)
Glucose-Capillary: 113 mg/dL — ABNORMAL HIGH (ref 65–99)
Glucose-Capillary: 129 mg/dL — ABNORMAL HIGH (ref 65–99)
Glucose-Capillary: 115 mg/dL — ABNORMAL HIGH (ref 65–99)
Glucose-Capillary: 102 mg/dL — ABNORMAL HIGH (ref 65–99)

## 2016-07-02 LAB — CBC
HEMATOCRIT: 47.6 % (ref 39.0–52.0)
HEMOGLOBIN: 16.3 g/dL (ref 13.0–17.0)
MCH: 32.4 pg (ref 26.0–34.0)
MCHC: 34.2 g/dL (ref 30.0–36.0)
MCV: 94.6 fL (ref 78.0–100.0)
Platelets: 373 10*3/uL (ref 150–400)
RBC: 5.03 MIL/uL (ref 4.22–5.81)
RDW: 15.6 % — ABNORMAL HIGH (ref 11.5–15.5)
WBC: 15.9 10*3/uL — ABNORMAL HIGH (ref 4.0–10.5)

## 2016-07-02 MED ORDER — FREE WATER
200.0000 mL | Freq: Four times a day (QID) | Status: DC
  Administered 2016-07-03 – 2016-07-17 (×59): 200 mL

## 2016-07-02 MED ORDER — ACETAMINOPHEN 325 MG PO TABS: 325.0000 mg | ORAL_TABLET | ORAL | Status: DC | PRN

## 2016-07-02 MED ORDER — METOPROLOL TARTRATE 25 MG/10 ML ORAL SUSPENSION
12.5000 mg | Freq: Two times a day (BID) | ORAL | Status: DC
  Administered 2016-07-02: 12.5 mg
  Filled 2016-07-02: qty 5
  Filled 2016-07-02: qty 10

## 2016-07-02 MED ORDER — INSULIN ASPART 100 UNIT/ML ~~LOC~~ SOLN
0.0000 [IU] | SUBCUTANEOUS | Status: DC
Start: 2016-07-03 — End: 2016-07-02

## 2016-07-02 MED ORDER — CHLORHEXIDINE GLUCONATE 0.12 % MT SOLN
15.0000 mL | Freq: Two times a day (BID) | OROMUCOSAL | Status: DC
  Administered 2016-07-02 – 2016-07-31 (×58): 15 mL via OROMUCOSAL
  Filled 2016-07-02 (×58): qty 15

## 2016-07-02 MED ORDER — NICOTINE 21 MG/24HR TD PT24
21.0000 mg | MEDICATED_PATCH | Freq: Every day | TRANSDERMAL | Status: DC
  Administered 2016-07-03 – 2016-07-29 (×27): 21 mg via TRANSDERMAL
  Filled 2016-07-02 (×28): qty 1

## 2016-07-02 MED ORDER — LIDOCAINE HCL 2 % EX GEL: CUTANEOUS | Status: DC | PRN

## 2016-07-02 MED ORDER — PROCHLORPERAZINE 25 MG RE SUPP: 12.5000 mg | Freq: Four times a day (QID) | RECTAL | Status: DC | PRN

## 2016-07-02 MED ORDER — ASPIRIN 81 MG PO CHEW
81.0000 mg | CHEWABLE_TABLET | Freq: Every day | ORAL | Status: DC
  Administered 2016-07-03 – 2016-07-24 (×22): 81 mg
  Filled 2016-07-02 (×22): qty 1

## 2016-07-02 MED ORDER — GUAIFENESIN-DM 100-10 MG/5ML PO SYRP: 5.0000 mL | ORAL_SOLUTION | Freq: Four times a day (QID) | ORAL | Status: DC | PRN

## 2016-07-02 MED ORDER — AMANTADINE HCL 50 MG/5ML PO SYRP
100.0000 mg | ORAL_SOLUTION | Freq: Two times a day (BID) | ORAL | Status: DC
  Administered 2016-07-02 – 2016-07-03 (×2): 100 mg
  Filled 2016-07-02 (×2): qty 10

## 2016-07-02 MED ORDER — DEXTROSE 5 % IV SOLN
2.0000 g | INTRAVENOUS | Status: AC
  Administered 2016-07-03 – 2016-07-04 (×2): 2 g via INTRAVENOUS
  Filled 2016-07-02 (×2): qty 2

## 2016-07-02 MED ORDER — LORAZEPAM 2 MG/ML IJ SOLN: 0.5000 mg | INTRAMUSCULAR | Status: DC | PRN

## 2016-07-02 MED ORDER — BETHANECHOL CHLORIDE 25 MG PO TABS
25.0000 mg | ORAL_TABLET | Freq: Three times a day (TID) | ORAL | Status: DC
  Administered 2016-07-02 – 2016-07-24 (×66): 25 mg
  Filled 2016-07-02 (×66): qty 1

## 2016-07-02 MED ORDER — METOPROLOL TARTRATE 25 MG/10 ML ORAL SUSPENSION
12.5000 mg | Freq: Two times a day (BID) | ORAL | Status: DC
  Administered 2016-07-02 – 2016-07-24 (×43): 12.5 mg
  Filled 2016-07-02 (×44): qty 10

## 2016-07-02 MED ORDER — ORAL CARE MOUTH RINSE
15.0000 mL | Freq: Two times a day (BID) | OROMUCOSAL | Status: DC
  Administered 2016-07-03 – 2016-07-30 (×40): 15 mL via OROMUCOSAL

## 2016-07-02 MED ORDER — PROCHLORPERAZINE EDISYLATE 5 MG/ML IJ SOLN: 5.0000 mg | Freq: Four times a day (QID) | INTRAMUSCULAR | Status: DC | PRN

## 2016-07-02 MED ORDER — PRAVASTATIN SODIUM 40 MG PO TABS
40.0000 mg | ORAL_TABLET | Freq: Every day | ORAL | Status: DC
  Administered 2016-07-03 – 2016-07-30 (×28): 40 mg via ORAL
  Filled 2016-07-02 (×29): qty 1

## 2016-07-02 MED ORDER — INSULIN ASPART 100 UNIT/ML ~~LOC~~ SOLN
0.0000 [IU] | SUBCUTANEOUS | Status: DC
  Administered 2016-07-03 – 2016-07-18 (×22): 2 [IU] via SUBCUTANEOUS
  Administered 2016-07-19: 3 [IU] via SUBCUTANEOUS
  Administered 2016-07-19 – 2016-07-23 (×8): 2 [IU] via SUBCUTANEOUS

## 2016-07-02 MED ORDER — ALUM & MAG HYDROXIDE-SIMETH 200-200-20 MG/5ML PO SUSP: 30.0000 mL | ORAL | Status: DC | PRN

## 2016-07-02 MED ORDER — TRAZODONE HCL 50 MG PO TABS
25.0000 mg | ORAL_TABLET | Freq: Every evening | ORAL | Status: DC | PRN
  Administered 2016-07-04 – 2016-07-21 (×7): 50 mg
  Filled 2016-07-02 (×7): qty 1

## 2016-07-02 MED ORDER — FLEET ENEMA 7-19 GM/118ML RE ENEM: 1.0000 | ENEMA | Freq: Once | RECTAL | Status: DC | PRN

## 2016-07-02 MED ORDER — DIPHENHYDRAMINE HCL 12.5 MG/5ML PO ELIX: 12.5000 mg | ORAL_SOLUTION | Freq: Four times a day (QID) | ORAL | Status: DC | PRN

## 2016-07-02 MED ORDER — BISACODYL 10 MG RE SUPP
10.0000 mg | Freq: Every day | RECTAL | Status: DC | PRN
  Administered 2016-07-15 – 2016-07-20 (×2): 10 mg via RECTAL
  Filled 2016-07-02 (×2): qty 1

## 2016-07-02 MED ORDER — POLYETHYLENE GLYCOL 3350 17 G PO PACK
17.0000 g | PACK | Freq: Every day | ORAL | Status: DC | PRN
  Administered 2016-07-19: 17 g
  Filled 2016-07-02: qty 1

## 2016-07-02 MED ORDER — NEPRO/CARBSTEADY PO LIQD
1000.0000 mL | ORAL | Status: DC
  Administered 2016-07-03: 1000 mL via ORAL
  Filled 2016-07-02 (×2): qty 1000

## 2016-07-02 MED ORDER — PROCHLORPERAZINE MALEATE 5 MG PO TABS: 5.0000 mg | ORAL_TABLET | Freq: Four times a day (QID) | ORAL | Status: DC | PRN

## 2016-07-02 MED ORDER — HYDROCODONE-ACETAMINOPHEN 7.5-325 MG/15ML PO SOLN
15.0000 mL | ORAL | Status: DC | PRN
  Administered 2016-07-03 – 2016-07-30 (×24): 15 mL
  Filled 2016-07-02 (×24): qty 15

## 2016-07-02 NOTE — Progress Notes (Signed)
Occupational Therapy Treatment Patient Details Name: Tyler Lane MRN: [id number] DOB: Mar 25, 1950 Today's Date: 07/02/2016    History of present illness 66 y.o. male admitted after fall from 10 foot ladder, suffering TBI, SAH, right clavicle fracture, right superior rami fracture. MR Brain revealed TBI with multiple microhemorrhages of right frontal lobe, left occipital lobe and right thalamus. 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    OT comments  Pt seen as cotreat with PT. Mod A +2 to stand and Max A +2 to stand pivot to recliner. Pt able to attend to 1 step ADL task and wash face. Pt answering simple questions but not confabulation. Pt recognizes daughter and calls her by name. Pt more consistent with Rancho level 5. Continue to recommend CIR for intensive rehab. Discussed TBI booklet with daughter. Will continue to follow acutely.  Follow Up Recommendations  CIR;Supervision/Assistance - 24 hour    Equipment Recommendations  None recommended by OT    Recommendations for Other Services Rehab consult    Precautions / Restrictions Precautions Precautions: Fall Precaution Comments: R shoulder sling Required Braces or Orthoses: Other Brace/Splint Other Brace/Splint: has cortrax feeding tube Restrictions Weight Bearing Restrictions: Yes RUE Weight Bearing: Non weight bearing RLE Weight Bearing: Weight bearing as tolerated LLE Weight Bearing: Weight bearing as tolerated Other Position/Activity Restrictions:  (for UE clavical fx)       Mobility Bed Mobility Overal bed mobility: Needs Assistance Bed Mobility: Rolling;Sidelying to Sit Rolling: Mod assist Sidelying to sit: Max assist;+2 for physical assistance      General bed mobility comments: assist for bringing legs off EOB and for lifting trunk even after cues and increased time to push up with L UE (internally distracted due to  pain)  Transfers Overall transfer level: Needs assistance Equipment used: 2 person hand held assist Transfers: Sit to/from UGI CorporationStand;Stand Pivot Transfers Sit to Stand: Mod assist;+2 physical assistance Stand pivot transfers: +2 physical assistance;Max assist       General transfer comment: slow to initiate, but did come forward eventually, but limited participation once in pain and max A of 2 to pivot to chair    Balance Overall balance assessment: Needs assistance Sitting-balance support: Feet supported;Single extremity supported Sitting balance-Leahy Scale: Fair Sitting balance - Comments: able to sit EOB unaided, kept head to L until cues to look to R Postural control: Left lateral lean Standing balance support: Bilateral upper extremity supported Standing balance-Leahy Scale: Poor Standing balance comment: unable to achieve full upright standing. Strong posterior bias                           ADL either performed or assessed with clinical judgement   ADL Overall ADL's : Needs assistance/impaired     Grooming: Wash/dry hands;Wash/dry face;Minimal assistance;Sitting (EOB)   Upper Body Bathing: Maximal assistance;Sitting   Lower Body Bathing: Maximal assistance;Sit to/from stand   Upper Body Dressing : Maximal assistance;Sitting   Lower Body Dressing: Bed level;Maximal assistance       Toileting- Clothing Manipulation and Hygiene: Total assistance Toileting - Clothing Manipulation Details (indicate cue type and reason): foley; incontinent BM     Functional mobility during ADLs: Maximal assistance;+2 for physical assistance;Cueing for safety;Cueing for sequencing General ADL Comments: Pt unable to engage in ADLs today      Vision   Additional Comments: prefers L gaze but will track and attend in R visual field.  Perception     Praxis      Cognition Arousal/Alertness: Awake/alert Behavior During Therapy: Flat affect Overall Cognitive Status:  Impaired/Different from baseline Area of Impairment: Attention;Memory;Following commands;Orientation;Safety/judgement;Awareness;Problem solving;Rancho level;JFK Recovery Scale               Rancho Levels of Cognitive Functioning Rancho Los Amigos Scales of Cognitive Functioning: Confused/inappropriate/non-agitated Orientation Level: Disoriented to;Place;Time;Situation Current Attention Level: Focused Memory: Decreased short-term memory Following Commands: Follows one step commands inconsistently Safety/Judgement: Decreased awareness of safety;Decreased awareness of deficits Awareness: Intellectual Problem Solving: Slow processing;Decreased initiation;Difficulty sequencing;Requires tactile cues;Requires verbal cues General Comments: Pt recognizes his daughter and calls her by name. States he is from [place]. Requires mod redirectional vc        Exercises     Shoulder Instructions       General Comments      Pertinent Vitals/ Pain       Pain Assessment: Faces Faces Pain Scale: Hurts even more Pain Location: during transfer was limited in tolerance to weight bearing esp on L LE Pain Descriptors / Indicators: Grimacing;Guarding Pain Intervention(s): Monitored during session;Limited activity within patient's tolerance;Repositioned  Home Living                                          Prior Functioning/Environment              Frequency  Min 2X/week        Progress Toward Goals  OT Goals(current goals can now be found in the care plan section)  Progress towards OT goals: Progressing toward goals  Acute Rehab OT Goals Patient Stated Goal: none stated OT Goal Formulation: With family Time For Goal Achievement: 07/09/16 Potential to Achieve Goals: Good ADL Goals Pt Will Perform Grooming: with min assist;sitting Pt Will Perform Upper Body Bathing: with min assist;sitting Additional ADL Goal #1: Pt will sustain attention to familiar ADL task x 5  mins with min cues  Additional ADL Goal #2: Pt will follow one step commands consistently during ADLs   Plan Discharge plan remains appropriate    Co-evaluation    PT/OT/SLP Co-Evaluation/Treatment: Yes Reason for Co-Treatment: Complexity of the patient's impairments (multi-system involvement);Necessary to address cognition/behavior during functional activity;For patient/therapist safety PT goals addressed during session: Mobility/safety with mobility;Balance OT goals addressed during session: ADL's and self-care      AM-PAC PT "6 Clicks" Daily Activity     Outcome Measure   Help from another person eating meals?: Total Help from another person taking care of personal grooming?: A Lot Help from another person toileting, which includes using toliet, bedpan, or urinal?: Total Help from another person bathing (including washing, rinsing, drying)?: A Lot Help from another person to put on and taking off regular upper body clothing?: Total Help from another person to put on and taking off regular lower body clothing?: Total 6 Click Score: 8    End of Session Equipment Utilized During Treatment: Oxygen;Cervical collar  OT Visit Diagnosis: Pain;Cognitive communication deficit (R41.841) Pain - Right/Left: Right Pain - part of body: Shoulder   Activity Tolerance Patient tolerated treatment well   Patient Left with family/visitor present;with call bell/phone within reach;in chair;with chair alarm set   Nurse Communication Mobility status        Time: 1610-9604 OT Time Calculation (min): 27 min  Charges: OT General Charges $OT Visit: 1 Procedure OT Treatments $Self Care/Home Management :  8-22 mins  [facility], OT/L  [phone] 07/02/2016   Bird Swetz,HILLARY 07/02/2016, 2:48 PM

## 2016-07-02 NOTE — Discharge Summary (Signed)
[facility] Discharge Summary   Patient ID: Tyler AskewDavid Lane MRN: [id number] DOB/AGE: 1950/04/27 66 y.o.  Admit date: 06/21/2016 Discharge date: 07/02/2016  Admitting Diagnosis: Fall TBI Subarachnoid hemorrhage Intraventricular hemorrhage Right clavicle fracture Right posterior 2-6 ribs fractures Small Pneumothorax Right displaced comminuted sacrum fracture Right pubic rami fracture   Discharge Diagnosis Patient Active Problem List   Diagnosis Date Noted  . SAH (subarachnoid hemorrhage) (HCC) 07/02/2016  . Intracerebral hemorrhage, intraventricular (HCC) 07/02/2016  . Right clavicle fracture 07/02/2016  . Fracture of multiple ribs 07/02/2016  . Sacral fracture (HCC) 07/02/2016  . Fracture of clavicle, right, closed 06/22/2016  . TBI (traumatic brain injury) (HCC) 06/21/2016    Consultants Dr. Maeola HarmanJoseph Lane, Neurosurgery Dr. Eulah Lane, orthopedics Dr. Nicholas Lane, Neurology Dr. Claudette LawsAndrew Lane, Physical medicine and rehabilitation   Procedures None  HPI: Tyler Lane is a 66 year old male who presented to the ED as a level I trauma after being found down next to a ladder, presumed fall of about 10 feet. Patient's vitals at time of arrival to the ED were unremarkable. GCS of 8. Patient is able to follow commands and respond to pain but is unable to speak. Patient had bruising over the right side of his face and right side of his arm. Bedside cxr and pelvic xray showed rib fracture and stable pelvic fx. Patient found to have subarachnoid hemorrhage, right clavicle fracture, right rib fracture, pelvic fracture. Trauma surgery admitted pt to the ICU.   Hospital Course:  Orthopedics was consulted and recommended WBAT of BLE for pelvic fractures and sling for RUE when out of bed for clavicle fracture. Neurosurgery was consulted and recommended nonsurgical treatment of SAH and for consult to neurology.  Pt was nonverbal at time of admission. Repeat CT of head on  5/11 showed stable SAH with no new acute abnormalities. Neurology recommended EEG and MRI. The EEG was abnormal showing moderate generalized slowing of brain activity.  MRI on 5/11 showed scattered microhemorrhages in cortex and brainstem. On 5/12, pt was speaking more. Speech evaluated pt and recommended NPO. Neurology recommended amantadine. Pt was having fevers and chest xray on 5/13 was concerning for PNA of R lobe. Pt was started on Rocephin. Respiratory cultures grew Staph. Tube feeds were started on 5/14. PT had urinary retention so foley remained until 5/17 when condom cath was placed. Chest xray on 5/18 showed improvement of right lung infiltrate. Electrolytes were replenished as needed. C spine was cleared on 5/21 and c collar was removed. On 5/21 pt was moved to the floor, was tolerating tube feeds, pain well controlled, vital signs stable and felt stable for discharge to inpatient rehab.    Patient was discharged in good condition.   Follow-up Information    Tyler Lane, Tyler D, MD Follow up in 2 week(s).   Specialty:  Orthopedic Surgery Contact information: [address] [place] [id number] [phone]        [facility]. Call.   Why:  as needed  Contact information: [address] [place] [phone]          Signed: Joyce CopaJessica L [facility] 07/02/2016, 1:19 PM Pager: [phone] Consults: [phone] Mon-Fri 7:00 am-4:30 pm Sat-Sun 7:00 am-11:30 am

## 2016-07-02 NOTE — Progress Notes (Signed)
Physical Therapy Treatment Patient Details Name: Tyler Lane MRN: [id number] DOB: Jul 29, 1950 Today's Date: 07/02/2016    History of Present Illness 66 y.o. male admitted after fall from 10 foot ladder, suffering TBI, SAH, right clavicle fracture, right superior rami fracture. MR Brain revealed TBI with multiple microhemorrhages of right frontal lobe, left occipital lobe and right thalamus. 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     PT Comments    Patient progressing slowly, but more mobile this session allowing OOB to chair.  Still seems in pain when on his feet, but RN reported pt too lethargic to premedicate for PT today.  Daughter in room and pt able to recall her name and recognized that he could be worse off than he is, though could not recall nature of his accident few moments after informed.  Also with continued jargon/confabulation throughout session.  Will need extensive CIR level rehab prior to d/c home.    Follow Up Recommendations  CIR     Equipment Recommendations  Wheelchair (measurements PT);Wheelchair cushion (measurements PT);Hospital bed    Recommendations for Other Services       Precautions / Restrictions Precautions Precautions: Fall Precaution Comments: R shoulder sling Required Braces or Orthoses: Other Brace/Splint Other Brace/Splint: has cortrax feeding tube Restrictions Weight Bearing Restrictions: Yes RUE Weight Bearing: Non weight bearing RLE Weight Bearing: Weight bearing as tolerated LLE Weight Bearing: Weight bearing as tolerated Other Position/Activity Restrictions:  (for UE clavical fx)    Mobility  Bed Mobility Overal bed mobility: Needs Assistance Bed Mobility: Rolling;Sidelying to Sit Rolling: Max assist;+2 for physical assistance Sidelying to sit: Max assist;+2 for physical assistance Supine to sit: +2 for physical assistance;Total assist      General bed mobility comments: assist for bringing legs off EOB and for lifting trunk even after cues and increased time to push up with L UE (internally distracted due to pain)  Transfers Overall transfer level: Needs assistance Equipment used: 2 person hand held assist Transfers: Sit to/from [employer] Sit to Stand: Mod assist;+2 physical assistance Stand pivot transfers: +2 physical assistance;Max assist       General transfer comment: slow to initiate, but did come forward eventually, but limited participation once in pain and max A of 2 to pivot to chair  Ambulation/Gait                 Stairs            Wheelchair Mobility    Modified Rankin (Stroke Patients Only)       Balance Overall balance assessment: Needs assistance Sitting-balance support: Feet supported;Single extremity supported Sitting balance-Leahy Scale: Fair Sitting balance - Comments: able to sit EOB unaided, kept head to L until cues to look to R Postural control: Left lateral lean Standing balance support: Bilateral upper extremity supported Standing balance-Leahy Scale: Poor Standing balance comment: fearful and posterior in standing                            Cognition Arousal/Alertness: Awake/alert Behavior During Therapy: Flat affect Overall Cognitive Status: Impaired/Different from baseline Area of Impairment: Attention;Memory;Following commands;Orientation;Safety/judgement;Awareness;Problem solving;Rancho level;JFK Recovery Scale               Rancho Levels of Cognitive Functioning Rancho Los Amigos Scales of Cognitive Functioning: Confused/inappropriate/non-agitated Orientation Level: Disoriented to;Place;Time;Situation Current Attention Level: Focused Memory: Decreased short-term memory Following Commands: Follows  one step commands inconsistently Safety/Judgement: Decreased awareness of safety;Decreased awareness of deficits Awareness:  Intellectual Problem Solving: Slow processing;Decreased initiation;Difficulty sequencing;Requires tactile cues;Requires verbal cues General Comments: Pt recognizes his daughter and calls her by name. [place]. Requires mod redirectional vc      Exercises      General Comments        Pertinent Vitals/Pain Pain Assessment: Faces Faces Pain Scale: Hurts even more Pain Location: during transfer was limited in tolerance to weight bearing esp on L LE Pain Descriptors / Indicators: Grimacing;Guarding Pain Intervention(s): Monitored during session;Limited activity within patient's tolerance;Repositioned    Home Living                      Prior Function            PT Goals (current goals can now be found in the care plan section) Acute Rehab PT Goals Patient Stated Goal: none stated Progress towards PT goals: Progressing toward goals    Frequency    Min 3X/week      PT Plan Current plan remains appropriate    Co-evaluation PT/OT/SLP Co-Evaluation/Treatment: Yes Reason for Co-Treatment: Complexity of the patient's impairments (multi-system involvement);Necessary to address cognition/behavior during functional activity;For patient/therapist safety PT goals addressed during session: Mobility/safety with mobility;Balance OT goals addressed during session: ADL's and self-care      AM-PAC PT "6 Clicks" Daily Activity  Outcome Measure  Difficulty turning over in bed (including adjusting bedclothes, sheets and blankets)?: Total Difficulty moving from lying on back to sitting on the side of the bed? : Total Difficulty sitting down on and standing up from a chair with arms (e.g., wheelchair, bedside commode, etc,.)?: Total Help needed moving to and from a bed to chair (including a wheelchair)?: A Lot Help needed walking in hospital room?: Total Help needed climbing 3-5 steps with a railing? : Total 6 Click Score: 7    End of Session   Activity  Tolerance: Patient tolerated treatment well Patient left: with call bell/phone within reach;with family/visitor present;in chair;with chair alarm set Nurse Communication: Mobility status PT Visit Diagnosis: Difficulty in walking, not elsewhere classified (R26.2);Other symptoms and signs involving the nervous system (R29.898);Pain Pain - Right/Left: Right Pain - part of body: Shoulder     Time: [id number] PT Time Calculation (min) (ACUTE ONLY): 27 min  Charges:  $Therapeutic Activity: 8-22 mins                    G CodesSheran Lawless:       Cyndi Wynn, [place] [phone] 07/02/2016    Elray Mcgregorynthia Wynn 07/02/2016, 4:07 PM

## 2016-07-02 NOTE — Progress Notes (Signed)
Cortrak Tube Team Note:  Consult received to place a Cortrak feeding tube.   A 10 F Cortrak tube was placed in the left nare and secured with a nasal bridle at 89 cm. Per the Cortrak monitor reading the tube tip is post pyloric.   No x-ray is required. RN may begin using tube.   If the tube becomes dislodged please keep the tube and contact the Cortrak team at [email or web address] (password [email or web address]) for replacement.  If after hours and replacement cannot be delayed, place a NG tube and confirm placement with an abdominal x-ray.    Coulton Schlink MS, RD, LDN Pager #- [phone]   

## 2016-07-02 NOTE — H&P (Addendum)
Physical Medicine and Rehabilitation Admission H&P    Chief Complaint  Patient presents with  . TBI    HPI: Tyler Lane is a 66 y.o. male with history of HTN, depression, hearing loss who was admitted on 06/21/16 after unwitnessed fall off 10' ladder. He was found down with GCS 8 at admission. CT head done showing mild acute SAH along sylvian fissures bilaterally, at high left parietal lobe and righ frontal lobe. Work up also revealed displaced fracture of right mid clavicle, right 2nd to 6 rib fractures with pulmonary contusion, contusion, mildly displaced comminuted fracture of right pubic ramus and comminuted transverse fracture of proximal right pubic ramus and compression deformity of 6th and 7th thoracic vertebral body of uncertain etiology. Patient with aphasia and Dr.Stern question stroke. Neurology consulted and MRI brain reviewed, suggestive of DAI.  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 EEG showed moderate generalized slowing due to non specific etiology. 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   Ortho/Dr. Percell Miller consulted for input and recommended WBAT BLE and RUE with sling of comfort. Patient with  Moderate to severe cognitive linguistic impairment as well as evidence of dysphagia--NPO recommended.  He was started on Rocephin for RLL PNA. Cortack placed for nutritional support yesterday and he remains NPO due to fluctuating bouts of lethargy. He has had issues with urinary retention, acute renal failure, persistent leucocytosis and low grade fevers.  He is limited by bouts of agitation, difficulty following simple commands requiring verbal/ tactile cues, showing decreased  confabulation, difficulty with posture due to pain and significant deficits in mobility and ability to carry out ADL tasks.  Cortack  dislodged this am and was found in oral cavity--to be replaced today.  CIR recommended for follow up therapy.    Review of Systems  Unable to perform ROS: Mental acuity   Past Medical History:  Diagnosis Date  . Depression   . Heart murmur    previously  . High cholesterol   . HOH (hard of hearing)    mild in the right and moderate to severe in the left  . Hypertension   . Tinnitus of left ear     Past Surgical History:  Procedure Laterality Date  . NASAL SINUS SURGERY      Family History  Problem Relation Age of Onset  . Diabetes Mother   . Heart attack Father     Social History:  Lives with significant other--have been together for 15 + years. 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 per reports that he has been smoking Cigarettes--1 PPD.  He has a 20.00 pack-year smoking history. He has never used smokeless tobacco. Per reports that he does not drink alcohol or use drugs.    Allergies: No Known Allergies    Medications Prior to Admission  Medication Sig Dispense Refill  . aspirin EC 81 MG tablet Take 81 mg by mouth daily.    . calcium carbonate (TUMS - DOSED IN MG ELEMENTAL CALCIUM) 500 MG chewable tablet Chew 1-2 tablets by mouth daily as needed for indigestion or heartburn.    . citalopram (CELEXA) 40 MG tablet Take 40 mg by mouth daily.    . hydrochlorothiazide (HYDRODIURIL) 25 MG tablet Take 25 mg by mouth daily.    Marland Kitchen ibuprofen (  ADVIL,MOTRIN) 200 MG tablet Take 200-800 mg by mouth every 6 (six) hours as needed for mild pain.    Marland Kitchen loratadine (CLARITIN) 10 MG tablet Take 10 mg by mouth daily.    Marland Kitchen lovastatin (MEVACOR) 20 MG tablet Take 40 mg by mouth daily.      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  Prior to that, he worked as a Engineer, petroleum Status:  Mobility: Bed Mobility Overal bed mobility: Needs Assistance Bed Mobility: Rolling, Supine to Sit, Sit to Supine Rolling: +2 for physical assistance, Total assist Supine to sit: +2 for physical assistance, Total assist Sit to supine: +2 for physical assistance, Max assist General bed mobility comments: Pt resistant to motion towards EOB likely due to pain, keeping body rigid and in extension until we got positioned in sitting with feet on the ground and L UE proped.  He seemed to want to initiate move back to supine, but then realized pain started with the transition and started resisting the movement back down.  Pt very resistive to rolling L to position bedpan for BM (he was able to verbalize that he needed to have a BM prior to lying down).   Transfers Overall transfer level: Needs assistance Equipment used:  (two person face to face with chuck pad) Transfers: Sit to/from Stand Sit to Stand: Max assist, +2 physical assistance General transfer comment: NT today due to fatigue and focus on coughing and eating EOB.       ADL: ADL Overall ADL's : Needs assistance/impaired Eating/Feeding: Maximal assistance Eating/Feeding Details (indicate cue type and reason): Pt trialed ice chips with SLP.  Pt able to hold spoon in Lt hand with min A and required mod a to move spoon to mouth for 4-5 single bites with prompting  Grooming: Wash/dry hands, Wash/dry face, Oral care, Brushing hair, Total assistance, Bed level Upper Body Bathing: Total assistance, Bed level Lower Body Bathing: Total assistance, Bed level Upper Body Dressing : Total assistance, Bed level Lower Body Dressing: Total assistance, Bed level Toilet Transfer: Total assistance Toilet Transfer Details  (indicate cue type and reason): unable  Toileting- Clothing Manipulation and Hygiene: Total assistance, Bed level General ADL Comments: Pt unable to engage in ADLs today   Cognition: Cognition Overall Cognitive Status: Impaired/Different from baseline Arousal/Alertness: Lethargic Orientation Level: Oriented to person, Disoriented to time, Disoriented to place, Disoriented to situation Attention: Focused Focused Attention: Impaired Focused Attention Impairment: Verbal basic, Functional basic Memory: Impaired Awareness: Impaired Awareness Impairment: Intellectual impairment Problem Solving: Impaired Problem Solving Impairment: Verbal basic, Functional basic Safety/Judgment: Impaired [place] [employer] Scales of Cognitive Functioning: Confused/inappropriate/non-agitated (when aroused he presents more like a V) Cognition Arousal/Alertness: Lethargic Behavior During Therapy: Restless, Impulsive Overall Cognitive Status: Impaired/Different from baseline Area of Impairment: Attention Orientation Level: Disoriented to, Place, Time, Situation Current Attention Level: Focused Following Commands: Follows one step commands inconsistently General Comments: Pt when aroused and breifly focused, can answer some basic questions.  He had breif periods of attention to task with SLP and some self feeding, but does need frequent verbal and tactile input to arouse and focus.  During these periods he is fairly accurate with his responses and follows some simple commands.     Blood pressure (!) 144/95, pulse  84, temperature 99 F (37.2 C), temperature source Oral, resp. rate (!) 22, height 5' 11"  (1.803 m), weight 77.4 kg (170 lb 10.2 oz), SpO2 99 %. Physical Exam  Vitals reviewed. Constitutional: He appears well-developed and well-nourished.  Flushed appearing.   HENT:  Head: Normocephalic.  Right Ear: External ear normal.  Left Ear: External ear normal.  Mouth/Throat: Oropharynx is clear and moist.    Eyes: Conjunctivae and EOM are normal. Pupils are equal, round, and reactive to light. Right eye exhibits no discharge. Left eye exhibits no discharge.  Neck: Normal range of motion. Neck supple.  Cardiovascular: Normal rate and regular rhythm.   Respiratory: Effort normal and breath sounds normal. No stridor. He exhibits no tenderness.  GI: Soft. Bowel sounds are normal. He exhibits no distension. There is no tenderness.  Musculoskeletal: He exhibits no edema or tenderness.  Neurological: He is alert.  Flat affect. Does not make eye contact. Able to state his name only.  Language of confusion with jargon at times--- needed max cues to state his daughter's name. Needed verbal/tactile cues to follow simple motor commands.   Motor (limited by participation): >/4/5 throughout A&Ox1 HOH  Skin: Skin is warm and dry.  Psychiatric: His affect is blunt and labile. His speech is delayed and tangential. He is slowed. Cognition and memory are impaired. He expresses impulsivity. He is inattentive.    Results for orders placed or performed during the hospital encounter of 06/21/16 (from the past 48 hour(s))  Glucose, capillary     Status: Abnormal   Collection Time: 06/27/16 11:42 AM  Result Value Ref Range   Glucose-Capillary 120 (H) 65 - 99 mg/dL   Comment 1 Notify RN    Comment 2 Document in Chart   Glucose, capillary     Status: Abnormal   Collection Time: 06/27/16  3:28 PM  Result Value Ref Range   Glucose-Capillary 124 (H) 65 - 99 mg/dL   Comment 1 Notify RN    Comment 2 Document in Chart   Glucose, capillary     Status: Abnormal   Collection Time: 06/27/16  7:30 PM  Result Value Ref Range   Glucose-Capillary 113 (H) 65 - 99 mg/dL  Glucose, capillary     Status: Abnormal   Collection Time: 06/28/16 12:01 AM  Result Value Ref Range   Glucose-Capillary 133 (H) 65 - 99 mg/dL  Basic metabolic panel     Status: Abnormal   Collection Time: 06/28/16  2:07 AM  Result Value Ref Range    Sodium 142 135 - 145 mmol/L   Potassium 3.4 (L) 3.5 - 5.1 mmol/L   Chloride 115 (H) 101 - 111 mmol/L   CO2 20 (L) 22 - 32 mmol/L   Glucose, Bld 116 (H) 65 - 99 mg/dL   BUN 23 (H) 6 - 20 mg/dL   Creatinine, Ser 0.73 0.61 - 1.24 mg/dL   Calcium 8.1 (L) 8.9 - 10.3 mg/dL   GFR calc non Af Amer >60 >60 mL/min   GFR calc Af Amer >60 >60 mL/min    Comment: (NOTE) The eGFR has been calculated using the CKD EPI equation. This calculation has not been validated in all clinical situations. eGFR's persistently <60 mL/min signify possible Chronic Kidney Disease.    Anion gap 7 5 - 15  CBC with Differential/Platelet     Status: Abnormal   Collection Time: 06/28/16  2:07 AM  Result Value Ref Range   WBC 16.1 (H) 4.0 - 10.5 K/uL   RBC  4.66 4.22 - 5.81 MIL/uL   Hemoglobin 14.5 13.0 - 17.0 g/dL   HCT 42.4 39.0 - 52.0 %   MCV 91.0 78.0 - 100.0 fL   MCH 31.1 26.0 - 34.0 pg   MCHC 34.2 30.0 - 36.0 g/dL   RDW 14.3 11.5 - 15.5 %   Platelets 220 150 - 400 K/uL   Neutrophils Relative % 77 %   Lymphocytes Relative 11 %   Monocytes Relative 10 %   Eosinophils Relative 2 %   Basophils Relative 0 %   Neutro Abs 12.4 (H) 1.7 - 7.7 K/uL   Lymphs Abs 1.8 0.7 - 4.0 K/uL   Monocytes Absolute 1.6 (H) 0.1 - 1.0 K/uL   Eosinophils Absolute 0.3 0.0 - 0.7 K/uL   Basophils Absolute 0.0 0.0 - 0.1 K/uL   WBC Morphology MILD LEFT SHIFT (1-5% METAS, OCC MYELO, OCC BANDS)   Glucose, capillary     Status: Abnormal   Collection Time: 06/28/16  3:22 AM  Result Value Ref Range   Glucose-Capillary 101 (H) 65 - 99 mg/dL  Glucose, capillary     Status: Abnormal   Collection Time: 06/28/16  8:22 AM  Result Value Ref Range   Glucose-Capillary 131 (H) 65 - 99 mg/dL  Glucose, capillary     Status: Abnormal   Collection Time: 06/28/16 12:25 PM  Result Value Ref Range   Glucose-Capillary 115 (H) 65 - 99 mg/dL  Glucose, capillary     Status: Abnormal   Collection Time: 06/28/16  4:20 PM  Result Value Ref Range    Glucose-Capillary 107 (H) 65 - 99 mg/dL  Glucose, capillary     Status: Abnormal   Collection Time: 06/28/16  7:49 PM  Result Value Ref Range   Glucose-Capillary 120 (H) 65 - 99 mg/dL   Comment 1 Notify RN   Glucose, capillary     Status: Abnormal   Collection Time: 06/29/16 12:20 AM  Result Value Ref Range   Glucose-Capillary 117 (H) 65 - 99 mg/dL   Comment 1 Notify RN   CBC     Status: Abnormal   Collection Time: 06/29/16  3:26 AM  Result Value Ref Range   WBC 18.0 (H) 4.0 - 10.5 K/uL   RBC 4.74 4.22 - 5.81 MIL/uL   Hemoglobin 15.0 13.0 - 17.0 g/dL   HCT 43.6 39.0 - 52.0 %   MCV 92.0 78.0 - 100.0 fL   MCH 31.6 26.0 - 34.0 pg   MCHC 34.4 30.0 - 36.0 g/dL   RDW 14.6 11.5 - 15.5 %   Platelets 255 150 - 400 K/uL  Basic metabolic panel     Status: Abnormal   Collection Time: 06/29/16  3:26 AM  Result Value Ref Range   Sodium 143 135 - 145 mmol/L   Potassium 4.1 3.5 - 5.1 mmol/L    Comment: NO VISIBLE HEMOLYSIS   Chloride 115 (H) 101 - 111 mmol/L   CO2 19 (L) 22 - 32 mmol/L   Glucose, Bld 99 65 - 99 mg/dL   BUN 27 (H) 6 - 20 mg/dL   Creatinine, Ser 0.76 0.61 - 1.24 mg/dL   Calcium 8.4 (L) 8.9 - 10.3 mg/dL   GFR calc non Af Amer >60 >60 mL/min   GFR calc Af Amer >60 >60 mL/min    Comment: (NOTE) The eGFR has been calculated using the CKD EPI equation. This calculation has not been validated in all clinical situations. eGFR's persistently <60 mL/min signify possible Chronic Kidney Disease.  Anion gap 9 5 - 15  Glucose, capillary     Status: None   Collection Time: 06/29/16  3:48 AM  Result Value Ref Range   Glucose-Capillary 99 65 - 99 mg/dL   Comment 1 Notify RN   Glucose, capillary     Status: Abnormal   Collection Time: 06/29/16  7:49 AM  Result Value Ref Range   Glucose-Capillary 132 (H) 65 - 99 mg/dL   Dg Chest 2 View  Result Date: 06/29/2016 CLINICAL DATA:  Respiratory failure. EXAM: CHEST  2 VIEW COMPARISON:  06/28/2016.  06/27/2016 . FINDINGS: Feeding tube  in stable position. Heart size normal. Persistent but improved diffuse right lung infiltrate. Mild left basilar atelectasis/infiltrate remains. Multiple right rib fractures and right clavicular fracture scratch again noted. IMPRESSION: 1.  Feeding tube in stable position. 2. Interim improvement of diffuse right lung infiltrate. Mild atelectasis/infiltrate left base again noted. Small right pleural effusion. 3. Multiple right rib fractures and right clavicular fracture again noted . No pneumothorax. Electronically Signed   By: Marcello Moores  Register   On: 06/29/2016 07:16   Dg Chest Port 1 View  Result Date: 06/28/2016 CLINICAL DATA:  Follow-up right-sided infiltrate. EXAM: PORTABLE CHEST 1 VIEW COMPARISON:  Chest x-ray of Jun 27, 2016 and Jun 26, 2016 FINDINGS: There is persistent increased density throughout the right lung especially in its mid and lower portions. The left lung is well-expanded and clear. There is no mediastinal shift. The heart and pulmonary vascularity are normal. There is calcification in the wall of the aortic arch. The feeding tube tip projects below the inferior margin of the image. Multiple right-sided rib fractures are again demonstrated. IMPRESSION: Persistent right-sided infiltrate compatible with pneumonia. Possible posterior layering of pleural fluid as well. No visible pneumothorax. When the patient can tolerate the procedure, a PA and lateral chest x-ray would be useful. Electronically Signed   By: Rogerio  Martinique M.D.   On: 06/28/2016 07:24       Medical Problem List and Plan: 1.  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  DVT Prophylaxis/Anticoagulation: Mechanical: Sequential compression devices, below knee Bilateral lower extremities 3. Pain Management: Hydrocodone prn. Monitor for sedation.  4. Mood: [name] to follow for evaluation and support as mentation improves.  5. Neuropsych: This patient is not capable of making decisions on his own  behalf. 6. Skin/Wound Care: routine skin care 7. Fluids/Electrolytes/Nutrition: Monitor I/O. Check lytes in am.  8. Right displaced sacral and right pubic rami fracture: WBAT 9. Right clavicle fracture: NWB --to keep arm below 90 degrees and sling when out of bed. 10. Acute urinary retention: Monitor voiding with PVR checks. Check UA/UCS. Attempt to toilet patient every 4 hours.  11. MSSA RLL PNA: Continues to have low grade fevers. Will continue rocephin Day # 8/10. Question aspiration again with tube found in mouth. Will monitor for spikes in temperature.  12. Pre-renal azotemia: will change tube feed to Nephro. Increase water flushes to qid. Monitor renal status serially.  13. HTN: On metoprolol bid. Will likely need to titrate medications.    Post Admission Physician Evaluation: 1. Preadmission assessment reviewed and changes made below. 2. Functional deficits secondary  to DIA. 3. Patient is admitted to receive collaborative, interdisciplinary care between the physiatrist, rehab nursing staff, and therapy team. 4. Patient's level of medical complexity and substantial therapy needs in context of that medical necessity cannot be provided at a lesser intensity of care such as a SNF. 5. Patient has experienced  substantial functional loss from his/her baseline which was documented above under the "Functional History" and "Functional Status" headings.  Judging by the patient's diagnosis, physical exam, and functional history, the patient has potential for functional progress which will result in measurable gains while on inpatient rehab.  These gains will be of substantial and practical use upon discharge  in facilitating mobility and self-care at the household level. 6. Physiatrist will provide 24 hour management of medical needs as well as oversight of the therapy plan/treatment and provide guidance as appropriate regarding the interaction of the two. 7. The Preadmission Screening has been  reviewed and patient status is unchanged unless otherwise stated above. 8. 24 hour rehab nursing will assist with bladder management, bowel management, safety, skin/wound care, disease management, medication administration, pain management and patient education  and help integrate therapy concepts, techniques,education, etc. 9. PT will assess and treat for/with: Lower extremity strength, range of motion, stamina, balance, functional mobility, safety, adaptive techniques and equipment,  coping skills, pain control, education.   Goals are: Min/mod A. 10. OT will assess and treat for/with: ADL's, functional mobility, safety, upper extremity strength, adaptive techniques and equipment, ego support, and community reintegration.   Goals are: Min/Mod A. Therapy may proceed with showering this patient. 11. SLP will assess and treat for/with: swallowing, language, cognition, speech.  Goals are: Mod/Min A. 12. Case Management and Social Worker will assess and treat for psychological issues and discharge planning. 67. Team conference will be held weekly to assess progress toward goals and to determine barriers to discharge. 14. Patient will receive at least 3 hours of therapy per day at least 5 days per week. 15. ELOS: 22-27 days.  16. Prognosis:  good and fair  Delice Lesch, MD, Mellody Drown [phone] Bary Leriche, PA-C 06/29/2016

## 2016-07-02 NOTE — Progress Notes (Signed)
  Speech Language Pathology Treatment: Dysphagia;Cognitive-Linquistic  Patient Details Name: Tyler Lane MRN: [id number] DOB: 02-18-1950 Today's Date: 07/02/2016 Time: [id number] SLP Time Calculation (min) (ACUTE ONLY): 37 min  Assessment / Plan / Recommendation Clinical Impression  Pt has improved alertness today as compared to previous sessions, and continues to present as a Rancho level V. Today he is more verbal and responsive to questioning, although his responses are marked by language of confusion. Mod cues were provided to follow one-step commands inconsistently. He has immediate coughing that follows ice chips and thin liquids, as well as delayed coughing after bites of puree. Given his improved alertness, recommend proceeding with instrumental testing, which can be completed on CIR as plan is for him to d/c there this afternoon. Until that time, he can have a few, single pieces of ice after oral care to increase use of swallowing musculature and facilitate hydration of oral mucosa.    HPI HPI: Patient is a 66 y.o. male admitted after fall from 10 foot ladder, suffering TBI, SAH, right clavicle fracture, right superior rami fracture. MR Brain revealed TBI with multiple microhemorrhages of right frontal lobe, left occipital lobe and right thalamus. MD also reported "widespread axonal injury and moderate cortical dysfunction."      SLP Plan  Continue with current plan of care       Recommendations  Diet recommendations: NPO;Other(comment) (few single ice chips after oral care) Medication Administration: Via alternative means                Oral Care Recommendations: Oral care QID;Oral care prior to ice chip/H20 Follow up Recommendations: Inpatient Rehab SLP Visit Diagnosis: Dysphagia, unspecified (R13.10);Cognitive communication deficit (Y78.295(R41.841) Plan: Continue with current plan of care       GO                Tyler Lane, Tyler Lane 07/02/2016, 4:08 PM  Tyler HamLaura  Lane, M.A. CCC-SLP [phone]

## 2016-07-02 NOTE — Progress Notes (Signed)
On callDelle Lane( Pamela Love, [place]) informed of CXR result will start TF

## 2016-07-02 NOTE — Progress Notes (Signed)
Rehab admissions - [facility] acute [facility] rehab admission for today.  I spoke with Dr. Janee Mornhompson and I have medical clearance for inpatient rehab admission for today.  Bed available and will admit to acute inpatient rehab today.  Call me for questions.  #[phone]

## 2016-07-02 NOTE — PMR Pre-admission (Signed)
PMR Admission Coordinator Pre-Admission Assessment  Patient: Tyler Lane is an 66 y.o., male MRN: [id number] DOB: 1951/01/30 Height: _0  (180.3 cm) Weight: 74.3 kg (163 lb 12.8 oz)              Insurance Information HMO: Yes   PPO:       PCP:       IPA:       80/20:       OTHER:  Group # [id number] PRIMARY: [employer]      Policy#: [id number]      Subscriber:  Dannielle Burn CM Name: Mikeal Hawthorne      Phone#: [phone]     Fax#:  [phone] Pre-Cert#: [id number] from 07/02/16 with weekly updates due from 07/09/16      Employer:  Retired Benefits:  Phone #: [phone]     Name:  yu-Sam Eff. Date: 02/13/16     Deduct:  $0      Out of Pocket Max: $5900 (met $0)      Life Max:  N/A CIR: $295 days 1-6      SNF:  $0 days 1-20; $167 days 21-100 Outpatient:  Medical necessity     Co-Pay: $10/visit Home Health: 100%      Co-Pay: none DME: 80%     Co-Pay: 20% Providers: in network  Medicaid Application Date:        Case Manager:   Disability Application Date:        Case Worker:    Emergency Facilities manager Information    Name Relation Home Work Mobile   [place] Significant other [place]   Derby,[name] Daughter [phone]     Tymir, Terral    [phone]     Current Medical History  Patient Admitting Diagnosis: Traumatic brain injury, 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 He was found down with GCS 8 at admission. CT head done showing mild acute SAH along sylvian fissures bilaterally, at high left parietal lobe and righ frontal lobe. Work up also revealed displaced fracture of right mid clavicle, right 2nd to 6 rib fractures with pulmonary contusion, contusion, mildly displaced comminuted fracture of right pubic ramus and comminuted transverse fracture of proximal right pubic  ramus and compression deformity of 6th and 7th thoracic vertebral body of uncertain etiology. Patient with aphasia and Dr.Stern question stroke. 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 EEG showed moderate generalized slowing due to non specific etiology. Neurology felt patient with DAI--moderate to severe traumatic brain injury affecting mentationand recommended amantadine for activation.   Ortho/Dr. Percell Miller consulted for input and recommended WBAT BLE and RUE with sling of comfort. 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 He was started on Rocephin for RLL PNA. Cortack placed for nutritional support yesterday andPT/OT evaluations. He continues to have bouts of agitation, difficulty following simple commands, able to answer basic Y/N questions with one word answers and requiring total assist with basic ADL tasks and mobility. Currently Rancho IV emerging Rancho V. CIR recommended for follow up therapy.  :  Past Medical History  Past Medical History:  Diagnosis Date  .  Depression   . Heart murmur    previously  . High cholesterol   . HOH (hard of hearing)    mild in the right and moderate to severe in the left  . Hypertension   . Tinnitus of left ear     Family History  family history includes Diabetes in his mother; Heart attack in his father.  Prior Rehab/Hospitalizations: [facility] previous rehab  Has the patient had major surgery during 100 days prior to admission? Lane  Current Medications   Current Facility-Administered Medications:  .  0.9 %  sodium chloride infusion, , Intravenous, Continuous, Judeth Horn, MD, Last Rate: 20 mL/hr at 07/02/16 1230 .   acetaminophen (TYLENOL) solution 650 mg, 650 mg, Per Tube, Q6H PRN, Georganna Skeans, MD, 650 mg at 06/29/16 0033 .  acetaminophen (TYLENOL) suppository 650 mg, 650 mg, Rectal, Q4H PRN, Kristeen Miss, MD, 650 mg at 06/24/16 2358 .  amantadine (SYMMETREL) 50 MG/5ML solution 100 mg, 100 mg, Per Tube, BID, Judeth Horn, MD, 100 mg at 07/02/16 0908 .  aspirin chewable tablet 81 mg, 81 mg, Per Tube, Daily, Judeth Horn, MD, 81 mg at 07/02/16 0905 .  bethanechol (URECHOLINE) tablet 25 mg, 25 mg, Per Tube, TID, Georganna Skeans, MD, 25 mg at 07/02/16 0905 .  cefTRIAXone (ROCEPHIN) 2 g in dextrose 5 % 50 mL IVPB, 2 g, Intravenous, Q24H, Focht, Jessica L, PA, Stopped at 07/02/16 1300 .  chlorhexidine (PERIDEX) 0.12 % solution 15 mL, 15 mL, Mouth Rinse, BID, Coralie Keens, MD, 15 mL at 07/02/16 0900 .  feeding supplement (PIVOT 1.5 CAL) liquid 1,000 mL, 1,000 mL, Per Tube, Q24H, Judeth Horn, MD, Last Rate: 60 mL/hr at 07/02/16 0300, 1,000 mL at 07/02/16 0300 .  free water 200 mL, 200 mL, Per Tube, Q8H, Judeth Horn, MD, 200 mL at 07/01/16 1400 .  hydrALAZINE (APRESOLINE) injection 10 mg, 10 mg, Intravenous, Q4H PRN, Georganna Skeans, MD, 10 mg at 06/29/16 1019 .  HYDROcodone-acetaminophen (HYCET) 7.5-325 mg/15 ml solution 15 mL, 15 mL, Per Tube, Q4H PRN, Georganna Skeans, MD, 15 mL at 07/01/16 0954 .  LORazepam (ATIVAN) injection 0.5-1 mg, 0.5-1 mg, Intravenous, Q4H PRN, Kristeen Miss, MD, 1 mg at 06/26/16 2234 .  MEDLINE mouth rinse, 15 mL, Mouth Rinse, q12n4p, Coralie Keens, MD, 15 mL at 07/01/16 1600 .  metoprolol tartrate (LOPRESSOR) 25 mg/10 mL oral suspension 12.5 mg, 12.5 mg, Per Tube, BID, Georganna Skeans, MD, 12.5 mg at 07/02/16 0905 .  morphine 4 MG/ML injection 2-4 mg, 2-4 mg, Intravenous, Q4H PRN, Judeth Horn, MD, 4 mg at 07/01/16 0811 .  nicotine (NICODERM CQ - dosed in mg/24 hours) patch 21 mg, 21 mg, Transdermal, Daily, Judeth Horn, MD, 21 mg at 07/02/16 0913 .  ondansetron (ZOFRAN)  tablet 4 mg, 4 mg, Oral, Q6H PRN **OR** ondansetron (ZOFRAN) injection 4 mg, 4 mg, Intravenous, Q6H PRN, Coralie Keens, MD .  pravastatin (PRAVACHOL) tablet 40 mg, 40 mg, Oral, q1800, Judeth Horn, MD, 40 mg at 07/01/16 1736  Patients Current Diet: Diet NPO time specified  Precautions / Restrictions Precautions Precautions: Fall Precaution Comments: R shoulder sling Cervical Brace: Hard collar, At all times Other Brace/Splint: has cortrax feeding tube Restrictions Weight Bearing Restrictions: Yes RUE Weight Bearing: Non weight bearing RLE Weight Bearing: Weight bearing as tolerated LLE Weight Bearing: Weight bearing as tolerated Other Position/Activity Restrictions:  (for UE clavical fx)   Has the patient had 2 or more falls or a fall with injury in the past year?Lane  Prior  Activity Level Community (5-7x/wk): Went out daily, was driving, goes to garage shop daily and hangs out with the guys.  Home Assistive Devices / Equipment Home Assistive Devices/Equipment: None  Prior Device Use: Indicate devices/aids used by the patient prior to current illness, exacerbation or injury? 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  Prior to that, he worked as a Landscape architect Care: Did the patient need help bathing, dressing, using the toilet or eating?  Independent  Indoor Mobility: Did the patient need assistance with walking from room to room (with or without device)? Independent  Stairs: Did the patient need assistance with internal or external stairs (with or without device)? Independent  Functional Cognition: Did the patient need help planning regular tasks such as shopping or remembering to take medications? Independent  Current Functional Level Cognition  Arousal/Alertness: Lethargic Overall Cognitive Status: Impaired/Different from baseline Current Attention  Level: Focused Orientation Level: Oriented to person, Disoriented to place, Disoriented to time, Disoriented to situation Following Commands: Follows one step commands inconsistently Safety/Judgement: Decreased awareness of safety, Decreased awareness of deficits General Comments: Pt recognizes his daughter and calls her by name. States he is from [place]. Requires mod redirectional vc Attention: Focused Focused Attention: Impaired Focused Attention Impairment: Verbal basic, Functional basic Memory: Impaired Awareness: Impaired Awareness Impairment: Intellectual impairment Problem Solving: Impaired Problem Solving Impairment: Verbal basic, Functional basic Safety/Judgment: Impaired Rancho Los Amigos Scales of Cognitive Functioning: Confused/inappropriate/non-agitated    Extremity Assessment (includes Sensation/Coordination)  Upper Extremity Assessment: RUE deficits/detail, LUE deficits/detail RUE Deficits / Details: Pt spontaneously moving bil. UEs.  Pt with Rt clavicle fracture so Rt shoulder not formally assessed as awaiting activity/ROM clarification from ortho   Lower Extremity Assessment: Defer to PT evaluation    ADLs  Overall ADL's : Needs assistance/impaired Eating/Feeding: Maximal assistance Eating/Feeding Details (indicate cue type and reason): Pt trialed ice chips with SLP.  Pt able to hold spoon in Lt hand with min A and required mod a to move spoon to mouth for 4-5 single bites with prompting  Grooming: Wash/dry hands, Wash/dry face, Minimal assistance, Sitting (EOB) Upper Body Bathing: Maximal assistance, Sitting Lower Body Bathing: Maximal assistance, Sit to/from stand Upper Body Dressing : Maximal assistance, Sitting Lower Body Dressing: Bed level, Maximal assistance Toilet Transfer: Total assistance Toilet Transfer Details (indicate cue type and reason): unable  Toileting- Clothing Manipulation and Hygiene: Total assistance Toileting - Clothing Manipulation Details  (indicate cue type and reason): foley; incontinent BM Functional mobility during ADLs: Maximal assistance, +2 for physical assistance, Cueing for safety, Cueing for sequencing General ADL Comments: Pt unable to engage in ADLs today     Mobility  Overal bed mobility: Needs Assistance Bed Mobility: Rolling, Sidelying to Sit Rolling: Mod assist Sidelying to sit: Max assist, +2 for physical assistance Supine to sit: +2 for physical assistance, Total assist Sit to supine: +2 for physical assistance, Max assist General bed mobility comments: assist for bringing legs off EOB and for lifting trunk even after cues and increased time to push up with L UE (internally distracted due to pain)    Transfers  Overall transfer level: Needs assistance Equipment used: 2 person hand held assist Transfers: Sit to/from Stand, Stand Pivot Transfers Sit to Stand: Mod assist, +2 physical assistance Stand pivot transfers: +2 physical assistance, Max assist General transfer comment: slow to initiate, but did come forward eventually, but limited participation once in pain and  max A of 2 to pivot to chair    Ambulation / Gait / Stairs / Office manager / Balance Dynamic Sitting Balance Sitting balance - Comments: able to sit EOB unaided, kept head to L until cues to look to R Balance Overall balance assessment: Needs assistance Sitting-balance support: Feet supported, Single extremity supported Sitting balance-Leahy Scale: Fair Sitting balance - Comments: able to sit EOB unaided, kept head to L until cues to look to R Postural control: Left lateral lean Standing balance support: Bilateral upper extremity supported Standing balance-Leahy Scale: Poor Standing balance comment: unable to achieve full upright standing. Strong posterior bias    Special needs/care consideration BiPAP/CPAP Lane CPM Lane Continuous Drip IV 0.9% NS 20 mL/hr Dialysis Lane        Life Vest Lane Oxygen Lane Special Bed  Lane Trach Size Lane Wound Vac (area) Lane     Skin Lane                          Bowel mgmt: Last BM 07/02/16 with incontinence Bladder mgmt: Condom catheter in place Diabetic mgmt Lane    Previous Home Environment Living Arrangements: Spouse/significant other Available Help at Discharge: [place]: Lane Additional Comments: Pt lived with spouse   Discharge Living Setting Plans for Discharge Living Setting: House, Lives with (comment) (Lives with significant other.) Type of Home at Discharge: House Discharge Home Layout: Two level, Able to live on main level with bedroom/bathroom Alternate Level Stairs-Number of Steps: Flight Discharge Home Access: Stairs to enter Technical brewer of Steps: 1 step to porch and 1 step into house Does the patient have any problems obtaining your medications?: Lane  Social/Family/Support Systems Patient Roles: Parent, Other (Comment) (Has 2 daughters and a signigicant other.) Contact Information: Marjory Lies - daughter - [phone] Anticipated Caregiver: Daughter and SO Anticipated Caregiver's Contact Information: [name] - SO - (h) [phone] (c) [phone] Ability/Limitations of Caregiver: Dtr works from home M-F 4 pm to 7:30 am and SO works evenings.  Dtr and SO to share caregiver duties and may need to change their work schedule to accommodate assistance for patient. Caregiver Availability: 24/7 (Dtr says she and Horris Latino will arrange to stay as needed.) Discharge Plan Discussed with Primary Caregiver: Yes Is Caregiver In Agreement with Plan?: Yes Does Caregiver/Family have Issues with Lodging/Transportation while Pt is in Rehab?: Lane  Goals/Additional Needs Patient/Family Goal for Rehab: PT/OT/SLP supervision to min assist goals Expected length of stay: 21-25 days Cultural Considerations: None Dietary Needs: NPO with cortrak tube feedings Equipment Needs: TBD Pt/Family Agrees to Admission and willing to participate:  Yes Program Orientation Provided & Reviewed with Pt/Caregiver Including Roles  & Responsibilities: Yes  Decrease burden of Care through IP rehab admission: N/A  Possible need for SNF placement upon discharge: Yes, if patient does not progress to point where family can manage from home setting  Patient Condition: This patient's medical and functional status has changed since the consult dated: 06/26/16 in which the Rehabilitation Physician determined and documented that the patient's condition is appropriate for intensive rehabilitative care in an inpatient rehabilitation facility. See "History of Present Illness" (above) for medical update. Functional changes are: Currently requiring mod to max assist for transfers and ADLs. Patient's medical and functional status update has been discussed with the Rehabilitation physician and patient remains appropriate for inpatient rehabilitation. Will admit to inpatient rehab today.  Preadmission Screen Completed By:  Retta Diones, 07/02/2016 3:53 PM ______________________________________________________________________   Discussed status with Dr. Posey Pronto on 07/02/16 at 1553 and received telephone approval for admission today.  Admission Coordinator:  Retta Diones, time 1553/Date 07/02/16

## 2016-07-02 NOTE — Progress Notes (Signed)
  Subjective: CC - doing better  Objective: Vital signs in last 24 hours: Temp:  [98.4 F (36.9 C)-99.5 F (37.5 C)] 99.5 F (37.5 C) (05/21 0817) Pulse Rate:  [69-88] 78 (05/21 0817) Resp:  [15-22] 19 (05/21 0817) BP: (144-158)/(92-100) 147/100 (05/21 0817) SpO2:  [99 %-100 %] 100 % (05/21 0817) Weight:  [74.3 kg (163 lb 12.8 oz)] 74.3 kg (163 lb 12.8 oz) (05/21 0443) Last BM Date: 06/29/16  Intake/Output from previous day: 05/20 0701 - 05/21 0700 In: 2360 [I.V.:540; NG/GT:1820] Out: 1400 [Urine:1400] Intake/Output this shift: No intake/output data recorded.  General appearance: cooperative Nose: Cortrak with bridle Resp: clear to auscultation bilaterally and after cough Cardio: regular rate and rhythm GI: soft, NT, ND  Neuro: PERL, F/C, speech more clear, not oriented to place  Lab Results: CBC  No results for input(s): WBC, HGB, HCT, PLT in the last 72 hours. BMET  Recent Labs  06/30/16 0258  NA 143  K 4.3  CL 115*  CO2 18*  GLUCOSE 116*  BUN 30*  CREATININE 0.78  CALCIUM 8.4*   PT/INR No results for input(s): LABPROT, INR in the last 72 hours. ABG No results for input(s): PHART, HCO3 in the last 72 hours.  Invalid input(s): PCO2, PO2  Studies/Results: No results found.  Anti-infectives: Anti-infectives    Start     Dose/Rate Route Frequency Ordered Stop   06/25/16 1200  cefTRIAXone (ROCEPHIN) 2 g in dextrose 5 % 50 mL IVPB     2 g 100 mL/hr over 30 Minutes Intravenous Every 24 hours 06/25/16 1124        Assessment/Plan: Fall from ladder TBI w/ SAH andintraventricular hemorrhage  - EEG showed moderategeneralized slowing of brain activity and MRI showed traumatic parenchymal brain injury with multiple microhemorrhages - neurosurgery and neurology following - Amantadine per neurology - TBI team therapies Right clavicle fracture - WBAT BLE, Sling for RUE when out of bed per Dr. Eulah PontMurphy  right posterior 2-6 rib fractures with contusion and  tiny PTX -pulmonary toilet ID- Rocephin for staph HCAP - if WBC WNL will D/C rocephin after today Acute urinary retention- onurecholine, doing well with condom cath now HTN- add low dose lopressor R displaced comminuted sacrum fractureand R pubic rami fracture - WBAT per Dr. Eulah PontMurphy C spine cleared today FEN: Speech therapy following; con't TFs VTE: SCD's, no anticoagulation or antiplatelet 3 weeks per neurology Dispo- floor, CIR  I spoke with his daughter at the bedside   LOS: 11 days    Violeta GelinasBurke Kahmari Koller, MD, MPH, FACS Trauma: [phone] General Surgery: [phone]  5/21/2018Patient ID: Tyler Lane, male   DOB: [birth year], 66 y.o.   MRN: [id number]

## 2016-07-02 NOTE — Care Management Important Message (Signed)
Important Message  Patient Details  Name: Jannet AskewDavid Wrage MRN: [id number] Date of Birth: 1950/04/19   Medicare Important Message Given:  Yes    Hiro Vipond Abena 07/02/2016, 1:03 PM

## 2016-07-02 NOTE — Progress Notes (Signed)
PT Note Seen second session due to pt sliding out of chair and needing assist for hygiene; assist with nurse tech back to bed as per note below.    07/02/16 1615  PT Visit Information  Assistance Needed +2  History of Present Illness 66 y.o. male admitted after fall from 10 foot ladder, suffering TBI, SAH, right clavicle fracture, right superior rami fracture. MR Brain revealed TBI with multiple microhemorrhages of right frontal lobe, left occipital lobe and right thalamus. 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   Precautions  Precautions Fall  Precaution Comments R shoulder sling  Restrictions  Weight Bearing Restrictions Yes  RUE Weight Bearing NWB  RLE Weight Bearing WBAT  LLE Weight Bearing WBAT  Pain Assessment  Faces Pain Scale 8  Pain Location during transfer was limited in tolerance to weight bearing esp on L LE  Pain Descriptors / Indicators Grimacing;Guarding  Cognition  Arousal/Alertness Awake/alert  Behavior During Therapy Flat affect  Overall Cognitive Status Impaired/Different from baseline  Area of Impairment Attention;Memory;Following commands;Orientation;Safety/judgement;Awareness;Problem solving;Rancho level;JFK Recovery Scale  Orientation Level Disoriented to;Place;Time;Situation  Current Attention Level Focused  Memory Decreased short-term memory  Following Commands Follows one step commands inconsistently  Safety/Judgement Decreased awareness of safety;Decreased awareness of deficits  Awareness Intellectual  Problem Solving Slow processing  Rancho Levels of Cognitive Functioning  Rancho Los Amigos Scales of Cognitive Functioning V  Bed Mobility  Overal bed mobility Needs Assistance  Bed Mobility Sit to Supine  Supine to sit Max assist;+2 for physical assistance  General bed mobility comments assist for shoulders and legs   Transfers  Overall transfer level Needs  assistance  Equipment used None  Transfers Sit to/from Stand;Stand Pivot Transfers  Sit to Stand Max assist  Stand pivot transfers Max assist  General transfer comment patient in chair and slid down and soiled with BM; stood with assist to keep from using R UE and tech assisted with hygiene and donning brief, pt at times with LOB and max A for balance, but mostly mod A; pivot with max A as pt not wanting to move his feet  PT - End of Session  Equipment Utilized During Treatment Gait belt  Activity Tolerance Patient limited by pain  Patient left in bed;with call bell/phone within reach;with nursing/sitter in room;with family/visitor present  PT - Assessment/Plan  PT Plan Current plan remains appropriate  PT Visit Diagnosis Difficulty in walking, not elsewhere classified (R26.2);Other symptoms and signs involving the nervous system (R29.898);Pain  Pain - Right/Left Right  Pain - part of body Shoulder  PT Frequency (ACUTE ONLY) Min 3X/week  Follow Up Recommendations CIR  PT equipment Wheelchair (measurements PT);Wheelchair cushion (measurements PT);Hospital bed  AM-PAC PT "6 Clicks" Daily Activity Outcome Measure  Difficulty turning over in bed (including adjusting bedclothes, sheets and blankets)? 1  Difficulty moving from lying on back to sitting on the side of the bed?  1  Difficulty sitting down on and standing up from a chair with arms (e.g., wheelchair, bedside commode, etc,.)? 1  Help needed moving to and from a bed to chair (including a wheelchair)? 2  Help needed walking in hospital room? 1  Help needed climbing 3-5 steps with a railing?  1  6 Click Score 7  Mobility G Code  CM  PT Goal Progression  Progress towards PT goals Progressing toward goals  PT Time Calculation  PT Start Time (ACUTE ONLY) 1217  PT Stop  Time (ACUTE ONLY) 1232  PT Time Calculation (min) (ACUTE ONLY) 15 min  PT Treatments  $Therapeutic Activity 8-22 mins  [place], Odell [phone] 07/02/2016

## 2016-07-02 NOTE — Progress Notes (Signed)
Retta Diones, RN Rehab Admission Coordinator Signed Physical Medicine and Rehabilitation  PMR Pre-admission Date of Service: 07/02/2016 3:39 PM  Related encounter: ED to Hosp-Admission (Current) from 06/21/2016 in [place]       [] Hide copied text PMR Admission Coordinator Pre-Admission Assessment  Patient: Tyler Lane is an 66 y.o., male MRN: [id number] DOB: 09/17/1950 Height: 5' 11"  (180.3 cm) Weight: 74.3 kg (163 lb 12.8 oz)                                                                                                                                                  Insurance Information HMO: Yes   PPO:       PCP:       IPA:       80/20:       OTHER:  Group # [id number] PRIMARY: [employer]      Policy#: [id number]      Subscriber:  Dannielle Burn CM Name: Mikeal Hawthorne      Phone#: [phone]     Fax#:  [phone] Pre-Cert#: [id number] from 07/02/16 with weekly updates due from 07/09/16      Employer:  Retired Benefits:  Phone #: [phone]     Name:  yu-Sam Eff. Date: 02/13/16     Deduct:  $0      Out of Pocket Max: $5900 (met $0)      Life Max:  N/A CIR: $295 days 1-6      SNF:  $0 days 1-20; $167 days 21-100 Outpatient:  Medical necessity     Co-Pay: $10/visit Home Health: 100%      Co-Pay: none DME: 80%     Co-Pay: 20% Providers: in network  Medicaid Application Date:        Case Manager:   Disability Application Date:        Case Worker:    Emergency Contact Information        Contact Information    Name Relation Home Work Mobile   [place] Significant other [place]   Derby,[name] Daughter [phone]     Winner, Valeriano    [phone]     Current Medical History  Patient Admitting Diagnosis:Traumatic brain injury, 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 He was found down with GCS 8 at admission. CT head done showing mild acute SAH along sylvian fissures bilaterally, at high left parietal lobe and righ frontal lobe. Work up also revealed displaced fracture of right mid clavicle, right 2nd to 6 rib fractures with pulmonary contusion, contusion, mildly displaced comminuted fracture of right pubic ramus and comminuted transverse fracture of proximal right pubic ramus and compression deformity of 6th and 7th  thoracic vertebral body of uncertain etiology. Patient with aphasia and Dr.Stern question stroke. 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 EEG showed moderate generalized slowing due to non specific etiology. Neurology felt patient with DAI--moderate to severe traumatic brain injury affecting mentationand recommended amantadine for activation.   Ortho/Dr. Percell Miller consulted for input and recommended WBAT BLE and RUE with sling of comfort. 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 He was started on Rocephin for RLL PNA. Cortack placed for nutritional support yesterday andPT/OT evaluations. He continues to have bouts of agitation, difficulty following simple commands, able to answer basic Y/N questions with one word answers and requiring total assist with basic ADL tasks and mobility. Currently Rancho IV emerging Rancho V. CIR recommended for follow up therapy.  :  Past Medical History      Past Medical History:  Diagnosis Date  . Depression   . Heart murmur    previously  . High cholesterol   . HOH (hard of hearing)    mild in the right and moderate to severe in the left  .  Hypertension   . Tinnitus of left ear     Family History  family history includes Diabetes in his mother; Heart attack in his father.  Prior Rehab/Hospitalizations: No previous rehab  Has the patient had major surgery during 100 days prior to admission? No  Current Medications   Current Facility-Administered Medications:  .  0.9 %  sodium chloride infusion, , Intravenous, Continuous, Judeth Horn, MD, Last Rate: 20 mL/hr at 07/02/16 1230 .  acetaminophen (TYLENOL) solution 650 mg, 650 mg, Per Tube, Q6H PRN, Georganna Skeans, MD, 650 mg at 06/29/16 0033 .  acetaminophen (TYLENOL) suppository 650 mg, 650 mg, Rectal, Q4H PRN, Kristeen Miss, MD, 650 mg at 06/24/16 2358 .  amantadine (SYMMETREL) 50 MG/5ML solution 100 mg, 100 mg, Per Tube, BID, Judeth Horn, MD, 100 mg at 07/02/16 0908 .  aspirin chewable tablet 81 mg, 81 mg, Per Tube, Daily, Judeth Horn, MD, 81 mg at 07/02/16 0905 .  bethanechol (URECHOLINE) tablet 25 mg, 25 mg, Per Tube, TID, Georganna Skeans, MD, 25 mg at 07/02/16 0905 .  cefTRIAXone (ROCEPHIN) 2 g in dextrose 5 % 50 mL IVPB, 2 g, Intravenous, Q24H, Focht, Jessica L, PA, Stopped at 07/02/16 1300 .  chlorhexidine (PERIDEX) 0.12 % solution 15 mL, 15 mL, Mouth Rinse, BID, Coralie Keens, MD, 15 mL at 07/02/16 0900 .  feeding supplement (PIVOT 1.5 CAL) liquid 1,000 mL, 1,000 mL, Per Tube, Q24H, Judeth Horn, MD, Last Rate: 60 mL/hr at 07/02/16 0300, 1,000 mL at 07/02/16 0300 .  free water 200 mL, 200 mL, Per Tube, Q8H, Judeth Horn, MD, 200 mL at 07/01/16 1400 .  hydrALAZINE (APRESOLINE) injection 10 mg, 10 mg, Intravenous, Q4H PRN, Georganna Skeans, MD, 10 mg at 06/29/16 1019 .  HYDROcodone-acetaminophen (HYCET) 7.5-325 mg/15 ml solution 15 mL, 15 mL, Per Tube, Q4H PRN, Georganna Skeans, MD, 15 mL at 07/01/16 0954 .  LORazepam (ATIVAN) injection 0.5-1 mg, 0.5-1 mg, Intravenous, Q4H PRN, Kristeen Miss, MD, 1 mg at 06/26/16 2234 .  MEDLINE mouth rinse, 15 mL, Mouth Rinse,  q12n4p, Coralie Keens, MD, 15 mL at 07/01/16 1600 .  metoprolol tartrate (LOPRESSOR) 25 mg/10 mL oral suspension 12.5 mg, 12.5 mg,  Per Tube, BID, Georganna Skeans, MD, 12.5 mg at 07/02/16 0905 .  morphine 4 MG/ML injection 2-4 mg, 2-4 mg, Intravenous, Q4H PRN, Judeth Horn, MD, 4 mg at 07/01/16 0811 .  nicotine (NICODERM CQ - dosed in mg/24 hours) patch 21 mg, 21 mg, Transdermal, Daily, Judeth Horn, MD, 21 mg at 07/02/16 0913 .  ondansetron (ZOFRAN) tablet 4 mg, 4 mg, Oral, Q6H PRN **OR** ondansetron (ZOFRAN) injection 4 mg, 4 mg, Intravenous, Q6H PRN, Coralie Keens, MD .  pravastatin (PRAVACHOL) tablet 40 mg, 40 mg, Oral, q1800, Judeth Horn, MD, 40 mg at 07/01/16 1736  Patients Current Diet: Diet NPO time specified  Precautions / Restrictions Precautions Precautions: Fall Precaution Comments: R shoulder sling Cervical Brace: Hard collar, At all times Other Brace/Splint: has cortrax feeding tube Restrictions Weight Bearing Restrictions: Yes RUE Weight Bearing: Non weight bearing RLE Weight Bearing: Weight bearing as tolerated LLE Weight Bearing: Weight bearing as tolerated Other Position/Activity Restrictions:  (for UE clavical fx)   Has the patient had 2 or more falls or a fall with injury in the past year?No  Prior Activity Level Community (5-7x/wk): Went out daily, was driving, goes to garage shop daily and hangs out with the guys.  Home Assistive Devices / Equipment Home Assistive Devices/Equipment: None  Prior Device Use: Indicate devices/aids used by the patient prior to current illness, exacerbation or injury? 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  Prior to that, he worked as a Landscape architect Care: Did the patient need help bathing, dressing, using the toilet or eating?  Independent  Indoor Mobility: Did the patient need assistance  with walking from room to room (with or without device)? Independent  Stairs: Did the patient need assistance with internal or external stairs (with or without device)? Independent  Functional Cognition: Did the patient need help planning regular tasks such as shopping or remembering to take medications? Independent  Current Functional Level Cognition  Arousal/Alertness: Lethargic Overall Cognitive Status: Impaired/Different from baseline Current Attention Level: Focused Orientation Level: Oriented to person, Disoriented to place, Disoriented to time, Disoriented to situation Following Commands: Follows one step commands inconsistently Safety/Judgement: Decreased awareness of safety, Decreased awareness of deficits General Comments: Pt recognizes his daughter and calls her by name. States he is from [place]. Requires mod redirectional vc Attention: Focused Focused Attention: Impaired Focused Attention Impairment: Verbal basic, Functional basic Memory: Impaired Awareness: Impaired Awareness Impairment: Intellectual impairment Problem Solving: Impaired Problem Solving Impairment: Verbal basic, Functional basic Safety/Judgment: Impaired Rancho Los Amigos Scales of Cognitive Functioning: Confused/inappropriate/non-agitated    Extremity Assessment (includes Sensation/Coordination)  Upper Extremity Assessment: RUE deficits/detail, LUE deficits/detail RUE Deficits / Details: Pt spontaneously moving bil. UEs.  Pt with Rt clavicle fracture so Rt shoulder not formally assessed as awaiting activity/ROM clarification from ortho   Lower Extremity Assessment: Defer to PT evaluation    ADLs  Overall ADL's : Needs assistance/impaired Eating/Feeding: Maximal assistance Eating/Feeding Details (indicate cue type and reason): Pt trialed ice chips with SLP.  Pt able to hold spoon in Lt hand with min A and required mod a to move spoon to mouth for 4-5 single bites with prompting  Grooming: Wash/dry  hands, Wash/dry face, Minimal assistance, Sitting (EOB) Upper Body Bathing: Maximal assistance, Sitting Lower Body Bathing: Maximal assistance, Sit to/from stand Upper Body Dressing : Maximal assistance, Sitting Lower Body Dressing: Bed level, Maximal assistance Toilet Transfer: Total assistance Toilet Transfer Details (  indicate cue type and reason): unable  Toileting- Clothing Manipulation and Hygiene: Total assistance Toileting - Clothing Manipulation Details (indicate cue type and reason): foley; incontinent BM Functional mobility during ADLs: Maximal assistance, +2 for physical assistance, Cueing for safety, Cueing for sequencing General ADL Comments: Pt unable to engage in ADLs today     Mobility  Overal bed mobility: Needs Assistance Bed Mobility: Rolling, Sidelying to Sit Rolling: Mod assist Sidelying to sit: Max assist, +2 for physical assistance Supine to sit: +2 for physical assistance, Total assist Sit to supine: +2 for physical assistance, Max assist General bed mobility comments: assist for bringing legs off EOB and for lifting trunk even after cues and increased time to push up with L UE (internally distracted due to pain)    Transfers  Overall transfer level: Needs assistance Equipment used: 2 person hand held assist Transfers: Sit to/from Stand, Stand Pivot Transfers Sit to Stand: Mod assist, +2 physical assistance Stand pivot transfers: +2 physical assistance, Max assist General transfer comment: slow to initiate, but did come forward eventually, but limited participation once in pain and max A of 2 to pivot to chair    Ambulation / Gait / Stairs / Wheelchair Mobility       Posture / Balance Dynamic Sitting Balance Sitting balance - Comments: able to sit EOB unaided, kept head to L until cues to look to R Balance Overall balance assessment: Needs assistance Sitting-balance support: Feet supported, Single extremity supported Sitting balance-Leahy Scale:  Fair Sitting balance - Comments: able to sit EOB unaided, kept head to L until cues to look to R Postural control: Left lateral lean Standing balance support: Bilateral upper extremity supported Standing balance-Leahy Scale: Poor Standing balance comment: unable to achieve full upright standing. Strong posterior bias    Special needs/care consideration BiPAP/CPAP No CPM No Continuous Drip IV 0.9% NS 20 mL/hr Dialysis No        Life Vest No Oxygen No Special Bed No Trach Size No Wound Vac (area) No     Skin No                          Bowel mgmt: Last BM 07/02/16 with incontinence Bladder mgmt: Condom catheter in place Diabetic mgmt No    Previous Home Environment Living Arrangements: Spouse/significant other Available Help at Discharge: [place]: No Additional Comments: Pt lived with spouse   Discharge Living Setting Plans for Discharge Living Setting: House, Lives with (comment) (Lives with significant other.) Type of Home at Discharge: House Discharge Home Layout: Two level, Able to live on main level with bedroom/bathroom Alternate Level Stairs-Number of Steps: Flight Discharge Home Access: Stairs to enter Technical brewer of Steps: 1 step to porch and 1 step into house Does the patient have any problems obtaining your medications?: No  Social/Family/Support Systems Patient Roles: Parent, Other (Comment) (Has 2 daughters and a signigicant other.) Contact Information: Marjory Lies - daughter - [phone] Anticipated Caregiver: Daughter and SO Anticipated Caregiver's Contact Information: [name] - SO - (h) [phone] (c) [phone] Ability/Limitations of Caregiver: Dtr works from home M-F 4 pm to 7:30 am and SO works evenings.  Dtr and SO to share caregiver duties and may need to change their work schedule to accommodate assistance for patient. Caregiver Availability: 24/7 (Dtr says she and Horris Latino will arrange to stay as  needed.) Discharge Plan Discussed with Primary Caregiver: Yes Is Caregiver In Agreement with Plan?: Yes Does Caregiver/Family have  Issues with Lodging/Transportation while Pt is in Rehab?: No  Goals/Additional Needs Patient/Family Goal for Rehab: PT/OT/SLP supervision to min assist goals Expected length of stay: 21-25 days Cultural Considerations: None Dietary Needs: NPO with cortrak tube feedings Equipment Needs: TBD Pt/Family Agrees to Admission and willing to participate: Yes Program Orientation Provided & Reviewed with Pt/Caregiver Including Roles  & Responsibilities: Yes  Decrease burden of Care through IP rehab admission: N/A  Possible need for SNF placement upon discharge: Yes, if patient does not progress to point where family can manage from home setting  Patient Condition: This patient's medical and functional status has changed since the consult dated: 06/26/16 in which the Rehabilitation Physician determined and documented that the patient's condition is appropriate for intensive rehabilitative care in an inpatient rehabilitation facility. See "History of Present Illness" (above) for medical update. Functional changes are: Currently requiring mod to max assist for transfers and ADLs. Patient's medical and functional status update has been discussed with the Rehabilitation physician and patient remains appropriate for inpatient rehabilitation. Will admit to inpatient rehab today.  Preadmission Screen Completed By:  Retta Diones, 07/02/2016 3:53 PM ______________________________________________________________________   Discussed status with Dr. Posey Pronto on 07/02/16 at 1553 and received telephone approval for admission today.  Admission Coordinator:  Retta Diones, time 1553/Date 07/02/16       Cosigned by: Jamse Arn, MD at 07/02/2016 3:58 PM  Revision History

## 2016-07-02 NOTE — Progress Notes (Signed)
Patient's cortak became displaced around 1000; noted in mouth by patient's daughter after he had started coughing. Infusing tube feed turned off immediately by RN. Cortrak team notified x3, waiting on return call. No signs of aspiration noted at present. Dr. Janee Mornhompson notified. Still ok to transfer to regular floor, per MD.  Leanna BattlesEckelmann, Savannah Erbe Eileen, RN

## 2016-07-02 NOTE — Progress Notes (Signed)
Kirsteins, Tyler SparrowAndrew E, MD Physician Signed Physical Medicine and Rehabilitation  Consult Note Date of Service: 06/26/2016 4:08 PM  Related encounter: ED to Hosp-Admission (Current[facility]     Expand All Collapse All   [] Hide copied text      Physical Medicine and Rehabilitation Consult  Reason for Consult: TBI, right pelvic fracture, multiple rib fractures, right clavicle fracture Referring Physician: Dr. Lindie SpruceWyatt   HPI: Tyler AskewDavid Lane is a 66 y.o. male with history of HTN, depression, hearing loss who was admitted on 06/21/16 after unwitnessed fall off 10' ladder. He was found down with GCS 8 at admission. CT head done showing mild acute SAH along sylvian fissures bilaterally, at high left parietal lobe and righ frontal lobe. Work up also revealed displaced fracture of right mid clavicle, right 2nd to 6 rib fractures with pulmonary contusion, contusion, mildly displaced comminuted fracture of right pubic ramus and comminuted transverse fracture of proximal right pubic ramus and compression deformity of 6th and 7th thoracic vertebral body of uncertain etiology. Patient with aphasia and Dr.Stern question stroke. 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 EEG showed moderate generalized slowing due to non specific etiology. 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   Ortho/Dr. Eulah PontMurphy consulted for input and recommended WBAT BLE and RUE with sling of comfort. Patient with  Moderate to severe cognitive linguistic impairment as well as evidence of dysphagia--NPO recommended.  He was started on  Rocephin for RLL PNA. Cortack placed for nutritional support yesterday and PT/OT evaluations. He continues to have bouts of agitation, difficulty following simple commands, able to answer basic Y/N questions with one word answers and requiring total assist with basic ADL tasks and mobility. Currently Rancho IV emerging Rancho V. CIR recommended for follow up therapy.   Discussed with Dr. Lindie SpruceWyatt, waxing and waning cognitive status, may have some sedation from pain medications.   Review of Systems  Unable to perform ROS: Mental acuity          Past Medical History:  Diagnosis Date  . Depression   . Heart murmur    previously  . High cholesterol   . HOH (hard of hearing)    mild in the right and moderate to severe in the left  . Hypertension   . Tinnitus of left ear          Past Surgical History:  Procedure Laterality Date  . NASAL SINUS SURGERY             Family History  Problem Relation Age of Onset  . Diabetes Mother   . Heart attack Father       Social History:  Reports that he's married and works. Per reports that he has been smoking Cigarettes.  He has a 20.00 pack-year smoking history. He has never used smokeless tobacco. Per reports that he does not drink alcohol or use drugs.    Allergies: No Known Allergies          Medications Prior to Admission  Medication Sig Dispense Refill  . aspirin EC 81 MG tablet Take 81 mg by mouth daily.    . calcium carbonate (TUMS - DOSED IN MG ELEMENTAL CALCIUM) 500 MG chewable tablet  Chew 1-2 tablets by mouth daily as needed for indigestion or heartburn.    . citalopram (CELEXA) 40 MG tablet Take 40 mg by mouth daily.    . hydrochlorothiazide (HYDRODIURIL) 25 MG tablet Take 25 mg by mouth daily.    Marland Kitchen ibuprofen (ADVIL,MOTRIN) 200 MG tablet Take 200-800 mg by mouth every 6 (six) hours as needed for mild pain.    Marland Kitchen loratadine (CLARITIN) 10 MG tablet Take 10 mg by mouth daily.    Marland Kitchen  lovastatin (MEVACOR) 20 MG tablet Take 40 mg by mouth daily.      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  He assisted minimally with lowering trunk to bed.  Required assist to move LEs onto bed  Transfers Overall transfer level: Needs assistance Equipment used:  (two person face to face with chuck pad) Transfers: Sit to/from Stand Sit to Stand: Max assist, +2 physical assistance General transfer comment: Max to total assist of 2 to power up to standing, patient in apparent pain and distress bia RLE, unable to maintain weight through legs at this time  ADL: ADL Overall ADL's : Needs assistance/impaired Eating/Feeding: NPO Grooming: Wash/dry hands, Wash/dry face, Oral care, Brushing hair, Total assistance, Bed level Upper Body Bathing: Total assistance, Bed level Lower Body Bathing: Total assistance, Bed level Upper Body Dressing : Total assistance, Bed level Lower Body Dressing: Total assistance, Bed level Toilet Transfer: Total assistance Toilet Transfer Details (indicate cue type and reason): unable  Toileting- Clothing Manipulation and Hygiene: Total assistance, Bed level General ADL Comments: Pt unable to engage in ADLs today   Cognition: Cognition Overall Cognitive Status: Impaired/Different from baseline Arousal/Alertness:  Lethargic Orientation Level: Oriented to person, Disoriented to place, Disoriented to time, Disoriented to situation Attention: Focused Focused Attention: Impaired Focused Attention Impairment: Verbal basic, Functional basic Memory: Impaired Awareness: Impaired Awareness Impairment: Intellectual impairment Problem Solving: Impaired Problem Solving Impairment: Verbal basic, Functional basic Safety/Judgment: Impaired Rancho Mirant Scales of Cognitive Functioning: Confused/agitated Cognition Arousal/Alertness: Lethargic Behavior During Therapy: Restless Overall Cognitive Status: Impaired/Different from baseline Area of Impairment: Attention Orientation Level: Disoriented to, Place, Time, Situation Current Attention Level: Focused Following Commands: Follows one step commands inconsistently General Comments: Pt did not follow commands, but does answer occasional simple yes/no questions   Blood pressure (!) 160/104, pulse 94, temperature 98.6 F (37 C), temperature source Axillary, resp. rate (!) 28, height 5\' 11"  (1.803 m), weight 84.4 kg (186 lb 1.1 oz), SpO2 (!) 87 %. Physical Exam  Nursing note and vitals reviewed. Constitutional: He appears well-developed and well-nourished. He appears lethargic.  Restless and moves LUE and BLE intermittently. in bed--slumped to the left with legs dangling off edge. Cortak and bilateral mittens in place. Arouses briefly to sternal rubs.   HENT:  Head: Normocephalic and atraumatic.  Eyes: Conjunctivae are normal.  Neck:  Cervical collar in place.  Cardiovascular: Normal rate and regular rhythm.   Respiratory: Effort normal. No  respiratory distress. He exhibits tenderness.  Congested sounds with crackles throughout lungs.   GI: Soft. Bowel sounds are normal. He exhibits no distension. There is no tenderness.  Musculoskeletal: He exhibits no edema or tenderness.  Onychomycotic  toe nails with erythema around all nail beds.   Neurological: He  appears lethargic.  Oriented to self and DOB. Age "37". Moderate dysarthria and responds to pain. Restless and inattentive. Unable to follow simple motor commands. Decrease movement RUE at shoulder.   Skin: Skin is warm and dry.    Lab Results Last 24 Hours        Results for orders placed or performed during the hospital encounter of 06/21/16 (from the past 24 hour(s))  Glucose, capillary     Status: Abnormal   Collection Time: 06/26/16 11:19 AM  Result Value Ref Range   Glucose-Capillary 135 (H) 65 - 99 mg/dL   Comment 1 Notify RN    Comment 2 Document in Chart   Glucose, capillary     Status: Abnormal   Collection Time: 06/26/16  3:48 PM  Result Value Ref Range   Glucose-Capillary 136 (H) 65 - 99 mg/dL   Comment 1 Notify RN    Comment 2 Document in Chart   Culture, respiratory (NON-Expectorated)     Status: None (Preliminary result)   Collection Time: 06/26/16  5:40 PM  Result Value Ref Range   Specimen Description TRACHEAL ASPIRATE    Special Requests NONE    Gram Stain      ABUNDANT WBC PRESENT, PREDOMINANTLY PMN FEW GRAM POSITIVE COCCI IN PAIRS RARE GRAM NEGATIVE RODS    Culture PENDING    Report Status PENDING   Magnesium     Status: None   Collection Time: 06/26/16  6:17 PM  Result Value Ref Range   Magnesium 2.3 1.7 - 2.4 mg/dL  Phosphorus     Status: Abnormal   Collection Time: 06/26/16  6:17 PM  Result Value Ref Range   Phosphorus 2.3 (L) 2.5 - 4.6 mg/dL  Glucose, capillary     Status: Abnormal   Collection Time: 06/26/16  8:02 PM  Result Value Ref Range   Glucose-Capillary 121 (H) 65 - 99 mg/dL  Glucose, capillary     Status: Abnormal   Collection Time: 06/26/16 11:39 PM  Result Value Ref Range   Glucose-Capillary 154 (H) 65 - 99 mg/dL  CBC     Status: Abnormal   Collection Time: 06/27/16  2:46 AM  Result Value Ref Range   WBC 16.3 (H) 4.0 - 10.5 K/uL   RBC 4.47 4.22 - 5.81 MIL/uL   Hemoglobin 14.3 13.0 - 17.0  g/dL   HCT 16.1 09.6 - 04.5 %   MCV 90.4 78.0 - 100.0 fL   MCH 32.0 26.0 - 34.0 pg   MCHC 35.4 30.0 - 36.0 g/dL   RDW 40.9 81.1 - 91.4 %   Platelets 197 150 - 400 K/uL  Basic metabolic panel     Status: Abnormal   Collection Time: 06/27/16  2:46 AM  Result Value Ref Range   Sodium 143 135 - 145 mmol/L   Potassium 3.0 (L) 3.5 - 5.1 mmol/L   Chloride 115 (H) 101 - 111 mmol/L   CO2 22 22 - 32 mmol/L   Glucose, Bld 131 (H) 65 - 99 mg/dL   BUN 22 (H) 6 - 20 mg/dL   Creatinine, Ser 7.82 0.61 - 1.24 mg/dL   Calcium 7.8 (L) 8.9 - 10.3 mg/dL   GFR calc  non Af Amer >60 >60 mL/min   GFR calc Af Amer >60 >60 mL/min   Anion gap 6 5 - 15  Glucose, capillary     Status: Abnormal   Collection Time: 06/27/16  3:36 AM  Result Value Ref Range   Glucose-Capillary 116 (H) 65 - 99 mg/dL  Glucose, capillary     Status: Abnormal   Collection Time: 06/27/16  7:46 AM  Result Value Ref Range   Glucose-Capillary 113 (H) 65 - 99 mg/dL   Comment 1 Notify RN    Comment 2 Document in Chart       Imaging Results (Last 48 hours)  Dg Chest Port 1 View  Result Date: 06/27/2016 CLINICAL DATA:  Respiratory failure, recent chest trauma, history of traumatic brain injury. EXAM: PORTABLE CHEST 1 VIEW COMPARISON:  Chest x-ray of Jun 26, 2016 and chest CT scan of Jun 21, 2016 FINDINGS: The lungs are well-expanded. Infiltrate throughout the right lung has increased. Minimal left basilar density persists. No definite pleural effusion is observed though there may be pleural fluid layering posteriorly on the right. There is no mediastinal shift. The heart and pulmonary vascularity are normal. There is calcification in the wall of the aortic arch. The feeding tube tip projects below the inferior margin of the image. IMPRESSION: Diffuse increased interstitial density throughout the right lung most compatible with known ammonia. Possible posterior layering of pleural effusion on the right. Asymmetric  pulmonary edema is felt less likely. Minimal left basilar atelectasis is suspected. Thoracic aortic atherosclerosis. Electronically Signed   By: Emett  [place] M.D.   On: 06/27/2016 07:28   Dg Chest Port 1 View  Result Date: 06/26/2016 CLINICAL DATA:  Fever. EXAM: PORTABLE CHEST 1 VIEW COMPARISON:  06/24/2016 .  06/23/2016. FINDINGS: Feeding tube noted with tip below left hemidiaphragm. Heart size stable. Diffuse right lung infiltrate, progressive from prior exam. Mild left base infiltrate. Small right pleural effusion again cannot be excluded . No pneumothorax. Heart size stable. Multiple right ribs and right clavicular fractures noted. IMPRESSION: 1.  Feeding tube noted with tip below left hemidiaphragm. 2. Diffuse right lung infiltrate, progressive prior exam. Mild left base infiltrate is also noted. Small right pleural effusion again cannot be excluded . Electronically Signed   By: Maisie Fus  Register   On: 06/26/2016 07:34     Assessment/Plan: Diagnosis: Traumatic brain injury, multitrauma due to fall off ladder 1. Does the need for close, 24 hr/day medical supervision in concert with the patient's rehab needs make it unreasonable for this patient to be served in a less intensive setting? Yes 2. Co-Morbidities requiring supervision/potential complications: Right clavicular fracture, right lower lobe pneumonia, right second to sixth rib fracture, right pubic ramus fracture 3. Due to bladder management, bowel management, safety, skin/wound care, disease management, medication administration, pain management and patient education, does the patient require 24 hr/day rehab nursing? Yes 4. Does the patient require coordinated care of a physician, rehab nurse, PT (1-2 hrs/day, 5 days/week), OT (1-2 hrs/day, 5 days/week) and SLP (.5-1 hrs/day, 5 days/week) to address physical and functional deficits in the context of the above medical diagnosis(es)? Yes Addressing deficits in the following areas: balance,  endurance, locomotion, strength, transferring, bowel/bladder control, bathing, dressing, feeding, grooming, toileting, cognition, speech, language, swallowing and psychosocial support 5. Can the patient actively participate in an intensive therapy program of at least 3 hrs of therapy per day at least 5 days per week? No 6. The potential for patient to make measurable gains while on  inpatient rehab is fair 7. Anticipated functional outcomes upon discharge from inpatient rehab are supervision and min assist  with PT, supervision and min assist with OT, supervision and min assist with SLP. 8. Estimated rehab length of stay to reach the above functional goals is: 21-25d 9. Anticipated D/C setting: Home 10. Anticipated post D/C treatments: HH therapy 11. Overall Rehab/Functional Prognosis: good  RECOMMENDATIONS: This patient's condition is appropriate for continued rehabilitative care in the following setting: CIR Patient has agreed to participate in recommended program. N/A Note that insurance prior authorization may be required for reimbursement for recommended care.  Comment: As discussed with Dr. Lindie Spruce, not ready to tolerate therapy but should be ready in 2-3 days  Erick Colace M.D. [facility] FAAPM&R (Sports Med, Neuromuscular Med) Diplomate Am Board of Electrodiagnostic Med  Jerene Pitch 06/27/2016    Revision History

## 2016-07-02 NOTE — Care Management Note (Signed)
Case Management Note  Patient Details  Name: Tyler AskewDavid Wentland MRN: [id number] Date of Birth: 12/31/1950  Subjective/Objective:   Pt admitted on 06/21/16 after fall from a 10 foot ladder with TBI, SAH, Rt clavicle fx, and Rt superior rami fx.  PTA, pt independent of ADLS, lives with significant other of many years; has supportive daughter.                   Action/Plan: TBI team to see; will follow as pt progresses.    Expected Discharge Date:    07/02/16              Expected Discharge Plan:  IP Rehab Facility  In-House Referral:     Discharge planning Services  CM Consult  Post Acute Care Choice:    Choice offered to:     DME Arranged:    DME Agency:     HH Arranged:    HH Agency:     Status of Service:  Completed, signed off  If discussed at [employer] Meetings, dates discussed:    Additional Comments:  06/26/16 J. Ariauna Farabee, RN, BSN Bedrest order now lifted; therapies now working with pt.  Pt currently 2+ assist with all mobility; unable to engage in ADLS today.  Rehab consult pending further progress with therapies.   07/02/16 J. Laurena Valko, RN, BSN Pt medically stable and has insurance approval for admission to inpatient rehab today.    Quintella BatonJulie W. Mylo Driskill, RN, BSN  Trauma/Neuro ICU Case Manager [phone]

## 2016-07-03 ENCOUNTER — Inpatient Hospital Stay (HOSPITAL_COMMUNITY): Payer: Medicare HMO | Admitting: Speech Pathology

## 2016-07-03 ENCOUNTER — Inpatient Hospital Stay (HOSPITAL_COMMUNITY): Payer: Medicare HMO | Admitting: Occupational Therapy

## 2016-07-03 ENCOUNTER — Inpatient Hospital Stay (HOSPITAL_COMMUNITY): Payer: Medicare HMO | Admitting: Physical Therapy

## 2016-07-03 ENCOUNTER — Inpatient Hospital Stay (HOSPITAL_COMMUNITY): Payer: Medicare HMO

## 2016-07-03 DIAGNOSIS — I10 Essential (primary) hypertension: Secondary | ICD-10-CM

## 2016-07-03 DIAGNOSIS — J15212 Pneumonia due to Methicillin resistant Staphylococcus aureus: Secondary | ICD-10-CM

## 2016-07-03 DIAGNOSIS — M7989 Other specified soft tissue disorders: Secondary | ICD-10-CM

## 2016-07-03 LAB — COMPREHENSIVE METABOLIC PANEL
ALK PHOS: 225 U/L — AB (ref 38–126)
ALT: 144 U/L — AB (ref 17–63)
AST: 124 U/L — AB (ref 15–41)
Albumin: 3 g/dL — ABNORMAL LOW (ref 3.5–5.0)
Anion gap: 10 (ref 5–15)
BILIRUBIN TOTAL: 1 mg/dL (ref 0.3–1.2)
BUN: 35 mg/dL — AB (ref 6–20)
CHLORIDE: 110 mmol/L (ref 101–111)
CO2: 23 mmol/L (ref 22–32)
CREATININE: 0.8 mg/dL (ref 0.61–1.24)
Calcium: 8.9 mg/dL (ref 8.9–10.3)
GFR calc Af Amer: >60 mL/min (ref >60)
Glucose, Bld: 119 mg/dL — ABNORMAL HIGH (ref 65–99)
Potassium: 3.7 mmol/L (ref 3.5–5.1)
Sodium: 143 mmol/L (ref 135–145)
Total Protein: 6.3 g/dL — ABNORMAL LOW (ref 6.5–8.1)

## 2016-07-03 LAB — GLUCOSE, CAPILLARY
GLUCOSE-CAPILLARY: 128 mg/dL — AB (ref 65–99)
GLUCOSE-CAPILLARY: 98 mg/dL (ref 65–99)
Glucose-Capillary: 110 mg/dL — ABNORMAL HIGH (ref 65–99)
Glucose-Capillary: 119 mg/dL — ABNORMAL HIGH (ref 65–99)
Glucose-Capillary: 136 mg/dL — ABNORMAL HIGH (ref 65–99)
Glucose-Capillary: 143 mg/dL — ABNORMAL HIGH (ref 65–99)

## 2016-07-03 LAB — URINALYSIS, ROUTINE W REFLEX MICROSCOPIC
BILIRUBIN URINE: NEGATIVE
Glucose, UA: NEGATIVE mg/dL
HGB URINE DIPSTICK: NEGATIVE
Ketones, ur: NEGATIVE mg/dL
Leukocytes, UA: NEGATIVE
Nitrite: NEGATIVE
Protein, ur: NEGATIVE mg/dL
SPECIFIC GRAVITY, URINE: 1.025 (ref 1.005–1.030)
pH: 5 (ref 5.0–8.0)

## 2016-07-03 LAB — CBC WITH DIFFERENTIAL/PLATELET
BASOS ABS: 0 10*3/uL (ref 0.0–0.1)
Basophils Relative: 0 %
Eosinophils Absolute: 0.3 10*3/uL (ref 0.0–0.7)
Eosinophils Relative: 2 %
HEMATOCRIT: 47 % (ref 39.0–52.0)
HEMOGLOBIN: 15.8 g/dL (ref 13.0–17.0)
LYMPHS PCT: 13 %
Lymphs Abs: 2.1 10*3/uL (ref 0.7–4.0)
MCH: 31.8 pg (ref 26.0–34.0)
MCHC: 33.6 g/dL (ref 30.0–36.0)
MCV: 94.6 fL (ref 78.0–100.0)
Monocytes Absolute: 1.3 10*3/uL — ABNORMAL HIGH (ref 0.1–1.0)
Monocytes Relative: 8 %
NEUTROS ABS: 12.3 10*3/uL — AB (ref 1.7–7.7)
NEUTROS PCT: 77 %
PLATELETS: 408 10*3/uL — AB (ref 150–400)
RBC: 4.97 MIL/uL (ref 4.22–5.81)
RDW: 15.8 % — ABNORMAL HIGH (ref 11.5–15.5)
WBC: 16.1 10*3/uL — AB (ref 4.0–10.5)

## 2016-07-03 MED ORDER — AMANTADINE HCL 50 MG/5ML PO SYRP
100.0000 mg | ORAL_SOLUTION | Freq: Two times a day (BID) | ORAL | Status: DC
  Administered 2016-07-03 – 2016-07-24 (×40): 100 mg
  Filled 2016-07-03 (×46): qty 10

## 2016-07-03 MED ORDER — NEPRO/CARBSTEADY PO LIQD
1000.0000 mL | ORAL | Status: DC
  Administered 2016-07-03 – 2016-07-15 (×12): 1000 mL via ORAL
  Filled 2016-07-03 (×19): qty 1000

## 2016-07-03 MED ORDER — PRO-STAT SUGAR FREE PO LIQD
30.0000 mL | Freq: Two times a day (BID) | ORAL | Status: DC
  Administered 2016-07-03 – 2016-07-18 (×30): 30 mL via ORAL
  Filled 2016-07-03 (×31): qty 30

## 2016-07-03 NOTE — Progress Notes (Signed)
[place] PHYSICAL MEDICINE & REHABILITATION     PROGRESS NOTE    Subjective/Complaints: Pt sitting on BSC working with OT. Denies pain  ROS: Limited due cognitive/behavioral  Objective: Vital Signs: Blood pressure (!) 138/91, pulse 68, temperature 98.6 F (37 C), temperature source Oral, resp. rate 17, weight 83.5 kg (184 lb 1.4 oz), SpO2 94 %. Dg Abd 1 View  Result Date: 07/02/2016 CLINICAL DATA:  NG tube placement.  Smoker. EXAM: ABDOMEN - 1 VIEW COMPARISON:  CT abdomen and pelvis 06/21/2016 FINDINGS: Enteric tube tip extends to the mid abdomen to the left of midline. This is consistent with location in the second- third portion of the duodenum. Scattered gas and stool in the colon and scattered gas within the small bowel. No small or large bowel distention. No radiopaque stones visualized. Degenerative changes in the spine. IMPRESSION: Feeding tube tip localizes over the second- third portion of the duodenum with appropriate apparent position. Electronically Signed   By: Burman Nieves M.D.   On: 07/02/2016 21:42    Recent Labs  07/02/16 0844 07/03/16 0540  WBC 15.9* 16.1*  HGB 16.3 15.8  HCT 47.6 47.0  PLT 373 408*    Recent Labs  07/03/16 0540  NA 143  K 3.7  CL 110  GLUCOSE 119*  BUN 35*  CREATININE 0.80  CALCIUM 8.9   CBG (last 3)   Recent Labs  07/03/16 0416 07/03/16 0627 07/03/16 0811  GLUCAP 110* 119* 136*    Wt Readings from Last 3 Encounters:  07/03/16 83.5 kg (184 lb 1.4 oz)  07/02/16 74.3 kg (163 lb 12.8 oz)    Physical Exam:  Constitutional: He appears well-developed and well-nourished.   disheveled    HENT:  Head: Normocephalic.  Right Ear: External ear normal.  Left Ear: External ear normal.  Mouth/Throat: Oropharynx is clear and moist.  Eyes: Conjunctivae and EOM are normal. Pupils are equal, round, and reactive to light. Right eye exhibits no discharge. Left eye exhibits no discharge.  Neck: Normal range of motion. Neck supple.   Cardiovascular: RRR   Respiratory: CTA , normal effort.  GI: Soft. Bowel sounds are normal. He exhibits no distension. There is no tenderness.  Musculoskeletal: right shoulder tender, in sling Neurological: He is alert.  Flat affect. Does not make eye contact. Looks downward. Able to state his name only.  Language of confusion with jargon at times--- slow to initiate or follow basic commands.  Motor (limited by participation): >/4/5 throughout but fair sitting balance although he tends to lean forward A&Ox1 HOH  Skin: Skin is warm and dry.  Psychiatric: blunt and inattentive   Assessment/Plan: 1. 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 Physiatrist is providing close team supervision and 24 hour management of active medical problems listed below. Physiatrist and rehab team continue to assess barriers to discharge/monitor patient progress toward functional and medical goals.  Function:  Bathing Bathing position      Bathing parts      Bathing assist        Upper Body Dressing/Undressing Upper body dressing                    Upper body assist        Lower Body Dressing/Undressing Lower body dressing  Lower body assist        Toileting Toileting          Toileting assist     Transfers Chair/bed Optician, dispensingtransfer             Locomotion Ambulation           Wheelchair          Cognition Comprehension    Expression    Social Interaction    Problem Solving    Memory     Medical Problem List and Plan: 1.  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  DVT Prophylaxis/Anticoagulation: Mechanical: Sequential compression devices, below knee Bilateral lower extremities 3. Pain Management: Hydrocodone prn. Monitor for  sedation.  4. Mood: [name] to follow for evaluation and support as mentation improves.  5. Neuropsych: This patient is not capable of making decisions on his own behalf. 6. Skin/Wound Care: routine skin care 7. Fluids/Electrolytes/Nutrition: Monitor I/O. Increase water flushes  8. Right displaced sacral and right pubic rami fracture: WBAT 9. Right clavicle fracture: NWB --to keep arm below 90 degrees and sling when out of bed. 10. Acute urinary retention: Monitor voiding with PVR checks. recheck UA/UCS. Attempt to toilet patient every 4 hours.  11. MSSA RLL PNA: Continues to have low grade fevers. Will continue rocephin Day # 9/10.    -afebrile this morning, aspiration precautions, continue to follow closely 12. Pre-renal azotemia: changed tube feed to Nephro. Increased water flushes to qid.   -follow up labs later this week 13. HTN: On metoprolol bid. Will likely need to titrate medications.    LOS (Days) 1 A FACE TO FACE EVALUATION WAS PERFORMED  Ranelle OysterSWARTZ,Yalena Colon T, MD 07/03/2016 9:09 AM

## 2016-07-03 NOTE — Progress Notes (Signed)
SLP Cancellation Note  Patient Details Name: Tyler AskewDavid Lane MRN: [id number] DOB: [birth year]   Cancelled treatment:        Patient missed 60 minutes of skilled SLP therapy due to being off unit for 20 minutes at a procedure and then being asleep and SLP unable to arouse upon return.                                                                                               Fae PippinMelissa Westin Knotts, M.A., CCC-SLP [phone]   Joeanthony Seeling 07/03/2016, 4:55 PM

## 2016-07-03 NOTE — Evaluation (Signed)
Occupational Therapy Assessment and Plan  Patient Details  Name: Tyler Lane MRN: [id number] Date of Birth: 03/08/1950  OT Diagnosis: abnormal posture, cognitive deficits, muscle weakness (generalized) and coordination disorder Rehab Potential: Rehab Potential (ACUTE ONLY): Fair ELOS: 21-24 days   Today's Date: 07/03/2016 OT Individual Time: 0704-0800 OT Individual Time Calculation (min): 56 min     Problem List:  Patient Active Problem List   Diagnosis Date Noted  . SAH (subarachnoid hemorrhage) ([place]) 07/02/2016  . Intracerebral hemorrhage, intraventricular ([place]) 07/02/2016  . Right clavicle fracture 07/02/2016  . Fracture of multiple ribs 07/02/2016  . Sacral fracture ([place]) 07/02/2016  . Chest trauma   . Closed fracture of one rib   . Urinary retention   . Pneumonia of right lower lobe due to methicillin susceptible Staphylococcus aureus (MSSA) ([place])   . Prerenal azotemia   . 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 Fracture of clavicle, right, closed 06/22/2016  . TBI (traumatic brain injury) ([place]) 06/21/2016    Past Medical History:  Past Medical History:  Diagnosis Date  . Depression   . Heart murmur    previously  . High cholesterol   . HOH (hard of hearing)    mild in the right and moderate to severe in the left  . Hypertension   . Tinnitus of left ear    Past Surgical History:  Past Surgical History:  Procedure Laterality Date  . NASAL SINUS SURGERY      Assessment & Plan Clinical Impression: Patient is a 66 y.o. year old male withhistory of HTN, depression, hearing loss who was admitted on 06/21/16 after unwitnessed fall off 10' ladder. He was found down with GCS 8 at admission. CT head done showing mild acute SAH along sylvian fissures bilaterally, at high left parietal lobe and righ frontal lobe. Work up also revealed displaced fracture of right mid clavicle, right 2nd to 6 rib fractures with pulmonary contusion, contusion, mildly displaced comminuted  fracture of right pubic ramus and comminuted transverse fracture of proximal right pubic ramus and compression deformity of 6th and 7th thoracic vertebral body of uncertain etiology. Patient with aphasia and Dr.Stern question stroke. Neurology consulted and MRI brain reviewed, suggestive of DAI.  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 EEG showed moderate generalized slowing due to non specific etiology. Neurology felt patient with DAI--moderate to severe traumatic brain injury affecting mentationand recommended amantadine for activation.   Ortho/Dr. Percell Miller consulted for input and recommended WBAT BLE and RUE with sling of comfort. 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 He was started on Rocephin for RLL PNA. Cortack placed for nutritional support yesterday and he remains NPO due to fluctuating bouts of lethargy. He has had issues with urinary retention, acute renal failure, persistent leucocytosis and low grade fevers.  He is limited by bouts of agitation, difficulty following simple commands requiring verbal/ tactile cues, showing decreased confabulation, difficulty with posture due to pain and significant deficits in mobility and ability to carry out ADL tasks.  Cortack  dislodged this am and was found in oral cavity--to be replaced today.  .  Patient transferred to CIR on 07/02/2016 .    Patient currently requires max- total A with basic self-care skills secondary to muscle weakness, decreased cardiorespiratoy endurance, decreased coordination and decreased motor planning,  attention, decreased initiation, decreased awareness, decreased problem solving, decreased safety awareness, decreased memory and delayed processing and  decreased sitting balance, decreased standing balance, decreased postural control and decreased balance strategies.  Prior to hospitalization, patient could complete ADLs and IADLs with independent .  Patient will benefit from skilled intervention to decrease level of assist with basic self-care skills prior to discharge home with care partner.  Anticipate patient will require 24 hour supervision and minimal physical assistance and follow up home health.  OT - End of Session Activity Tolerance: Decreased this session Endurance Deficit: Yes Endurance Deficit Description: very fatigued throughout session OT Assessment Rehab Potential (ACUTE ONLY): Fair Barriers to Discharge: Decreased caregiver support OT Patient demonstrates impairments in the following area(s): Balance;Behavior;Cognition;Endurance;Motor;Pain;Perception;Safety OT Basic ADL's Functional Problem(s): Grooming;Bathing;Dressing;Toileting OT Transfers Functional Problem(s): Toilet;Tub/Shower OT Additional Impairment(s): None OT Plan OT Intensity: Minimum of 1-2 x/day, 45 to 90 minutes OT Frequency: 5 out of 7 days OT Duration/Estimated Length of Stay: 21-24 days OT Treatment/Interventions: Medical illustrator training;Community reintegration;Discharge planning;Cognitive remediation/compensation;Neuromuscular re-education;Self Care/advanced ADL retraining;Therapeutic Exercise;Wheelchair propulsion/positioning;UE/LE Strength taining/ROM;DME/adaptive equipment instruction;Pain management;Patient/family education;UE/LE Coordination activities;Functional mobility training;Psychosocial support;Therapeutic Activities OT Basic Self-Care Anticipated Outcome(s): min A OT Toileting Anticipated Outcome(s): min A OT Bathroom Transfers Anticipated Outcome(s): min A OT Recommendation Recommendations for Other Services:  (none at this time) Patient destination: Home Follow Up Recommendations: Home health OT Equipment Recommended: To be  determined   Skilled Therapeutic Intervention Upon entering the room, pt supine in bed with no c/o,signs,or symptoms of pain. Pt performed supine >sit with max A to EOB. Pt sitting with supervision - steady assistance for static sitting balance. Pt performed sit <>stand with max A able to come to full stand for 2 minutes. Pt returning to sitting position. Stand pivot transfer from bed> drop arm commode chair with max A. Pt having BM and needing +2 assistance for hygiene while therapist standing with pt requiring max A for standing balance. Pt unable to maintain NWB on R UE with it in sling. Pt returning to bed at end of session with max A. Call bell and all needed items within reach. Pt asleep before therapist exits the room. Bed alarm activated.  OT Evaluation Precautions/Restrictions  Precautions Precautions: Fall Precaution Comments: R shoulder sling Other Brace/Splint: has cortrax feeding tube Restrictions Weight Bearing Restrictions: Yes RUE Weight Bearing: Non weight bearing RLE Weight Bearing: Weight bearing as tolerated LLE Weight Bearing: Weight bearing as tolerated   Pain Pain Assessment Pain Assessment: Faces Faces Pain Scale: No hurt Home Living/Prior Functioning Home Living Available Help at Discharge: Family Additional Comments: EMR indicates pt lives with his long time girlfriend. Pt unable to provide any details regarding living situation and no family present.  Does have 2 daughters, on living locally.   Lives With: Significant other Prior Function Comments: EMR indicates pt fell from a ladder. Pt unable to provide any details regarding PLF. Vision Eye Alignment: Within Functional Limits Cognition Overall Cognitive Status: Impaired/Different from baseline Arousal/Alertness: Lethargic Orientation Level: Person Year:  (unable to state) Month:  (unable to state) Day of Week:  (unable to state) Memory: Impaired Memory Impairment: Decreased short term  memory;Decreased recall of new information Decreased Short Term Memory: Verbal basic;Functional basic Immediate Memory Recall:  (0/3) Memory Recall:  (0/3) Attention: Focused;Sustained Focused Attention: Appears intact Sustained Attention: Impaired Sustained Attention Impairment: Verbal basic;Functional basic Awareness: Impaired Awareness Impairment: Intellectual impairment Problem Solving: Impaired Problem Solving Impairment: Verbal basic;Functional basic Executive Function:  (All impaired due to lower level deficits )  Safety/Judgment: Impaired [employer] Scales of Cognitive Functioning: Confused/inappropriate/non-agitated Sensation Sensation Additional Comments: difficult to assess as pt not responding to questions. Pt does respond to stimulation in UEs/LEs.  Coordination Gross Motor Movements are Fluid and Coordinated: No Fine Motor Movements are Fluid and Coordinated: No Motor  Motor Motor: Motor apraxia Motor - Skilled Clinical Observations: generalized decreased strength and  coordination but difficult to assess due to lethargy and cognition.  Mobility  Transfers Transfers: Sit to Stand;Stand to Sit Sit to Stand: 2: Max assist Stand to Sit: 2: Max assist   Balance Balance Balance Assessed: Yes Static Sitting Balance Static Sitting - Level of Assistance: 4: Min assist;5: Stand by assistance Extremity/Trunk Assessment RUE Assessment RUE Assessment: Within Functional Limits RUE Strength RUE Overall Strength Comments: active grip noted, unable to test strength due to inability to follow commands LUE Assessment LUE Assessment: Exceptions to [facility] LUE Strength LUE Overall Strength Comments: pt able to hold against gravity, unable to follow commands for MMT   See Function Navigator for Current Functional Status.   Refer to Care Plan for Long Term Goals  Recommendations for other services: None    Discharge Criteria: Patient will be discharged from OT if  patient refuses treatment 3 consecutive times without medical reason, if treatment goals not met, if there is a change in medical status, if patient makes no progress towards goals or if patient is discharged from hospital.  The above assessment, treatment plan, treatment alternatives and goals were discussed and mutually agreed upon: No family available/patient unable  Gypsy Decant 07/03/2016, 6:50 PM

## 2016-07-03 NOTE — Evaluation (Signed)
Speech Language Pathology Bedside Swallow Assessment   Patient Details  Name: Tyler Lane MRN: [id number] Date of Birth: 09/11/1950  SLP Diagnosis: Dysphagia  Rehab Potential: Good ELOS: 21-24 days     Today's Date: 07/03/2016 SLP Individual Time: 0830-0920 SLP Individual Time Calculation (min): 50 min   Problem List:  Patient Active Problem List   Diagnosis Date Noted  . SAH (subarachnoid hemorrhage) ([facility]) 07/02/2016  . Intracerebral hemorrhage, intraventricular ([facility]) 07/02/2016  . Right clavicle fracture 07/02/2016  . Fracture of multiple ribs 07/02/2016  . Sacral fracture ([facility]) 07/02/2016  . Chest trauma   . Closed fracture of one rib   . Urinary retention   . Pneumonia of right lower lobe due to methicillin susceptible Staphylococcus aureus (MSSA) ([facility])   . Prerenal azotemia   . Diffuse axonal brain injury ([facility])   . Fracture of clavicle, right, closed 06/22/2016  . TBI (traumatic brain injury) ([facility]) 06/21/2016   Past Medical History:  Past Medical History:  Diagnosis Date  . Depression   . Heart murmur    previously  . High cholesterol   . HOH (hard of hearing)    mild in the right and moderate to severe in the left  . Hypertension   . Tinnitus of left ear    Past Surgical History:  Past Surgical History:  Procedure Laterality Date  . NASAL SINUS SURGERY      Assessment / Plan / Recommendation Clinical Impression Patient is a 66 y.o. male with history of HTN, depression, hearing loss who was admitted on 06/21/16 after unwitnessed fall off 10' ladder. He was found down with GCS 8 at admission. CT head done showing mild acute SAH along sylvian fissures bilaterally, at high left parietal lobe and righ frontal lobe. Work up also revealed displaced fracture of right mid clavicle, right 2nd to 6 rib fractures with pulmonary contusion, contusion, mildly displaced comminuted fracture of right pubic ramus and comminuted transverse fracture of proximal right pubic ramus  and compression deformity of 6th and 7th thoracic vertebral body of uncertain etiology. Patient with aphasia and Dr.Stern question stroke. Neurology consulted and MRI brain reviewed, suggestive of DAI.  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 EEG showed moderate generalized slowing due to non specific etiology. 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 Ortho/Dr. Murphy consulted for input and recommended WBAT BLE and RUE with sling of comfort. Patient with  Moderate to severe cognitive linguistic impairment as well as evidence of dysphagia--NPO recommended.  He was started on Rocephin for RLL PNA. Cortack placed for nutritional support yesterday and he remains NPO due to fluctuating bouts of lethargy. He has had issues with urinary retention, acute renal failure, persistent leucocytosis and low grade fevers.  He is limited by bouts of agitation, difficulty following simple commands requiring verbal/ tactile cues, showing decreased confabulation, difficulty with posture due to pain and significant deficits in mobility and ability to carry out ADL tasks.  Cortack  dislodged this am and was found in oral cavity--to be replaced today.  CIR recommended for follow up therapy. Patient transferred to CIR on 07/02/2016.    Bedside swallow evaluation complete.  Patient demonstrates decreased arousal following Total assist for completion of oral care.  He initiated taking the spoon and self-feeding with hand over   hand assist and demonstrated focused attention to ice chip boluses for 15 seconds.  SLP facilitated session with Max assist multimodal cues for oral manipulation and swallow initiation with likely delay; however, no  overt s/s of aspiration.  Given cognitive deficits and swallow function recommend continued NPO for patient's overall safety.  He will require skilled SLP intervention in order to maximize his swallow function.  Anticipate patient will require 24 hour supervision at home and follow up SLP services.    Skilled Therapeutic Interventions          BSE complete; see above.   SLP Assessment  Patient will need skilled Speech Lanaguage Pathology Services during CIR admission    Recommendations  SLP Diet Recommendations: NPO;Alternative means - temporary Medication Administration: Via alternative means Oral Care Recommendations: Oral care QID Patient destination: Home Follow up Recommendations: 24 hour supervision/assistance;Home Health SLP;Outpatient SLP Equipment Recommended: To be determined    SLP Frequency 3 to 5 out of 7 days   SLP Duration  SLP Intensity  SLP Treatment/Interventions 21-24 days   Minumum of 1-2 x/day, 30 to 90 minutes  Dysphagia/aspiration precaution training;Environmental controls;Cueing hierarchy;Internal/external aids;Patient/family education    Pain Pain Assessment Pain Score: Asleep  Prior Functioning    Function:  Eating Eating   Modified Consistency Diet: Yes (trials with SLP) Eating Assist Level: Hand over hand assist;Helper feeds patient           Cognition Comprehension Comprehension assist level: Understands basic less than 25% of the time/ requires cueing >75% of the time  Expression   Expression assist level: Expresses basis less than 25% of the time/requires cueing >75% of the time.  Social Interaction Social Interaction assist level: Interacts appropriately 25 - 49% of time - Needs frequent redirection.  Problem Solving Problem solving assist level: Solves basic less than 25% of the time - needs direction nearly all the time or does not effectively solve problems and may need a restraint for safety  Memory Memory assist level: Recognizes  or recalls less than 25% of the time/requires cueing greater than 75% of the time   Short Term Goals: Week 1: SLP Short Term Goal 1 (Week 1): Patient will follow commands in 50% of opportunities with Max A verbal cues.  SLP Short Term Goal 2 (Week 1): Patient will orient to place and situation with Max A verbal and visual cues.  SLP Short Term Goal 3 (Week 1): Patient will demonstrate sustained attention to a functional task for ~2 minutes with Max A multimodal cues.  SLP Short Term Goal 4 (Week 1): Patient will request assistance/make needs known during functional tasks with Max A verbal cues.  SLP Short Term Goal 5 (Week 1): Patient will consume trials of ice chips with Mod assist multimodal cues for initiation of swallows in less thean 30 seconds with minimal overt s/s of aspiration.  Refer to Care Plan for Long Term Goals  Recommendations for other services: None   Discharge Criteria: Patient will be discharged from SLP if patient refuses treatment 3 consecutive times without medical reason, if treatment goals not met, if there is a change in medical status, if patient makes no progress towards goals or if patient is discharged from hospital.  The above assessment, treatment plan, treatment alternatives and goals were discussed and mutually agreed upon: No family available/patient unable   , M.A., CCC-SLP [phone]  , 07/03/2016, 3:35 PM  

## 2016-07-03 NOTE — Progress Notes (Signed)
*  PRELIMINARY RESULTS* Vascular Ultrasound Lower extremity venous duplex has been completed.  Preliminary findings: No evidence of DVT or baker's cyst.    Farrel DemarkJill Eunice, RDMS, RVT  07/03/2016, 2:50 PM

## 2016-07-03 NOTE — Progress Notes (Signed)
Physical Therapy Assessment and Plan  Patient Details  Name: Tyler Lane MRN: [id number] Date of Birth: 07-27-1950  PT Diagnosis: Abnormality of gait, Cognitive deficits, Difficulty walking, Impaired cognition, Muscle weakness and Pain in Rt clavicle and ribs.  Rehab Potential: Good ELOS: 21-24 days   Today's Date: 07/03/2016 PT Individual Time: 1001-1101 PT Individual Time Calculation (min): 60 min    Problem List:  Patient Active Problem List   Diagnosis Date Noted  . SAH (subarachnoid hemorrhage) ([place]) 07/02/2016  . Intracerebral hemorrhage, intraventricular ([place]) 07/02/2016  . Right clavicle fracture 07/02/2016  . Fracture of multiple ribs 07/02/2016  . Sacral fracture ([place]) 07/02/2016  . Chest trauma   . Closed fracture of one rib   . Urinary retention   . Pneumonia of right lower lobe due to methicillin susceptible Staphylococcus aureus (MSSA) ([place])   . Prerenal azotemia   . 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 Fracture of clavicle, right, closed 06/22/2016  . TBI (traumatic brain injury) ([place]) 06/21/2016    Past Medical History:  Past Medical History:  Diagnosis Date  . Depression   . Heart murmur    previously  . High cholesterol   . HOH (hard of hearing)    mild in the right and moderate to severe in the left  . Hypertension   . Tinnitus of left ear    Past Surgical History:  Past Surgical History:  Procedure Laterality Date  . NASAL SINUS SURGERY      Assessment & Plan Clinical Impression: Pt is a Tyler Lane history of HTN, depression, hearing loss who was admitted on 06/21/16 after unwitnessed fall off 10' ladder. He was found down with GCS 8 at admission. CT head done showing mild acute SAH along sylvian fissures bilaterally, at high left parietal lobe and righ frontal lobe. Work up also revealed displaced fracture of right mid clavicle, right 2nd to 6 rib fractures with pulmonary contusion, contusion, mildly displaced comminuted fracture of  right pubic ramus and comminuted transverse fracture of proximal right pubic ramus and compression deformity of 6th and 7th thoracic vertebral body of uncertain etiology. Patient with aphasia and Dr.Stern question stroke. Neurology consulted and MRI brain reviewed, suggestive of DAI.  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 EEG showed moderate generalized slowing due to non specific etiology. Neurology felt patient with DAI--moderate to severe traumatic brain injury affecting mentationand recommended amantadine for activation.   Ortho/Dr. Percell Miller consulted for input and recommended WBAT BLE and RUE with sling of comfort. 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 He was started on Rocephin for RLL PNA. Cortack placed for nutritional support yesterday and he remains NPO due to fluctuating bouts of lethargy. He has had issues with urinary retention, acute renal failure, persistent leucocytosis and low grade fevers.  He is limited by bouts of agitation, difficulty following simple commands requiring verbal/ tactile cues, showing decreased confabulation, difficulty with posture due to pain and significant deficits in mobility and ability to carry out ADL tasks.  Cortack  dislodged this am and was found in oral cavity--to be replaced today.  CIR recommended for follow up therapy. Patient transferred to CIR on 07/02/2016 .   Patient currently requires max with mobility secondary to muscle weakness, decreased initiation, decreased attention, decreased awareness, decreased problem solving,  decreased safety awareness, decreased memory and delayed processing and decreased sitting balance, decreased standing balance, decreased postural control  and difficulty maintaining precautions.  Prior to hospitalization, patient was independent  with mobility and lived with Significant other in a   home.  Home access is unknown at this time.   Patient will benefit from skilled PT intervention to maximize safe functional mobility and minimize fall risk for planned discharge home with 24 hour assist.  Anticipate patient will benefit from follow up [facility] at discharge.  PT - End of Session Activity Tolerance: Tolerates 30+ min activity with multiple rests PT Assessment Rehab Potential (ACUTE/IP ONLY): Good PT Patient demonstrates impairments in the following area(s): Balance;Endurance;Motor;Safety PT Transfers Functional Problem(s): Bed Mobility;Bed to Chair;Car;Furniture;Floor PT Locomotion Functional Problem(s): Ambulation;Wheelchair Mobility;Stairs PT Plan PT Intensity: Minimum of 1-2 x/day ,45 to 90 minutes PT Frequency: 5 out of 7 days PT Duration Estimated Length of Stay: 21-24 days PT Treatment/Interventions: Ambulation/gait training;Balance/vestibular training;Cognitive remediation/compensation;Community reintegration;Discharge planning;Disease management/prevention;DME/adaptive equipment instruction;Functional mobility training;Neuromuscular re-education;Patient/family education;Psychosocial support;Stair training;Therapeutic Activities;Therapeutic Exercise;UE/LE Strength taining/ROM;UE/LE Coordination activities;Wheelchair propulsion/positioning PT Transfers Anticipated Outcome(s): min assist with LRAD PT Locomotion Anticipated Outcome(s): Min assist with LRAD PT Recommendation Recommendations for Other Services: Therapeutic Recreation consult Therapeutic Recreation Interventions: Outing/community reintergration Follow Up Recommendations: Home health PT;24 hour supervision/assistance Patient destination: Home Equipment Recommended: To be determined  Skilled Therapeutic Intervention Pt in bed sleeping upon arrival. Pt able to be aroused  briefly initially with verbal and tactile stimulation. Pt quickly falling asleep and unable to answer questions regarding PLF or home situation. Bed mobility requiring total assistance and +2 to return to supine. While sitting, the pt did become more alert and able to inconsistently respond to questions. Attempting standing X4 with initial attempt pt able to clear buttock off bed but unable to fully stand. Attempted again and even provided stedy for 2 attempts. Pt unable to achieve standing during session. Pt very slow to initiate movement and falling asleep when additional time provided. Following session, pt returned to bed with bed alarm on, and all needs in reach.   PT Evaluation Precautions/Restrictions Precautions Precautions: Fall Precaution Comments: R shoulder sling (for comfort) Restrictions Weight Bearing Restrictions: Yes RUE Weight Bearing: Non weight bearing RLE Weight Bearing: Weight bearing as tolerated LLE Weight Bearing: Weight bearing as tolerated Pain  Verbally denies pain but guarding noted through Rt clavicular and rib region.  Home Living/Prior Functioning Home Living Additional Comments: EMR indicates pt lives with his long time girlfriend. Pt unable to provide any details regarding living situation and no family present.  Does have 2 daughters, on living locally.   Lives With: Significant other Prior Function Comments: EMR indicates pt fell from a ladder. Pt unable to provide any details regarding PLF. Vision/Perception  Vision - Assessment Eye Alignment: Within Functional Limits Praxis Praxis: Impaired Praxis Impairment Details: Motor planning;Initiation  Cognition Overall Cognitive Status: Impaired/Different from baseline Arousal/Alertness: Lethargic Orientation Level: Oriented to person;Disoriented to situation;Disoriented to place;Disoriented to time Attention: Focused;Sustained Focused Attention: Appears intact Sustained Attention: Impaired Sustained  Attention Impairment: Verbal basic;Functional basic Memory: Impaired Memory Impairment: Decreased short term memory;Decreased recall of new information Decreased Short Term Memory: Verbal basic;Functional basic Awareness: Impaired Awareness Impairment: Intellectual impairment Problem Solving: Impaired Problem Solving Impairment: Verbal basic;Functional basic Executive Function:  (All impaired due to lower level deficits ) Safety/Judgment: Impaired [employer] Scales of Cognitive Functioning: Confused/inappropriate/non-agitated Sensation Sensation Additional Comments: difficult to assess as pt not responding to questions. Pt does respond to stimulation in  UEs/LEs.  Coordination Gross Motor Movements are Fluid and Coordinated: No (difficult to assess due to lethargy) Fine Motor Movements are Fluid and Coordinated:  (difficult to assess due to lethargy) Coordination and Movement Description:  (difficult to assess due to lethargy) Motor  Motor Motor: Motor apraxia Motor - Skilled Clinical Observations: generalized decreased strength and  coordination but difficult to assess due to lethargy and cognition.    Balance Balance Balance Assessed: Yes Static Sitting Balance Static Sitting - Comment/# of Minutes: Pt able to sit at EOB with UE support and close supervision Extremity Assessment  RUE Assessment RUE Assessment: Exceptions to [facility] RUE Strength RUE Overall Strength Comments: active grip noted, unable to test strength due to inability to follow commands LUE Assessment LUE Assessment: Exceptions to [facility] LUE Strength LUE Overall Strength Comments: pt able to hold against gravity, unable to follow commands for MMT RLE Assessment RLE Assessment: Exceptions to [facility] RLE PROM (degrees) RLE Overall PROM Comments: grossly WFL- no reported or observed pain reactions RLE Strength RLE Overall Strength Comments: difficult to assess due to lethargy and cognition - pt noted to move LE  actively during session against gravity.  LLE Assessment LLE Assessment: Exceptions to WFL LLE PROM (degrees) LLE Overall PROM Comments: grossly WFL- no reported or observed pain reactions LLE Strength LLE Overall Strength Comments: difficult to assess due to lethargy and cognition - pt noted to move LE actively during session against gravity.    See Function Navigator for Current Functional Status.   Refer to Care Plan for Long Term Goals  Recommendations for other services: Therapeutic Recreation  Outing/community reintegration  Discharge Criteria: Patient will be discharged from PT if patient refuses treatment 3 consecutive times without medical reason, if treatment goals not met, if there is a change in medical status, if patient makes no progress towards goals or if patient is discharged from hospital.  The above assessment, treatment plan, treatment alternatives and goals were discussed and mutually agreed upon: No family available/patient unable.   Linard Millers, PT 07/03/2016, 12:45 PM

## 2016-07-03 NOTE — Progress Notes (Signed)
Initial Nutrition Assessment  DOCUMENTATION CODES:   Not applicable  INTERVENTION:  Decrease Nepro formula via Cortrak NGT to new goal rate of 55 ml/hr x 20 hours (may hold TF for up to 4 hours for therapy) with 30 ml Prostat BID to provide 2180 kcal (100% of needs), 119 grams of protein, and 803 ml of free water.   Continue free water flushes of 200 ml every 6 hours via tube. Total free water: 1603 ml/day  RD to continue to monitor.   NUTRITION DIAGNOSIS:   Inadequate oral intake related to inability to eat as evidenced by NPO status.  GOAL:   Patient will meet greater than or equal to 90% of their needs  MONITOR:   TF tolerance, Weight trends, Labs, Skin, I & O's  REASON FOR ASSESSMENT:   Consult Enteral/tube feeding initiation and management  ASSESSMENT:   66 y.o. male with history of HTN, depression, hearing loss who was admitted on 06/21/16 after unwitnessed fall off 10' ladder. CT head done showing mild acute SAH along sylvian fissures bilaterally, at high left parietal lobe and righ frontal lobe. Work up also revealed displaced fracture of right mid clavicle, right 2nd to 6 rib fractures with pulmonary contusion, contusion, mildly displaced comminuted fracture of right pubic ramus and comminuted transverse fracture of proximal right pubic ramus and compression deformity of 6th and 7th thoracic vertebral body.  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  Pt was unavailable during attempted time of visit. Pt with pre-renal azotemia, thus tube feeding formula has been  changed to Nepro. Pt currently has Nepro formula ordered at 70 ml/hr x 20 hours which provides 2520 kcal (123% of kcal needs), 113 grams of protein, and 1022 ml of free water. RD to modify tube feeding orders to better meet nutrition needs. Unable to complete Nutrition-Focused physical exam at this time. RD to perform physical exam at next visit.  Labs and medications reviewed.   Diet Order:  Diet NPO time specified  Skin:  Reviewed, no issues  Last BM:  5/20  Height:   Ht Readings from Last 1 Encounters:  06/21/16 5\' 11"  (1.803 m)    Weight:   Wt Readings from Last 1 Encounters:  07/03/16 184 lb 1.4 oz (83.5 kg)    Ideal Body Weight:  78 kg  BMI:  Body mass index is 25.67 kg/m.  Estimated Nutritional Needs:   Kcal:  2050-2250  Protein:  110-125 grams  Fluid:  >2 L/day  EDUCATION NEEDS:   No education needs identified at this time  Roslyn SmilingStephanie Laquesha Holcomb, MS, RD, LDN Pager # [phone] After hours/ weekend pager # [phone]

## 2016-07-03 NOTE — Evaluation (Signed)
Speech Language Pathology Assessment and Plan  Patient Details  Name: Tyler Lane MRN: [id number] Date of Birth: 25-Jan-1951  SLP Diagnosis: Cognitive Impairments  Rehab Potential: Good ELOS: 21-24 days     Today's Date: 07/03/2016 SLP Individual Time: 0830-0920 SLP Individual Time Calculation (min): 50 min  Missed Minutes: 10 minutes, lethargy    Problem List:  Patient Active Problem List   Diagnosis Date Noted  . SAH (subarachnoid hemorrhage) ([place]) 07/02/2016  . Intracerebral hemorrhage, intraventricular ([place]) 07/02/2016  . Right clavicle fracture 07/02/2016  . Fracture of multiple ribs 07/02/2016  . Sacral fracture ([place]) 07/02/2016  . Chest trauma   . Closed fracture of one rib   . Urinary retention   . Pneumonia of right lower lobe due to methicillin susceptible Staphylococcus aureus (MSSA) ([place])   . Prerenal azotemia   . 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 Fracture of clavicle, right, closed 06/22/2016  . TBI (traumatic brain injury) ([place]) 06/21/2016   Past Medical History:  Past Medical History:  Diagnosis Date  . Depression   . Heart murmur    previously  . High cholesterol   . HOH (hard of hearing)    mild in the right and moderate to severe in the left  . Hypertension   . Tinnitus of left ear    Past Surgical History:  Past Surgical History:  Procedure Laterality Date  . NASAL SINUS SURGERY      Assessment / Plan / Recommendation Clinical Impression Patient is a 66 y.o. male with history of HTN, depression, hearing loss who was admitted on 06/21/16 after unwitnessed fall off 10' ladder. He was found down with GCS 8 at admission. CT head done showing mild acute SAH along sylvian fissures bilaterally, at high left parietal lobe and righ frontal lobe. Work up also revealed displaced fracture of right mid clavicle, right 2nd to 6 rib fractures with pulmonary contusion, contusion, mildly displaced comminuted fracture of right pubic ramus and comminuted  transverse fracture of proximal right pubic ramus and compression deformity of 6th and 7th thoracic vertebral body of uncertain etiology. Patient with aphasia and Dr.Stern question stroke. Neurology consulted and MRI brain reviewed, suggestive of DAI.  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 EEG showed moderate generalized slowing due to non specific etiology. 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 Ortho/Dr. Percell Miller consulted for input and recommended WBAT BLE and RUE with sling of comfort. Patient with  Moderate to severe cognitive linguistic impairment as well as evidence of dysphagia--NPO recommended.  He was started on Rocephin for RLL PNA. Cortack placed for nutritional support yesterday and he remains NPO due to fluctuating bouts of lethargy. He has had issues with urinary retention, acute renal failure, persistent leucocytosis and low grade fevers.  He is limited by bouts of agitation, difficulty following simple commands requiring verbal/ tactile cues, showing decreased confabulation, difficulty with posture due to pain and significant deficits in mobility and ability to carry out ADL tasks.  Cortack  dislodged this am and was found in oral cavity--to be replaced today.  CIR recommended for follow up therapy. Patient transferred to CIR on 07/02/2016.    Patient demonstrates behaviors consistent with a Rancho Level V and requires overall Max-Total A for following commands, sustained attention, intellectual awareness,  initiation, orientation, problem solving, recall and overall safety with functional tasks. Patient was lethargic throughout session which also impacted his overall function. However, patient  did verbalize the need to void but was incontinent. BSE was not administered this session due to lethargy. Patient would benefit from skilled SLP intervention to maximize his cognitive function and overall functional independence prior to discharge.    Skilled Therapeutic Interventions          Administered a cognitive-linguistic evaluation. Please see above for details.   SLP Assessment  Patient will need skilled [place] [facility] during CIR admission    Recommendations  Patient destination: Home Follow up Recommendations: 24 hour supervision/assistance;Home Health SLP Equipment Recommended: None recommended by SLP    SLP Frequency 3 to 5 out of 7 days   SLP Duration  SLP Intensity  SLP Treatment/Interventions 21-24 days   Minumum of 1-2 x/day, 30 to 90 minutes  Cognitive remediation/compensation;Cueing hierarchy;Functional tasks;Therapeutic Activities;Environmental controls;Internal/external aids    Pain Pain Assessment No/Denies Pain   Prior Functioning    Function:   Cognition Comprehension Comprehension assist level: Understands basic less than 25% of the time/ requires cueing >75% of the time  Expression   Expression assist level: Expresses basis less than 25% of the time/requires cueing >75% of the time.  Social Interaction Social Interaction assist level: Interacts appropriately 25 - 49% of time - Needs frequent redirection.  Problem Solving Problem solving assist level: Solves basic less than 25% of the time - needs direction nearly all the time or does not effectively solve problems and may need a restraint for safety  Memory Memory assist level: Recognizes or recalls less than 25% of the time/requires cueing greater than 75% of the time   Short Term Goals: Week 1: SLP Short Term Goal 1 (Week 1): Patient will follow commands in 50% of opportunities with Max A verbal cues.  SLP Short Term Goal 2 (Week 1): Patient will orient to place and situation  with Max A verbal and visual cues.  SLP Short Term Goal 3 (Week 1): Patient will demonstrate sustained attention to a functional task for ~2 minutes with Max A multimodal cues.  SLP Short Term Goal 4 (Week 1): Patient will request assistance/make needs known during functional tasks with Max A verbal cues.   Refer to Care Plan for Long Term Goals  Recommendations for other services: None   Discharge Criteria: Patient will be discharged from SLP if patient refuses treatment 3 consecutive times without medical reason, if treatment goals not met, if there is a change in medical status, if patient makes no progress towards goals or if patient is discharged from hospital.  The above assessment, treatment plan, treatment alternatives and goals were discussed and mutually agreed upon: No family available/patient unable  [place], [place] 07/03/2016, 3:22 PM

## 2016-07-04 ENCOUNTER — Inpatient Hospital Stay (HOSPITAL_COMMUNITY): Payer: Medicare HMO | Admitting: Occupational Therapy

## 2016-07-04 ENCOUNTER — Inpatient Hospital Stay (HOSPITAL_COMMUNITY): Payer: Medicare HMO | Admitting: Speech Pathology

## 2016-07-04 ENCOUNTER — Inpatient Hospital Stay (HOSPITAL_COMMUNITY): Payer: Medicare HMO | Admitting: Physical Therapy

## 2016-07-04 DIAGNOSIS — R1312 Dysphagia, oropharyngeal phase: Secondary | ICD-10-CM

## 2016-07-04 LAB — GLUCOSE, CAPILLARY
GLUCOSE-CAPILLARY: 115 mg/dL — AB (ref 65–99)
Glucose-Capillary: 122 mg/dL — ABNORMAL HIGH (ref 65–99)
Glucose-Capillary: 97 mg/dL (ref 65–99)
Glucose-Capillary: 125 mg/dL — ABNORMAL HIGH (ref 65–99)
Glucose-Capillary: 101 mg/dL — ABNORMAL HIGH (ref 65–99)
Glucose-Capillary: 112 mg/dL — ABNORMAL HIGH (ref 65–99)
Glucose-Capillary: 115 mg/dL — ABNORMAL HIGH (ref 65–99)

## 2016-07-04 LAB — URINE CULTURE: Culture: NO GROWTH

## 2016-07-04 NOTE — Progress Notes (Signed)
Physical Therapy Session Note  Patient Details  Name: Tyler AskewDavid Lane MRN: [id number] Date of Birth: 10/30/1950  Today's Date: 07/04/2016 PT Individual Time: [id number] PT Individual Time Calculation (min): 65 min   Short Term Goals: Week 1:  PT Short Term Goal 1 (Week 1): Pt to perform supine<>sitting with mod assist.  PT Short Term Goal 2 (Week 1): Pt to perform sit<>stand with max assist and LRAD PT Short Term Goal 3 (Week 1): Pt to ambulate 10 ft with assistance and LRAD.  PT Short Term Goal 4 (Week 1): Pt to sit X 8 min with supervision for balance and trunk control   Skilled Therapeutic Interventions/Progress Updates:  Pt received in bed, pt with behaviors demonstrating significant pain in RLE, R ribs - RN made aware & meds administered. Pt observed to be incontinent of bowel & bladder & required max assist to initiate and participate in rolling L/R with bed rails; pt with limited participation 2/2 pain. Therapist provided total assist for peri hygiene and pt also reported need to urinate again with continent void this time (therapist providing total assist for urinal management). Pt transferred supine>sitting EOB with +2 assist and squat pivot bed>w/c with max assist +1. Pt with ability to support weight with BLE when pivoting, but requires max cuing for initiation and weight shifting. Provided pt with larger w/c for increased comfort and positioning. Pt able to transfer sit<>stand at parallel bars with +2 assistance with inability to shift pelvis anteriorly thus remaining in a flexed position. Attempted to have pt stand & ambulate without any AD but pt continues to demonstrate forward flexed posture and impaired awareness of body & situation. Pt unable to weight shift L/R to advance either LE with +2 assist. At end of session pt left in w/c with QRB donned & at nurses station.   Pt able to recall correct birthday (day & month) and name on this date. Throughout session therapist oriented pt to  situation, location, time.   Throughout session pt unable to maintain NWB RUE despite max multimodal cuing & assistance.   Therapy Documentation Precautions:  Precautions Precautions: Fall Precaution Comments: R shoulder sling Other Brace/Splint: has cortrax feeding tube Restrictions Weight Bearing Restrictions: Yes RUE Weight Bearing: Non weight bearing RLE Weight Bearing: Weight bearing as tolerated LLE Weight Bearing: Weight bearing as tolerated  General: PT Amount of Missed Time (min): 10 Minutes PT Missed Treatment Reason: Patient fatigue    See Function Navigator for Current Functional Status.   Therapy/Group: Individual Therapy  Sandi MariscalVictoria M Miller 07/04/2016, 4:05 PM

## 2016-07-04 NOTE — Progress Notes (Signed)
Recreational Therapy Session Note  Patient Details  Name: Tyler AskewDavid Lane MRN: [id number] Date of Birth: Jan 30, 1951 Today's Date: 07/04/2016   Eval deferred, pt is not yet appropriate for TR services.  Will continue to monitor through team for future participation. Aarvi Stotts 07/04/2016, 4:08 PM

## 2016-07-04 NOTE — Progress Notes (Signed)
Speech Language Pathology Daily Session Note  Patient Details  Name: Tyler AskewDavid Lane MRN: [id number] Date of Birth: 25-May-1950  Today's Date: 07/04/2016 SLP Individual Time: 1305-1400 SLP Individual Time Calculation (min): 55 min  Short Term Goals: Week 1: SLP Short Term Goal 1 (Week 1): Patient will follow commands in 50% of opportunities with Max A verbal cues.  SLP Short Term Goal 2 (Week 1): Patient will orient to place and situation with Max A verbal and visual cues.  SLP Short Term Goal 3 (Week 1): Patient will demonstrate sustained attention to a functional task for ~2 minutes with Max A multimodal cues.  SLP Short Term Goal 4 (Week 1): Patient will request assistance/make needs known during functional tasks with Max A verbal cues.  SLP Short Term Goal 5 (Week 1): Patient will consume trials of ice chips with Mod assist multimodal cues for initiation of swallows in less thean 30 seconds with minimal overt s/s of aspiration.  Skilled Therapeutic Interventions: Skilled treatment session focused on addressing dysphagia and cognition goals. Upon SLP arrival patient awake, alert, and verbally responsive.  SLP facilitated session by providing Total assist to re-orient patient.  He reported that he was at the shoe shine shop, it was January and he was unable to state the year.  Following re-orientation patient unable to recall after 30 seconds.  Patient problem solved opening food container with Max assist multimodal cues.  SLP also facilitated session with skilled observation of patient consuming thin and nectar-thick liquids via cup as well as Dys.1 textures via spoon.  Across consistencies patient with suspected delay in swallow initiation, multiple swallows, intermittent wet vocal quality, and intermittent delayed cough.  If patient remains awake and alert likely a candidate for an objective assessment in the next couple of days.  Continue with current plan of care.      Function:  Eating Eating   Modified Consistency Diet: Yes (trilas with SLP) Eating Assist Level: Set up assist for;Helper checks for pocketed food;Help with picking up utensils;Help managing cup/glass;Hand over hand assist   Eating Set Up Assist For: Opening containers       Cognition Comprehension Comprehension assist level: Understands basic less than 25% of the time/ requires cueing >75% of the time  Expression   Expression assist level: Expresses basic 25 - 49% of the time/requires cueing 50 - 75% of the time. Uses single words/gestures.  Social Interaction Social Interaction assist level: Interacts appropriately 25 - 49% of time - Needs frequent redirection.  Problem Solving Problem solving assist level: Solves basic 25 - 49% of the time - needs direction more than half the time to initiate, plan or complete simple activities  Memory Memory assist level: Recognizes or recalls less than 25% of the time/requires cueing greater than 75% of the time    Pain Pain Assessment Pain Assessment: No/denies pain  Therapy/Group: Individual Therapy  Tyler FerrettiMelissa [name], M.A., CCC-SLP [phone]  Tyler Lane 07/04/2016, 1:59 PM

## 2016-07-04 NOTE — Patient Care Conference (Signed)
Inpatient RehabilitationTeam Conference and Plan of Care Update Date: 07/03/2016   Time: 2:55 PM    Patient Name: Tyler Lane      Medical Record Number: [id number]  Date of Birth: 27-Dec-1950 Sex: Male         Room/Bed: 4W24C/4W24C-01 Payor Info: Payor: [employer] MEDICARE / Plan: [employer] MEDICARE HMO / Product Type: *No Product type* /    Admitting Diagnosis: tbi multitrauma  Admit Date/Time:  07/02/2016  6:27 PM Admission Comments: No comment available   Primary Diagnosis:  Diffuse axonal brain injury (HCC) Principal Problem: Diffuse axonal brain injury [facility])  Patient Active Problem List   Diagnosis Date Noted  . SAH (subarachnoid hemorrhage) (HCC) 07/02/2016  . Intracerebral hemorrhage, intraventricular (HCC) 07/02/2016  . Right clavicle fracture 07/02/2016  . Fracture of multiple ribs 07/02/2016  . Sacral fracture (HCC) 07/02/2016  . Chest trauma   . Closed fracture of one rib   . Urinary retention   . Pneumonia of right lower lobe due to methicillin susceptible Staphylococcus aureus (MSSA) (HCC)   . Prerenal azotemia   . Diffuse axonal brain injury (HCC)   . Fracture of clavicle, right, closed 06/22/2016  . TBI (traumatic brain injury) (HCC) 06/21/2016    Expected Discharge Date: Expected Discharge Date:  (21-24 days)  Team Members Present: Physician leading conference: Dr. Faith Rogue Social Worker Present: Amada Jupiter, [name] Nurse Present: Greta Doom, RN PT Present: Aleda Grana, PT OT Present: Callie Fielding, OT SLP Present: Feliberto Gottron, SLP PPS Coordinator present : Edson Snowball, PT     Current Status/Progress Goal Weekly Team Focus  Medical   DAI with clavicle and polytrauma after fall from ladder. confused, poor initiation  improve awareness and insight  sleep wake, pain, ortho precautions   Bowel/Bladder   Incontinent to bowel and bladder.LBM 5/22  To keep pt. continent with max. assist,  To monitor bowel and bladder function Q shift,    Swallow/Nutrition/ Hydration   NPO with NG tube  Min A with least restrictive diet  trials to work towards objective swallow study    ADL's   max - total A   min A overall  self care retraining, functional transfers, balance, cognition,   Mobility   max-total assist for bed mobility. Unable to achieve standing despite multiple attempts and techniques. Very lethargic, more alert in sitting. Very slow initiation.   min assist level for transfers/gait.   bed mobility, transfers, sitting and standing balance as tolerated, attending to task   Communication             Safety/Cognition/ Behavioral Observations  [place] Level V, Total A  Min A  attention, orientation, initiation    Pain   Generalized pain.Hycet Q 6hrs.   To keep pain levels less than 3  To monitor for pain Q2-3 hrs.   Skin   Dry and intact.  To keep skin free of pressure sores.  To assess skin Q shift.    Rehab Goals Patient on target to meet rehab goals: Yes *See Care Plan and progress notes for long and short-term goals.  Barriers to Discharge: ongoing cognitive deficits, ortho precautions,     Possible Resolutions to Barriers:  cognitive-behavioral rx, sleep restoration, consider stimulant    Discharge Planning/Teaching Needs:  Plan for pt to d/c home with s/o who will share caregiver responsibilities with pt's local daughter.  They are aware will need 24/7 care.  Teaching to be ongoing.   Team Discussion:  New pt and multiple injuries/  TBI. Still very confused and require NG fdgs.  Very lethargic for evals;  Need to monitor sleep-wake cycle.  Total assist with cognition;  Mouth needs oral care.  Holding food in mouth.  Min assist goals overall.  Revisions to Treatment Plan:  None   Continued Need for Acute Rehabilitation Level of Care: The patient requires daily medical management by a physician with specialized training in physical medicine and rehabilitation for the following conditions: Daily direction of a  multidisciplinary physical rehabilitation program to ensure safe treatment while eliciting the highest outcome that is of practical value to the patient.: Yes Daily medical management of patient stability for increased activity during participation in an intensive rehabilitation regime.: Yes Daily analysis of laboratory values and/or radiology reports with any subsequent need for medication adjustment of medical intervention for : Post surgical problems;Neurological problems  Tyler Lane 07/05/2016, 1:51 PM

## 2016-07-04 NOTE — Progress Notes (Signed)
[facility] PHYSICAL MEDICINE [facility] REHABILITATION     PROGRESS NOTE    Subjective/Complaints: Much more alert today. Working with SLP  ROS: Limited due cognitive/behavioral  Objective: Vital Signs: Blood pressure (!) 156/87, pulse 76, temperature 97.8 F (36.6 C), temperature source Oral, resp. rate 19, weight 90 kg (198 lb 6.6 oz), SpO2 94 %. Dg Chest 2 View  Result Date: 07/03/2016 CLINICAL DATA:  Shortness of breath. The patient suffered multiple rib fractures of fall from a ladder 06/21/2016. EXAM: CHEST  2 VIEW COMPARISON:  PA and lateral chest 06/29/2016. Single view of the chest 06/28/2016. CT chest 06/21/2016. FINDINGS: Right pleural effusion and basilar airspace disease have markedly improved. The left lung is clear. There is no pneumothorax. Heart size is normal. Aortic atherosclerosis is noted. Multiple right rib fractures are again seen. Feeding tube courses into the duodenum and below the inferior margin of the film. IMPRESSION: Marked improvement in a right pleural effusion basilar airspace disease. Multiple right rib fractures as seen on prior examinations. No pneumothorax. Electronically Signed   By: Drusilla Kanner M.D.   On: 07/03/2016 15:36   Dg Abd 1 View  Result Date: 07/02/2016 CLINICAL DATA:  NG tube placement.  Smoker. EXAM: ABDOMEN - 1 VIEW COMPARISON:  CT abdomen and pelvis 06/21/2016 FINDINGS: Enteric tube tip extends to the mid abdomen to the left of midline. This is consistent with location in the second- third portion of the duodenum. Scattered gas and stool in the colon and scattered gas within the small bowel. No small or large bowel distention. No radiopaque stones visualized. Degenerative changes in the spine. IMPRESSION: Feeding tube tip localizes over the second- third portion of the duodenum with appropriate apparent position. Electronically Signed   By: Burman Nieves M.D.   On: 07/02/2016 21:42    Recent Labs  07/02/16 0844 07/03/16 0540  WBC 15.9*  16.1*  HGB 16.3 15.8  HCT 47.6 47.0  PLT 373 408*    Recent Labs  07/03/16 0540  NA 143  K 3.7  CL 110  GLUCOSE 119*  BUN 35*  CREATININE 0.80  CALCIUM 8.9   CBG (last 3)   Recent Labs  07/03/16 1623 07/04/16 0021 07/04/16 0359  GLUCAP 143* 115* 97    Wt Readings from Last 3 Encounters:  07/04/16 90 kg (198 lb 6.6 oz)  07/02/16 74.3 kg (163 lb 12.8 oz)    Physical Exam:  Constitutional: He appears well-developed and well-nourished.   disheveled    HENT:  Head: Normocephalic.  Right Ear: External ear normal.  Left Ear: External ear normal.  Mouth/Throat: Oropharynx is clear and moist.  Eyes: Conjunctivae and EOM are normal. Pupils are equal, round, and reactive to light. Right eye exhibits no discharge. Left eye exhibits no discharge.  Neck: Normal range of motion. Neck supple.  Cardiovascular: RRR   Respiratory: CTA B GI: Soft. Bowel sounds are normal. He exhibits no distension. There is no tenderness.  Musculoskeletal: right shoulder tender, in sling Neurological: He is alert.  Better attention/eye contact.  Oriented to name only. Confused language.  Motor  : moves all 4.  A&Ox1 HOH  Skin: Skin is warm and dry.  Psychiatric: blunt and inattentive   Assessment/Plan: 1. 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 Physiatrist is providing close team supervision and 24 hour management of active medical problems listed below. Physiatrist and rehab team continue to assess barriers  to discharge/monitor patient progress toward functional and medical goals.  Function:  Bathing Bathing position Bathing activity did not occur: Refused    Bathing parts      Bathing assist        Upper Body Dressing/Undressing Upper body dressing   What is the patient wearing?: Hospital gown                Upper body assist Assist Level: Touching or steadying  assistance(Pt > 75%)      Lower Body Dressing/Undressing Lower body dressing   What is the patient wearing?: Non-skid slipper socks           Non-skid slipper socks- Performed by helper: Don/doff right sock, Don/doff left sock                  Lower body assist        Toileting Toileting Toileting activity did not occur: No continent bowel/bladder event   Toileting steps completed by helper: Adjust clothing prior to toileting, Performs perineal hygiene, Adjust clothing after toileting    Toileting assist Assist level: Two helpers   Transfers Chair/bed transfer Chair/bed transfer activity did not occur: Safety/medical concerns           Locomotion Ambulation Ambulation activity did not occur: Safety/medical concerns         Wheelchair Wheelchair activity did not occur: Safety/medical concerns        Cognition Comprehension Comprehension assist level: Understands basic 25 - 49% of the time/ requires cueing 50 - 75% of the time  Expression Expression assist level: Expresses basis less than 25% of the time/requires cueing >75% of the time.  Social Interaction Social Interaction assist level: Interacts appropriately 25 - 49% of time - Needs frequent redirection.  Problem Solving Problem solving assist level: Solves basic less than 25% of the time - needs direction nearly all the time or does not effectively solve problems and may need a restraint for safety  Memory Memory assist level: Recognizes or recalls 25 - 49% of the time/requires cueing 50 - 75% of the time   Medical Problem List and Plan: 1.  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  DVT Prophylaxis/Anticoagulation: Mechanical: Sequential compression devices, below knee Bilateral lower extremities 3. Pain Management: Hydrocodone prn. Monitor for sedation.  4. Mood: [name] to follow for evaluation and support as mentation improves.  5. Neuropsych: This  patient is not capable of making decisions on his own behalf.  -hold off on stimulant given improved arousal and attention 6. Skin/Wound Care: routine skin care 7. Fluids/Electrolytes/Nutrition: Monitor I/O. Increase water flushes  8. Right displaced sacral and right pubic rami fracture: WBAT 9. Right clavicle fracture: NWB --to keep arm below 90 degrees and sling when out of bed. 10. Acute urinary retention: Monitor voiding with PVR checks.  toilet patient every 4 hours.   -ua neg, ucx pending 11. MSSA RLL PNA: Continues to have low grade fevers. Will continue rocephin Day # 10/10.    -afebrile this morning, aspiration precautions, continue to follow closely 12. Pre-renal azotemia: changed tube feed to Nephro. Increased water flushes to qid.   -follow up labs tomorrow 13. HTN: On metoprolol bid.  Fair control at present    LOS (Days) 2 A FACE TO FACE EVALUATION WAS PERFORMED  Ranelle OysterSWARTZ,Dacota Devall T, MD 07/04/2016 9:14 AM

## 2016-07-04 NOTE — Progress Notes (Signed)
Speech Language Pathology Daily Session Note  Patient Details  Name: Tyler Lane: [id number] Date of Birth: 1950/06/08  Today's Date: 07/04/2016 SLP Individual Time: 0725-0820 SLP Individual Time Calculation (min): 55 min  Short Term Goals: Week 1: SLP Short Term Goal 1 (Week 1): Patient will follow commands in 50% of opportunities with Max A verbal cues.  SLP Short Term Goal 2 (Week 1): Patient will orient to place and situation with Max A verbal and visual cues.  SLP Short Term Goal 3 (Week 1): Patient will demonstrate sustained attention to a functional task for ~2 minutes with Max A multimodal cues.  SLP Short Term Goal 4 (Week 1): Patient will request assistance/make needs known during functional tasks with Max A verbal cues.  SLP Short Term Goal 5 (Week 1): Patient will consume trials of ice chips with Mod assist multimodal cues for initiation of swallows in less thean 30 seconds with minimal overt s/s of aspiration.  Skilled Therapeutic Interventions: Skilled treatment session focused on cognitive and dysphagia goals. Upon arrival, patient was awake, alert and verbally responsive. Patient required total A for orientation to place, time, situation and patient's age. Patient also required Max-Total A to identify family members in a familiar picture. Patient consumed trials of ice chips and thin liquids via spoon without overt s/s of aspiration but required Mod A verbal cues for swallow initiation. Patient with increased verbal expression and social engagement this session and was making requests for wants/needs. Patient left semi-reclined in bed with all needs within reach. Continue with current plan of care.      Function:  Eating Eating Eating activity did not occur: Safety/medical concerns Modified Consistency Diet: Yes Eating Assist Level: Helper feeds patient;Supervision or verbal cues;Set up assist for   Eating Set Up Assist For: Parenteral or tube feed supplies        Cognition Comprehension Comprehension assist level: Understands basic less than 25% of the time/ requires cueing >75% of the time  Expression   Expression assist level: Expresses basic 25 - 49% of the time/requires cueing 50 - 75% of the time. Uses single words/gestures.  Social Interaction Social Interaction assist level: Interacts appropriately 25 - 49% of time - Needs frequent redirection.  Problem Solving Problem solving assist level: Solves basic 25 - 49% of the time - needs direction more than half the time to initiate, plan or complete simple activities  Memory Memory assist level: Recognizes or recalls less than 25% of the time/requires cueing greater than 75% of the time    Pain Pain Assessment Pain Assessment: No/denies pain   Therapy/Group: Individual Therapy  Tyler Lane 07/04/2016, 3:46 PM

## 2016-07-04 NOTE — Plan of Care (Signed)
Problem: RH BLADDER ELIMINATION Goal: RH STG MANAGE BLADDER WITH ASSISTANCE Patient will be able to manage with minimal -assist  Outcome: Not Progressing Patient is incontinent

## 2016-07-04 NOTE — Progress Notes (Signed)
Occupational Therapy Session Note  Patient Details  Name: Tyler Lane MRN: [id number] Date of Birth: 1950-09-04  Today's Date: 07/04/2016 OT Individual Time: [id number] OT Individual Time Calculation (min): 58 min    Short Term Goals: Week 1:  OT Short Term Goal 1 (Week 1): Pt will perform UB dressing with mod A to decrease level of assistance with self care.  OT Short Term Goal 2 (Week 1): Pt will sit EOB to wash UB with min A for dynamic sitting balance. OT Short Term Goal 3 (Week 1): Pt will stand with mod A stand balance for LB clothing management.  Skilled Therapeutic Interventions/Progress Updates:    Upon entering the room, pt supine in bed with RN present giving medications. Pt with no c/o, signs, or symptoms of pain this session. Skilled OT intervention with focus on R inattention, self care retraining, initiation, and sequencing of routine tasks. Pt perseverates on washing face throughout session and required hand over hand assist to initiate washing other areas of body. Items handed to pt from R side with max multimodal cues to turn head to R and locate items for use. Pt given toothbrush connected to suction with pt taking 4 minutes to initiate putting in mouth for oral care. Pt given chapstick and attempts to put up nose and required hand over hand assistance to put on lips. Pt oriented to self only this session. Bed alarm activated and call bell within reach upon exiting the room.   Therapy Documentation Precautions:  Precautions Precautions: Fall Precaution Comments: R shoulder sling Other Brace/Splint: has cortrax feeding tube Restrictions Weight Bearing Restrictions: Yes RUE Weight Bearing: Non weight bearing RLE Weight Bearing: Weight bearing as tolerated LLE Weight Bearing: Weight bearing as tolerated General: General PT Missed Treatment Reason: Patient fatigue Vital Signs:  Pain: Pain Assessment Pain Assessment: No/denies pain Pain Score: 10-Worst pain  ever Pain Type: Acute pain Pain Location: Flank Pain Orientation: Right Pain Descriptors / Indicators: Aching Pain Onset: On-going Patients Stated Pain Goal: 2 Pain Intervention(s): Medication (See eMAR);Repositioned Multiple Pain Sites: No ADL:   Vision   Perception    Praxis   Exercises:   Other Treatments:    See Function Navigator for Current Functional Status.   Therapy/Group: Individual Therapy  Tyler Lane, Tyler Lane P 07/04/2016, 4:24 PM

## 2016-07-04 NOTE — Progress Notes (Signed)
Nutrition Follow-up  DOCUMENTATION CODES:   Not applicable  INTERVENTION:  Continue Nepro formula via Cortrak NGT at goal rate of 55 ml/hr x 20 hours (may hold TF for up to 4 hours for therapy) with 30 ml Prostat BID to provide 2180 kcal (100% of needs), 119 grams of protein, and 803 ml of free water.   Continue free water flushes of 200 ml every 6 hours via tube. Total free water: 1603 ml/day  RD to continue to monitor.   NUTRITION DIAGNOSIS:   Inadequate oral intake related to inability to eat as evidenced by NPO status; ongoing  GOAL:   Patient will meet greater than or equal to 90% of their needs; met  MONITOR:   TF tolerance, Weight trends, Labs, Skin, I & O's  REASON FOR ASSESSMENT:   Consult Enteral/tube feeding initiation and management  ASSESSMENT:   66 y.o. male with history of HTN, depression, hearing loss who was admitted on 06/21/16 after unwitnessed fall off 10' ladder. CT head done showing mild acute SAH along sylvian fissures bilaterally, at high left parietal lobe and righ frontal lobe. Work up also revealed displaced fracture of right mid clavicle, right 2nd to 6 rib fractures with pulmonary contusion, contusion, mildly displaced comminuted fracture of right pubic ramus and comminuted transverse fracture of proximal right pubic ramus and compression deformity of 6th and 7th thoracic vertebral body.  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  Pt has been tolerating his tube feeds with no other difficulties. RD to continue with current orders. Pt reports  usual body weight is unknown to him. Question accuracy of recently recorded weight. RD to monitor weights closely.   Nutrition-Focused physical exam completed. Findings are no fat depletion, mild to moderate muscle depletion, and no edema.   Labs and medications reviewed.   Diet Order:  Diet NPO time specified  Skin:  Reviewed, no issues  Last BM:  5/22  Height:   Ht Readings from Last 1 Encounters:  06/21/16 _0  (1.803 m)    Weight:   Wt Readings from Last 1 Encounters:  07/04/16 198 lb 6.6 oz (90 kg)    Ideal Body Weight:  78 kg  BMI:  Body mass index is 27.67 kg/m.  Estimated Nutritional Needs:   Kcal:  2050-2250  Protein:  110-125 grams  Fluid:  >2 L/day  EDUCATION NEEDS:   No education needs identified at this time  Corrin Parker, MS, RD, LDN Pager # [phone] After hours/ weekend pager # [phone]

## 2016-07-05 ENCOUNTER — Inpatient Hospital Stay (HOSPITAL_COMMUNITY): Payer: Medicare HMO | Admitting: Speech Pathology

## 2016-07-05 ENCOUNTER — Inpatient Hospital Stay (HOSPITAL_COMMUNITY): Payer: Medicare HMO

## 2016-07-05 ENCOUNTER — Inpatient Hospital Stay (HOSPITAL_COMMUNITY): Payer: Medicare HMO | Admitting: Occupational Therapy

## 2016-07-05 LAB — BASIC METABOLIC PANEL
ANION GAP: 8 (ref 5–15)
BUN: 33 mg/dL — ABNORMAL HIGH (ref 6–20)
CHLORIDE: 108 mmol/L (ref 101–111)
CO2: 25 mmol/L (ref 22–32)
CREATININE: 0.85 mg/dL (ref 0.61–1.24)
Calcium: 9 mg/dL (ref 8.9–10.3)
GFR calc non Af Amer: >60 mL/min (ref >60)
Glucose, Bld: 108 mg/dL — ABNORMAL HIGH (ref 65–99)
Potassium: 4.3 mmol/L (ref 3.5–5.1)
Sodium: 141 mmol/L (ref 135–145)

## 2016-07-05 LAB — GLUCOSE, CAPILLARY
GLUCOSE-CAPILLARY: 119 mg/dL — AB (ref 65–99)
GLUCOSE-CAPILLARY: 119 mg/dL — AB (ref 65–99)
GLUCOSE-CAPILLARY: 125 mg/dL — AB (ref 65–99)
GLUCOSE-CAPILLARY: 126 mg/dL — AB (ref 65–99)
Glucose-Capillary: 111 mg/dL — ABNORMAL HIGH (ref 65–99)
Glucose-Capillary: 115 mg/dL — ABNORMAL HIGH (ref 65–99)

## 2016-07-05 NOTE — Progress Notes (Signed)
Social Work  Social Work Assessment and Plan  Patient Details  Name: Tyler Lane MRN: [id number] Date of Birth: 04-Sep-1950  Today's Date: 07/05/2016  Problem List:  Patient Active Problem List   Diagnosis Date Noted  . SAH (subarachnoid hemorrhage) (HCC) 07/02/2016  . Intracerebral hemorrhage, intraventricular (HCC) 07/02/2016  . Right clavicle fracture 07/02/2016  . Fracture of multiple ribs 07/02/2016  . Sacral fracture (HCC) 07/02/2016  . Chest trauma   . Closed fracture of one rib   . Urinary retention   . Pneumonia of right lower lobe due to methicillin susceptible Staphylococcus aureus (MSSA) (HCC)   . Prerenal azotemia   . Diffuse axonal brain injury (HCC)   . Fracture of clavicle, right, closed 06/22/2016  . TBI (traumatic brain injury) (HCC) 06/21/2016   Past Medical History:  Past Medical History:  Diagnosis Date  . Depression   . Heart murmur    previously  . High cholesterol   . HOH (hard of hearing)    mild in the right and moderate to severe in the left  . Hypertension   . Tinnitus of left ear    Past Surgical History:  Past Surgical History:  Procedure Laterality Date  . NASAL SINUS SURGERY     Social History:  reports that he has been smoking Cigarettes.  He has a 20.00 pack-year smoking history. He has never used smokeless tobacco. He reports that he does not drink alcohol or use drugs.  Family / Support Systems Marital Status: Single (together with girlfriend x 21 yrs and they refer to each other as spouses) Patient Roles: Parent, Other (Comment) Spouse/Significant Other: s/o, Genice Rouge @ [phone] or ([phone] Children: daughter, Yehuda Budd @ [phone];  daughter, Tahmid [facility]) @ ([phone] Anticipated Caregiver: Daughter and SO Ability/Limitations of Caregiver: Dtr works from home M-F 4Pm to 7:30 am and SO works evenings.  Dtr works from home. Caregiver Availability: 24/7 Family Dynamics: Daughter notes very  good relationship with her father's long-term s/o and refers to her as her "step-mother".   She notes that she will provide as much support to her and pt as she can upon  d/c.  Daughter has good rapport and laughing with pt.  Very attentive.  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  Defer to daughters (s/o)   Abuse/Neglect Physical Abuse: Denies Verbal Abuse: Denies Sexual Abuse: Denies Exploitation of patient/patient's resources: Denies Self-Neglect: Denies  Emotional Status Pt's affect, behavior adn adjustment status: Pt with confusion due to TBI but does attempt to answer some questions. Daughter assisting with completion of interview assessment.  He does not appear to be in any emotional distress, however, frustrated with NG tube. Will involve neuropsychology when more appropriate for cogntion/ coping support. Recent Psychosocial Issues: None Pyschiatric History: None Substance Abuse History: None  Patient / Family Perceptions, Expectations & Goals Pt/Family understanding of illness & functional limitations: Pt is able to state that he is in a hospital and that he fell.  Asked if he suffered a head injury and he nods "yes".  Daughter with general understanding of his TBI and current cognitive and physical deficits. Premorbid pt/family roles/activities: Pt was completely independent and active home and community. Anticipated changes in roles/activities/participation: Pt expected to require min assist upon d/c  and 24/7 care - family to assume caregiver duties to be shared between daughter and s/o. Pt/family expectations/goals: "We just want him to get as good as he can."  [employer] Community Agencies:  None Premorbid Home Care/DME Agencies: None Transportation available at discharge: yes Resource referrals recommended: Neuropsychology, Support group (specify)  Discharge Planning Living Arrangements: Spouse/significant other Support Systems: Spouse/significant other, Children Type of Residence: Private residence Insurance Resources: Furniture conservator/restorerMedicare ([facility]) Financial Resources: Restaurant manager, fast foodocial Security Financial Screen Referred: No Living Expenses: Own Money Management: Patient Does the patient have any problems obtaining your medications?: No Home Management: pt and s/o Patient/Family Preliminary Plans: Plans on admit are for pt to d/c home with s/o and she and daughter will coordinate 24/7 support Barriers to Discharge:  (both s/o and daughter are working) Social Work Anticipated Follow Up Needs: HH/OP, Support Group Expected length of stay: 21-25 days  Clinical Impression Unfortunate gentleman here after a fall and with multiple injuries including TBI.  With confusion but does attempt to answer some questions and daughter assisting with completion of interview.  Will monitor emotional adjustment but, currently, not showing any emotional distress.  Family aware need for 24/7 support at d/c.  Will for support and d/c planning needs.  Carsen Leaf 07/05/2016, 4:24 PM

## 2016-07-05 NOTE — Progress Notes (Signed)
Occupational Therapy Session Note  Patient Details  Name: Tyler AskewDavid Ghanem MRN: [id number] Date of Birth: [birth year]  Today's Date: 07/05/2016 OT Individual Time: 1003-1100 OT Individual Time Calculation (min): 57 min   Short Term Goals: Week 1:  OT Short Term Goal 1 (Week 1): Pt will perform UB dressing with mod A to decrease level of assistance with self care.  OT Short Term Goal 2 (Week 1): Pt will sit EOB to wash UB with min A for dynamic sitting balance. OT Short Term Goal 3 (Week 1): Pt will stand with mod A stand balance for LB clothing management.  Skilled Therapeutic Interventions/Progress Updates:    OT treatment session focused on attention, initiation, sitting balance, and standing tolerance within BADL tasks. Pt more alert this morning ,but disoriented to place and situation, daughter present throughout session. Max A to initiate UB dressing in bed while RN paused feeding tube to pull through shirt. Pt incontinent of bowel, max A rolling with increased pain and difficulty maintaining sidelying for total A peri-care. Sup <>sit w/ Max +2 to bring LEs off bed and elevate trunk. Progressed to min A sitting EOB. Donned R sling to attempt to keep pt from using R UE. Max A to thread B LE's into pants ,then used Stedy for Max A +2 sit<>stand and assist to pull up pants. Max cues and hand over hand A to keep pt from using R UE when standing. Transferred pt to wheelchair and left pt with safety belt on and daughter present.   Therapy Documentation Precautions:  Precautions Precautions: Fall Precaution Comments: R shoulder sling Other Brace/Splint: has cortrax feeding tube Restrictions Weight Bearing Restrictions: Yes RUE Weight Bearing: Non weight bearing RLE Weight Bearing: Weight bearing as tolerated LLE Weight Bearing: Weight bearing as tolerated Pain: Pain Assessment Pain Assessment: Faces Faces Pain Scale: Hurts little more Pain Type: Acute pain Pain Location: Flank Pain  Orientation: Right Pain Descriptors / Indicators: Aching Pain Onset: On-going Pain Intervention(s): Repositioned  See Function Navigator for Current Functional Status.   Therapy/Group: Individual Therapy  Mal Amabilelisabeth S Anoushka Divito 07/05/2016, 4:46 PM

## 2016-07-05 NOTE — IPOC Note (Signed)
Overall Plan of Care [facility]) Patient Details Name: Tyler Lane MRN: [id number] DOB: 05-30-1950  Admitting Diagnosis: tbi multitrauma  Hospital Problems: Principal Problem:   Diffuse axonal brain injury (HCC) Active Problems:   Fracture of clavicle, right, closed   Urinary retention   Pneumonia of right lower lobe due to methicillin susceptible Staphylococcus aureus (MSSA) (HCC)   Prerenal azotemia     Functional Problem List: Nursing Bladder, Bowel, Endurance, Pain, Nutrition, Safety, Medication Management  PT Balance, Endurance, Motor, Safety  OT Balance, Behavior, Cognition, Endurance, Motor, Pain, Perception, Safety  SLP Nutrition  TR         Basic ADL's: OT Grooming, Bathing, Dressing, Toileting     Advanced  ADL's: OT       Transfers: PT Bed Mobility, Bed to Chair, Car, Furniture, Floor  OT Toilet, Research scientist (life sciences): PT Ambulation, Psychologist, prison and probation services, Stairs     Additional Impairments: OT None  SLP Swallowing   Social Interaction, Attention, Problem Solving, Memory, Awareness  TR      Anticipated Outcomes Item Anticipated Outcome  Self Feeding    Swallowing  Min-Mod A   Basic self-care  min A  Toileting  min A   Bathroom Transfers min A  Bowel/Bladder  Continent of bowel/bladder w/o issues or concerns retentionor s/s infection  Transfers  min assist with LRAD  Locomotion  Min assist with LRAD  Communication     Cognition  Min-Mod A   Pain  Mantain a pain score of 2/10 on pain scal  Safety/Judgment  No incidence of fall or injuries during this admission on rehab    Therapy Plan: PT Intensity: Minimum of 1-2 x/day ,45 to 90 minutes PT Frequency: 5 out of 7 days PT Duration Estimated Length of Stay: 21-24 days OT Intensity: Minimum of 1-2 x/day, 45 to 90 minutes OT Frequency: 5 out of 7 days OT Duration/Estimated Length of Stay: 21-24 days SLP Intensity: Minumum of 1-2 x/day, 30 to 90 minutes SLP Frequency: 3 to 5 out of 7  days SLP Duration/Estimated Length of Stay: 21-24 days        Team Interventions: Nursing Interventions Patient/Family Education, Bladder Management, Bowel Management, Medication Management, Pain Management, Disease Management/Prevention, Dysphagia/Aspiration Precaution Training, Discharge Planning, Psychosocial Support  PT interventions Ambulation/gait training, Warden/ranger, Cognitive remediation/compensation, Community reintegration, Discharge planning, Disease management/prevention, DME/adaptive equipment instruction, Functional mobility training, Neuromuscular re-education, Patient/family education, Psychosocial support, Stair training, Therapeutic Activities, Therapeutic Exercise, UE/LE Strength taining/ROM, UE/LE Coordination activities, Wheelchair propulsion/positioning  OT Interventions Warden/ranger, Firefighter, Discharge planning, Cognitive remediation/compensation, Neuromuscular re-education, Self Care/advanced ADL retraining, Therapeutic Exercise, Wheelchair propulsion/positioning, UE/LE Strength taining/ROM, DME/adaptive equipment instruction, Pain management, Patient/family education, UE/LE Coordination activities, Functional mobility training, Psychosocial support, Therapeutic Activities  SLP Interventions Dysphagia/aspiration precaution training, Environmental controls, Cueing hierarchy, Internal/external aids, Patient/family education  TR Interventions    SW/CM Interventions Discharge Planning, Psychosocial Support, Patient/Family Education    Team Discharge Planning: Destination: PT-Home ,OT- Home , SLP-Home Projected Follow-up: PT-Home health PT, 24 hour supervision/assistance, OT-  Home health OT, SLP-24 hour supervision/assistance, Home Health SLP, Outpatient SLP Projected Equipment Needs: PT-To be determined, OT- To be determined, SLP-To be determined Equipment Details: PT- , OT-  Patient/family involved in discharge planning: PT-  Patient unable/family or caregiver not available,  OT-Patient unable/family or caregiver not available, SLP-Patient unable/family or caregive not available  MD ELOS: 21-24 days Medical Rehab Prognosis:  Excellent Assessment: The patient has been admitted for CIR therapies with the diagnosis of TBI  with polytrauma. The team will be addressing functional mobility, strength, stamina, balance, safety, adaptive techniques and equipment, self-care, bowel and bladder mgt, patient and caregiver education, NMR, cognitive perceptual rx, swallowing, communication, community reentry, ortho precautions, pain control. Goals have been set at min to mod assist for mobility, self-care, and cognition, communication, mod I with swallowing.    Ranelle OysterZachary T. Hasset Chaviano, MD, FAAPMR      See Team Conference Notes for weekly updates to the plan of care

## 2016-07-05 NOTE — Progress Notes (Signed)
Physical Therapy Session Note  Patient Details  Name: Tyler AskewDavid Lane MRN: [id number] Date of Birth: 02-23-1950  Today's Date: 07/05/2016 PT Individual Time: 1300-1400 PT Individual Time Calculation (min): 60 min   Short Term Goals: Week 1:  PT Short Term Goal 1 (Week 1): Pt to perform supine<>sitting with mod assist.  PT Short Term Goal 2 (Week 1): Pt to perform sit<>stand with max assist and LRAD PT Short Term Goal 3 (Week 1): Pt to ambulate 10 ft with assistance and LRAD.  PT Short Term Goal 4 (Week 1): Pt to sit X 8 min with supervision for balance and trunk control   Skilled Therapeutic Interventions/Progress Updates:   Pt's family reports he has been up all day and c/o his bottom hurting from the w/c trying to get a comfortable position. During session, obtained a TIS w/c and adjusted for improved sitting tolerance and ability for caregivers to provide more efficient pressure relief to patient. Educated daughter on this technique and boosting every 30 min to decrease risk of skin breakdown.  Attempted w/c propulsion with BLE in standard manual w/c with max cues for technique and impaired attention noted. Stand pivot transfer from w/c to mat with mod assist and cues for stepping with extra time for initiation. Sit <> stands x 3 with max assist from EOM while engaging patient in cognitive remediation addressing orientation and awareness. Pt able to verbalize he was in accident but demonstrates impaired intellectual awareness into his deficits and does not recall how he sustained his injuries or that he is NWB in RUE as pt continues to attempt to use it despite max cues not to. Required +2 for final sit <> stand due to fatigue (pt falling asleep) but able to maintain standing with mod assist and ambulate to w/c x 8' with mod assist with +2 for IV pole management and max cues for initiating stepping and mod assist for weightshift. Handoff to SLP for next session.   Therapy  Documentation Precautions:  Precautions Precautions: Fall Precaution Comments: R shoulder sling Other Brace/Splint: has cortrax feeding tube Restrictions Weight Bearing Restrictions: Yes RUE Weight Bearing: Non weight bearing RLE Weight Bearing: Weight bearing as tolerated LLE Weight Bearing: Weight bearing as tolerated  Pain:  reports discomfort in bottom from sitting - repositioned and changed out w/c to improve pressure relief   See Function Navigator for Current Functional Status.   Therapy/Group: Individual Therapy  Karolee StampsGray, Tayvia Faughnan Darrol PokeBrescia  Anthonette Lesage B. Clovis Warwick, PT, DPT  07/05/2016, 2:17 PM

## 2016-07-05 NOTE — Progress Notes (Signed)
Speech Language Pathology Daily Session Note  Patient Details  Name: Tyler AskewDavid Elwell MRN: [id number] Date of Birth: Jan 18, 1951  Today's Date: 07/05/2016 SLP Individual Time: 1400-1430 SLP Individual Time Calculation (min): 30 min  Short Term Goals: Week 1: SLP Short Term Goal 1 (Week 1): Patient will follow commands in 50% of opportunities with Max A verbal cues.  SLP Short Term Goal 2 (Week 1): Patient will orient to place and situation with Max A verbal and visual cues.  SLP Short Term Goal 3 (Week 1): Patient will demonstrate sustained attention to a functional task for ~2 minutes with Max A multimodal cues.  SLP Short Term Goal 4 (Week 1): Patient will request assistance/make needs known during functional tasks with Max A verbal cues.  SLP Short Term Goal 5 (Week 1): Patient will consume trials of ice chips with Mod assist multimodal cues for initiation of swallows in less thean 30 seconds with minimal overt s/s of aspiration.  Skilled Therapeutic Interventions: Skilled treatment session focused on dysphagia and cognition goals. SLP facilitated session by providing skilled observation of pt consuming ice chips and thin water following oral care. Pt orally held trials of thin liquids for ~ 3 seconds consistently. Pt with wet voice x 1 and was able to throat clear and reswallow with Mod A cues. These strategies were able to help clear throat. Pt able to demonstrate sustained attention when holding dry spoon and cup along with Max A cues for increments of ~2 minutes. Daughter present during session and education provided on current swallow abilities (i.e., holding thin liquids) and the impact that his cognitive abilities have on increasing his aspiration risk (inability to follow directions/decreased sustained attention/lethergy). Pt was left upright in wheelchair, daughter present and all needs within reach.      Function:  Eating Eating   Modified Consistency Diet: No (Trials of thin liquids  with SLP only) Eating Assist Level: Helper feeds patient;Supervision or verbal cues           Cognition Comprehension Comprehension assist level: Understands basic less than 25% of the time/ requires cueing >75% of the time  Expression   Expression assist level: Expresses basic 25 - 49% of the time/requires cueing 50 - 75% of the time. Uses single words/gestures.  Social Interaction Social Interaction assist level: Interacts appropriately 25 - 49% of time - Needs frequent redirection.  Problem Solving Problem solving assist level: Solves basic 25 - 49% of the time - needs direction more than half the time to initiate, plan or complete simple activities  Memory Memory assist level: Recognizes or recalls less than 25% of the time/requires cueing greater than 75% of the time    Pain    Therapy/Group: Individual Therapy   Jannah Guardiola B. Dreama Saaverton, M.S., CCC-SLP Speech-Language Pathologist   Krystyl Cannell 07/05/2016, 3:55 PM

## 2016-07-05 NOTE — Progress Notes (Signed)
Social Work Patient ID: Tyler Lane, male   DOB: 02/22/1950, 65 y.o.   MRN: [id number]   Met with pt and daughter, [name], yesterday afternoon to review team conference.  Daughter aware of target LOS 21-24 days with min assist goals anticipated.  We discussed insurance covered services and daughter notes family working on a plan for 24/7 care.  Will continue to follow.  , , [name]  

## 2016-07-05 NOTE — Progress Notes (Signed)
Speech Language Pathology Daily Session Notes  Patient Details  Name: Jannet AskewDavid Evola MRN: [id number] Date of Birth: 08-03-1950  Today's Date: 07/05/2016  Session 1: SLP Individual Time: 7829-56210655-0720 SLP Individual Time Calculation (min): 25 min   Session 2: SLP Individual Time: 0930-1005 SLP Individual Time Calculation (min): 35 min  Short Term Goals: Week 1: SLP Short Term Goal 1 (Week 1): Patient will follow commands in 50% of opportunities with Max A verbal cues.  SLP Short Term Goal 2 (Week 1): Patient will orient to place and situation with Max A verbal and visual cues.  SLP Short Term Goal 3 (Week 1): Patient will demonstrate sustained attention to a functional task for ~2 minutes with Max A multimodal cues.  SLP Short Term Goal 4 (Week 1): Patient will request assistance/make needs known during functional tasks with Max A verbal cues.  SLP Short Term Goal 5 (Week 1): Patient will consume trials of ice chips with Mod assist multimodal cues for initiation of swallows in less thean 30 seconds with minimal overt s/s of aspiration.  Skilled Therapeutic Interventions:  Session 1: Skilled treatment session focused on cognitive goals. Upon arrival, patient was awake but required Mod verbal and tactile cues throughout session to maintain alertness for ~60 second intervals due to fatigue. Patient performed basic self-care tasks such as wiping his face, brushing his teeth via the suction toothbrush and applying chapstick with Mod A verbal cues for initiation and Max-total A verbal and tactile cues for thoroughness. Patient also required total A for orientation with increased language of confusion this session. Patient left semi-reclined in bed with all needs within reach. Continue with current plan of care.   Session 2:  Skilled treatment session focused on dysphagia goals. SLP facilitates session by administering trials of ice chips after oral care. Patient consumed trials of ice chips via spoon  without overt s/s of aspiration but required Mod A verbal cues for swallow initiation. Patient's daughter present and educated on current swallowing function and goals of skilled SLP intervention. She verbalized understanding but will need reinforcement. Patient left semi-reclined in bed with all needs within reach. Continue with current plan of care.      Function:  Eating Eating   Modified Consistency Diet: Yes Eating Assist Level: Helper feeds patient;Supervision or verbal cues;Set up assist for           Cognition Comprehension Comprehension assist level: Understands basic less than 25% of the time/ requires cueing >75% of the time  Expression   Expression assist level: Expresses basic 25 - 49% of the time/requires cueing 50 - 75% of the time. Uses single words/gestures.  Social Interaction Social Interaction assist level: Interacts appropriately 25 - 49% of time - Needs frequent redirection.  Problem Solving Problem solving assist level: Solves basic 25 - 49% of the time - needs direction more than half the time to initiate, plan or complete simple activities  Memory Memory assist level: Recognizes or recalls less than 25% of the time/requires cueing greater than 75% of the time    Pain No/Denies Pain   Therapy/Group: Individual Therapy  Elen Acero 07/05/2016, 3:03 PM

## 2016-07-05 NOTE — Care Management Note (Signed)
[facility]patient [facility] Individual Statement of Services  Patient Name:  Jannet AskewDavid Langlinais  Date:  07/05/2016  Welcome to the [facility].  Our goal is to provide you with an individualized program based on your diagnosis and situation, designed to meet your specific needs.  With this comprehensive rehabilitation program, you will be expected to participate in at least 3 hours of rehabilitation therapies Monday-Friday, with modified therapy programming on the weekends.  Your rehabilitation program will include the following services:  Physical Therapy (PT), Occupational Therapy (OT), Speech Therapy (ST), 24 hour per day rehabilitation nursing, Therapeutic Recreaction (TR), Neuropsychology, Case Management (Social Worker), Rehabilitation Medicine, Nutrition Services and Pharmacy Services  Weekly team conferences will be held on Tuesdays to discuss your progress.  Your Social Worker will talk with you frequently to get your input and to update you on team discussions.  Team conferences with you and your family in attendance may also be held.  Expected length of stay: 21-24 days  Overall anticipated outcome: minimal assist  Depending on your progress and recovery, your program may change. Your Social Worker will coordinate services and will keep you informed of any changes. Your Social Worker's name and contact numbers are listed  below.  The following services may also be recommended but are not provided by the Inpatient [facility]:   Driving Evaluations  Home Health Rehabiltiation Services  Outpatient Rehabilitation Services    Arrangements will be made to provide these services after discharge if needed.  Arrangements include referral to agencies that provide these services.  Your insurance has been verified to be:  [employer] Your primary doctor is:  None  Pertinent information will be shared with your doctor and your insurance company.  Social  Worker:  [place]ena [place], [place] [phone] or ([phone]) [phone]   Information discussed with and copy given to patient by: Amada JupiterHOYLE, Raynelle Fujikawa, 07/05/2016, 4:33 PM

## 2016-07-05 NOTE — Progress Notes (Signed)
[facility] PHYSICAL MEDICINE & REHABILITATION     PROGRESS NOTE    Subjective/Complaints:  Slept well again. No new complaints  ROS: Limited due cognitive/behavioral  Objective: Vital Signs: Blood pressure (!) 142/92, pulse 74, temperature 97.3 F (36.3 C), temperature source Oral, resp. rate 18, weight 70.3 kg (154 lb 15.7 oz), SpO2 94 %. Dg Chest 2 View  Result Date: 07/03/2016 CLINICAL DATA:  Shortness of breath. The patient suffered multiple rib fractures of fall from a ladder 06/21/2016. EXAM: CHEST  2 VIEW COMPARISON:  PA and lateral chest 06/29/2016. Single view of the chest 06/28/2016. CT chest 06/21/2016. FINDINGS: Right pleural effusion and basilar airspace disease have markedly improved. The left lung is clear. There is no pneumothorax. Heart size is normal. Aortic atherosclerosis is noted. Multiple right rib fractures are again seen. Feeding tube courses into the duodenum and below the inferior margin of the film. IMPRESSION: Marked improvement in a right pleural effusion basilar airspace disease. Multiple right rib fractures as seen on prior examinations. No pneumothorax. Electronically Signed   By: Drusilla Kannerhomas  Dalessio M.D.   On: 07/03/2016 15:36    Recent Labs  07/03/16 0540  WBC 16.1*  HGB 15.8  HCT 47.0  PLT 408*    Recent Labs  07/03/16 0540 07/05/16 0622  NA 143 141  K 3.7 4.3  CL 110 108  GLUCOSE 119* 108*  BUN 35* 33*  CREATININE 0.80 0.85  CALCIUM 8.9 9.0   CBG (last 3)   Recent Labs  07/04/16 2023 07/05/16 0011 07/05/16 0358  GLUCAP 115* 126* 119*    Wt Readings from Last 3 Encounters:  07/05/16 70.3 kg (154 lb 15.7 oz)  07/02/16 74.3 kg (163 lb 12.8 oz)    Physical Exam:  Constitutional: He appears well-developed and well-nourished.   disheveled    HENT:  Head: Normocephalic.  Right Ear: External ear normal.  Left Ear: External ear normal.  Mouth/Throat: Oropharynx is clear and moist.  Eyes: Conjunctivae and EOM are normal. Pupils  are equal, round, and reactive to light. Right eye exhibits no discharge. Left eye exhibits no discharge.  Neck: Normal range of motion. Neck supple.  Cardiovascular: RRR Respiratory: CTA B GI: Soft. Bowel sounds are normal. He exhibits no distension. There is no tenderness.  Musculoskeletal: right shoulder tender, in sling Neurological: He is alert.  Better attention/eye contact.  Oriented to name only. Confused language.  Motor  : moves all 4.  A&Ox1 HOH  Skin: Skin is warm and dry.  Psychiatric: blunt and inattentive   Assessment/Plan: 1. 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 Physiatrist is providing close team supervision and 24 hour management of active medical problems listed below. Physiatrist and rehab team continue to assess barriers to discharge/monitor patient progress toward functional and medical goals.  Function:  Bathing Bathing position Bathing activity did not occur: Refused Position: Bed (UB only)  Bathing parts Body parts bathed by patient: Chest Body parts bathed by helper: Right arm, Left arm  Bathing assist Assist Level:  (max A)      Upper Body Dressing/Undressing Upper body dressing   What is the patient wearing?: Hospital gown                Upper body assist Assist Level: Touching or steadying assistance(Pt > 75%), Set up      Lower Body Dressing/Undressing Lower body dressing   What is the patient  wearing?: Non-skid slipper socks           Non-skid slipper socks- Performed by helper: Don/doff right sock, Don/doff left sock                  Lower body assist        Toileting Toileting Toileting activity did not occur: No continent bowel/bladder event   Toileting steps completed by helper: Adjust clothing prior to toileting, Performs perineal hygiene, Adjust clothing after toileting    Toileting assist Assist  level: Two helpers   Transfers Chair/bed transfer Chair/bed transfer activity did not occur: Safety/medical concerns Chair/bed transfer method: Squat pivot Chair/bed transfer assist level: Maximal assist (Pt 25 - 49%/lift and lower)       Locomotion Ambulation Ambulation activity did not occur: Safety/medical concerns         Wheelchair Wheelchair activity did not occur: Safety/medical concerns        Cognition Comprehension Comprehension assist level: Understands basic less than 25% of the time/ requires cueing >75% of the time  Expression Expression assist level: Expresses basic 25 - 49% of the time/requires cueing 50 - 75% of the time. Uses single words/gestures.  Social Interaction Social Interaction assist level: Interacts appropriately 25 - 49% of time - Needs frequent redirection.  Problem Solving Problem solving assist level: Solves basic 25 - 49% of the time - needs direction more than half the time to initiate, plan or complete simple activities  Memory Memory assist level: Recognizes or recalls less than 25% of the time/requires cueing greater than 75% of the time   Medical Problem List and Plan: 1.  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  DVT Prophylaxis/Anticoagulation: Mechanical: Sequential compression devices, below knee Bilateral lower extremities 3. Pain Management: Hydrocodone prn. Monitor for sedation.  4. Mood: [name] to follow for evaluation and support as mentation improves.  5. Neuropsych: This patient is not capable of making decisions on his own behalf.  -hold stimulant for now 6. Skin/Wound Care: routine skin care 7. Fluids/Electrolytes/Nutrition: Monitor I/O. Increase water flushes  8. Right displaced sacral and right pubic rami fracture: WBAT 9. Right clavicle fracture: NWB --to keep arm below 90 degrees and sling when out of bed. 10. Acute urinary retention: Monitor voiding with PVR checks.   toilet patient every 4 hours.   -ua neg, ucx pending 11. MSSA RLL PNA: Continues to have low grade fevers. Rocephin complete    -afebrile this morning, aspiration precautions, continue to follow closely 12. Pre-renal azotemia: changed tube feed to Nephro. Increased water flushes to qid.   -I personally reviewed the patient's labs today.   -BUN 33, holding steady---continue same h2o flushes 13. HTN: On metoprolol bid.  Fair control at present    LOS (Days) 3 A FACE TO FACE EVALUATION WAS PERFORMED  Ranelle Oyster, MD 07/05/2016 9:44 AM

## 2016-07-06 ENCOUNTER — Inpatient Hospital Stay (HOSPITAL_COMMUNITY): Payer: Medicare HMO | Admitting: Physical Therapy

## 2016-07-06 ENCOUNTER — Inpatient Hospital Stay (HOSPITAL_COMMUNITY): Payer: Medicare HMO | Admitting: Speech Pathology

## 2016-07-06 ENCOUNTER — Inpatient Hospital Stay (HOSPITAL_COMMUNITY): Payer: Medicare HMO | Admitting: Occupational Therapy

## 2016-07-06 ENCOUNTER — Inpatient Hospital Stay (HOSPITAL_COMMUNITY): Payer: Medicare HMO

## 2016-07-06 LAB — GLUCOSE, CAPILLARY
GLUCOSE-CAPILLARY: 112 mg/dL — AB (ref 65–99)
GLUCOSE-CAPILLARY: 126 mg/dL — AB (ref 65–99)
GLUCOSE-CAPILLARY: 95 mg/dL (ref 65–99)
Glucose-Capillary: 117 mg/dL — ABNORMAL HIGH (ref 65–99)
Glucose-Capillary: 123 mg/dL — ABNORMAL HIGH (ref 65–99)
Glucose-Capillary: 138 mg/dL — ABNORMAL HIGH (ref 65–99)

## 2016-07-06 NOTE — Progress Notes (Signed)
Modified Barium Swallow Progress Note  Patient Details  Name: Tyler AskewDavid Lane MRN: [id number] Date of Birth: 05/12/1950  Today's Date: 07/06/2016  Modified Barium Swallow completed.  Full report located under Chart Review in the Imaging Section.  Brief recommendations include the following:  Clinical Impression  Patient demonstrates a moderate oral and mild pharyngeal phase dysphagia that is currently exacerbated by cognitive deficits. Patient demonstrates lingual pumping, oral holding, impaired AP transit, impaired bolus cohesion and premature spillage with a delayed swallow initiation to the valleculae of all consistencies due to decreased attention to bolus, which required Max multimodal cues for swallow initiation. Despite oral deficits, patient was able to protect his airway except for only one episode of flash penetration with sequential sips of thin liquids via straw. The straw also appeared to increase the timeliness of his swallow initiation. [facility] strength [facility] Recommend patient remain NPO with NG tube at this time with snacks /trial trays of Dys. 1 textures with thin liquids with SLP only to maximize cognitive function with self-feeding and overall swallowing safety prior to upgrade.  As cognition improves, patient is safe to upgrade solids at bedside.    Swallow Evaluation Recommendations       SLP Diet Recommendations: NPO;Alternative means - temporary                       Oral Care Recommendations: Oral care QID        Arieana Somoza 07/06/2016,12:00 PM

## 2016-07-06 NOTE — Progress Notes (Signed)
[facility] PHYSICAL MEDICINE & REHABILITATION     PROGRESS NOTE    Subjective/Complaints:  Up with therapy. No new complaints  ROS: pt denies nausea, vomiting, diarrhea, cough, shortness of breath or chest pain   Objective: Vital Signs: Blood pressure (!) 127/95, pulse 69, temperature 97.4 F (36.3 C), temperature source Oral, resp. rate 18, weight 70 kg (154 lb 5.2 oz), SpO2 92 %. No results found. No results for input(s): WBC, HGB, HCT, PLT in the last 72 hours.  Recent Labs  07/05/16 0622  NA 141  K 4.3  CL 108  GLUCOSE 108*  BUN 33*  CREATININE 0.85  CALCIUM 9.0   CBG (last 3)   Recent Labs  07/06/16 0011 07/06/16 0417 07/06/16 0905  GLUCAP 123* 117* 138*    Wt Readings from Last 3 Encounters:  07/06/16 70 kg (154 lb 5.2 oz)  07/02/16 74.3 kg (163 lb 12.8 oz)    Physical Exam:  Constitutional: He appears well-developed and well-nourished. No distress    HENT:  Head: Normocephalic.  Right Ear: External ear normal.  Left Ear: External ear normal.  Mouth/Throat: Oropharynx is clear and moist.  Eyes: Conjunctivae and EOM are normal. Pupils are equal, round, and reactive to light. Right eye exhibits no discharge. Left eye exhibits no discharge.  Neck: Normal range of motion. Neck supple.  Cardiovascular: RRR Respiratory: CTA B GI: Soft. Bowel sounds are normal. He exhibits no distension. There is no tenderness.  Musculoskeletal: right shoulder tender, in sling Neurological: He is alert.  Oriented to person. Language of confusion. Follows all commands. Limited insight and awareness.  Motor  : moves all 4.  A&Ox1 HOH  Skin: Skin is warm and dry.  Psychiatric: cooperative, non-agitate, more dynamic  Assessment/Plan: 1. 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 Physiatrist is providing close team supervision and 24 hour management of active  medical problems listed below. Physiatrist and rehab team continue to assess barriers to discharge/monitor patient progress toward functional and medical goals.  Function:  Bathing Bathing position Bathing activity did not occur: Refused Position: Bed (UB only)  Bathing parts Body parts bathed by patient: Chest Body parts bathed by helper: Right arm, Left arm  Bathing assist Assist Level:  (max A)      Upper Body Dressing/Undressing Upper body dressing   What is the patient wearing?: Hospital gown                Upper body assist Assist Level: Touching or steadying assistance(Pt > 75%), Set up      Lower Body Dressing/Undressing Lower body dressing   What is the patient wearing?: Non-skid slipper socks           Non-skid slipper socks- Performed by helper: Don/doff right sock, Don/doff left sock                  Lower body assist        Toileting Toileting Toileting activity did not occur: No continent bowel/bladder event   Toileting steps completed by helper: Adjust clothing prior to toileting, Performs perineal hygiene, Adjust clothing after toileting    Toileting assist Assist level: Two helpers   Transfers Chair/bed transfer Chair/bed transfer activity did not occur: Safety/medical concerns Chair/bed transfer method: Ambulatory Chair/bed transfer assist level: Moderate assist (Pt 50 - 74%/lift or lower) Chair/bed transfer assistive device: Armrests     Locomotion Ambulation Ambulation activity did  not occur: Safety/medical concerns   Max distance: 10 ft Assist level: Moderate assist (Pt 50 - 74%)   Wheelchair Wheelchair activity did not occur: Safety/medical concerns   Max wheelchair distance: 20' Assist Level: Maximal assistance (Pt 25 - 49%) (BLE)  Cognition Comprehension Comprehension assist level: Understands basic less than 25% of the time/ requires cueing >75% of the time  Expression Expression assist level: Expresses basic 25 - 49% of  the time/requires cueing 50 - 75% of the time. Uses single words/gestures.  Social Interaction Social Interaction assist level: Interacts appropriately 25 - 49% of time - Needs frequent redirection.  Problem Solving Problem solving assist level: Solves basic 25 - 49% of the time - needs direction more than half the time to initiate, plan or complete simple activities  Memory Memory assist level: Recognizes or recalls less than 25% of the time/requires cueing greater than 75% of the time   Medical Problem List and Plan: 1.  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  DVT Prophylaxis/Anticoagulation: Mechanical: Sequential compression devices, below knee Bilateral lower extremities 3. Pain Management: Hydrocodone prn. Monitor for sedation.  4. Mood: [name] to follow for evaluation and support as mentation improves.  5. Neuropsych: This patient is not capable of making decisions on his own behalf.  -hold stimulant for now 6. Skin/Wound Care: routine skin care 7. Fluids/Electrolytes/Nutrition: Monitor I/O. Increase water flushes  8. Right displaced sacral and right pubic rami fracture: WBAT 9. Right clavicle fracture: NWB --to keep arm below 90 degrees and sling when out of bed. 10. Acute urinary retention: Monitor voiding with PVR checks.  toilet patient every 4 hours.   -ua neg, ucx neg also 11. MSSA RLL PNA:  . Rocephin complete    -afebrile currently. No clinical signs of active disease 12. Pre-renal azotemia: changed tube feed to Nephro. Increased water flushes to qid.   - BUN 33, holding steady---continue same h2o flushes--recheck monday 13. HTN: On metoprolol bid.  Fair control at present    LOS (Days) 4 A FACE TO FACE EVALUATION WAS PERFORMED  Ranelle Oyster, MD 07/06/2016 10:15 AM

## 2016-07-06 NOTE — Progress Notes (Signed)
Speech Language Pathology Daily Session Note  Patient Details  Name: Tyler Lane MRN: [id number] Date of Birth: 08/16/1950  Today's Date: 07/06/2016 SLP Individual Time: 1430-1500 SLP Individual Time Calculation (min): 30 min  Short Term Goals: Week 1: SLP Short Term Goal 1 (Week 1): Patient will follow commands in 50% of opportunities with Max A verbal cues.  SLP Short Term Goal 2 (Week 1): Patient will orient to place and situation with Max A verbal and visual cues.  SLP Short Term Goal 3 (Week 1): Patient will demonstrate sustained attention to a functional task for ~2 minutes with Max A multimodal cues.  SLP Short Term Goal 4 (Week 1): Patient will request assistance/make needs known during functional tasks with Max A verbal cues.  SLP Short Term Goal 5 (Week 1): Patient will consume trials of ice chips with Mod assist multimodal cues for initiation of swallows in less thean 30 seconds with minimal overt s/s of aspiration. SLP Short Term Goal 5 - Progress (Week 1): Met SLP Short Term Goal 6 (Week 1): Patient will consume trials of Dys. 1 textures with thin liquids without overt s/s of aspiration and Mod A verbal cues for swallow initiation in less than 20% of opportunities over 2 sessions prior to upgrade.   Skilled Therapeutic Interventions: Skilled treatment session focused on dysphagia goals. SLP facilitated session by providing Min A verbal cues for swallow initiation with snack of Dys. 1 textures with thin liquids via cup. Patient consumed trials without overt s/s of aspiration and required Min A verbal cues for sustained attention and initiation with self-feeding for ~10 minutes. Recommend continued trials prior to upgrade. Patient handed off to OT. Continue with current plan of care.      Function:  Eating Eating Eating activity did not occur: Safety/medical concerns Modified Consistency Diet: Yes (with trials from SLP ) Eating Assist Level: Set up assist for;Supervision or  verbal cues   Eating Set Up Assist For: Opening containers       Cognition Comprehension Comprehension assist level: Understands basic less than 25% of the time/ requires cueing >75% of the time  Expression   Expression assist level: Expresses basic 25 - 49% of the time/requires cueing 50 - 75% of the time. Uses single words/gestures.  Social Interaction Social Interaction assist level: Interacts appropriately 25 - 49% of time - Needs frequent redirection.  Problem Solving Problem solving assist level: Solves basic 25 - 49% of the time - needs direction more than half the time to initiate, plan or complete simple activities  Memory Memory assist level: Recognizes or recalls less than 25% of the time/requires cueing greater than 75% of the time    Pain No/Denies Pain   Therapy/Group: Individual Therapy  Zuly Belkin 07/06/2016, 3:41 PM

## 2016-07-06 NOTE — Progress Notes (Signed)
Occupational Therapy Session Note  Patient Details  Name: Tyler AskewDavid Braaksma MRN: [id number] Date of Birth: 09-16-1950  Today's Date: 07/06/2016 OT Individual Time: [id number] and 1311-1340 OT Individual Time Calculation (min): 62 min and 29 min   Short Term Goals: Week 1:  OT Short Term Goal 1 (Week 1): Pt will perform UB dressing with mod A to decrease level of assistance with self care.  OT Short Term Goal 2 (Week 1): Pt will sit EOB to wash UB with min A for dynamic sitting balance. OT Short Term Goal 3 (Week 1): Pt will stand with mod A stand balance for LB clothing management.     Skilled Therapeutic Interventions/Progress Updates:    Tx focus on initiation, attention, adherence to precautions and endurance during functional tasks.   Pt greeted supine in bed and agreeable to get dressed (refused bathing). Pt requiring 5 minutes to select shirt from 3 options. Max A supine<sit at EOB with R UE sling donned for NWB. Pt requiring Min guard-supervision for sitting balance at EOB. Pt completing 4/4 components of UB dressing, even oriented shirt correctly. He initiated threading R UE first.This took him 30 minutes. Pt then returned to supine to doff shorts in prep for donning new pants. Pt incontinent in brief. 2 helpers for rolling to left side for hygiene/donning clean brief/bed linen change. Pt with a great deal of reported pain/grimacing at this time. Pt unable to specify pain location. RN present and notified of this pain. Pt repositioned in bed for comfort, and left with all needs within reach at time of departure. Bed alarm activated.     Pt responding no when asked if he was able to bear weight in R arm. Pt required max multimodal cues to adhere to NWB/<90 degree shoulder flexion precautions.   2nd Session 1:1 tx (29 min) Pt received via SLP handoff. Tx focus on cognitive remediation during horseshoe toss activity. Pt requiring max tactile cues for initiation/sustained attention. Pt  required OT to place horseshoes in left hand in order for him to participate. Max cues for only using L UE. At end of session pt was escorted to RN station. Safety belt donned.  Therapy Documentation Precautions:  Precautions Precautions: Fall Precaution Comments: R shoulder sling Other Brace/Splint: has cortrax feeding tube Restrictions Weight Bearing Restrictions: Yes RUE Weight Bearing: Non weight bearing RLE Weight Bearing: Weight bearing as tolerated LLE Weight Bearing: Weight bearing as tolerated   Vital Signs: Therapy Vitals Temp: 97.3 F (36.3 C) Temp Source: Oral Pulse Rate: (!) 58 Pain: Pt grimacing when rolling to L. Unable to verbalize where he felt pain. RN made aware.    ADL:      See Function Navigator for Current Functional Status.   Therapy/Group: Individual Therapy  Lenon Kuennen A Johana Hopkinson 07/06/2016, 12:49 PM

## 2016-07-06 NOTE — Progress Notes (Addendum)
Physical Therapy Session Note  Patient Details  Name: Tyler AskewDavid Lane MRN: [id number] Date of Birth: 08/26/1950  Today's Date: 07/06/2016 PT Individual Time: 0805-0900 PT Individual Time Calculation (min): 55 min   Short Term Goals: Week 1:  PT Short Term Goal 1 (Week 1): Pt to perform supine<>sitting with mod assist.  PT Short Term Goal 2 (Week 1): Pt to perform sit<>stand with max assist and LRAD PT Short Term Goal 3 (Week 1): Pt to ambulate 10 ft with assistance and LRAD.  PT Short Term Goal 4 (Week 1): Pt to sit X 8 min with supervision for balance and trunk control   Skilled Therapeutic Interventions/Progress Updates:  Pt received in bed & transferred supine<>sit with mod assist, HOB elevated, and bed rails. Pt requires max multimodal cuing to not use RUE but pt unable to maintain weight bearing precautions. Pt requires significant time to initiate movement and physical assist to upright trunk with sidelying>sit. Session focused on transfers & gait. Pt requires min assist for sit<>stand transfers with armrests & mod assist without armrests. Pt ambulated 10 ft x 3 trials with  Mod assist with decreased weight shifting R 2/2 pain. Pt requires significantly extra time to initiate all movements (sit<>stand and to advance LE during gait). Pt only oriented to self, with therapist orienting pt on location/situation throughout session. At end of session pt returned to bed; pt able to scoot buttocks towards center of bed by bridging with significantly extra time. Pt left in bed with all needs within reach & bed alarm set.   Addendum: Pt able to lift each LE to allow therapist to thread shorts, pt required total assist to pull shorts over hips.   Therapy Documentation Precautions:  Precautions Precautions: Fall Precaution Comments: R shoulder sling Other Brace/Splint: has cortrax feeding tube Restrictions Weight Bearing Restrictions: Yes RUE Weight Bearing: Non weight bearing RLE Weight  Bearing: Weight bearing as tolerated LLE Weight Bearing: Weight bearing as tolerated  Pain: Pt with behaviors noting pain in R pelvis/leg - rest breaks provided PRN.   See Function Navigator for Current Functional Status.   Therapy/Group: Individual Therapy  Sandi MariscalVictoria M Jordain Radin 07/06/2016, 11:46 AM

## 2016-07-06 NOTE — Progress Notes (Signed)
Physical Therapy Note  Patient Details  Name: Tyler AskewDavid Lane MRN: [id number] Date of Birth: 04-Feb-1951 Today's Date: 07/06/2016    Time: 1300-1359 59 minutes  1:1 Pt c/o LE pain with mobility, eases with rest.  Pt agreeable to participation with encouragement.  Supine to sit with mod A, increased time for initiation.  Sit to stand to don pants with mod A, increased time, multiple attempts as pt wants to sit quickly before pants are fully donned.  Gait 15', 7735' with Lt HHA,, +2 for safety, mod manual facilitation for wt shifts, posture and forward momentum as pt wants to freeze during gait.  Seated and standing horseshoe toss with continued increased time for initiation, cues for posture and min A for static standing balance.  Pt limited by decreased attention to task in mod distraciting environment of noisy gym with partition screen to decrease visual distractions.  Pt left seated at nurses station in tilt in space chair with quick release belt donned.   Tyler Lane 07/06/2016, 1:59 PM

## 2016-07-07 ENCOUNTER — Inpatient Hospital Stay (HOSPITAL_COMMUNITY): Payer: Medicare HMO

## 2016-07-07 ENCOUNTER — Inpatient Hospital Stay (HOSPITAL_COMMUNITY): Payer: Medicare HMO | Admitting: Occupational Therapy

## 2016-07-07 ENCOUNTER — Inpatient Hospital Stay (HOSPITAL_COMMUNITY): Payer: Medicare HMO | Admitting: Physical Therapy

## 2016-07-07 ENCOUNTER — Inpatient Hospital Stay (HOSPITAL_COMMUNITY): Payer: Medicare HMO | Admitting: Speech Pathology

## 2016-07-07 LAB — GLUCOSE, CAPILLARY
GLUCOSE-CAPILLARY: 107 mg/dL — AB (ref 65–99)
GLUCOSE-CAPILLARY: 121 mg/dL — AB (ref 65–99)
GLUCOSE-CAPILLARY: 98 mg/dL (ref 65–99)
Glucose-Capillary: 98 mg/dL (ref 65–99)
Glucose-Capillary: 121 mg/dL — ABNORMAL HIGH (ref 65–99)
Glucose-Capillary: 96 mg/dL (ref 65–99)

## 2016-07-07 LAB — CULTURE, BLOOD (ROUTINE X 2)
CULTURE: NO GROWTH
CULTURE: NO GROWTH
SPECIAL REQUESTS: ADEQUATE
SPECIAL REQUESTS: ADEQUATE

## 2016-07-07 NOTE — Plan of Care (Signed)
Problem: RH BLADDER ELIMINATION Goal: RH STG MANAGE BLADDER WITH ASSISTANCE Patient will be able to manage with minimal -assist  Outcome: Not Progressing Total assist-incontinent at this time

## 2016-07-07 NOTE — Progress Notes (Signed)
Occupational Therapy Session Note  Patient Details  Name: Tyler AskewDavid Lane MRN: [id number] Date of Birth: October 10, 1950  Today's Date: 07/07/2016 OT Individual Time: 1101-1202 OT Individual Time Calculation (min): 61 min    Short Term Goals: Week 1:  OT Short Term Goal 1 (Week 1): Pt will perform UB dressing with mod A to decrease level of assistance with self care.  OT Short Term Goal 2 (Week 1): Pt will sit EOB to wash UB with min A for dynamic sitting balance. OT Short Term Goal 3 (Week 1): Pt will stand with mod A stand balance for LB clothing management.  Skilled Therapeutic Interventions/Progress Updates:    Tx focus on initiation, endurance, and dynamic standing balance.   Pt greeted while supine in bed, agreeable to complete bathing. Pt able to select shirt/pants when provided with 2 clothing options (in under 10 seconds!). Supine<sit with Max A and additional helper to maintain R UE NWB. Pt bathed while seated EOB, required extra time for initiation/processing and cues for step by step sequencing. Sit<stand for pericare completion with Mod A (max cues for not using R UE). Slight buckling of L LE but pt able to maintain standing with left knee blocked and OT support from side. 2nd helper assisted with pericare/donning clean brief. Pt then donned shirt. Max tactile cues for attention. He required overall Min A. Pt able to lift pants over hips in standing with bilateral UEs! Stand step transfer completed with 2 helpers (R UE in sling for NWB). Manual facilitation required for flexing right knee in order to take a step. Pt engaged in oral care task with Max A at sink. Pt attempting to brush teeth with denture container water. At end of tx pt was reclined in TIS and safety belt applied. He was escorted to RN station, placed beside another patient. With therapy Ipad, OT set up mellow 70s playlist. Unit secretary near device. Pt smiling and bobbing head to music at time of departure.   (Pt unable to  verbalize music preference and therefore music selected by therapist)   Therapy Documentation Precautions:  Precautions Precautions: Fall Precaution Comments: R shoulder sling Other Brace/Splint: has cortrax feeding tube Restrictions Weight Bearing Restrictions: Yes RUE Weight Bearing: Non weight bearing RLE Weight Bearing: Weight bearing as tolerated LLE Weight Bearing: Weight bearing as tolerated   Pain: No c/o or expressions of pain during tx    ADL:      See Function Navigator for Current Functional Status.   Therapy/Group: Individual Therapy  Tyler Lane 07/07/2016, 12:47 PM

## 2016-07-07 NOTE — Progress Notes (Signed)
Occupational Therapy Session Note  Patient Details  Name: Tyler Lane MRN: [id number] Date of Birth: 01-Jun-1950  Today's Date: 07/07/2016 OT Individual Time: 1300-1330 OT Individual Time Calculation (min): 30 min    Short Term Goals: Week 1:  OT Short Term Goal 1 (Week 1): Pt will perform UB dressing with mod A to decrease level of assistance with self care.  OT Short Term Goal 2 (Week 1): Pt will sit EOB to wash UB with min A for dynamic sitting balance. OT Short Term Goal 3 (Week 1): Pt will stand with mod A stand balance for LB clothing management.  Skilled Therapeutic Interventions/Progress Updates:    1;1.  Focus of session in cognition, initiation, and functional transfers. Pt stand pivot transfers W/c<>EOM<>EOB with MOD A for lifting and manual facilitation of weight shifting to pivot with +2 avaiable to hold RUE so pt does not bear weigth 2/2 to precautions. Pt sits EOM with supervision to sort money into piles. Pt able to sort correctly ~75% time. Pt requires significantly increased time to initiate movement. Pt able to pick up 10 pennies when asked to give +2 10 cents. Exited session with pt supine HOB >30*, call light in reach, bed exit alarm on and wife in room.   Therapy Documentation Precautions:  Precautions Precautions: Fall Precaution Comments: R shoulder sling Other Brace/Splint: has cortrax feeding tube Restrictions Weight Bearing Restrictions: Yes RUE Weight Bearing: Non weight bearing RLE Weight Bearing: Weight bearing as tolerated LLE Weight Bearing: Weight bearing as tolerated  See Function Navigator for Current Functional Status.   Therapy/Group: Individual Therapy  Shon HaleStephanie M Marimar Suber 07/07/2016, 4:07 PM

## 2016-07-07 NOTE — Progress Notes (Signed)
   PHYSICAL MEDICINE & REHABILITATION     PROGRESS NOTE    Subjective/Complaints:  No complaints. Feels well . Eager to continue to therapy.    Objective: Vital Signs: Blood pressure (!) 150/90, pulse 70, temperature 98.8 F (37.1 C), temperature source Oral, resp. rate 18, weight 159 lb 13.3 oz (72.5 kg), SpO2 96 %.   Physical Exam:    Assessment/Plan: 1. 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  DVT Prophylaxis/Anticoagulation: Mechanical: Sequential compression devices, below knee Bilateral lower extremities 3. Pain Management: he denies pain 4. Mood: [name] to follow for evaluation and support as mentation improves.  5. Neuropsych: This patient is not capable of making decisions on his own behalf.  -hold stimulant for now 6. Skin/Wound Care: routine skin care 7. Fluids/Electrolytes/Nutrition: Monitor I/O. Increase water flushes  8. Right displaced sacral and right pubic rami fracture: WBAT 9. Right clavicle fracture: NWB --to keep arm below 90 degrees and sling when out of bed. 10. Acute urinary retention: Monitor voiding with PVR checks.  toilet patient every 4 hours.   -ua neg, ucx neg also 11. MSSA RLL PNA:  . Rocephin complete    -afebrile currently. No clinical signs of active disease 12. Pre-renal azotemia: changed tube feed to Nephro. Increased water flushes to qid.   - BUN 33, holding steady---continue same h2o flushes--recheck monday 13. HTN: On metoprolol bid.  Fair control at present    LOS (Days) 5 A FACE TO FACE EVALUATION WAS PERFORMED  Lindley MagnusBruce H Swords, MD 07/07/2016 10:20 AM

## 2016-07-07 NOTE — Progress Notes (Signed)
Physical Therapy Session Note  Patient Details  Name: Tyler AskewDavid Kleppe MRN: [id number] Date of Birth: 01-Jul-1950  Today's Date: 07/07/2016 PT Individual Time: [id number] PT Individual Time Calculation (min): 55 min   Short Term Goals: Week 1:  PT Short Term Goal 1 (Week 1): Pt to perform supine<>sitting with mod assist.  PT Short Term Goal 2 (Week 1): Pt to perform sit<>stand with max assist and LRAD PT Short Term Goal 3 (Week 1): Pt to ambulate 10 ft with assistance and LRAD.  PT Short Term Goal 4 (Week 1): Pt to sit X 8 min with supervision for balance and trunk control   Skilled Therapeutic Interventions/Progress Updates: Pt received semi-reclined in bed, denies pain and agreeable to treatment. Pt able to verbalize name and DOB with increased time. Supine>sit with modA d/t decreased initiation moving LEs and pt occasionally vocalizing pain with LE movement however cannot localize. Stand pivot transfer to R side to tilt in space w/c with maxA for initiation, facilitating weight shift and cueing for attention to task. Stand pivot transfer w/c <>mat table with modA and improved initiation. S sitting balance on edge of mat while engaged in pipe tree activity with max multimodal cueing. Gait x20' with LUE over therapist's shoulder, +2 w/c follow for safety, modA for facilitating weight shift and cues for attention to task. Pt again vocalizes pain/discomfort during gait when weight bearing through RLE, unable to describe further. Sit <>stand at sink with close S using LUE on armrest and sink; therapist changed brief and performed hygiene totalA with S for standing balance following small incontinent bowel movement. Ambulatory transfer x5' to bed with modA. MinA sit >supine. +2A to scoot toward HOB. Remained seated in bed HOB >30 degrees, alarm intact and all needs in reach at end of session.      Therapy Documentation Precautions:  Precautions Precautions: Fall Precaution Comments: R shoulder  sling Other Brace/Splint: has cortrax feeding tube Restrictions Weight Bearing Restrictions: Yes RUE Weight Bearing: Non weight bearing RLE Weight Bearing: Weight bearing as tolerated LLE Weight Bearing: Weight bearing as tolerated  Pain: Pain Assessment Pain Assessment: No/denies pain   See Function Navigator for Current Functional Status.   Therapy/Group: Individual Therapy  Vista Lawmanlizabeth J Tygielski 07/07/2016, 9:45 AM

## 2016-07-07 NOTE — Progress Notes (Signed)
Speech Language Pathology Daily Session Note  Patient Details  Name: Tyler Lane MRN: [id number] Date of Birth: 1951-01-16  Today's Date: 07/07/2016 SLP Individual Time: 6073-7106 SLP Individual Time Calculation (min): 40 min  Short Term Goals: Week 1: SLP Short Term Goal 1 (Week 1): Patient will follow commands in 50% of opportunities with Max A verbal cues.  SLP Short Term Goal 2 (Week 1): Patient will orient to place and situation with Max A verbal and visual cues.  SLP Short Term Goal 3 (Week 1): Patient will demonstrate sustained attention to a functional task for ~2 minutes with Max A multimodal cues.  SLP Short Term Goal 4 (Week 1): Patient will request assistance/make needs known during functional tasks with Max A verbal cues.  SLP Short Term Goal 5 (Week 1): Patient will consume trials of ice chips with Mod assist multimodal cues for initiation of swallows in less thean 30 seconds with minimal overt s/s of aspiration. SLP Short Term Goal 5 - Progress (Week 1): Met SLP Short Term Goal 6 (Week 1): Patient will consume trials of Dys. 1 textures with thin liquids without overt s/s of aspiration and Mod A verbal cues for swallow initiation in less than 20% of opportunities over 2 sessions prior to upgrade.   Skilled Therapeutic Interventions: Skilled treatment session focused on addressing dysphagia and cognition goals. SLP facilitated session by providing Max assist multimodal cues for initial arousal and sustained attention to basic self-care tasks for ~1 minute increments.  Patient was offered coffee, but declined it and requested ice cream.  SLP then used trials of ice cream as a motivator to maintain arousal and sustained attetion to self-feeing task, which patient did for 2-3 minute increments with Max assist multimodal cues for small portions and a slow pace of self-feeding.  SLP attempted to fade cues; however, large boluses resulted in cough which was likely due to inability to  attention.  Patient able to orient to place and situation from a choice of 3 along with Max assist verbal and visual cues.  Continue with current plan of care.    Function:  Eating Eating   Modified Consistency Diet: Yes (trials with SLp) Eating Assist Level: Set up assist for;Supervision or verbal cues;Help with picking up utensils;Helper scoops food on utensil   Eating Set Up Assist For: Opening containers Helper [place] on Utensil: Occasionally     Cognition Comprehension Comprehension assist level: Understands basic less than 25% of the time/ requires cueing >75% of the time  Expression   Expression assist level: Expresses basic 25 - 49% of the time/requires cueing 50 - 75% of the time. Uses single words/gestures.  Social Interaction Social Interaction assist level: Interacts appropriately 25 - 49% of time - Needs frequent redirection.  Problem Solving Problem solving assist level: Solves basic 25 - 49% of the time - needs direction more than half the time to initiate, plan or complete simple activities  Memory Memory assist level: Recognizes or recalls less than 25% of the time/requires cueing greater than 75% of the time    Pain Pain Assessment Pain Assessment: No/denies pain  Therapy/Group: Individual Therapy  Carmelia Roller., CCC-SLP [phone]  [place] 07/07/2016, 8:48 AM

## 2016-07-08 LAB — GLUCOSE, CAPILLARY
GLUCOSE-CAPILLARY: 102 mg/dL — AB (ref 65–99)
GLUCOSE-CAPILLARY: 104 mg/dL — AB (ref 65–99)
GLUCOSE-CAPILLARY: 114 mg/dL — AB (ref 65–99)
Glucose-Capillary: 100 mg/dL — ABNORMAL HIGH (ref 65–99)
Glucose-Capillary: 103 mg/dL — ABNORMAL HIGH (ref 65–99)
Glucose-Capillary: 98 mg/dL (ref 65–99)

## 2016-07-08 MED ORDER — TRIAMCINOLONE ACETONIDE 0.1 % EX OINT
TOPICAL_OINTMENT | Freq: Three times a day (TID) | CUTANEOUS | Status: DC | PRN
  Administered 2016-07-08: 16:00:00 via TOPICAL
  Filled 2016-07-08: qty 15

## 2016-07-08 MED ORDER — HYDROCERIN EX CREA
TOPICAL_CREAM | CUTANEOUS | Status: DC | PRN
  Administered 2016-07-08 – 2016-07-30 (×3): via TOPICAL
  Filled 2016-07-08: qty 113

## 2016-07-08 NOTE — Progress Notes (Signed)
  Tyler Lane PHYSICAL MEDICINE & REHABILITATION     PROGRESS NOTE    Subjective/Complaints: No complaints- feels well. Discouraged with slow progress.   Objective: Vital Signs: Blood pressure (!) 169/91, pulse 71, temperature 98.3 F (36.8 C), temperature source Oral, resp. rate 18, weight 155 lb 3.3 oz (70.4 kg), SpO2 95 %.   Physical Exam:  Chronically ill appearing male in no acute distress. HEENT exam atraumatic (feeding tube in nose), normocephalic, neck supple without jugular venous distention. Chest clear to auscultation cardiac exam S1-S2 are regular. Abdominal exam thin with bowel sounds, soft and nontender. Extremities no edema. Neurologic exam is alert    Assessment/Plan: 1. 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  2.  DVT Prophylaxis/Anticoagulation: Mechanical: Sequential compression devices, below knee Bilateral lower extremities 3. Pain Management: he denies pain 4. Mood: [name] to follow for evaluation and support as mentation improves.  5. Neuropsych: This patient is not capable of making decisions on his own behalf.  -hold stimulant for now 6. Skin/Wound Care: routine skin care 7. Fluids/Electrolytes/Nutrition: Monitor I/O. Increase water flushes  8. Right displaced sacral and right pubic rami fracture: WBAT 9. Right clavicle fracture: NWB --to keep arm below 90 degrees and sling when out of bed. 10. Acute urinary retention: Monitor voiding with PVR checks.  toilet patient every 4 hours.   -ua neg, ucx neg also 11. MSSA RLL PNA:  . Rocephin complete    -afebrile currently. No clinical signs of active disease 12. Pre-renal azotemia: changed tube feed to Nephro. Increased water flushes to qid.   - BUN 33, holding steady---continue same h2o flushes--recheck monday 13. HTN: On metoprolol bid.   142/82-169/91. Will follow, may need to adjust meds   LOS (Days) 6 A FACE TO FACE EVALUATION WAS PERFORMED  Tyler MagnusBruce H Ashlynn Gunnels, MD 07/08/2016 8:55 AM

## 2016-07-09 ENCOUNTER — Inpatient Hospital Stay (HOSPITAL_COMMUNITY): Payer: Medicare HMO | Admitting: Occupational Therapy

## 2016-07-09 ENCOUNTER — Inpatient Hospital Stay (HOSPITAL_COMMUNITY): Payer: Medicare HMO | Admitting: Physical Therapy

## 2016-07-09 ENCOUNTER — Inpatient Hospital Stay (HOSPITAL_COMMUNITY): Payer: Medicare HMO | Admitting: Speech Pathology

## 2016-07-09 LAB — BASIC METABOLIC PANEL
Anion gap: 9 (ref 5–15)
BUN: 29 mg/dL — AB (ref 6–20)
CO2: 28 mmol/L (ref 22–32)
CREATININE: 0.85 mg/dL (ref 0.61–1.24)
Calcium: 9.2 mg/dL (ref 8.9–10.3)
Chloride: 104 mmol/L (ref 101–111)
GFR calc Af Amer: >60 mL/min (ref >60)
GFR calc non Af Amer: >60 mL/min (ref >60)
GLUCOSE: 103 mg/dL — AB (ref 65–99)
Potassium: 4.1 mmol/L (ref 3.5–5.1)
SODIUM: 141 mmol/L (ref 135–145)

## 2016-07-09 LAB — GLUCOSE, CAPILLARY
GLUCOSE-CAPILLARY: 103 mg/dL — AB (ref 65–99)
GLUCOSE-CAPILLARY: 105 mg/dL — AB (ref 65–99)
GLUCOSE-CAPILLARY: 111 mg/dL — AB (ref 65–99)
GLUCOSE-CAPILLARY: 120 mg/dL — AB (ref 65–99)
Glucose-Capillary: 109 mg/dL — ABNORMAL HIGH (ref 65–99)
Glucose-Capillary: 110 mg/dL — ABNORMAL HIGH (ref 65–99)

## 2016-07-09 MED ORDER — METHYLPHENIDATE HCL 5 MG PO TABS
5.0000 mg | ORAL_TABLET | Freq: Two times a day (BID) | ORAL | Status: DC
  Administered 2016-07-09 – 2016-07-11 (×5): 5 mg
  Filled 2016-07-09 (×5): qty 1

## 2016-07-09 NOTE — Progress Notes (Signed)
[place] PHYSICAL MEDICINE & REHABILITATION     PROGRESS NOTE    Subjective/Complaints:  In bed with therapy. More drowsy today. Didn't sleep as well.   ROS: Limited due cognitive/behavioral   Objective: Vital Signs: Blood pressure (!) 162/89, pulse 66, temperature 98.2 F (36.8 C), temperature source Oral, resp. rate 18, weight 70.4 kg (155 lb 3.3 oz), SpO2 99 %. No results found. No results for input(s): WBC, HGB, HCT, PLT in the last 72 hours.  Recent Labs  07/09/16 0604  NA 141  K 4.1  CL 104  GLUCOSE 103*  BUN 29*  CREATININE 0.85  CALCIUM 9.2   CBG (last 3)   Recent Labs  07/09/16 0024 07/09/16 0452 07/09/16 0818  GLUCAP 120* 109* 103*    Wt Readings from Last 3 Encounters:  07/09/16 70.4 kg (155 lb 3.3 oz)  07/02/16 74.3 kg (163 lb 12.8 oz)    Physical Exam:  Constitutional: He appears well-developed and well-nourished. No distress    HENT:  Head: Normocephalic.  Right Ear: External ear normal.  Left Ear: External ear normal.  Mouth/Throat: Oropharynx is clear and moist.  Eyes: Conjunctivae and EOM are normal. Pupils are equal, round, and reactive to light. Right eye exhibits no discharge. Left eye exhibits no discharge.  Neck: Normal range of motion. Neck supple.  Cardiovascular: RRR Respiratory:  CTA B without distress GI: Soft. Bowel sounds are normal. He exhibits no distension. There is no tenderness.  Musculoskeletal: right shoulder tender, in sling Neurological: He is more drowsy.  Oriented to person. Language of confusion. Follows all commands. Limited insight and awareness.  Motor  : moves all 4.  A&Ox1 only HOH  Skin: Skin is warm and dry.  Psychiatric: cooperative   Assessment/Plan: 1. 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 Physiatrist is providing close team supervision and 24 hour management of active medical  problems listed below. Physiatrist and rehab team continue to assess barriers to discharge/monitor patient progress toward functional and medical goals.  Function:  Bathing Bathing position Bathing activity did not occur: Refused Position: Bed  Bathing parts Body parts bathed by patient: Chest, Abdomen, Front perineal area, Right upper leg, Left upper leg Body parts bathed by helper: Right arm, Chest, Left lower leg, Back, Abdomen, Left arm, Left upper leg, Right lower leg, Right upper leg, Buttocks, Front perineal area  Bathing assist Assist Level: 2 helpers      Upper Body Dressing/Undressing Upper body dressing   What is the patient wearing?: Pull over shirt/dress     Pull over shirt/dress - Perfomed by patient: Thread/unthread right sleeve, Thread/unthread left sleeve, Put head through opening Pull over shirt/dress - Perfomed by helper: Pull shirt over trunk        Upper body assist Assist Level: Touching or steadying assistance(Pt > 75%)      Lower Body Dressing/Undressing Lower body dressing   What is the patient wearing?: Non-skid slipper socks, Pants     Pants- Performed by patient: Pull pants up/down Pants- Performed by helper: Thread/unthread right pants leg, Thread/unthread left pants leg, Pull pants up/down   Non-skid slipper socks- Performed by helper: Don/doff right sock, Don/doff left sock                  Lower body assist Assist for lower body dressing: 2 Helpers      Toileting Toileting Toileting activity did not occur: No continent  bowel/bladder event   Toileting steps completed by helper: Adjust clothing prior to toileting, Performs perineal hygiene, Adjust clothing after toileting    Toileting assist Assist level: Two helpers   Transfers Chair/bed transfer Chair/bed transfer activity did not occur: Safety/medical concerns Chair/bed transfer method: Stand pivot Chair/bed transfer assist level: Moderate assist (Pt 50 - 74%/lift or  lower) Chair/bed transfer assistive device: Armrests     Locomotion Ambulation Ambulation activity did not occur: Safety/medical concerns   Max distance: 20 Assist level: 2 helpers (w/c follow, modA overall)   Wheelchair Wheelchair activity did not occur: Safety/medical concerns   Max wheelchair distance: 20' Assist Level: Maximal assistance (Pt 25 - 49%) (BLE)  Cognition Comprehension Comprehension assist level: Understands basic less than 25% of the time/ requires cueing >75% of the time  Expression Expression assist level: Expresses basic 25 - 49% of the time/requires cueing 50 - 75% of the time. Uses single words/gestures.  Social Interaction Social Interaction assist level: Interacts appropriately 25 - 49% of time - Needs frequent redirection.  Problem Solving Problem solving assist level: Solves basic 25 - 49% of the time - needs direction more than half the time to initiate, plan or complete simple activities  Memory Memory assist level: Recognizes or recalls 25 - 49% of the time/requires cueing 50 - 75% of the time   Medical Problem List and Plan: 1.  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  DVT Prophylaxis/Anticoagulation: Mechanical: Sequential compression devices, below knee Bilateral lower extremities 3. Pain Management: Hydrocodone prn. Monitor for sedation.  4. Mood: [name] to follow for evaluation and support as mentation improves.  5. Neuropsych: This patient is not capable of making decisions on his own behalf.  - will add low dose ritalin 6. Skin/Wound Care: routine skin care 7. Fluids/Electrolytes/Nutrition: Monitor I/O. Increase water flushes  8. Right displaced sacral and right pubic rami fracture: WBAT 9. Right clavicle fracture: NWB --to keep arm below 90 degrees and sling when out of bed. 10. Acute urinary retention: Monitor voiding with PVR checks.  toilet  patient every 4 hours.   -ua neg, ucx neg also 11. MSSA RLL PNA:  . Rocephin complete    -afebrile currently. No clinical signs of active disease 12. Pre-renal azotemia: changed tube feed to Nephro. Increased water flushes to qid.   - BUN decreased to 29 today---continue same h2o flushes--recheck later in week 13. HTN: On metoprolol bid.  Fair control at present    LOS (Days) 7 A FACE TO FACE EVALUATION WAS PERFORMED  Ranelle OysterSWARTZ,Labella Zahradnik T, MD 07/09/2016 10:06 AM

## 2016-07-09 NOTE — Progress Notes (Signed)
Physical Therapy Note  Patient Details  Name: Tyler AskewDavid Whittley MRN: [id number] Date of Birth: March 23, 1950 Today's Date: 07/09/2016    Time: [phone] 65 minutes  1:1 Pt with wincing with wt bearing on LEs, but resolves with rest.  Session focused on activity tolerance, initiation and attention to task.  Pt performed stand pivot transfers and sit to stand transfers multiple times in the session with mod/max A, cues for sequencing with pivoting to sit. Standing horseshoe toss game with pt able to stand to throw 5 horseshoes x 2 attempts with mod A, 1 round seated tossing 5 horseshoes.  Gait with +2 assist for safety 25' x 2 with manual facilitation for wt shifts and balance with turning to sit.  Seated pipe tree activity initially pt able to perform with mod cuing.  With increasing fatigue pt requires max/total cuing to complete task.  Pt with increased restlessness this session, frequently laying back when sitting edge of mat, requiring max A for supine to sit.  Pt left in TIS w/c at nurses station with quick release belt donned   Jaison Petraglia 07/09/2016, 10:31 AM

## 2016-07-09 NOTE — Progress Notes (Signed)
Speech Language Pathology Daily Session Note  Patient Details  Name: Tyler Lane MRN: [id number] Date of Birth: 1951-01-23  Today's Date: 07/09/2016 SLP Individual Time: 1115-1200 SLP Individual Time Calculation (min): 45 min  Short Term Goals: Week 1: SLP Short Term Goal 1 (Week 1): Patient will follow commands in 50% of opportunities with Max A verbal cues.  SLP Short Term Goal 2 (Week 1): Patient will orient to place and situation with Max A verbal and visual cues.  SLP Short Term Goal 3 (Week 1): Patient will demonstrate sustained attention to a functional task for ~2 minutes with Max A multimodal cues.  SLP Short Term Goal 4 (Week 1): Patient will request assistance/make needs known during functional tasks with Max A verbal cues.  SLP Short Term Goal 5 (Week 1): Patient will consume trials of ice chips with Mod assist multimodal cues for initiation of swallows in less thean 30 seconds with minimal overt s/s of aspiration. SLP Short Term Goal 5 - Progress (Week 1): Met SLP Short Term Goal 6 (Week 1): Patient will consume trials of Dys. 1 textures with thin liquids without overt s/s of aspiration and Mod A verbal cues for swallow initiation in less than 20% of opportunities over 2 sessions prior to upgrade.   Skilled Therapeutic Interventions: Skilled treatment session focused on dysphagia and cognition goals. SLP facilitated session by providing skilled observation of pt consuming dysphagia 1 lunch tray with thin liquids via cup. Pt with the appearance of timely swallow initiation with thin liquids. Pt with lengthy oral prep time > 10 seconds of chewing per bolus of puree. Despite Total A cues, pt unable to decrease. Pt made several independent attempts to use liquids to aid in AP transfer however each time (3 times) it resulting in coughing. Tray discontinued as fatigue made oral prep time longer than 10 seconds after 25% of meal. Pt required Max A verbal cues to follow 1 step directions  with ~ 25% accuracy. Given 2 verbal choices, pt able to state correct place but not situation. Pt was left upright in his wheelchair with safety belt donned at nursing station for increased supervision. Continue per current plan of care.      Function:  Eating Eating   Modified Consistency Diet: Yes (trial with SLP) Eating Assist Level: Helper feeds patient     Helper Scoops Food on Utensil: Every scoop     Cognition Comprehension Comprehension assist level: Understands basic 25 - 49% of the time/ requires cueing 50 - 75% of the time  Expression   Expression assist level: Expresses basic 25 - 49% of the time/requires cueing 50 - 75% of the time. Uses single words/gestures.  Social Interaction Social Interaction assist level: Interacts appropriately 25 - 49% of time - Needs frequent redirection.  Problem Solving Problem solving assist level: Solves basic 25 - 49% of the time - needs direction more than half the time to initiate, plan or complete simple activities  Memory Memory assist level: Recognizes or recalls 25 - 49% of the time/requires cueing 50 - 75% of the time    Pain    Therapy/Group: Individual Therapy  Iris Tatsch 07/09/2016, 11:52 AM

## 2016-07-09 NOTE — Progress Notes (Signed)
Physical Therapy Weekly Progress Note  Patient Details  Name: Tyler Lane MRN: [id number] Date of Birth: Mar 06, 1950  Beginning of progress report period: Jul 03, 2016 End of progress report period: Jul 10, 2016  Today's Date: 07/10/2016   Patient has met 4 of 4 short term goals.  Pt is making fair progress towards LTG's but continues to be limited by impaired cognition and pain in LE with weight bearing. Pt is able to ambulate a max of 25 ft with mod assist<>+2 assistance. Pt would benefit from continued skilled PT treatment to progress gait, balance, endurance, strength training and for cognitive remediation. Pt will also benefit from continued therapy to initiate stair training to prepare for home access upon d/c.   Patient continues to demonstrate the following deficits muscle weakness, decreased cardiorespiratory endurance, decreased coordination, decreased initiation, decreased attention, decreased awareness, decreased problem solving, decreased safety awareness, decreased memory and delayed processing, and decreased standing balance, decreased postural control and decreased balance strategies and therefore will continue to benefit from skilled PT intervention to increase functional independence with mobility.  Patient progressing toward long term goals..  Continue plan of care.  PT Short Term Goals Week 1:  PT Short Term Goal 1 (Week 1): Pt to perform supine<>sitting with mod assist.  PT Short Term Goal 1 - Progress (Week 1): Met PT Short Term Goal 2 (Week 1): Pt to perform sit<>stand with max assist and LRAD PT Short Term Goal 2 - Progress (Week 1): Met PT Short Term Goal 3 (Week 1): Pt to ambulate 10 ft with assistance and LRAD.  PT Short Term Goal 3 - Progress (Week 1): Met PT Short Term Goal 4 (Week 1): Pt to sit X 8 min with supervision for balance and trunk control. PT Short Term Goal 4 - Progress (Week 1): Met Week 2:  PT Short Term Goal 1 (Week 2): Pt will ambulate  consistently 50 ft with +1 assist. PT Short Term Goal 2 (Week 2): Pt will initate stair training to prepare for home access.    Therapy Documentation Precautions:  Precautions Precautions: Fall Precaution Comments: R shoulder sling Other Brace/Splint: has cortrax feeding tube Restrictions Weight Bearing Restrictions: Yes RUE Weight Bearing: Non weight bearing RLE Weight Bearing: Weight bearing as tolerated LLE Weight Bearing: Weight bearing as tolerated    See Function Navigator for Current Functional Status.  Therapy/Group: Individual Therapy  [name] 07/10/2016, 7:53 AM

## 2016-07-09 NOTE — Progress Notes (Signed)
Occupational Therapy Session Note  Patient Details  Name: Tyler Lane MRN: [id number] Date of Birth: [birth year]  Today's Date: 07/09/2016  Session 1 OT Individual Time: 0802-0900 OT Individual Time Calculation (min): 58 min   Session 2 OT Individual Time: 1884-1660 OT Individual Time Calculation (min): 32 min    Short Term Goals: Week 1:  OT Short Term Goal 1 (Week 1): Pt will perform UB dressing with mod A to decrease level of assistance with self care.  OT Short Term Goal 2 (Week 1): Pt will sit EOB to wash UB with min A for dynamic sitting balance. OT Short Term Goal 3 (Week 1): Pt will stand with mod A stand balance for LB clothing management.  Skilled Therapeutic Interventions/Progress Updates:  Session 1   Pt greeted in bed with only brief on, itching a hole in the front of brief. Pt incontinent of bowel and bladder. Given wash-cloth and pt able to wash peri-area, then Max A to roll R and difficulty maintaining sidelying 2/2 pain and poor attention. Total A to wash buttocks and change brief. Pt more lethargic today, but easy to waken. Pt given wash cloth and able to follow instructional cues to wash b UEs, chest, and B upper legs. Pt required rest break, then was given a sock and instructed to put it on. Pt able to maintain attention to task and sequence donning L sock in bed in quiet environment. Pt had more dificulty problem solving how to don R sock, requiring multimodal cues to attend, initiate and sequence donning R sock. Pt transferred to EOB with Max A, then able to maintain sitting balance with min A progressing to supervision. Pt given shirt oriented by OT and completed with overall Min A and Mod instructional cues to sequence and initiate. OT assisted with donning pants for time management. Sling donned on R UE to assist with NWB with standing and transfer.  Stedy used for sit<>stand 2/2 no +2 available with Max A and increased time to initiate. Pt then transferred to TIS wc  via stedy, safety belt placed, and pt left seated in wc at nurses station.   Session 2 OT treatment session focused on attention, initiation, transfers, and toileting. Pt brought to [facility] gym for dynavision activity. Pt required max instructional cues to initiate hitting 3 light buttons in 2 minutes. Pt returned to room and completed stand-pivot transfer with Mod A to stand, and facilitation for weight shifting and pivot. Once in bed,  positioned LE's for hip bridge to doff pants. Nurse tech bladder scanned pt and revealed pt w/ full bladder. Placed urinal, then pt able to void successfully. Hip bridge with LE positioning to  [facility] new brief. Pt then initiated pulling pants back up and was able to hip bridge and pull up pant simultaneously w/ min A. Pt left in care of nurse tech with needs met.   Therapy Documentation Precautions:  Precautions Precautions: Fall Precaution Comments: R shoulder sling Other Brace/Splint: has cortrax feeding tube Restrictions Weight Bearing Restrictions: Yes RUE Weight Bearing: Non weight bearing RLE Weight Bearing: Weight bearing as tolerated LLE Weight Bearing: Weight bearing as tolerated Pain: Pain Assessment Pain Assessment: Faces Faces Pain Scale: Hurts little more Pain Type: Acute pain Pain Location: Shoulder Pain Orientation: Left Pain Descriptors / Indicators: Sore Pain Onset: With Activity Pain Intervention(s): Repositioned \ See Function Navigator for Current Functional Status.   Therapy/Group: Individual Therapy  Valma Cava 07/09/2016, 2:36 PM

## 2016-07-09 NOTE — Plan of Care (Signed)
Problem: RH Stairs Goal: LTG Patient will ambulate up and down stairs w/assist (PT) LTG: Patient will ambulate up and down # of stairs with assistance (PT) 1 step + 1 step for home access

## 2016-07-10 ENCOUNTER — Inpatient Hospital Stay (HOSPITAL_COMMUNITY): Payer: Medicare HMO | Admitting: Occupational Therapy

## 2016-07-10 ENCOUNTER — Inpatient Hospital Stay (HOSPITAL_COMMUNITY): Payer: Medicare HMO | Admitting: Speech Pathology

## 2016-07-10 ENCOUNTER — Inpatient Hospital Stay (HOSPITAL_COMMUNITY): Payer: Medicare HMO | Admitting: Physical Therapy

## 2016-07-10 LAB — GLUCOSE, CAPILLARY
GLUCOSE-CAPILLARY: 106 mg/dL — AB (ref 65–99)
GLUCOSE-CAPILLARY: 113 mg/dL — AB (ref 65–99)
GLUCOSE-CAPILLARY: 120 mg/dL — AB (ref 65–99)
GLUCOSE-CAPILLARY: 114 mg/dL — AB (ref 65–99)
Glucose-Capillary: 103 mg/dL — ABNORMAL HIGH (ref 65–99)
Glucose-Capillary: 95 mg/dL (ref 65–99)

## 2016-07-10 MED ORDER — HYDROCODONE-ACETAMINOPHEN 7.5-325 MG/15ML PO SOLN
10.0000 mL | Freq: Two times a day (BID) | ORAL | Status: DC
  Administered 2016-07-10 – 2016-07-31 (×42): 10 mL via ORAL
  Filled 2016-07-10 (×43): qty 15

## 2016-07-10 MED ORDER — ACETAMINOPHEN 160 MG/5ML PO SOLN
325.0000 mg | Freq: Four times a day (QID) | ORAL | Status: DC | PRN
  Administered 2016-07-24: 650 mg
  Filled 2016-07-10 (×4): qty 20.3

## 2016-07-10 MED ORDER — HYDROCODONE-ACETAMINOPHEN 7.5-325 MG/15ML PO SOLN: 10.0000 mL | Freq: Two times a day (BID) | ORAL | Status: DC

## 2016-07-10 NOTE — Progress Notes (Signed)
[facility]YSICAL MEDICINE & REHABILITATION     PROGRESS NOTE    Subjective/Complaints: More alert. No new issues.   ROS: Limited due cognitive/behavioral    Objective: Vital Signs: Blood pressure (!) 144/78, pulse 73, temperature 98.3 F (36.8 C), temperature source Oral, resp. rate 18, weight 72.9 kg (160 lb 12.8 oz), SpO2 96 %. No results found. No results for input(s): WBC, HGB, HCT, PLT in the last 72 hours.  Recent Labs  07/09/16 0604  NA 141  K 4.1  CL 104  GLUCOSE 103*  BUN 29*  CREATININE 0.85  CALCIUM 9.2   CBG (last 3)   Recent Labs  07/10/16 0119 07/10/16 0425 07/10/16 0843  GLUCAP 103* 106* 113*    Wt Readings from Last 3 Encounters:  07/10/16 72.9 kg (160 lb 12.8 oz)  07/02/16 74.3 kg (163 lb 12.8 oz)    Physical Exam:  Constitutional: He appears well-developed and well-nourished. No distress    HENT:  Head: Normocephalic.  Right Ear: External ear normal.  Left Ear: External ear normal.  Mouth/Throat: Oropharynx is clear and moist.  Eyes: Conjunctivae and EOM are normal. Pupils are equal, round, and reactive to light. Right eye exhibits no discharge. Left eye exhibits no discharge.  Neck: Normal range of motion. Neck supple.  Cardiovascular: RRR Respiratory:  CTA B GI: Soft. Bowel sounds are normal. He exhibits no distension. There is no tenderness.  Musculoskeletal: right shoulder tender, in sling Neurological: He is more drowsy.  Oriented to person. Language of confusion. Follows all commands. Limited insight and awareness.  Motor  : moves all 4.  A&Ox1 only HOH  Skin: Skin is warm and dry.  Psychiatric: cooperative   Assessment/Plan: 1. 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 Physiatrist is providing close team supervision and 24 hour management of active medical problems listed below. Physiatrist and rehab team  continue to assess barriers to discharge/monitor patient progress toward functional and medical goals.  Function:  Bathing Bathing position Bathing activity did not occur: Refused Position: Bed  Bathing parts Body parts bathed by patient: Chest, Abdomen, Left arm, Right arm, Left upper leg, Front perineal area Body parts bathed by helper: Buttocks, Right upper leg, Right lower leg, Left lower leg, Back  Bathing assist Assist Level: Touching or steadying assistance(Pt > 75%)      Upper Body Dressing/Undressing Upper body dressing   What is the patient wearing?: Pull over shirt/dress     Pull over shirt/dress - Perfomed by patient: Thread/unthread right sleeve, Thread/unthread left sleeve, Put head through opening Pull over shirt/dress - Perfomed by helper: Pull shirt over trunk        Upper body assist Assist Level: Touching or steadying assistance(Pt > 75%)      Lower Body Dressing/Undressing Lower body dressing   What is the patient wearing?: Non-skid slipper socks, Pants     Pants- Performed by patient: Pull pants up/down Pants- Performed by helper: Thread/unthread right pants leg, Thread/unthread left pants leg Non-skid slipper socks- Performed by patient: Don/doff left sock Non-skid slipper socks- Performed by helper: Don/doff right sock                  Lower body assist Assist for lower body dressing: Touching or steadying assistance (Pt > 75%)      Toileting Toileting Toileting activity did not occur: No continent bowel/bladder event   Toileting steps completed by helper:  Adjust clothing prior to toileting, Performs perineal hygiene, Adjust clothing after toileting    Toileting assist Assist level: Two helpers   Transfers Chair/bed transfer Chair/bed transfer activity did not occur: Safety/medical concerns Chair/bed transfer method: Stand pivot, Ambulatory Chair/bed transfer assist level: Moderate assist (Pt 50 - 74%/lift or lower) Chair/bed transfer  assistive device: Armrests     Locomotion Ambulation Ambulation activity did not occur: Safety/medical concerns   Max distance: 20 ft Assist level: Moderate assist (Pt 50 - 74%)   Wheelchair Wheelchair activity did not occur: Safety/medical concerns   Max wheelchair distance: 20' Assist Level: Maximal assistance (Pt 25 - 49%) (BLE)  Cognition Comprehension Comprehension assist level: Understands basic 25 - 49% of the time/ requires cueing 50 - 75% of the time  Expression Expression assist level: Expresses basic 25 - 49% of the time/requires cueing 50 - 75% of the time. Uses single words/gestures.  Social Interaction Social Interaction assist level: Interacts appropriately 25 - 49% of time - Needs frequent redirection.  Problem Solving Problem solving assist level: Solves basic 25 - 49% of the time - needs direction more than half the time to initiate, plan or complete simple activities  Memory Memory assist level: Recognizes or recalls 25 - 49% of the time/requires cueing 50 - 75% of the time   Medical Problem List and Plan: 1.  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  DVT Prophylaxis/Anticoagulation: Mechanical: Sequential compression devices, below knee Bilateral lower extremities 3. Pain Management: Hydrocodone prn. Monitor for sedation.  4. Mood: [name] to follow for evaluation and support as mentation improves.  5. Neuropsych: This patient is not capable of making decisions on his own behalf.  - added low dose ritalin for attention/arousal 6. Skin/Wound Care: routine skin care 7. Fluids/Electrolytes/Nutrition: Monitor I/O. Increase water flushes  8. Right displaced sacral and right pubic rami fracture: WBAT 9. Right clavicle fracture: NWB --to keep arm below 90 degrees and sling when out of bed. 10. Acute urinary retention: Monitor voiding with PVR  checks.  toilet patient every 4 hours.   -ua neg, ucx neg also 11. MSSA RLL PNA:  . Rocephin complete    -afebrile currently. No clinical signs of active disease 12. Pre-renal azotemia: changed tube feed to Nephro. Increased water flushes to qid.   - BUN decreased to 29 today---continue same h2o flushes--recheck Thursday 13. HTN: On metoprolol bid.  Fair control at present    LOS (Days) 8 A FACE TO FACE EVALUATION WAS PERFORMED  Faith Rogue T, MD 07/10/2016 9:30 AM

## 2016-07-10 NOTE — Progress Notes (Signed)
Speech Language Pathology Daily Session Note  Patient Details  Name: Tyler Lane MRN: [id number] Date of Birth: 05/31/1950  Today's Date: 07/10/2016 SLP Individual Time: 1400-1425 SLP Individual Time Calculation (min): 25 min  Short Term Goals: Week 1: SLP Short Term Goal 1 (Week 1): Patient will follow commands in 50% of opportunities with Max A verbal cues.  SLP Short Term Goal 2 (Week 1): Patient will orient to place and situation with Max A verbal and visual cues.  SLP Short Term Goal 3 (Week 1): Patient will demonstrate sustained attention to a functional task for ~2 minutes with Max A multimodal cues.  SLP Short Term Goal 4 (Week 1): Patient will request assistance/make needs known during functional tasks with Max A verbal cues.  SLP Short Term Goal 5 (Week 1): Patient will consume trials of ice chips with Mod assist multimodal cues for initiation of swallows in less thean 30 seconds with minimal overt s/s of aspiration. SLP Short Term Goal 5 - Progress (Week 1): Met SLP Short Term Goal 6 (Week 1): Patient will consume trials of Dys. 1 textures with thin liquids without overt s/s of aspiration and Mod A verbal cues for swallow initiation in less than 20% of opportunities over 2 sessions prior to upgrade.   Skilled Therapeutic Interventions: Skilled treatment session focused on cognitive goals. Upon arrival, patient was lethargic while supine in bed and required Max multimodal cues for alertness for more than 30 second increments. Patient required total A for initiation of basic and functional tasks (washing face, donning glasses, etc) and was essentially nonverbal this session, suspect due to fatigue. Patient's daughter present and educated in regards to patient's current cognitive function and goals of skilled SLP intervention. She verbalized understanding but will need reinforcement.       Function:  Cognition Comprehension Comprehension assist level: Understands basic 25 - 49% of  the time/ requires cueing 50 - 75% of the time  Expression   Expression assist level: Expresses basic 25 - 49% of the time/requires cueing 50 - 75% of the time. Uses single words/gestures.  Social Interaction Social Interaction assist level: Interacts appropriately 25 - 49% of time - Needs frequent redirection.  Problem Solving Problem solving assist level: Solves basic 25 - 49% of the time - needs direction more than half the time to initiate, plan or complete simple activities  Memory Memory assist level: Recognizes or recalls 25 - 49% of the time/requires cueing 50 - 75% of the time    Pain No/Denies Pain   Therapy/Group: Individual Therapy  ,  07/10/2016, 3:26 PM   

## 2016-07-10 NOTE — Progress Notes (Signed)
Speech Language Pathology Daily Session Note  Patient Details  Name: Tyler Lane MRN: [id number] Date of Birth: [birth year]  Today's Date: 07/10/2016 SLP Individual Time: 1115-1200 SLP Individual Time Calculation (min): 45 min  Short Term Goals: Week 1: SLP Short Term Goal 1 (Week 1): Patient will follow commands in 50% of opportunities with Max A verbal cues.  SLP Short Term Goal 2 (Week 1): Patient will orient to place and situation with Max A verbal and visual cues.  SLP Short Term Goal 3 (Week 1): Patient will demonstrate sustained attention to a functional task for ~2 minutes with Max A multimodal cues.  SLP Short Term Goal 4 (Week 1): Patient will request assistance/make needs known during functional tasks with Max A verbal cues.  SLP Short Term Goal 5 (Week 1): Patient will consume trials of ice chips with Mod assist multimodal cues for initiation of swallows in less thean 30 seconds with minimal overt s/s of aspiration. SLP Short Term Goal 5 - Progress (Week 1): Met SLP Short Term Goal 6 (Week 1): Patient will consume trials of Dys. 1 textures with thin liquids without overt s/s of aspiration and Mod A verbal cues for swallow initiation in less than 20% of opportunities over 2 sessions prior to upgrade.   Skilled Therapeutic Interventions: Skilled treatment session focused on dysphagia and cognitive goals. SLP facilitated session by providing Min A verbal cues for swallow initiation with trials of thin liquids via cup. Patient consumed trials without overt s/s of aspiration but declined trials of Dys. 1 textures. Patient appeared lethargic throughout the session and required Max-Total A for initiation and focused attention to a sorting task from a field of 3 for ~30-60 seconds. Patient also required Total A for orientation. Patient transferred back to bed via the [facility] with Max-Total A verbal cues for problem solving and sequencing. Patient left supine in bed with alarm on. Continue with  current plan of care.      Function:  Eating Eating   Modified Consistency Diet: No Eating Assist Level: Help managing cup/glass;Supervision or verbal cues (with trials from SLP )           Cognition Comprehension Comprehension assist level: Understands basic 25 - 49% of the time/ requires cueing 50 - 75% of the time  Expression   Expression assist level: Expresses basic 25 - 49% of the time/requires cueing 50 - 75% of the time. Uses single words/gestures.  Social Interaction Social Interaction assist level: Interacts appropriately 25 - 49% of time - Needs frequent redirection.  Problem Solving Problem solving assist level: Solves basic 25 - 49% of the time - needs direction more than half the time to initiate, plan or complete simple activities  Memory Memory assist level: Recognizes or recalls 25 - 49% of the time/requires cueing 50 - 75% of the time    Pain No/Denies Pain   Therapy/Group: Individual Therapy  Tniyah Nakagawa 07/10/2016, 12:39 PM

## 2016-07-10 NOTE — Progress Notes (Signed)
Occupational Therapy Session Note  Patient Details  Name: Tyler AskewDavid Lane MRN: [id number] Date of Birth: 1951-01-10  Today's Date: 07/10/2016 OT Individual Time: 1000-1100 OT Individual Time Calculation (min): 60 min    Short Term Goals: Week 1:  OT Short Term Goal 1 (Week 1): Pt will perform UB dressing with mod A to decrease level of assistance with self care.  OT Short Term Goal 2 (Week 1): Pt will sit EOB to wash UB with min A for dynamic sitting balance. OT Short Term Goal 3 (Week 1): Pt will stand with mod A stand balance for LB clothing management.  Skilled Therapeutic Interventions/Progress Updates:   Pt received at RN station with no c/o, signs, or symptoms of pain this session. Pt able to state his name and required max cues with choice of 2 for orientation to location and time. Pt not oriented to situation. OT propelled pt to dayroom via wheelchair. Pt able to correctly state color of each peg and able to place peg into peg board upon command with 2 minutes for initiation. When asked to complete simple pattern of two colors he is unable. Pt given bean bags for tossing game. Pt given 1 step simple command and required 7 minutes to initiate tossing bag with max multimodal cues. Pt able to toss 5 bags within 20 minutes. Pt returned to RN station at end of session with quick release belt donned for safety.  Therapy Documentation Precautions:  Precautions Precautions: Fall Precaution Comments: R shoulder sling Other Brace/Splint: has cortrax feeding tube Restrictions Weight Bearing Restrictions: Yes RUE Weight Bearing: Non weight bearing RLE Weight Bearing: Weight bearing as tolerated LLE Weight Bearing: Weight bearing as tolerated  See Function Navigator for Current Functional Status.   Therapy/Group: Individual Therapy  [name], Gian Ybarra P 07/10/2016, 12:41 PM

## 2016-07-10 NOTE — Patient Care Conference (Signed)
Inpatient RehabilitationTeam Conference and Plan of Care Update Date: 07/10/2016   Time: 2:45 PM    Patient Name: Tyler AskewDavid Lane      Medical Record Number: [id number]  Date of Birth: [birth year] Sex: Male         Room/Bed: 4W24C/4W24C-01 Payor Info: Payor: [employer] MEDICARE / Plan: [employer] MEDICARE HMO / Product Type: *No Product type* /    Admitting Diagnosis: tbi multitrauma  Admit Date/Time:  07/02/2016  6:27 PM Admission Comments: No comment available   Primary Diagnosis:  Diffuse axonal brain injury (HCC) Principal Problem: Diffuse axonal brain injury [facility] - Inpatient(HCC)  Patient Active Problem List   Diagnosis Date Noted  . SAH (subarachnoid hemorrhage) (HCC) 07/02/2016  . Intracerebral hemorrhage, intraventricular (HCC) 07/02/2016  . Right clavicle fracture 07/02/2016  . Fracture of multiple ribs 07/02/2016  . Sacral fracture (HCC) 07/02/2016  . Chest trauma   . Closed fracture of one rib   . Urinary retention   . Pneumonia of right lower lobe due to methicillin susceptible Staphylococcus aureus (MSSA) (HCC)   . Prerenal azotemia   . Diffuse axonal brain injury (HCC)   . Fracture of clavicle, right, closed 06/22/2016  . TBI (traumatic brain injury) (HCC) 06/21/2016    Expected Discharge Date: Expected Discharge Date: 07/31/16  Team Members Present: Physician leading conference: Dr. Faith RogueZachary Swartz Social Worker Present: Dossie DerBecky Rehema Muffley, [name] Nurse Present: Carmie EndAngie Joyce, RN PT Present: Aleda GranaVictoria Miller, PT OT Present: Callie FieldingKatie Pittman, OT SLP Present: Feliberto Gottronourtney Payne, SLP PPS Coordinator present : Tora DuckMarie Noel, RN, CRRN     Current Status/Progress Goal Weekly Team Focus  Medical   showing some improvement in cognition and arousal. still quite confused with poor insight. pain better overall. bp elevated  increase cognitive abilities, improve insight  nutrition, improve sleep consistency, pain control   Bowel/Bladder   Incontinent of bowel/bladde. LBM 07/09/16  Patient to be continent with  bowel/bladder with mod I  To monitor bowel/bladder function q shift or as needed   Swallow/Nutrition/ Hydration   NPO with NG tube  Min A with least restrictive diet  trials to work towards diet initiation   ADL's   Mod A bathing bed/EOB, Min A UB dressing, Max A LB dressing, Max A toilet transfer, Total A toileting  Min A overall  Cognitive remediation, self care mgt retraining, balance, functional transfers, family education    Mobility   mod assist overall, ambulates 20-25 ft, limited by pain in RLE with movement, poor safety awareness & cognition  min assist overall  gait training, activity tolerance, endurance, strengthening, cognitive remediation, balance   Communication             Safety/Cognition/ Behavioral Observations  Rancho Level V, Total A  Min A  initiation, orientation, attention    Pain   Complained of and Hycet given and was effective  <2  Assess and treat pain q shift and as needed   Skin   Intact  skin to be free from infection/breakdown  Assess skin q shift and as needed    Rehab Goals Patient on target to meet rehab goals: Yes *See Care Plan and progress notes for long and short-term goals.  Barriers to Discharge: se prior    Possible Resolutions to Barriers:  continue cognitive behavioral mgt, stimulant initiated    Discharge Planning/Teaching Needs:  Plan for pt to d/c home with s/o who will share caregiver responsibilities with pt's local daughter.  They are aware will need 24/7 care.  Teaching to be ongoing.  Team Discussion:  Goals set at min assist level. SP working on swallow-awaiting initation improvement then will do anther MBS. More alert-MD started pt on ritalin today. MD addressing pain may try pain patch. Sleeping better. Question is if family can provide the care pt will require at discharge from here or need to pursue NH. Daughter here today and observed pt in therapies.  Revisions to Treatment Plan:  DC 6/19   Continued Need for Acute  Rehabilitation Level of Care: The patient requires daily medical management by a physician with specialized training in physical medicine and rehabilitation for the following conditions: Daily direction of a multidisciplinary physical rehabilitation program to ensure safe treatment while eliciting the highest outcome that is of practical value to the patient.: Yes Daily medical management of patient stability for increased activity during participation in an intensive rehabilitation regime.: Yes Daily analysis of laboratory values and/or radiology reports with any subsequent need for medication adjustment of medical intervention for : Post surgical problems;Neurological problems;Mood/behavior problems  Tyler Lane 07/10/2016, 3:06 PM

## 2016-07-10 NOTE — Progress Notes (Signed)
Physical Therapy Session Note  Patient Details  Name: Tyler AskewDavid Lane MRN: [id number] Date of Birth: 02-21-1950  Today's Date: 07/10/2016 PT Individual Time: 0805-0900 and [id number] PT Individual Time Calculation (min): 55 min and 32 min  Short Term Goals: Week 2:  PT Short Term Goal 1 (Week 2): Pt will ambulate consistently 50 ft with +1 assist. PT Short Term Goal 2 (Week 2): Pt will initate stair training to prepare for home access.  Skilled Therapeutic Interventions/Progress Updates:  Treatment 1: Pt received in bed & required mod assist to upright trunk and limit use of RUE to transfer to sitting EOB. During session pt with behaviors noting pain in RLE with weight bearing; discussed pt's pain with RN & PA & provided rest breaks PRN. Session focused on functional mobility (gait, transfers), activity tolerance, and ADL's. Sitting EOB pt required max assist to don shirt with cuing for threading shirt on BUE & head. Pt with incontinent void but able to perform peri hygiene with assistance for standing balance. Pt required max assist to thread brief & shorts with mod assist to pull them over hips. Therapist donned RUE sling total assist. Pt ambulated 3 ft in room bed>sink with pt = 50% effort. Pt performed hand hygiene standing at sink with mod assist for standing balance, set up assist and max cuing. Transported pt to gym where he ambulated 20 ft with mod assist +1 with therapist providing assistance for weight shifting R (limited by pain) and encouragement to continue task. Pt requires max cuing to complete turns, for hand placement and overall safety awareness with stand>sit. Pt completed w/c<>nu-step with mod assist and utilized nu-step with BLE only on level 1 x 8 minutes. Activity focused on BLE strengthening and weight bearing, as well as attention to task - pt required max cuing to continue participating in task. At end of session pt left sitting in TIS w/c at nurses station with QRB donned.    Treatment 2: Pt received in bed with daughter [name]([name]) present. Pt very drowsy but eventually reporting need to use bathroom. Pt attempted to use urinal but already had incontinent void. Provided max cuing, encouragement, and stimulation to have pt transfer to EOB to don clean brief and to attempt increase in arousal. Pt required max assist to transfer supine>sitting EOB as pt did not initiate movement and required assistance with weight shifting. Pt required multiple attempts and ultimately max assist to transfer sit>stand and mod assist for standing balance to allow a 2nd person to perform peri hygiene and don clean brief. Attempted to have pt transfer to w/c but pt returned to sitting EOB & lying down. Pt required +2 assist to transfer sit>supine and scoot to head of bed. Discussed LOS, current goals, pt's current level of function, recommendations of 24 hr assistance following d/c, and CIR information with pt's daughter. Pt continues to demonstrate fatigue and unable to awaken in bed. Pt missed 28 minutes of skilled PT Treatment 2/2 fatigue. At end of session pt left in bed with alarm set & daughter present to supervise.   Therapy Documentation Precautions:  Precautions Precautions: Fall Precaution Comments: R shoulder sling Other Brace/Splint: has cortrax feeding tube Restrictions Weight Bearing Restrictions: Yes RUE Weight Bearing: Non weight bearing RLE Weight Bearing: Weight bearing as tolerated LLE Weight Bearing: Weight bearing as tolerated   General: PT Amount of Missed Time (min): 28 Minutes PT Missed Treatment Reason: Patient fatigue    See Function Navigator for Current Functional Status.   Therapy/Group: Individual  Therapy  Sandi Mariscal 07/10/2016, 1:47 PM

## 2016-07-11 ENCOUNTER — Inpatient Hospital Stay (HOSPITAL_COMMUNITY): Payer: Medicare HMO | Admitting: Occupational Therapy

## 2016-07-11 ENCOUNTER — Inpatient Hospital Stay (HOSPITAL_COMMUNITY): Payer: Medicare HMO | Admitting: Physical Therapy

## 2016-07-11 ENCOUNTER — Inpatient Hospital Stay (HOSPITAL_COMMUNITY): Payer: Medicare HMO | Admitting: Speech Pathology

## 2016-07-11 LAB — GLUCOSE, CAPILLARY
GLUCOSE-CAPILLARY: 137 mg/dL — AB (ref 65–99)
GLUCOSE-CAPILLARY: 113 mg/dL — AB (ref 65–99)
GLUCOSE-CAPILLARY: 115 mg/dL — AB (ref 65–99)
Glucose-Capillary: 100 mg/dL — ABNORMAL HIGH (ref 65–99)
Glucose-Capillary: 106 mg/dL — ABNORMAL HIGH (ref 65–99)
Glucose-Capillary: 113 mg/dL — ABNORMAL HIGH (ref 65–99)

## 2016-07-11 MED ORDER — METHYLPHENIDATE HCL 5 MG PO TABS
5.0000 mg | ORAL_TABLET | Freq: Once | ORAL | Status: AC
  Administered 2016-07-11: 5 mg via ORAL
  Filled 2016-07-11: qty 1

## 2016-07-11 MED ORDER — METHYLPHENIDATE HCL 5 MG PO TABS
10.0000 mg | ORAL_TABLET | Freq: Two times a day (BID) | ORAL | Status: DC
  Administered 2016-07-12 – 2016-07-24 (×26): 10 mg
  Filled 2016-07-11 (×26): qty 2

## 2016-07-11 NOTE — Progress Notes (Signed)
Speech Language Pathology Weekly Progress Note  Patient Details  Name: Trimaine Maser MRN: [id number] Date of Birth: 09/07/1950  Beginning of progress report period: Jul 03, 2016 End of progress report period: Jul 11, 2016  Short Term Goals: Week 1: SLP Short Term Goal 1 (Week 1): Patient will follow commands in 50% of opportunities with Max A verbal cues.  SLP Short Term Goal 1 - Progress (Week 1): Met SLP Short Term Goal 2 (Week 1): Patient will orient to place and situation with Max A verbal and visual cues.  SLP Short Term Goal 2 - Progress (Week 1): Not met SLP Short Term Goal 3 (Week 1): Patient will demonstrate sustained attention to a functional task for ~2 minutes with Max A multimodal cues.  SLP Short Term Goal 3 - Progress (Week 1): Not met SLP Short Term Goal 4 (Week 1): Patient will request assistance/make needs known during functional tasks with Max A verbal cues.  SLP Short Term Goal 4 - Progress (Week 1): Not met SLP Short Term Goal 5 (Week 1): Patient will consume trials of ice chips with Mod assist multimodal cues for initiation of swallows in less thean 30 seconds with minimal overt s/s of aspiration. SLP Short Term Goal 5 - Progress (Week 1): Met SLP Short Term Goal 6 (Week 1): Patient will consume trials of Dys. 1 textures with thin liquids without overt s/s of aspiration and Mod A verbal cues for swallow initiation in less than 20% of opportunities over 2 sessions prior to upgrade.  SLP Short Term Goal 6 - Progress (Week 1): Not met    New Short Term Goals: Week 2: SLP Short Term Goal 1 (Week 2): Patient will orient to place and situation with Max A verbal and visual cues.  SLP Short Term Goal 2 (Week 2): Patient will demonstrate sustained attention to a functional task for ~2 minutes with Max A multimodal cues.  SLP Short Term Goal 3 (Week 2): Patient will initiate functional tasks within 1 minute with Max A verbal cues . SLP Short Term Goal 4 (Week 2): Patient will  follow commands in 75% of opportunities with Mod A verbal cues.  SLP Short Term Goal 5 (Week 2): Patient will consume trials of Dys. 1 textures with thin liquids without overt s/s of aspiration and Mod A verbal cues for swallow initiation in less than 20% of opportunities over 2 sessions prior to upgrade.   Weekly Progress Updates: Patient has made functional but inconsistent gains this reporting period and has met 2 of 6 STG's. Patient had MBS on 5/25 which demonstrated severe oral dysphagia exacerbated by cognitive deficits. Patient continues to be NPO but is consuming trials of thin liquids via cup and Dys. 1 textures with intermittent overt s/s of aspiration and verbal cues needed for swallow initiation. Patient's greatest barrier to diet initiation continues to be his impaired initiation and decreased sustained attention. Patient continues to demonstrate behaviors consistent with a Rancho level V and requires Max-Total A for orientation, initiation, sustained attention, problem solving, awareness and recall. Patient and family education is ongoing. Patient would benefit from continued skilled SLP intervention to maximize his cognitive and swallowing function prior to discharge in order to maximize his overall functional independence.      Intensity: Minumum of 1-2 x/day, 30 to 90 minutes Frequency: 3 to 5 out of 7 days Duration/Length of Stay: 07/31/16 Treatment/Interventions: Dysphagia/aspiration precaution training;Environmental controls;Cueing hierarchy;Internal/external aids;Patient/family education;Cognitive remediation/compensation;Functional tasks;Therapeutic Activities   [place], [place], [place]  [place], [place] 07/11/2016, 1:22 PM

## 2016-07-11 NOTE — Progress Notes (Signed)
Occupational Therapy Session Note  Patient Details  Name: Tyler Lane MRN: [id number] Date of Birth: 22-Nov-1950  Today's Date: 07/11/2016 OT Individual Time: 1400-1500 OT Individual Time Calculation (min): 60 min    Short Term Goals: Week 1:  OT Short Term Goal 1 (Week 1): Pt will perform UB dressing with mod A to decrease level of assistance with self care.  OT Short Term Goal 2 (Week 1): Pt will sit EOB to wash UB with min A for dynamic sitting balance. OT Short Term Goal 3 (Week 1): Pt will stand with mod A stand balance for LB clothing management.  Skilled Therapeutic Interventions/Progress Updates:    Upon entering the room, pt supine in bed resting with no c/o,signs, or symptoms of pain this session. Skilled OT intervention with focus on initiation, sequence, and functional transfers. Pt requiring 13 minutes to initiate supine >sit and initially resistive towards movement. Pt needing mod A for supine >sit. Pt seated on EOB with close supervision and standing with min A from standard height bed. Stand pivot transfer from bed >wheelchair with mod A and max multimodal cues for technique. Pt propelled to dayroom and oriented with max verbal cues. Pt requesting to return to room secondary to extreme fatigue. He initiates sit >stand from wheelchair to return to bed with min A and 3 minutes for initiation with mod multimodal cues. Pt returning to supine with bed alarm activated and call bell within reach. Pt unable to correctly utilize call bell and OT requested soft call bell.   Therapy Documentation Precautions:  Precautions Precautions: Fall Precaution Comments: R shoulder sling Other Brace/Splint: has cortrax feeding tube Restrictions Weight Bearing Restrictions: Yes RUE Weight Bearing: Non weight bearing RLE Weight Bearing: Weight bearing as tolerated LLE Weight Bearing: Weight bearing as tolerated General:   Vital Signs: Therapy Vitals Temp: 97.7 F (36.5 C) Temp Source:  Oral Pulse Rate: 74 Resp: 18 BP: 127/82 Patient Position (if appropriate): Lying Oxygen Therapy SpO2: 96 % O2 Device: Not Delivered Pain: Pain Assessment Pain Assessment: No/denies pain Pain Score: Asleep Faces Pain Scale: No hurt  See Function Navigator for Current Functional Status.   Therapy/Group: Individual Therapy  [name], Brinlynn Gorton P 07/11/2016, 5:13 PM

## 2016-07-11 NOTE — Progress Notes (Signed)
Speech Language Pathology Daily Session Note  Patient Details  Name: Tyler Lane MRN: [id number] Date of Birth: 14-Sep-1950  Today's Date: 07/11/2016 SLP Individual Time: 1300-1400 SLP Individual Time Calculation (min): 60 min  Short Term Goals: Week 2: SLP Short Term Goal 1 (Week 2): Patient will orient to place and situation with Max A verbal and visual cues.  SLP Short Term Goal 2 (Week 2): Patient will demonstrate sustained attention to a functional task for ~2 minutes with Max A multimodal cues.  SLP Short Term Goal 3 (Week 2): Patient will initiate functional tasks within 1 minute with Max A verbal cues . SLP Short Term Goal 4 (Week 2): Patient will follow commands in 75% of opportunities with Mod A verbal cues.  SLP Short Term Goal 5 (Week 2): Patient will consume trials of Dys. 1 textures with thin liquids without overt s/s of aspiration and Mod A verbal cues for swallow initiation in less than 20% of opportunities over 2 sessions prior to upgrade.   Skilled Therapeutic Interventions: Pt was seen in pm for cognitive linguistic treatment and dysphagia treatment. Pt continues to get main source of nutrition via dht. Pt was given coffee and was allowed to self feed. He consumed appropriate sip sizes and demonstrated no overt s/s of aspiration. Pt completed orientation task with max cues and assist for place and situation. Pt was able to attend to sequencing task with max cues for approx 1 minute. Pt with significant long term and short term memory deficits. Pt completed short term memory exercises with 30% acc.  Pt was able to follow 1 step directions with 30% acc given visual and verbal cues. Pt was left in his bed with bed alarm on and call bell within reach.      Function:  Eating Eating   Modified Consistency Diet: No Eating Assist Level: Help managing cup/glass;Supervision or verbal cues (with trials from SLP )           Cognition Comprehension Comprehension assist level:  Understands basic 25 - 49% of the time/ requires cueing 50 - 75% of the time  Expression   Expression assist level: Expresses basic 25 - 49% of the time/requires cueing 50 - 75% of the time. Uses single words/gestures.  Social Interaction Social Interaction assist level: Interacts appropriately 25 - 49% of time - Needs frequent redirection.  Problem Solving Problem solving assist level: Solves basic 25 - 49% of the time - needs direction more than half the time to initiate, plan or complete simple activities  Memory Memory assist level: Recognizes or recalls 25 - 49% of the time/requires cueing 50 - 75% of the time    Pain Pain Assessment Pain Assessment: No/denies pain  Therapy/Group: Individual Therapy  Isaac LaudJulie G [place] 07/11/2016, 3:12 PM

## 2016-07-11 NOTE — Plan of Care (Signed)
Problem: RH PAIN MANAGEMENT Goal: RH STG PAIN MANAGED AT OR BELOW PT'S PAIN GOAL Outcome: Progressing Goal 3/10

## 2016-07-11 NOTE — Progress Notes (Signed)
Occupational Therapy Session Note  Patient Details  Name: Tyler Lane MRN: [id number] Date of Birth: 02-06-1951  Today's Date: 07/11/2016 OT Individual Time: 0830-1000 OT Individual Time Calculation (min): 90 min    Short Term Goals: Week 1:  OT Short Term Goal 1 (Week 1): Pt will perform UB dressing with mod A to decrease level of assistance with self care.  OT Short Term Goal 2 (Week 1): Pt will sit EOB to wash UB with min A for dynamic sitting balance. OT Short Term Goal 3 (Week 1): Pt will stand with mod A stand balance for LB clothing management.      Skilled Therapeutic Interventions/Progress Updates:    Pt seen for ADL retraining with a focus on initiation, attention, motor planning, balance.  Pt received in bed and agreeable to trying to sit on the [facility]. Pt had a dry brief. Pt sat to EOB with mod A, R arm sling donned, pt completed a stand pivot to [facility] with mod-max A.  Pt immediately needed to use the toilet.  A to cleanse.   He needed CONSTANT cues to not push through his RUE. He would push his arm out of the sling and try to push up on it. This occurred throughout the entire the session.    The stedy was used to move pt from his [facility] onto the tub bench.  Pt pulled up on LUE and therapist had to hold his R arm to prevent him from using it, even with the sling.     On tub bench, pt maintained safe sitting balance.  Pt needed hand over hand assist and/or constant verbal cues to initiate and complete washing his body.  He cannot cross his legs well to reach feet and when bending forward he put too much weight on R arm, so therapist assisted with feet.   After shower, pt donned shirt with extra time and verbal cues, donned arm sling and set pt up in stedy.  At least 8 times, when starting to stand he continually took his feet off the foot pedal. Even with numerous cues, pt could not maintain his feet into position.  Therefore, used a stand pivot transfer instead to w/c with max A to  stabilize balance.    From w/c, pt worked on sit to stand with L hand on bed rail for support. He donned shorts with min A from sitting and then stood and was able to stand with steadying A while pulling shorts over hips with B hands.    Grooming at the sink.  Had pt place in his upper denture but he could not get a good fit and took it out.  To reduce the amount pt puts wt through his RUE which is supposed to be non wt bearing, had pt clasp his hands together and then practice sit >< stand 5x with contact guard. Pt maintained his hands together as he completed stand pivot to the bed.  Pt stated he was very tired. Pt adjusted in bed with all needs met.  Bed alarm set.     Therapy Documentation Precautions:  Precautions Precautions: Fall Precaution Comments: R shoulder sling Other Brace/Splint: has cortrax feeding tube Restrictions Weight Bearing Restrictions: Yes RUE Weight Bearing: Non weight bearing RLE Weight Bearing: Weight bearing as tolerated LLE Weight Bearing: Weight bearing as tolerated   Pain: no c/o pain    ADL:    See Function Navigator for Current Functional Status.   Therapy/Group: Individual Therapy  [place] 07/11/2016,  12:09 PM

## 2016-07-11 NOTE — Progress Notes (Signed)
[facility] PHYSICAL MEDICINE & REHABILITATION     PROGRESS NOTE    Subjective/Complaints: Slept fairly well last night. Up and awake in bed  ROS: Limited due cognitive/behavioral     Objective: Vital Signs: Blood pressure 135/81, pulse 65, temperature 97.9 F (36.6 C), temperature source Oral, resp. rate 18, weight 75.6 kg (166 lb 9.6 oz), SpO2 97 %. No results found. No results for input(s): WBC, HGB, HCT, PLT in the last 72 hours.  Recent Labs  07/09/16 0604  NA 141  K 4.1  CL 104  GLUCOSE 103*  BUN 29*  CREATININE 0.85  CALCIUM 9.2   CBG (last 3)   Recent Labs  07/11/16 0415 07/11/16 0841 07/11/16 1200  GLUCAP 100* 113* 115*    Wt Readings from Last 3 Encounters:  07/11/16 75.6 kg (166 lb 9.6 oz)  07/02/16 74.3 kg (163 lb 12.8 oz)    Physical Exam:  Constitutional: He appears well-developed and well-nourished. No distress    HENT:  Head: Normocephalic.  Right Ear: External ear normal.  Left Ear: External ear normal.  Mouth/Throat: Oropharynx is clear and moist.  Eyes: Conjunctivae and EOM are normal. Pupils are equal, round, and reactive to light. Right eye exhibits no discharge. Left eye exhibits no discharge.  Neck: Normal range of motion. Neck supple.  Cardiovascular: RRR Respiratory:  CTA B GI: Soft. Bowel sounds are normal. He exhibits no distension. There is no tenderness.  Musculoskeletal: right shoulder tender, in sling Neurological: He is fairly alert.  Oriented to person. Language of confusion. Follows all commands. Limited insight and awareness.  Motor  : moves all 4.  A&Ox1 only HOH  Skin: Skin is warm and dry.  Psychiatric: cooperative   Assessment/Plan: 1. 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 Physiatrist is providing close team supervision and 24 hour management of active medical problems listed below. Physiatrist  and rehab team continue to assess barriers to discharge/monitor patient progress toward functional and medical goals.  Function:  Bathing Bathing position Bathing activity did not occur: Refused Position: Shower  Bathing parts Body parts bathed by patient: Chest, Abdomen, Left arm, Right arm, Left upper leg, Front perineal area, Buttocks, Right upper leg Body parts bathed by helper: Right lower leg, Left lower leg, Back  Bathing assist Assist Level: Touching or steadying assistance(Pt > 75%), Supervision or verbal cues      Upper Body Dressing/Undressing Upper body dressing   What is the patient wearing?: Pull over shirt/dress     Pull over shirt/dress - Perfomed by patient: Thread/unthread right sleeve, Thread/unthread left sleeve, Put head through opening, Pull shirt over trunk Pull over shirt/dress - Perfomed by helper: Pull shirt over trunk        Upper body assist Assist Level: Supervision or verbal cues      Lower Body Dressing/Undressing Lower body dressing   What is the patient wearing?: Pants, Socks, Shoes     Pants- Performed by patient: Thread/unthread left pants leg, Pull pants up/down Pants- Performed by helper: Thread/unthread right pants leg Non-skid slipper socks- Performed by patient: Don/doff left sock Non-skid slipper socks- Performed by helper: Don/doff right sock   Socks - Performed by helper: Don/doff right sock, Don/doff left sock   Shoes - Performed by helper: Don/doff right shoe, Don/doff left shoe          Lower body assist Assist for lower body dressing: Touching or steadying  assistance (Pt > 75%)      Toileting Toileting Toileting activity did not occur: No continent bowel/bladder event   Toileting steps completed by helper: Adjust clothing prior to toileting, Performs perineal hygiene (removed clothing for shower, pt was continent)    Toileting assist Assist level: Touching or steadying assistance (Pt.75%), Supervision or verbal cues    Transfers Chair/bed transfer Chair/bed transfer activity did not occur: Safety/medical concerns Chair/bed transfer method: Ambulatory Chair/bed transfer assist level: Moderate assist (Pt 50 - 74%/lift or lower) Chair/bed transfer assistive device: Armrests     Locomotion Ambulation Ambulation activity did not occur: Safety/medical concerns   Max distance: 25 ft Assist level: Moderate assist (Pt 50 - 74%)   Wheelchair Wheelchair activity did not occur: Safety/medical concerns   Max wheelchair distance: 20' Assist Level: Maximal assistance (Pt 25 - 49%) (BLE)  Cognition Comprehension Comprehension assist level: Understands basic 25 - 49% of the time/ requires cueing 50 - 75% of the time  Expression Expression assist level: Expresses basic 25 - 49% of the time/requires cueing 50 - 75% of the time. Uses single words/gestures.  Social Interaction Social Interaction assist level: Interacts appropriately 25 - 49% of time - Needs frequent redirection.  Problem Solving Problem solving assist level: Solves basic 25 - 49% of the time - needs direction more than half the time to initiate, plan or complete simple activities  Memory Memory assist level: Recognizes or recalls 25 - 49% of the time/requires cueing 50 - 75% of the time   Medical Problem List and Plan: 1.  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  DVT Prophylaxis/Anticoagulation: Mechanical: Sequential compression devices, below knee Bilateral lower extremities 3. Pain Management: Hydrocodone prn. Monitor for sedation.  4. Mood: [name] to follow for evaluation and support as mentation improves.  5. Neuropsych: This patient is not capable of making decisions on his own behalf.  - added low dose ritalin for attention/arousal--increase to 10mg  6. Skin/Wound Care: routine skin care 7. Fluids/Electrolytes/Nutrition: Monitor I/O. Increase  water flushes  8. Right displaced sacral and right pubic rami fracture: WBAT 9. Right clavicle fracture: NWB --to keep arm below 90 degrees and sling when out of bed. 10. Acute urinary retention: Monitor voiding with PVR checks.  toilet patient every 4 hours.   -ua neg, ucx neg also 11. MSSA RLL PNA:  . Rocephin complete    -afebrile currently. No clinical signs of active disease 12. Pre-renal azotemia: changed tube feed to Nephro. Increased water flushes to qid.   - BUN decreased to 29 ---continue same h2o flushes--recheck Thursday 13. HTN: On metoprolol bid.  Fair control at present    LOS (Days) 9 A FACE TO FACE EVALUATION WAS PERFORMED  Ranelle OysterSWARTZ,Barrie Wale T, MD 07/11/2016 12:39 PM

## 2016-07-11 NOTE — Progress Notes (Signed)
Physical Therapy Session Note  Patient Details  Name: Tyler AskewDavid Engel MRN: [id number] Date of Birth: [birth year]  Today's Date: 07/11/2016 PT Individual Time: 1105-1200 PT Individual Time Calculation (min): 55 min   Short Term Goals: Week 2:  PT Short Term Goal 1 (Week 2): Pt will ambulate consistently 50 ft with +1 assist. PT Short Term Goal 2 (Week 2): Pt will initate stair training to prepare for home access.  Skilled Therapeutic Interventions/Progress Updates:  Pt received in bed & agreeable to tx. Session focused on functional mobility (transfers, bed mobility, gait) and cognitive remediation (initiation, attention to task). Pt requires min/mod assist to transfer in/out of bed; pt with poor initiation when transferring out of bed and requires assistance with this and weight shifting. Pt ambulated 25 ft + 15 ft with mod assist with LUE on therapist's shoulders. Therapist provided manual facilitation for weight shifting R; pt with pain with weight bearing on R but appears to be less than yesterday. In day room pt engaged in pet therapy and casual conversation. Pt unable to recall name of his dog nor hometown or current city during conversation, otherwise pt appropriate throughout interaction. Pt engaged in standing activity (catching/tossing beach ball with LUE) with min assist for standing balance. Pt transferred to sitting in w/c reporting he was tired & unwilling to stand again. Attempted to have pt perform cognitive task - set up chess board then place pawns into box. Pt with very poor initiation and attention to task as he frequently closed eyes 2/2 fatigue. At end of session pt assisted back to bed & left with all needs within reach, alarm set, & RN present.   Therapy Documentation Precautions:  Precautions Precautions: Fall Precaution Comments: R shoulder sling Other Brace/Splint: has cortrax feeding tube Restrictions Weight Bearing Restrictions: Yes RUE Weight Bearing: Non weight  bearing RLE Weight Bearing: Weight bearing as tolerated LLE Weight Bearing: Weight bearing as tolerated  Pain: Pt grimacing with weight bearing on RLE but appears to be less painful than yesterday.  See Function Navigator for Current Functional Status.   Therapy/Group: Individual Therapy  Sandi MariscalVictoria M Stormee Duda 07/11/2016, 12:16 PM

## 2016-07-11 NOTE — Progress Notes (Signed)
Nutrition Follow-up  DOCUMENTATION CODES:   Not applicable  INTERVENTION:  Continue Nepro formula via Cortrak NGT at goal rate of 55 ml/hr x 20 hours (may hold TF for up to 4 hours for therapy) with 30 ml Prostat BID to provide 2180 kcal (100% of needs), 119 grams of protein, and 803 ml of free water.   Continue free water flushes of 200 ml every 6 hours via tube. Total free water: 1603 ml/day  RD to continue to monitor.   NUTRITION DIAGNOSIS:   Inadequate oral intake related to inability to eat as evidenced by NPO status; ongoing  GOAL:   Patient will meet greater than or equal to 90% of their needs; met  MONITOR:   TF tolerance, Weight trends, Labs, Skin, I & O's  REASON FOR ASSESSMENT:   Consult Enteral/tube feeding initiation and management  ASSESSMENT:   66 y.o. male with history of HTN, depression, hearing loss who was admitted on 06/21/16 after unwitnessed fall off 10' ladder. CT head done showing mild acute SAH along sylvian fissures bilaterally, at high left parietal lobe and righ frontal lobe. Work up also revealed displaced fracture of right mid clavicle, right 2nd to 6 rib fractures with pulmonary contusion, contusion, mildly displaced comminuted fracture of right pubic ramus and comminuted transverse fracture of proximal right pubic ramus and compression deformity of 6th and 7th thoracic vertebral body.  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  Pt has been tolerating his tube feeds with no other difficulties. RD to continue with current orders. Per MD note,  will continue with same water flushes and recheck labs on Thursday. RD to continue to monitor.   Diet Order:  Diet NPO time specified  Skin:  Reviewed, no issues  Last BM:  5/29  Height:   Ht Readings from Last 1 Encounters:  06/21/16 5' 11"  (1.803 m)    Weight:   Wt Readings from Last 1 Encounters:  07/11/16 166 lb 9.6 oz (75.6 kg)    Ideal Body Weight:  78 kg  BMI:  Body mass index is 23.24 kg/m.  Estimated Nutritional Needs:   Kcal:  2050-2250  Protein:  110-125 grams  Fluid:  >2 L/day  EDUCATION NEEDS:   No education needs identified at this time  Corrin Parker, MS, RD, LDN Pager # [phone] After hours/ weekend pager # [phone]

## 2016-07-12 ENCOUNTER — Inpatient Hospital Stay (HOSPITAL_COMMUNITY): Payer: Medicare HMO | Admitting: Physical Therapy

## 2016-07-12 ENCOUNTER — Inpatient Hospital Stay (HOSPITAL_COMMUNITY): Payer: Medicare HMO | Admitting: Occupational Therapy

## 2016-07-12 ENCOUNTER — Inpatient Hospital Stay (HOSPITAL_COMMUNITY): Payer: Medicare HMO | Admitting: Speech Pathology

## 2016-07-12 DIAGNOSIS — J15211 Pneumonia due to Methicillin susceptible Staphylococcus aureus: Secondary | ICD-10-CM

## 2016-07-12 LAB — GLUCOSE, CAPILLARY
GLUCOSE-CAPILLARY: 102 mg/dL — AB (ref 65–99)
Glucose-Capillary: 103 mg/dL — ABNORMAL HIGH (ref 65–99)
Glucose-Capillary: 112 mg/dL — ABNORMAL HIGH (ref 65–99)
Glucose-Capillary: 117 mg/dL — ABNORMAL HIGH (ref 65–99)
Glucose-Capillary: 118 mg/dL — ABNORMAL HIGH (ref 65–99)
Glucose-Capillary: 120 mg/dL — ABNORMAL HIGH (ref 65–99)
Glucose-Capillary: 121 mg/dL — ABNORMAL HIGH (ref 65–99)

## 2016-07-12 LAB — BASIC METABOLIC PANEL
Anion gap: 8 (ref 5–15)
BUN: 23 mg/dL — ABNORMAL HIGH (ref 6–20)
CALCIUM: 9 mg/dL (ref 8.9–10.3)
CO2: 28 mmol/L (ref 22–32)
CREATININE: 0.86 mg/dL (ref 0.61–1.24)
Chloride: 100 mmol/L — ABNORMAL LOW (ref 101–111)
GFR calc non Af Amer: >60 mL/min (ref >60)
Glucose, Bld: 94 mg/dL (ref 65–99)
Potassium: 3.8 mmol/L (ref 3.5–5.1)
Sodium: 136 mmol/L (ref 135–145)

## 2016-07-12 NOTE — Progress Notes (Signed)
Physical Therapy Session Note  Patient Details  Name: Tyler AskewDavid Remillard MRN: [id number] Date of Birth: 19-Oct-1950  Today's Date: 07/12/2016 PT Individual Time: 0900-0944 PT Individual Time Calculation (min): 44 min    Skilled Therapeutic Interventions/Progress Updates:    Pt seated at nursing station at beginning of session.  Session focused on sustained attention to task as well as improving functional mobility and initiation to decrease burden of care.  Pt performed sit to stand in blocked practice for 1 x 10 with min assist.  Pt requires significant cues to attend to task.  Pt later performed 1 x 5 sit to stand and again required cues to attend to task.  Pt ambulated 15 ft x 2 reps with +2 assist on first attempt due to poor initiation; on second attempt, after initiation of gait, pt able to ambulate 15 ft with moderate assist x 1.  Pt requires significantly increased time to process cues (manual or verbal).  Pt additionally worked on sorting beetle game pieces into colors again working on sustained attention.  Pt has difficulty maintaining R UE NWB due to frequently leaning forwards onto UE for rest and requires significant verbal cueing to return to upright sitting in order to maintain precaution.  Following session, pt left with nursing in room for issuing meds with lap belt in place.  Therapy Documentation Precautions:  Precautions Precautions: Fall Precaution Comments: R shoulder sling Other Brace/Splint: has cortrax feeding tube Restrictions Weight Bearing Restrictions: Yes RUE Weight Bearing: Non weight bearing RLE Weight Bearing: Weight bearing as tolerated LLE Weight Bearing: Weight bearing as tolerated   See Function Navigator for Current Functional Status.   Therapy/Group: Individual Therapy  Hang Ammon Elveria RisingM Royal Vandevoort 07/12/2016, 11:23 AM

## 2016-07-12 NOTE — Progress Notes (Signed)
Social Work Patient ID: Tyler Lane, male   DOB: Mar 28, 1950, 66 y.o.   MRN: [id number]  Spoke with [name]-daughter via telephone to discuss team conference goals min assist level and target discharge 6/19. She had many questions regarding MD recommendation of where to go after rehab and also what the insurance will cover. She wants the best environment for him where he will get the most rehab after discharge. Her sister is currently applying for medicaid for him. Lucy to follow up with her on Tuesday after next conference.

## 2016-07-12 NOTE — Progress Notes (Signed)
Occupational Therapy Weekly Progress Note  Patient Details  Name: Tyler Lane MRN: [id number] Date of Birth: 1950/11/02  Beginning of progress report period: Jul 03, 2016 End of progress report period: Jul 12, 2016  Today's Date: 07/12/2016 OT Individual Time: 1445-1526 OT Individual Time Calculation (min): 41 min    Patient has met 4 of 4 short term goals. Pt making steady progress towards occupational therapy goals this week. Pt's biggest barrier continues to be cognition and fatigue as the day progresses. Pt agreeable each session to work with OT. Pt able to perform functional transfers with min - max A. Pt engaging in self care tasks of bathing and dressing with mod- max multimodal cues for initiation and sequencing of tasks.  Patient continues to demonstrate the following deficits: muscle weakness, decreased cardiorespiratoy endurance, decreased initiation, decreased attention, decreased awareness, decreased problem solving, decreased safety awareness and delayed processing and decreased sitting balance, decreased standing balance and decreased balance strategies and therefore will continue to benefit from skilled OT intervention to enhance overall performance with BADL.  Patient progressing toward long term goals..  Continue plan of care.  OT Short Term Goals Week 1:  OT Short Term Goal 1 (Week 1): Pt will perform UB dressing with mod A to decrease level of assistance with self care.  OT Short Term Goal 1 - Progress (Week 1): Met OT Short Term Goal 2 (Week 1): Pt will sit EOB to wash UB with min A for dynamic sitting balance. OT Short Term Goal 2 - Progress (Week 1): Met OT Short Term Goal 3 (Week 1): Pt will stand with mod A stand balance for LB clothing management. OT Short Term Goal 3 - Progress (Week 1): Met Week 2:  OT Short Term Goal 1 (Week 2): Pt will perform toilet transfer with min A. OT Short Term Goal 2 (Week 2): Pt will perform bathing at shower level with mod A and  mod multimodal cues for routine task.   Skilled Therapeutic Interventions/Progress Updates:    Upon entering the room, pt in bed with no c/o,signs, or symptoms of pain. Pt transferred from bed > wheelchair with mod A with increased time and mod multimodal cues for foot placement. Pt transferred from wheelchair >toilet with max A for lifting and lowering assistance. Pt required assistance for clothing management and hygiene but able to have successful BM and voiding of urine with increased time. Pt returned to wheelchair in same manner. Quick release belt donned and RN arrived to give medications.   Therapy Documentation Precautions:  Precautions Precautions: Fall Precaution Comments: R shoulder sling Other Brace/Splint: has cortrax feeding tube Restrictions Weight Bearing Restrictions: Yes RUE Weight Bearing: Non weight bearing RLE Weight Bearing: Weight bearing as tolerated LLE Weight Bearing: Weight bearing as tolerated General:   Vital Signs: Therapy Vitals Temp: 97.9 F (36.6 C) Temp Source: Oral Pulse Rate: 66 Resp: 18 BP: 126/85 Patient Position (if appropriate): Lying Oxygen Therapy SpO2: 97 % O2 Device: Not Delivered Pain: Pain Assessment Pain Score: 0-No pain ADL:   Vision   Perception    Praxis   Exercises:   Other Treatments:    See Function Navigator for Current Functional Status.   Therapy/Group: Individual Therapy  Gypsy Decant 07/12/2016, 4:52 PM

## 2016-07-12 NOTE — Progress Notes (Signed)
Physical Therapy Session Note  Patient Details  Name: Tyler Lane MRN: [id number] Date of Birth: 04-15-1950  Today's Date: 07/12/2016 PT Individual Time: 8099-8338   Individual time: 54 min   Short Term Goals:  Week 2:  PT Short Term Goal 1 (Week 2): Pt will ambulate consistently 50 ft with +1 assist. PT Short Term Goal 2 (Week 2): Pt will initate stair training to prepare for home access.  Skilled Therapeutic Interventions/Progress Updates:   Pt received sitting in WC and agreeable to PT  PT transported pt to rehab gym with. Stand pivot transfer to Nustep with mod assist.   Nustep reciprocal movement training BLE only on nustep x 10 minutes, level 1. Pt required prolonged therapeutic rest break after 5 minutes and end of nustep training. Min cues for continued attention to task and continued participation.   Sit<>stand transfer training completed x 5 throughout treatment with min-mod assist from PT and moderate cues for initiation of movement.   Gait training x 33f with mod assist from PT. Pt noted to have antalgic gait on the RLE, but reports no pain moderate cues of improved step length and decreased use of unstable objects for UE support. [facility] to with min assist moderate cues for proper UE placement to allow pivot to chair.   Patient returned too RN station [facility]          Therapy Documentation Precautions:  Precautions Precautions: Fall Precaution Comments: R shoulder sling Other Brace/Splint: has cortrax feeding tube Restrictions Weight Bearing Restrictions: Yes RUE Weight Bearing: Non weight bearing RLE Weight Bearing: Weight bearing as tolerated LLE Weight Bearing: Weight bearing as tolerated    Vital Signs: Therapy Vitals Temp: 97.9 F (36.6 C) Temp Source: Oral Pulse Rate: 66 Resp: 18 BP: 126/85 Patient Position (if appropriate): Lying Oxygen Therapy SpO2: 97 % O2 Device:  Not Delivered Pain: Pain Assessment Pain Score: 0-No pain   See Function Navigator for Current Functional Status.   Therapy/Group: Individual Therapy  ALorie Phenix5/31/2018, 4:24 PM

## 2016-07-12 NOTE — Progress Notes (Signed)
Occupational Therapy Session Note  Patient Details  Name: Tyler Lane MRN: [id number] Date of Birth: 1950/06/29  Today's Date: 07/12/2016 OT Individual Time: [id number] OT Individual Time Calculation (min): 79 min    Short Term Goals: Week 1:  OT Short Term Goal 1 (Week 1): Pt will perform UB dressing with mod A to decrease level of assistance with self care.  OT Short Term Goal 2 (Week 1): Pt will sit EOB to wash UB with min A for dynamic sitting balance. OT Short Term Goal 3 (Week 1): Pt will stand with mod A stand balance for LB clothing management.  Skilled Therapeutic Interventions/Progress Updates:    Pt received at RN station,and OT assisted pt back to room. Pt declined shower but agreeable to dressing and bathing from wheelchair at sink. Pt required mod cues for initiation of routine tasks at sink. Environment with minimal external distractions present. Pt initiated standing within 2 minutes and standing for 4 minutes with min A for balance while pt bathing buttocks and peri area. Pt required cues and assistance to cross opposite ankle to knee to don socks. Pt initially placing B LE's into one side of shorts and needing max multmodal cues for problem solving to adjust clothing properly. Pt's daughter arriving at end of session and OT provided education regarding discharge planning, pt's progress, and current goals. Pt returning to bed with mod A stand pivot transfer. Bed alarm activated and soft call bell within reach.   Therapy Documentation Precautions:  Precautions Precautions: Fall Precaution Comments: R shoulder sling Other Brace/Splint: has cortrax feeding tube Restrictions Weight Bearing Restrictions: Yes RUE Weight Bearing: Non weight bearing RLE Weight Bearing: Weight bearing as tolerated LLE Weight Bearing: Weight bearing as tolerated Pain: Pain Assessment Pain Score: 0-No pain  See Function Navigator for Current Functional Status.   Therapy/Group: Individual  Therapy  [name], Kaleya Douse P 07/12/2016, 12:42 PM

## 2016-07-12 NOTE — Progress Notes (Signed)
[place] PHYSICAL MEDICINE & REHABILITATION     PROGRESS NOTE    Subjective/Complaints: Pt sitting alert in bed. Still distracted.   ROS: Limited due cognitive/behavioral     Objective: Vital Signs: Blood pressure 137/80, pulse 67, temperature 98 F (36.7 C), temperature source Axillary, resp. rate 18, weight 71.2 kg (157 lb), SpO2 97 %. No results found. No results for input(s): WBC, HGB, HCT, PLT in the last 72 hours.  Recent Labs  07/12/16 0608  NA 136  K 3.8  CL 100*  GLUCOSE 94  BUN 23*  CREATININE 0.86  CALCIUM 9.0   CBG (last 3)   Recent Labs  07/11/16 2359 07/12/16 0505 07/12/16 0825  GLUCAP 112* 121* 120*    Wt Readings from Last 3 Encounters:  07/12/16 71.2 kg (157 lb)  07/02/16 74.3 kg (163 lb 12.8 oz)    Physical Exam:  Constitutional: He appears well-developed and well-nourished. No distress    HENT:  Head: Normocephalic.  Right Ear: External ear normal.  Left Ear: External ear normal.  Mouth/Throat: Oropharynx is clear and moist.  Eyes: Conjunctivae and EOM are normal. Pupils are equal, round, and reactive to light. Right eye exhibits no discharge. Left eye exhibits no discharge.  Neck: Normal range of motion. Neck supple.  Cardiovascular: RRR Respiratory:  CTA B GI: Soft. Bowel sounds are normal. He exhibits no distension. There is no tenderness.  Musculoskeletal: right shoulder tender, in sling Neurological: He is fairly alert.  Oriented to person. Language of confusion still present. Follows all commands. Limited insight and awareness.  Motor  : moves all 4.  A&Ox1 only. distracted HOH  Skin: Skin is warm and dry.  Psychiatric: cooperative   Assessment/Plan: 1. 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 Physiatrist is providing close team supervision and 24 hour management of active medical problems listed  below. Physiatrist and rehab team continue to assess barriers to discharge/monitor patient progress toward functional and medical goals.  Function:  Bathing Bathing position Bathing activity did not occur: Refused Position: Shower  Bathing parts Body parts bathed by patient: Chest, Abdomen, Left arm, Right arm, Left upper leg, Front perineal area, Buttocks, Right upper leg Body parts bathed by helper: Right lower leg, Left lower leg, Back  Bathing assist Assist Level: Touching or steadying assistance(Pt > 75%), Supervision or verbal cues      Upper Body Dressing/Undressing Upper body dressing   What is the patient wearing?: Pull over shirt/dress     Pull over shirt/dress - Perfomed by patient: Thread/unthread right sleeve, Thread/unthread left sleeve, Put head through opening, Pull shirt over trunk Pull over shirt/dress - Perfomed by helper: Pull shirt over trunk        Upper body assist Assist Level: Supervision or verbal cues      Lower Body Dressing/Undressing Lower body dressing   What is the patient wearing?: Pants, Socks, Shoes     Pants- Performed by patient: Thread/unthread left pants leg, Pull pants up/down Pants- Performed by helper: Thread/unthread right pants leg Non-skid slipper socks- Performed by patient: Don/doff left sock Non-skid slipper socks- Performed by helper: Don/doff right sock   Socks - Performed by helper: Don/doff right sock, Don/doff left sock   Shoes - Performed by helper: Don/doff right shoe, Don/doff left shoe          Lower body assist Assist for lower body dressing: Touching or steadying assistance (Pt >  75%)      Toileting Toileting Toileting activity did not occur: No continent bowel/bladder event   Toileting steps completed by helper: Adjust clothing prior to toileting, Performs perineal hygiene, Adjust clothing after toileting    Toileting assist Assist level: Touching or steadying assistance (Pt.75%)   Transfers Chair/bed  transfer Chair/bed transfer activity did not occur: Safety/medical concerns Chair/bed transfer method: Ambulatory Chair/bed transfer assist level: Moderate assist (Pt 50 - 74%/lift or lower) Chair/bed transfer assistive device: Armrests     Locomotion Ambulation Ambulation activity did not occur: Safety/medical concerns   Max distance: 25 ft Assist level: Moderate assist (Pt 50 - 74%)   Wheelchair Wheelchair activity did not occur: Safety/medical concerns   Max wheelchair distance: 20' Assist Level: Maximal assistance (Pt 25 - 49%) (BLE)  Cognition Comprehension Comprehension assist level: Understands basic 25 - 49% of the time/ requires cueing 50 - 75% of the time  Expression Expression assist level: Expresses basic 25 - 49% of the time/requires cueing 50 - 75% of the time. Uses single words/gestures.  Social Interaction Social Interaction assist level: Interacts appropriately 25 - 49% of time - Needs frequent redirection.  Problem Solving Problem solving assist level: Solves basic 25 - 49% of the time - needs direction more than half the time to initiate, plan or complete simple activities  Memory Memory assist level: Recognizes or recalls 25 - 49% of the time/requires cueing 50 - 75% of the time   Medical Problem List and Plan: 1.  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  DVT Prophylaxis/Anticoagulation: Mechanical: Sequential compression devices, below knee Bilateral lower extremities 3. Pain Management: Hydrocodone prn. Monitor for sedation.  4. Mood: [name] to follow for evaluation and support as mentation improves.  5. Neuropsych: This patient is not capable of making decisions on his own behalf.  -  ritalin for attention/arousal-- 10mg  6. Skin/Wound Care: routine skin care 7. Fluids/Electrolytes/Nutrition: Monitor I/O. Increase water flushes  8.  Right displaced sacral and right pubic rami fracture: WBAT 9. Right clavicle fracture: NWB --to keep arm below 90 degrees and sling when out of bed. 10. Acute urinary retention: Monitor voiding with PVR checks.  toilet patient every 4 hours.   -ua neg, ucx neg also 11. MSSA RLL PNA:  . Rocephin complete    -afebrile currently. No clinical signs of active disease 12. Pre-renal azotemia: changed tube feed to Nephro. Increased water flushes to qid.   - BUN trending down to 23. Continue current flushes  -clears per SLP 13. HTN: On metoprolol bid.  Fair control at present    LOS (Days) 10 A FACE TO FACE EVALUATION WAS PERFORMED  Ranelle Oyster, MD 07/12/2016 10:02 AM

## 2016-07-12 NOTE — Progress Notes (Signed)
Speech Language Pathology Daily Session Note  Patient Details  Name: Tyler AskewDavid Lane MRN: [id number] Date of Birth: [birth year]  Today's Date: 07/12/2016 SLP Individual Time: 0730-0830 SLP Individual Time Calculation (min): 60 min  Short Term Goals: Week 2: SLP Short Term Goal 1 (Week 2): Patient will orient to place and situation with Max A verbal and visual cues.  SLP Short Term Goal 2 (Week 2): Patient will demonstrate sustained attention to a functional task for ~2 minutes with Max A multimodal cues.  SLP Short Term Goal 3 (Week 2): Patient will initiate functional tasks within 1 minute with Max A verbal cues . SLP Short Term Goal 4 (Week 2): Patient will follow commands in 75% of opportunities with Mod A verbal cues.  SLP Short Term Goal 5 (Week 2): Patient will consume trials of Dys. 1 textures with thin liquids without overt s/s of aspiration and Mod A verbal cues for swallow initiation in less than 20% of opportunities over 2 sessions prior to upgrade.   Skilled Therapeutic Interventions: Skilled treatment session focused on cognitive and dysphagia goals. SLP facilitated session by providing extra time and Max A verbal cues for problem solving and initiation during basic self-care tasks and transfer from bed to wheelchair via the [employer]. Patient also required total A for orientation. Patient consumed trials of thin liquids via cup without overt s/s of aspiration and required Mod verbal cues for use of small sips. Patient declined trials of solids despite encouragement, suspect due to cognitive impairments and possibly feeling full from tube feeds. Therefore, recommend patient initiate a clear liquid diet. Patient left upright in wheelchair at RN station. Continue with current plan of care.      Function:  Eating Eating   Modified Consistency Diet: Yes (with trials from SLP) Eating Assist Level: More than reasonable amount of time;Supervision or verbal cues   Eating Set Up Assist For:  Parenteral or tube feed supplies       Cognition Comprehension Comprehension assist level: Understands basic 25 - 49% of the time/ requires cueing 50 - 75% of the time  Expression   Expression assist level: Expresses basic 25 - 49% of the time/requires cueing 50 - 75% of the time. Uses single words/gestures.  Social Interaction Social Interaction assist level: Interacts appropriately 25 - 49% of time - Needs frequent redirection.  Problem Solving Problem solving assist level: Solves basic 25 - 49% of the time - needs direction more than half the time to initiate, plan or complete simple activities  Memory Memory assist level: Recognizes or recalls 25 - 49% of the time/requires cueing 50 - 75% of the time    Pain No/Denies Pain   Therapy/Group: Individual Therapy  [name] 07/12/2016, 8:45 AM

## 2016-07-13 ENCOUNTER — Inpatient Hospital Stay (HOSPITAL_COMMUNITY): Payer: Medicare HMO | Admitting: Speech Pathology

## 2016-07-13 ENCOUNTER — Inpatient Hospital Stay (HOSPITAL_COMMUNITY): Payer: Medicare HMO | Admitting: Occupational Therapy

## 2016-07-13 ENCOUNTER — Inpatient Hospital Stay (HOSPITAL_COMMUNITY): Payer: Medicare HMO | Admitting: Physical Therapy

## 2016-07-13 LAB — GLUCOSE, CAPILLARY
GLUCOSE-CAPILLARY: 110 mg/dL — AB (ref 65–99)
GLUCOSE-CAPILLARY: 117 mg/dL — AB (ref 65–99)
GLUCOSE-CAPILLARY: 126 mg/dL — AB (ref 65–99)
Glucose-Capillary: 109 mg/dL — ABNORMAL HIGH (ref 65–99)
Glucose-Capillary: 108 mg/dL — ABNORMAL HIGH (ref 65–99)
Glucose-Capillary: 116 mg/dL — ABNORMAL HIGH (ref 65–99)

## 2016-07-13 NOTE — Progress Notes (Signed)
[place] PHYSICAL MEDICINE & REHABILITATION     PROGRESS NOTE    Subjective/Complaints: Up in bed. No problems overnight reported  ROS: Limited due cognitive/behavioral      Objective: Vital Signs: Blood pressure (!) 143/96, pulse 68, temperature 98.2 F (36.8 C), temperature source Oral, resp. rate 18, weight 71.4 kg (157 lb 4.8 oz), SpO2 94 %. No results found. No results for input(s): WBC, HGB, HCT, PLT in the last 72 hours.  Recent Labs  07/12/16 0608  NA 136  K 3.8  CL 100*  GLUCOSE 94  BUN 23*  CREATININE 0.86  CALCIUM 9.0   CBG (last 3)   Recent Labs  07/12/16 2341 07/13/16 0406 07/13/16 0750  GLUCAP 117* 110* 117*    Wt Readings from Last 3 Encounters:  07/13/16 71.4 kg (157 lb 4.8 oz)  07/02/16 74.3 kg (163 lb 12.8 oz)    Physical Exam:  Constitutional: He appears well-developed and well-nourished. No distress    HENT:  Head: Normocephalic.  Right Ear: External ear normal.  Left Ear: External ear normal.  Mouth/Throat: Oropharynx is clear and moist.  Eyes: Conjunctivae and EOM are normal. Pupils are equal, round, and reactive to light. Right eye exhibits no discharge. Left eye exhibits no discharge.  Neck: supple  Cardiovascular: RRR Respiratory:  CTA B GI: Soft. Bowel sounds are normal. He exhibits no distension. There is no tenderness.  Musculoskeletal: right shoulder tender, in sling Neurological: He is fairly alert.  Oriented to person. Language of confusion still present. Follows all commands. Limited insight and awareness.  Motor: moves all 4.  A&Ox1 only. distracted HOH  Skin: Skin is warm and dry.  Psychiatric: cooperative   Assessment/Plan: 1. 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 Physiatrist is providing close team supervision and 24 hour management of active medical problems listed below. Physiatrist and rehab  team continue to assess barriers to discharge/monitor patient progress toward functional and medical goals.  Function:  Bathing Bathing position Bathing activity did not occur: Refused Position: Wheelchair/chair at sink  Bathing parts Body parts bathed by patient: Chest, Abdomen, Left arm, Right arm, Left upper leg, Front perineal area, Buttocks, Right upper leg Body parts bathed by helper: Right lower leg, Left lower leg, Back  Bathing assist Assist Level: Touching or steadying assistance(Pt > 75%)      Upper Body Dressing/Undressing Upper body dressing   What is the patient wearing?: Pull over shirt/dress     Pull over shirt/dress - Perfomed by patient: Thread/unthread right sleeve, Thread/unthread left sleeve, Put head through opening, Pull shirt over trunk Pull over shirt/dress - Perfomed by helper: Pull shirt over trunk        Upper body assist Assist Level: Supervision or verbal cues, Set up   Set up : To obtain clothing/put away  Lower Body Dressing/Undressing Lower body dressing   What is the patient wearing?: Pants, Socks, Shoes, Underwear Underwear - Performed by patient: Thread/unthread right underwear leg, Thread/unthread left underwear leg, Pull underwear up/down   Pants- Performed by patient: Thread/unthread left pants leg Pants- Performed by helper: Thread/unthread right pants leg, Pull pants up/down Non-skid slipper socks- Performed by patient: Don/doff left sock Non-skid slipper socks- Performed by helper: Don/doff right sock   Socks - Performed by helper: Don/doff right sock, Don/doff left sock   Shoes - Performed by helper: Don/doff right shoe, Don/doff left shoe  Lower body assist Assist for lower body dressing:  (mod A LB)      Toileting Toileting Toileting activity did not occur: No continent bowel/bladder event   Toileting steps completed by helper: Adjust clothing prior to toileting, Performs perineal hygiene, Adjust clothing after  toileting    Toileting assist Assist level:  (total A)   Transfers Chair/bed transfer Chair/bed transfer activity did not occur: Safety/medical concerns Chair/bed transfer method: Ambulatory Chair/bed transfer assist level: Moderate assist (Pt 50 - 74%/lift or lower) Chair/bed transfer assistive device: Armrests     Locomotion Ambulation Ambulation activity did not occur: Safety/medical concerns   Max distance:  (15 ft) Assist level: Moderate assist (Pt 50 - 74%)   Wheelchair Wheelchair activity did not occur: Safety/medical concerns   Max wheelchair distance: 20' Assist Level: Maximal assistance (Pt 25 - 49%) (BLE)  Cognition Comprehension Comprehension assist level: Understands basic 25 - 49% of the time/ requires cueing 50 - 75% of the time  Expression Expression assist level: Expresses basic 25 - 49% of the time/requires cueing 50 - 75% of the time. Uses single words/gestures.  Social Interaction Social Interaction assist level: Interacts appropriately 25 - 49% of time - Needs frequent redirection.  Problem Solving Problem solving assist level: Solves basic 25 - 49% of the time - needs direction more than half the time to initiate, plan or complete simple activities  Memory Memory assist level: Recognizes or recalls 25 - 49% of the time/requires cueing 50 - 75% of the time   Medical Problem List and Plan: 1.  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  DVT Prophylaxis/Anticoagulation: Mechanical: Sequential compression devices, below knee Bilateral lower extremities 3. Pain Management: Hydrocodone prn. Monitor for sedation.  4. Mood: [name] to follow for evaluation and support as mentation improves.  5. Neuropsych: This patient is not capable of making decisions on his own behalf.  -  ritalin for attention/arousal-- 10mg  6. Skin/Wound Care: routine skin care 7.  Fluids/Electrolytes/Nutrition: on clears  -continue TF as well 8. Right displaced sacral and right pubic rami fracture: WBAT 9. Right clavicle fracture: NWB --to keep arm below 90 degrees and sling when out of bed. 10. Acute urinary retention: Monitor voiding with PVR checks.  toilet patient every 4 hours.   -ua neg, ucx neg also  -urecholine trial. Improving continence 11. MSSA RLL PNA:  . Rocephin complete    -afebrile currently. No clinical signs of active disease  -pt on clear liquids now  -recheck cbc on monday 12. Pre-renal azotemia: changed tube feed to Nephro. Increased water flushes to qid.   - BUN trending down to 23 on 5/31---recheck monday  -clears per SLP 13. HTN: On metoprolol bid.  Fair control at present    LOS (Days) 11 A FACE TO FACE EVALUATION WAS PERFORMED  Ranelle Oyster, MD 07/13/2016 10:43 AM

## 2016-07-13 NOTE — Progress Notes (Signed)
Speech Language Pathology Daily Session Note  Patient Details  Name: Tyler AskewDavid Stotz MRN: [id number] Date of Birth: 09/23/1950  Today's Date: 07/13/2016 SLP Individual Time: 0730-0830 SLP Individual Time Calculation (min): 60 min  Short Term Goals: Week 2: SLP Short Term Goal 1 (Week 2): Patient will orient to place and situation with Max A verbal and visual cues.  SLP Short Term Goal 2 (Week 2): Patient will demonstrate sustained attention to a functional task for ~2 minutes with Max A multimodal cues.  SLP Short Term Goal 3 (Week 2): Patient will initiate functional tasks within 1 minute with Max A verbal cues . SLP Short Term Goal 4 (Week 2): Patient will follow commands in 75% of opportunities with Mod A verbal cues.  SLP Short Term Goal 5 (Week 2): Patient will consume trials of Dys. 1 textures with thin liquids without overt s/s of aspiration and Mod A verbal cues for swallow initiation in less than 20% of opportunities over 2 sessions prior to upgrade.   Skilled Therapeutic Interventions: Skilled treatment session focused on cognitive and dysphagia goals. Upon arrival, patient was asleep and required Max multimodal cues for arousal. SLP facilitated session by providing total A to donn shorts and Max-Total A to come to EOB due to lethargy but was eventually transferred to the wheelchair. Patient required total A for orientation and initiation with breakfast meal of thin liquids. Patient consumed trials of thin liquids via cup without overt s/s of aspiration and required Min verbal cues for use of small sips. Patient declined trials of solids despite encouragement.  Patient left upright in wheelchair at RN station. Continue with current plan of care.      Function:  Eating Eating   Modified Consistency Diet: Yes Eating Assist Level: More than reasonable amount of time;Supervision or verbal cues           Cognition Comprehension Comprehension assist level: Understands basic 25 - 49%  of the time/ requires cueing 50 - 75% of the time  Expression   Expression assist level: Expresses basic 25 - 49% of the time/requires cueing 50 - 75% of the time. Uses single words/gestures.  Social Interaction Social Interaction assist level: Interacts appropriately 25 - 49% of time - Needs frequent redirection.  Problem Solving Problem solving assist level: Solves basic 25 - 49% of the time - needs direction more than half the time to initiate, plan or complete simple activities  Memory Memory assist level: Recognizes or recalls 25 - 49% of the time/requires cueing 50 - 75% of the time    Pain No/Denies Pain   Therapy/Group: Individual Therapy  Inis Borneman 07/13/2016, 3:03 PM

## 2016-07-13 NOTE — Progress Notes (Signed)
Physical Therapy Session Note  Patient Details  Name: Tyler AskewDavid Lane MRN: [id number] Date of Birth: 1951/02/08  Today's Date: 07/13/2016 PT Individual Time: [id number] and 1446-1530 PT Individual Time Calculation (min): 40 min and 44 min   Short Term Goals: Week 2:  PT Short Term Goal 1 (Week 2): Pt will ambulate consistently 50 ft with +1 assist. PT Short Term Goal 2 (Week 2): Pt will initate stair training to prepare for home access.  Skilled Therapeutic Interventions/Progress Updates:  Treatment 1: Pt received in w/c at nurses station. Session focused on gait training for NMR, activity tolerance, and cognitive remediation. In gym pt ambulated 40 ft x 2 with LUE supported around therapist's shoulders and min assist overall. Therapist provided manual facilitation for weight shifting R as pt limited by pain with weight bearing on RLE (rest breaks provided PRN). Pt also requires encouragement to increase gait distance & significant rest break after each trial. Pt engaged in recalling names of plastic animals with pt correctly identifying 4/7 with max verbal cuing & clues. Pt frequently falling asleep during task despite max encouragement for engagement. Pt assisted back to room & ambulated w/c>bed in manner noted above. Pt required max assist to transfer sit>supine as pt with poor initiation & awareness during task. At end of session pt left in bed with all needs within reach & bed alarm set, pt resting with eyes closed.  During session pt engaged in conversation but unable to independently recall correct age, location from choice of two, and only able to recall name of 1 child. Therapist passively educated pt on current location/situation throughout session. Therapist donned pt's RUE sling total assist & pt does well at maintaining NWB RUE but if sling is not donned pt frequently pushes through extremity despite max education.   Treatment 2: Pt received in w/c at nurses station. Session focused on  stair negotiation, activity tolerance, and cognitive remediation. Pt ambulated 10 ft + 10 ft in gym with min assist and LUE supported on therapist's shoulder. Pt negotiated 2 steps (3") with L rail and mod assist with max cuing to attend to task and encouragement for continued participation. Pt unable to ascend all 8 steps 2/2 fatigue. While sitting in w/c pt engaged in various cognitive tasks (sorting plastic bugs by color, placing items in container, selecting correct bean bag from choice of 2) with max encouragement to participate and max cuing for accuracy/correctness. Pt only able to attend to task for ~10 seconds at a time before fatiguing and resting in w/c. Throughout session therapist oriented pt on situation, location, etc. By the end of the session pt was able to correctly select hospital when given choice of 2 locations. At end of session pt left sitting in w/c at nurses station with QRB donned.   Therapy Documentation Precautions:  Precautions Precautions: Fall Precaution Comments: R shoulder sling Other Brace/Splint: has cortrax feeding tube Restrictions Weight Bearing Restrictions: Yes RUE Weight Bearing: Non weight bearing RLE Weight Bearing: Weight bearing as tolerated LLE Weight Bearing: Weight bearing as tolerated   General: PT Amount of Missed Time (min): 20 Minutes PT Missed Treatment Reason: Patient fatigue    See Function Navigator for Current Functional Status.   Therapy/Group: Individual Therapy  Sandi MariscalVictoria M Charee Tumblin 07/13/2016, 3:38 PM

## 2016-07-13 NOTE — Progress Notes (Signed)
Occupational Therapy Session Note  Patient Details  Name: Tyler AskewDavid Lane MRN: [id number] Date of Birth: 1950/09/19  Today's Date: 07/13/2016 OT Individual Time: [id number] OT Individual Time Calculation (min): 65 min    Short Term Goals: Week 2:  OT Short Term Goal 1 (Week 2): Pt will perform toilet transfer with min Lane. OT Short Term Goal 2 (Week 2): Pt will perform bathing at shower level with mod Lane and mod multimodal cues for routine task.   Skilled Therapeutic Interventions/Progress Updates:    Tx focus on ADL retraining at shower level, cognitive remediation, balance, and functional transfers  Pt greeted at RN station, agreeable to shower. Pt escorted back to room by OT. Pt transferred to TTB with Min Lane, OT elevating right arm (under 90 degrees shoulder flexion) for NWB during stand pivot. Pt bathed with overall Min Lane, tactile/instructional cues for step by step sequencing and thoroughness. Pt initiating washing right leg and periarea. Pt initially perseverating on washing face. OT controlled hand held shower to keep feeding tube dry. Afterwards pt returned to w/c and proceeded to dress at sink. Pt able correctly orient shirt! He required overall Min Lane for right sock only. Ideational apraxia when pt was handed comb. Pt looked from comb to therapist with brow furrowed. Pt requiring instructional cues to use appropriately (tried to brush his hand at first). Pt was then escorted to RN station. Pt reported liking the band [place]. Pt reclined in TIS with safety belt donned and [place] playing on therapy ipad at time of departure.    Therapy Documentation Precautions:  Precautions Precautions: Fall Precaution Comments: R shoulder sling Other Brace/Splint: has cortrax feeding tube Restrictions Weight Bearing Restrictions: Yes RUE Weight Bearing: Non weight bearing RLE Weight Bearing: Weight bearing as tolerated LLE Weight Bearing: Weight bearing as tolerated  Pain: No c/o or expressions  of pain during tx    ADL:      See Function Navigator for Current Functional Status.   Therapy/Group: Individual Therapy  Tyler Lane Tyler Lane 07/13/2016, 12:02 PM

## 2016-07-13 NOTE — Progress Notes (Signed)
Occupational Therapy Session Note  Patient Details  Name: Tyler Lane MRN: [id number] Date of Birth: Jan 01, 1951  Today's Date: 07/13/2016 OT Individual Time: 1400-1430 OT Individual Time Calculation (min): 30 min    Short Term Goals: Week 1:  OT Short Term Goal 1 (Week 1): Pt will perform UB dressing with mod A to decrease level of assistance with self care.  OT Short Term Goal 1 - Progress (Week 1): Met OT Short Term Goal 2 (Week 1): Pt will sit EOB to wash UB with min A for dynamic sitting balance. OT Short Term Goal 2 - Progress (Week 1): Met OT Short Term Goal 3 (Week 1): Pt will stand with mod A stand balance for LB clothing management. OT Short Term Goal 3 - Progress (Week 1): Met Week 2:  OT Short Term Goal 1 (Week 2): Pt will perform toilet transfer with min A. OT Short Term Goal 2 (Week 2): Pt will perform bathing at shower level with mod A and mod multimodal cues for routine task.   Skilled Therapeutic Interventions/Progress Updates: 1:1 Pt in bed when arrived. Focus treatment session on initiation, following one step commands, orientation, sustained attention, etc. Pt came to EOB with mod A. Focus on donning socks and shoes with max A due to requiring max A to attend to task to be able to complete it. Pt Transferred stand pivot with min A with more than reasonable time to transfer into the w/c. Pt unable to verbalize the answers to orientation questions due to decr initiation and sustained attention. Engaged in opening and reading a card that came in the mail. Pt  Required multimodal cuing to follow through in task. Pt able to read the card but with decreased recall of family members. Pt attempted to participate in laundry task but unable to follow one step commands. Pt left at the RN station in prep for next therapy.      Therapy Documentation Precautions:  Precautions Precautions: Fall Precaution Comments: R shoulder sling Other Brace/Splint: has cortrax feeding  tube Restrictions Weight Bearing Restrictions: Yes RUE Weight Bearing: Non weight bearing RLE Weight Bearing: Weight bearing as tolerated LLE Weight Bearing: Weight bearing as tolerated  Pain: o c/o pain     See Function Navigator for Current Functional Status.   Therapy/Group: Individual Therapy  Willeen Cass [facility] 07/13/2016, 3:28 PM

## 2016-07-14 ENCOUNTER — Inpatient Hospital Stay (HOSPITAL_COMMUNITY): Payer: Medicare HMO | Admitting: *Deleted

## 2016-07-14 ENCOUNTER — Inpatient Hospital Stay (HOSPITAL_COMMUNITY): Payer: Medicare HMO | Admitting: Speech Pathology

## 2016-07-14 LAB — GLUCOSE, CAPILLARY
GLUCOSE-CAPILLARY: 120 mg/dL — AB (ref 65–99)
GLUCOSE-CAPILLARY: 111 mg/dL — AB (ref 65–99)
Glucose-Capillary: 107 mg/dL — ABNORMAL HIGH (ref 65–99)
Glucose-Capillary: 114 mg/dL — ABNORMAL HIGH (ref 65–99)
Glucose-Capillary: 109 mg/dL — ABNORMAL HIGH (ref 65–99)

## 2016-07-14 NOTE — Progress Notes (Signed)
[facility] PHYSICAL MEDICINE & REHABILITATION     PROGRESS NOTE    Subjective/Complaints: Sitting up in bed. No problems overnight.   ROS: Limited due to cognitive/behavioral     Objective: Vital Signs: Blood pressure 117/82, pulse 72, temperature 98.8 F (37.1 C), temperature source Oral, resp. rate 18, weight 71.1 kg (156 lb 12.8 oz), SpO2 96 %. No results found. No results for input(s): WBC, HGB, HCT, PLT in the last 72 hours.  Recent Labs  07/12/16 0608  NA 136  K 3.8  CL 100*  GLUCOSE 94  BUN 23*  CREATININE 0.86  CALCIUM 9.0   CBG (last 3)   Recent Labs  07/14/16 0000 07/14/16 0401 07/14/16 0811  GLUCAP 126* 107* 114*    Wt Readings from Last 3 Encounters:  07/14/16 71.1 kg (156 lb 12.8 oz)  07/02/16 74.3 kg (163 lb 12.8 oz)    Physical Exam:  Constitutional: He appears well-developed and well-nourished. No distress    HENT:  Head: Normocephalic.  Right Ear: External ear normal.  Left Ear: External ear normal.  Mouth/Throat: Oropharynx is clear and moist.  Eyes: Conjunctivae and EOM are normal. Pupils are equal, round, and reactive to light. Right eye exhibits no discharge. Left eye exhibits no discharge.  Neck: supple  Cardiovascular: RRR Respiratory:  CTA B GI: Soft. Bowel sounds are normal. He exhibits no distension. There is no tenderness.  Musculoskeletal: right shoulder tender, in sling Neurological: He is fairly alert.  Oriented to person. Language of confusion   present. Follows all commands. Limited insight and awareness.  Motor: moves all 4.  A&Ox1 only.  HOH  Skin: Skin is warm and dry.  Psychiatric: cooperative   Assessment/Plan: 1. 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 Physiatrist is providing close team supervision and 24 hour management of active medical problems listed below. Physiatrist and rehab team continue  to assess barriers to discharge/monitor patient progress toward functional and medical goals.  Function:  Bathing Bathing position Bathing activity did not occur: Refused Position: Shower  Bathing parts Body parts bathed by patient: Chest, Abdomen, Left arm, Right arm, Left upper leg, Front perineal area, Buttocks, Right upper leg, Left lower leg, Right lower leg Body parts bathed by helper: Back  Bathing assist Assist Level: Touching or steadying assistance(Pt > 75%)      Upper Body Dressing/Undressing Upper body dressing   What is the patient wearing?: Pull over shirt/dress     Pull over shirt/dress - Perfomed by patient: Thread/unthread right sleeve, Thread/unthread left sleeve, Put head through opening, Pull shirt over trunk Pull over shirt/dress - Perfomed by helper: Pull shirt over trunk        Upper body assist Assist Level: Supervision or verbal cues, Set up   Set up : To obtain clothing/put away  Lower Body Dressing/Undressing Lower body dressing   What is the patient wearing?: Pants, Underwear, Non-skid slipper socks Underwear - Performed by patient: Thread/unthread right underwear leg, Thread/unthread left underwear leg, Pull underwear up/down   Pants- Performed by patient: Thread/unthread right pants leg, Thread/unthread left pants leg, Pull pants up/down Pants- Performed by helper: Thread/unthread right pants leg, Pull pants up/down Non-skid slipper socks- Performed by patient: Don/doff left sock Non-skid slipper socks- Performed by helper: Don/doff right sock   Socks - Performed by helper: Don/doff right sock, Don/doff left sock   Shoes - Performed by helper: Don/doff right shoe, Don/doff  left shoe          Lower body assist Assist for lower body dressing: Touching or steadying assistance (Pt > 75%)      Toileting Toileting Toileting activity did not occur: No continent bowel/bladder event   Toileting steps completed by helper: Adjust clothing prior to  toileting, Performs perineal hygiene, Adjust clothing after toileting    Toileting assist Assist level:  (total A)   Transfers Chair/bed transfer Chair/bed transfer activity did not occur: Safety/medical concerns Chair/bed transfer method: Ambulatory Chair/bed transfer assist level: Touching or steadying assistance (Pt > 75%) Chair/bed transfer assistive device: Armrests     Locomotion Ambulation Ambulation activity did not occur: Safety/medical concerns   Max distance: 10 ft Assist level: Touching or steadying assistance (Pt > 75%)   Wheelchair Wheelchair activity did not occur: Safety/medical concerns   Max wheelchair distance: 20' Assist Level: Maximal assistance (Pt 25 - 49%) (BLE)  Cognition Comprehension Comprehension assist level: Understands basic 25 - 49% of the time/ requires cueing 50 - 75% of the time  Expression Expression assist level: Expresses basic 25 - 49% of the time/requires cueing 50 - 75% of the time. Uses single words/gestures.  Social Interaction Social Interaction assist level: Interacts appropriately 25 - 49% of time - Needs frequent redirection.  Problem Solving Problem solving assist level: Solves basic 25 - 49% of the time - needs direction more than half the time to initiate, plan or complete simple activities  Memory Memory assist level: Recognizes or recalls 25 - 49% of the time/requires cueing 50 - 75% of the time   Medical Problem List and Plan: 1.  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  DVT Prophylaxis/Anticoagulation: Mechanical: Sequential compression devices, below knee Bilateral lower extremities 3. Pain Management: Hydrocodone prn. Monitor for sedation.  4. Mood: [name] to follow for evaluation and support as mentation improves.  5. Neuropsych: This patient is not capable of making decisions on his own behalf.  -  ritalin for  attention/arousal/initiation-- 10mg  6. Skin/Wound Care: routine skin care 7. Fluids/Electrolytes/Nutrition: on clears  -continue TF as well 8. Right displaced sacral and right pubic rami fracture: WBAT 9. Right clavicle fracture: NWB --to keep arm below 90 degrees and sling when out of bed. 10. Acute urinary retention: Monitor voiding with PVR checks.  toilet patient every 4 hours.   -ua neg, ucx neg also  -urecholine trial. Improving continence a whole 11. MSSA RLL PNA:  . Rocephin complete    -afebrile currently. No clinical signs of active disease  -pt on clear liquids now  -recheck cbc on monday 12. Pre-renal azotemia: changed tube feed to Nephro. Increased water flushes to qid.   - BUN trending down to 23 on 5/31---recheck monday  -clears per SLP 13. HTN: On metoprolol bid.  Fair control at present    LOS (Days) 12 A FACE TO FACE EVALUATION WAS PERFORMED  Faith RogueSWARTZ,Mailani Degroote T, MD 07/14/2016 9:30 AM

## 2016-07-14 NOTE — Progress Notes (Signed)
Physical Therapy Session Note  Patient Details  Name: Tyler Lane MRN: [id number] Date of Birth: 01-Aug-1950  Today's Date: 07/14/2016 PT Individual Time: 1300-1330 PT Individual Time Calculation (min): 30 min   Short Term Goals: Week 1:  PT Short Term Goal 1 (Week 1): Pt to perform supine<>sitting with mod assist.  PT Short Term Goal 1 - Progress (Week 1): Met PT Short Term Goal 2 (Week 1): Pt to perform sit<>stand with max assist and LRAD PT Short Term Goal 2 - Progress (Week 1): Met PT Short Term Goal 3 (Week 1): Pt to ambulate 10 ft with assistance and LRAD.  PT Short Term Goal 3 - Progress (Week 1): Met PT Short Term Goal 4 (Week 1): Pt to sit X 8 min with supervision for balance and trunk control. PT Short Term Goal 4 - Progress (Week 1): Met  Skilled Therapeutic Interventions/Progress Updates: Tx focused on NMR, functional ambulation training and functional mobility. Pt sleeping in bed, wife present.  Supine>sit with Mod A and multi-modal cues. Pt sat unsupported with close S and postural cues.  Stand-step transfer x2 with Mod A for steadying. Sit<.stands with Min A and tactile cues for posture.  Gait on carpet x25' with R HHA and manual facilitation for weight shifting and initiation. Gait marked by decreased step length and foot clearance. Pt given target to encourage distance.  Pt stood x3 min at sink to clean/change briefs with min-guard A.  Handoff to NT for lunch.  Continue per PT POC.      Therapy Documentation Precautions:  Precautions Precautions: Fall Precaution Comments: R shoulder sling Other Brace/Splint: has cortrax feeding tube Restrictions Weight Bearing Restrictions: Yes RUE Weight Bearing: Non weight bearing RLE Weight Bearing: Weight bearing as tolerated LLE Weight Bearing: Weight bearing as tolerated General:   Vital Signs:  Pain:   Mobility:   Locomotion :    Trunk/Postural Assessment :    Balance:   Exercises:   Other Treatments:      See Function Navigator for Current Functional Status.   Therapy/Group: Individual Therapy  Kennieth Rad, PT, DPT   Dwaine Deter, [place] 07/14/2016, 3:02 PM

## 2016-07-15 ENCOUNTER — Inpatient Hospital Stay (HOSPITAL_COMMUNITY): Payer: Medicare HMO | Admitting: Occupational Therapy

## 2016-07-15 LAB — GLUCOSE, CAPILLARY
GLUCOSE-CAPILLARY: 120 mg/dL — AB (ref 65–99)
GLUCOSE-CAPILLARY: 126 mg/dL — AB (ref 65–99)
Glucose-Capillary: 108 mg/dL — ABNORMAL HIGH (ref 65–99)
Glucose-Capillary: 109 mg/dL — ABNORMAL HIGH (ref 65–99)
Glucose-Capillary: 137 mg/dL — ABNORMAL HIGH (ref 65–99)
Glucose-Capillary: 93 mg/dL (ref 65–99)

## 2016-07-15 NOTE — Progress Notes (Signed)
Occupational Therapy Session Note  Patient Details  Name: Tyler AskewDavid Lane MRN: [id number] Date of Birth: 11-08-1950  Today's Date: 07/15/2016 OT Individual Time: [id number] OT Individual Time Calculation (min): 49 min    Short Term Goals: Week 2:  OT Short Term Goal 1 (Week 2): Pt will perform toilet transfer with min A. OT Short Term Goal 2 (Week 2): Pt will perform bathing at shower level with mod A and mod multimodal cues for routine task.   Skilled Therapeutic Interventions/Progress Updates:    Tx focus on cognitive remediation and balance during self care tasks.   Pt greeted supine in bed. Pt transitioned to EOB with Mod- Max A and extra time. Pt selected shirt/pants when given 2 options. Pt completed UB/LB dressing including donning socks/shoes with steady assist for sit<stand (OT held R UE forward in extension, under 90 degrees, for precaution adherence during sit<stand transitions). Pt provided tactile and 1 step verbal cues to maximize independence. Afterwards had pt attempt to void. Pt completed stand pivot transfer (OT elevating R UE) to toilet with Mod A. Pt urinating within 30 seconds of transfer. Afterwards he transferred back to w/c. Pt reclined in TIS with safety belt donned. RN wanted pt up in w/c and was monitoring him from her desk station outside of room.  Therapy ipad used to play [employer] songs. Pt recommending "[employer]" when asked during end of tx. RN agreed to watch/return device to therapy office later in day. Pt bobbing his head to music at time of departure.   Therapy Documentation Precautions:  Precautions Precautions: Fall Precaution Comments: R shoulder sling Other Brace/Splint: has cortrax feeding tube Restrictions Weight Bearing Restrictions: Yes RUE Weight Bearing: Non weight bearing RLE Weight Bearing: Weight bearing as tolerated LLE Weight Bearing: Weight bearing as tolerated   Vital Signs: Therapy Vitals Pulse Rate: 91 BP: 121/87 Pain: No  c/o or expressions of pain during tx  Pain Assessment Pain Assessment: No/denies pain Pain Intervention(s): Music (with OT [phone]; with NSG 1015-1100) ADL:      See Function Navigator for Current Functional Status.   Therapy/Group: Individual Therapy  [name] A [name] 07/15/2016, 12:38 PM

## 2016-07-15 NOTE — Progress Notes (Signed)
Patient found to be sweating, soaked through gown and linens. Vitals taken, all within normal. Changed patient and linens. Patient stated he returned to feeling normal, able to eat normal meal. Possibly related to exertion over incontinent bowel earlier/direct sunlight on bed. Will continue to monitor.

## 2016-07-15 NOTE — Progress Notes (Signed)
[facility] PHYSICAL MEDICINE & REHABILITATION     PROGRESS NOTE    Subjective/Complaints: No new problems. A little restless  ROS: Limited due to cognitive/behavioral     Objective: Vital Signs: Blood pressure 138/82, pulse 68, temperature 98.2 F (36.8 C), temperature source Oral, resp. rate 18, weight 72.9 kg (160 lb 11.2 oz), SpO2 99 %. No results found. No results for input(s): WBC, HGB, HCT, PLT in the last 72 hours. No results for input(s): NA, K, CL, GLUCOSE, BUN, CREATININE, CALCIUM in the last 72 hours.  Invalid input(s): CO CBG (last 3)   Recent Labs  07/15/16 0011 07/15/16 0439 07/15/16 0900  GLUCAP 93 109* 137*    Wt Readings from Last 3 Encounters:  07/15/16 72.9 kg (160 lb 11.2 oz)  07/02/16 74.3 kg (163 lb 12.8 oz)    Physical Exam:  Constitutional: He appears well-developed and well-nourished. No distress    HENT:  Head: Normocephalic.  Right Ear: External ear normal.  Left Ear: External ear normal.  Mouth/Throat: Oropharynx is clear and moist.  Eyes: Conjunctivae and EOM are normal. Pupils are equal, round, and reactive to light. Right eye exhibits no discharge. Left eye exhibits no discharge.  Neck: supple  Cardiovascular: RRR Respiratory:  CTA B GI: Soft. Bowel sounds are normal. He exhibits no distension. There is no tenderness.  Musculoskeletal: right shoulder tender. Neurological: He is fairly alert.  Oriented to person only.   present. Follows all commands. Limited insight and awareness.  Motor: moves all 4.  A&Ox1 only.  HOH  Skin: Skin is warm and dry.  Psychiatric: cooperative   Assessment/Plan: 1. 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 Physiatrist is providing close team supervision and 24 hour management of active medical problems listed below. Physiatrist and rehab team continue to assess barriers to  discharge/monitor patient progress toward functional and medical goals.  Function:  Bathing Bathing position Bathing activity did not occur: Refused Position: Shower  Bathing parts Body parts bathed by patient: Chest, Abdomen, Left arm, Right arm, Left upper leg, Front perineal area, Buttocks, Right upper leg, Left lower leg, Right lower leg Body parts bathed by helper: Back  Bathing assist Assist Level: Touching or steadying assistance(Pt > 75%)      Upper Body Dressing/Undressing Upper body dressing   What is the patient wearing?: Pull over shirt/dress     Pull over shirt/dress - Perfomed by patient: Thread/unthread right sleeve, Thread/unthread left sleeve, Put head through opening, Pull shirt over trunk Pull over shirt/dress - Perfomed by helper: Pull shirt over trunk        Upper body assist Assist Level: Supervision or verbal cues, Set up   Set up : To obtain clothing/put away  Lower Body Dressing/Undressing Lower body dressing   What is the patient wearing?: Pants, Underwear, Non-skid slipper socks Underwear - Performed by patient: Thread/unthread right underwear leg, Thread/unthread left underwear leg, Pull underwear up/down   Pants- Performed by patient: Thread/unthread right pants leg, Thread/unthread left pants leg, Pull pants up/down Pants- Performed by helper: Thread/unthread right pants leg, Pull pants up/down Non-skid slipper socks- Performed by patient: Don/doff left sock Non-skid slipper socks- Performed by helper: Don/doff right sock   Socks - Performed by helper: Don/doff right sock, Don/doff left sock   Shoes - Performed by helper: Don/doff right shoe, Don/doff left shoe          Lower body assist Assist  for lower body dressing: Touching or steadying assistance (Pt > 75%)      Toileting Toileting Toileting activity did not occur: No continent bowel/bladder event   Toileting steps completed by helper: Adjust clothing prior to toileting, Performs  perineal hygiene, Adjust clothing after toileting    Toileting assist Assist level:  (total A)   Transfers Chair/bed transfer Chair/bed transfer activity did not occur: Safety/medical concerns Chair/bed transfer method: Stand pivot Chair/bed transfer assist level: Moderate assist (Pt 50 - 74%/lift or lower) Chair/bed transfer assistive device: Armrests     Locomotion Ambulation Ambulation activity did not occur: Safety/medical concerns   Max distance: 25 Assist level: Touching or steadying assistance (Pt > 75%)   Wheelchair Wheelchair activity did not occur: Safety/medical concerns   Max wheelchair distance: 20' Assist Level: Maximal assistance (Pt 25 - 49%) (BLE)  Cognition Comprehension Comprehension assist level: Understands basic 25 - 49% of the time/ requires cueing 50 - 75% of the time  Expression Expression assist level: Expresses basic 25 - 49% of the time/requires cueing 50 - 75% of the time. Uses single words/gestures.  Social Interaction Social Interaction assist level: Interacts appropriately 25 - 49% of time - Needs frequent redirection.  Problem Solving Problem solving assist level: Solves basic 25 - 49% of the time - needs direction more than half the time to initiate, plan or complete simple activities  Memory Memory assist level: Recognizes or recalls 25 - 49% of the time/requires cueing 50 - 75% of the time   Medical Problem List and Plan: 1.  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  DVT Prophylaxis/Anticoagulation: Mechanical: Sequential compression devices, below knee Bilateral lower extremities 3. Pain Management: Hydrocodone prn. Monitor for sedation.  4. Mood: [name] to follow for evaluation and support as mentation improves.  5. Neuropsych: This patient is not capable of making decisions on his own behalf.  -  ritalin for  attention/arousal/initiation-- 10mg  6. Skin/Wound Care: routine skin care 7. Fluids/Electrolytes/Nutrition: on clears  -continue TF as well 8. Right displaced sacral and right pubic rami fracture: WBAT 9. Right clavicle fracture: NWB --to keep arm below 90 degrees and sling when out of bed. 10. Acute urinary retention: Monitor voiding with PVR checks.  toilet patient every 4 hours.   -ua neg, ucx neg also  -urecholine trial. Improving continence a whole 11. MSSA RLL PNA:  . Rocephin complete    -afebrile currently. No clinical signs of active disease  -pt on clear liquids now  -recheck cbc on monday 12. Pre-renal azotemia: changed tube feed to Nephro. Increased water flushes to qid.   - BUN trending down to 23 on 5/31---recheck monday  -clears per SLP 13. HTN: On metoprolol bid.  Fair control at present    LOS (Days) 13 A FACE TO FACE EVALUATION WAS PERFORMED  Ranelle OysterSWARTZ,ZACHARY T, MD 07/15/2016 9:39 AM

## 2016-07-15 NOTE — Progress Notes (Signed)
Patient able to tolerate sitting up in room between 0900 therapy and lunch. Trial keeping him in room with NT and RN nearby.

## 2016-07-16 ENCOUNTER — Inpatient Hospital Stay (HOSPITAL_COMMUNITY): Payer: Medicare HMO | Admitting: Occupational Therapy

## 2016-07-16 ENCOUNTER — Inpatient Hospital Stay (HOSPITAL_COMMUNITY): Payer: Medicare HMO | Admitting: Speech Pathology

## 2016-07-16 ENCOUNTER — Inpatient Hospital Stay (HOSPITAL_COMMUNITY): Payer: Medicare HMO | Admitting: Physical Therapy

## 2016-07-16 LAB — GLUCOSE, CAPILLARY
GLUCOSE-CAPILLARY: 103 mg/dL — AB (ref 65–99)
GLUCOSE-CAPILLARY: 115 mg/dL — AB (ref 65–99)
GLUCOSE-CAPILLARY: 116 mg/dL — AB (ref 65–99)
Glucose-Capillary: 102 mg/dL — ABNORMAL HIGH (ref 65–99)
Glucose-Capillary: 116 mg/dL — ABNORMAL HIGH (ref 65–99)
Glucose-Capillary: 121 mg/dL — ABNORMAL HIGH (ref 65–99)
Glucose-Capillary: 132 mg/dL — ABNORMAL HIGH (ref 65–99)

## 2016-07-16 LAB — CBC
HCT: 48.6 % (ref 39.0–52.0)
HEMOGLOBIN: 16.9 g/dL (ref 13.0–17.0)
MCH: 32.4 pg (ref 26.0–34.0)
MCHC: 34.8 g/dL (ref 30.0–36.0)
MCV: 93.3 fL (ref 78.0–100.0)
Platelets: 216 10*3/uL (ref 150–400)
RBC: 5.21 MIL/uL (ref 4.22–5.81)
RDW: 14.5 % (ref 11.5–15.5)
WBC: 8.6 10*3/uL (ref 4.0–10.5)

## 2016-07-16 LAB — BASIC METABOLIC PANEL
Anion gap: 8 (ref 5–15)
BUN: 20 mg/dL (ref 6–20)
CHLORIDE: 103 mmol/L (ref 101–111)
CO2: 24 mmol/L (ref 22–32)
Calcium: 8.8 mg/dL — ABNORMAL LOW (ref 8.9–10.3)
Creatinine, Ser: 0.69 mg/dL (ref 0.61–1.24)
GFR calc non Af Amer: >60 mL/min (ref >60)
Glucose, Bld: 146 mg/dL — ABNORMAL HIGH (ref 65–99)
Potassium: 3.1 mmol/L — ABNORMAL LOW (ref 3.5–5.1)
SODIUM: 135 mmol/L (ref 135–145)

## 2016-07-16 MED ORDER — POTASSIUM CHLORIDE 20 MEQ/15ML (10%) PO SOLN
20.0000 meq | Freq: Three times a day (TID) | ORAL | Status: AC
  Administered 2016-07-16 – 2016-07-18 (×6): 20 meq
  Filled 2016-07-16 (×11): qty 15

## 2016-07-16 MED ORDER — JEVITY 1.2 CAL PO LIQD
1000.0000 mL | ORAL | Status: DC
  Administered 2016-07-16: 1000 mL
  Filled 2016-07-16: qty 237
  Filled 2016-07-16: qty 1000

## 2016-07-16 NOTE — Progress Notes (Signed)
Physical Therapy Weekly Progress Note  Patient Details  Name: Tyler Lane MRN: [id number] Date of Birth: 1950/10/16  Beginning of progress report period: Jul 10, 2016 End of progress report period: July 17, 2016  Today's Date: 07/17/2016   Patient has met 1 of 2 short term goals.  Pt is making fair progress towards LTG's. Pt continues to be limited by impaired cognition, as he is only consistently oriented to himself. Pt is also limited by decreased ability to weight bear through RLE. Pt has made progress this reporting period as he has been able to negotiate 12 steps with L rail and min assist. Pt would benefit from continued skilled PT treatment to focus on balance, endurance, strengthening, cognitive remediation, transfers, bed mobility, and for family education prior to d/c.   Patient continues to demonstrate the following deficits muscle weakness, decreased cardiorespiratory endurance, decreased coordination, decreased initiation, decreased attention, decreased awareness, decreased problem solving, decreased safety awareness, decreased memory and delayed processing, and decreased standing balance, decreased postural control, decreased balance strategies and difficulty maintaining precautions and therefore will continue to benefit from skilled PT intervention to increase functional independence with mobility.  Patient progressing toward long term goals..  Continue plan of care.  PT Short Term Goals Week 2:  PT Short Term Goal 1 (Week 2): Pt will ambulate consistently 50 ft with +1 assist. PT Short Term Goal 1 - Progress (Week 2): Not met PT Short Term Goal 2 (Week 2): Pt will initate stair training to prepare for home access. PT Short Term Goal 2 - Progress (Week 2): Met Week 3:  PT Short Term Goal 1 (Week 3): Pt will ambulate consistently 50 ft with min assist +1.  PT Short Term Goal 2 (Week 3): Pt will complete supine<>sit with min assist.    Therapy Documentation Precautions:   Precautions Precautions: Fall Precaution Comments: R shoulder sling Other Brace/Splint: has cortrax feeding tube Restrictions Weight Bearing Restrictions: Yes RUE Weight Bearing: Non weight bearing RLE Weight Bearing: Weight bearing as tolerated LLE Weight Bearing: Weight bearing as tolerated   See Function Navigator for Current Functional Status.  Therapy/Group: Individual Therapy  [name] 07/17/2016, 7:54 AM

## 2016-07-16 NOTE — Progress Notes (Signed)
Occupational Therapy Session Note  Patient Details  Name: Tyler AskewDavid Shadoan MRN: [id number] Date of Birth: 1950/06/20  Today's Date: 07/16/2016 OT Individual Time: 1102-1205 OT Individual Time Calculation (min): 63 min    Short Term Goals: Week 2:  OT Short Term Goal 1 (Week 2): Pt will perform toilet transfer with min A. OT Short Term Goal 2 (Week 2): Pt will perform bathing at shower level with mod A and mod multimodal cues for routine task.   Skilled Therapeutic Interventions/Progress Updates:    Tx focus on cognitive remediation, functional transfers, and standing balance during self care tasks.   Pt greeted at RN station, raising eyebrows/nodding head when asked if he wanted to shower. Pt escorted to room, selected clothing items (from 2 options). Pt then completed stand pivot to tub bench (R UE elevated below 90 degrees for precaution adherence). Pt bathed while seated, initiated bathing 50% of body areas with extra time! Mod verbal cues provided for attending to remainder of body areas, with pt requiring overall Min A. Pt then complete dressing w/c level at sink. Pt unable to initiate donning clothing after 3 minutes of clothing placed on sink in front of him. Pt able to complete dressing with items presented in lap one at a time. Mod cues for maintaining attention to terminate LB tasks that he had initiated (Donning pants and gripper socks). Sit<stand with steady assist for lifting pants over hips. Pt utilizing figure 4 position for donning gripper socks. At end of tx pt was returned to RN station. Santana playing on therapy ipad at time of departure. .   Therapy Documentation Precautions:  Precautions Precautions: Fall Precaution Comments: R shoulder sling Other Brace/Splint: has cortrax feeding tube Restrictions Weight Bearing Restrictions: Yes RUE Weight Bearing: Non weight bearing RLE Weight Bearing: Weight bearing as tolerated LLE Weight Bearing: Weight bearing as tolerated    Vital Signs: Therapy Vitals Pulse Rate: 91 BP: 117/86 Pain: No c/o or expressions of pain during tx.    ADL:      See Function Navigator for Current Functional Status.   Therapy/Group: Individual Therapy  Naseer Hearn A Rashmi Tallent 07/16/2016, 12:52 PM

## 2016-07-16 NOTE — Progress Notes (Signed)
[place] PHYSICAL MEDICINE & REHABILITATION     PROGRESS NOTE    Subjective/Complaints: Up in bed. No pain. Slept per RN  ROS: Limited due to cognitive/behavioral     Objective: Vital Signs: Blood pressure 114/78, pulse 92, temperature 97.7 F (36.5 C), temperature source Oral, resp. rate 18, weight 69 kg (152 lb 1.6 oz), SpO2 97 %. No results found.  Recent Labs  07/16/16 0655  WBC 8.6  HGB 16.9  HCT 48.6  PLT 216    Recent Labs  07/16/16 0655  NA 135  K 3.1*  CL 103  GLUCOSE 146*  BUN 20  CREATININE 0.69  CALCIUM 8.8*   CBG (last 3)   Recent Labs  07/16/16 0012 07/16/16 0446 07/16/16 0818  GLUCAP 116* 103* 102*    Wt Readings from Last 3 Encounters:  07/16/16 69 kg (152 lb 1.6 oz)  07/02/16 74.3 kg (163 lb 12.8 oz)    Physical Exam:  Constitutional: He appears well-developed and well-nourished. No distress    HENT:  Head: Normocephalic.  Right Ear: External ear normal.  Left Ear: External ear normal.  Mouth/Throat: Oropharynx is clear and moist.  Eyes: PERRL  Neck: supple  Cardiovascular: RRR Respiratory: CTA B GI: Soft. Bowel sounds are normal. He exhibits no distension. There is no tenderness.  Musculoskeletal: right shoulder tender. Neurological: He is alert.  Oriented to person only.     Follows basic commands. Limited insight and awareness.  Motor: moves all 4.  A&Ox1 only.  HOH  Skin: Skin is warm and dry.  Psychiatric: cooperative   Assessment/Plan: 1. 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 Physiatrist is providing close team supervision and 24 hour management of active medical problems listed below. Physiatrist and rehab team continue to assess barriers to discharge/monitor patient progress toward functional and medical goals.  Function:  Bathing Bathing position Bathing activity did not occur:  Refused Position: Shower  Bathing parts Body parts bathed by patient: Chest, Abdomen, Left arm, Right arm, Left upper leg, Front perineal area, Buttocks, Right upper leg, Left lower leg, Right lower leg Body parts bathed by helper: Back  Bathing assist Assist Level: Touching or steadying assistance(Pt > 75%)      Upper Body Dressing/Undressing Upper body dressing   What is the patient wearing?: Pull over shirt/dress     Pull over shirt/dress - Perfomed by patient: Thread/unthread right sleeve, Thread/unthread left sleeve, Put head through opening, Pull shirt over trunk Pull over shirt/dress - Perfomed by helper: Pull shirt over trunk        Upper body assist Assist Level: Supervision or verbal cues, Set up   Set up : To obtain clothing/put away  Lower Body Dressing/Undressing Lower body dressing   What is the patient wearing?: Pants, Socks, Shoes Underwear - Performed by patient: Thread/unthread right underwear leg, Thread/unthread left underwear leg, Pull underwear up/down   Pants- Performed by patient: Thread/unthread right pants leg, Thread/unthread left pants leg, Pull pants up/down Pants- Performed by helper: Thread/unthread right pants leg, Pull pants up/down Non-skid slipper socks- Performed by patient: Don/doff left sock Non-skid slipper socks- Performed by helper: Don/doff right sock Socks - Performed by patient: Don/doff right sock, Don/doff left sock Socks - Performed by helper: Don/doff right sock, Don/doff left sock Shoes - Performed by patient: Don/doff right shoe, Don/doff left shoe Shoes - Performed by helper: Don/doff right shoe, Don/doff left shoe  Lower body assist Assist for lower body dressing: Touching or steadying assistance (Pt > 75%)      Toileting Toileting Toileting activity did not occur: No continent bowel/bladder event   Toileting steps completed by helper: Adjust clothing prior to toileting, Performs perineal hygiene, Adjust clothing  after toileting    Toileting assist Assist level:  (total A)   Transfers Chair/bed transfer Chair/bed transfer activity did not occur: Safety/medical concerns Chair/bed transfer method: Stand pivot Chair/bed transfer assist level: Maximal assist (Pt 25 - 49%/lift and lower) Chair/bed transfer assistive device: Armrests     Locomotion Ambulation Ambulation activity did not occur: Safety/medical concerns   Max distance: 25 Assist level: Touching or steadying assistance (Pt > 75%)   Wheelchair Wheelchair activity did not occur: Safety/medical concerns   Max wheelchair distance: 20' Assist Level: Maximal assistance (Pt 25 - 49%) (BLE)  Cognition Comprehension Comprehension assist level: Understands basic 25 - 49% of the time/ requires cueing 50 - 75% of the time  Expression Expression assist level: Expresses basic 25 - 49% of the time/requires cueing 50 - 75% of the time. Uses single words/gestures.  Social Interaction Social Interaction assist level: Interacts appropriately 25 - 49% of time - Needs frequent redirection.  Problem Solving Problem solving assist level: Solves basic 25 - 49% of the time - needs direction more than half the time to initiate, plan or complete simple activities  Memory Memory assist level: Recognizes or recalls 25 - 49% of the time/requires cueing 50 - 75% of the time   Medical Problem List and Plan: 1.  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 Still confused 2.  DVT Prophylaxis/Anticoagulation: Mechanical: Sequential compression devices, below knee Bilateral lower extremities 3. Pain Management: Hydrocodone prn. Monitor for sedation.  4. Mood: [name] to follow for evaluation and support as mentation improves.  5. Neuropsych: This patient is not capable of making decisions on his own behalf.  -  ritalin for attention/arousal/initiation-- 10mg --has helped 6. Skin/Wound Care: routine  skin care 7. Fluids/Electrolytes/Nutrition: on clears  -continue TF as well 8. Right displaced sacral and right pubic rami fracture: WBAT 9. Right clavicle fracture: NWB --to keep arm below 90 degrees and sling when out of bed. 10. Acute urinary retention: Monitor voiding with PVR checks.  toilet patient every 4 hours.   -ua neg, ucx neg also  -urecholine trial. Improving continence a whole 11. MSSA RLL PNA:  . Rocephin complete    -afebrile currently. No clinical signs of active disease  -pt on clear liquids now   -wbc's normal 12. Pre-renal azotemia: changed tube feed to Nephro. Continue H2O flushes   - BUN trending down to 23 on 5/31---down to 20 today 6/4  -clears per SLP 13. HTN: On metoprolol bid.  Fair control at present    LOS (Days) 14 A FACE TO FACE EVALUATION WAS PERFORMED  Ranelle OysterSWARTZ,Darshay Deupree T, MD 07/16/2016 9:18 AM

## 2016-07-16 NOTE — Progress Notes (Signed)
Occupational Therapy Session Note  Patient Details  Name: Tyler AskewDavid Tait MRN: [id number] Date of Birth: 1951-02-09  Today's Date: 07/16/2016 OT Individual Time: 0700-0751 OT Individual Time Calculation (min): 51 min  and Today's Date: 07/16/2016 OT Missed Time: 24 Minutes Missed Time Reason: Patient fatigue   Skilled Therapeutic Interventions/Progress Updates:    Upon entering the room, pt supine in bed appearing to be restless and keeping eyes closed. Pt required max multimodal cues and max A for supine >sit onto EOB this session. Pt standing x 3 reps from bed and each time pt unable to sequence taking step to R and therefore needing max A [facility] Pt feeling unwell and having diarrhea with foul odor. RN notified. Pt requiring total assistance with clothing management and hygiene. Pt standing and attempting to get back into bed with max A from therapist. Pt with eyes immediately closed once returning to bed. Pt missing OT intervention secondary to feeling unwell and fatigue. Bed alarm activated and call bell within reach upon exiting the room.   Therapy Documentation Precautions:  Precautions Precautions: Fall Precaution Comments: R shoulder sling Other Brace/Splint: has cortrax feeding tube Restrictions Weight Bearing Restrictions: Yes RUE Weight Bearing: Non weight bearing RLE Weight Bearing: Weight bearing as tolerated LLE Weight Bearing: Weight bearing as tolerated General: General OT Amount of Missed Time: 24 Minutes Vital Signs: Therapy Vitals Temp: 97.7 F (36.5 C) Temp Source: Oral Pulse Rate: 92 Resp: 18 BP: 114/78 Patient Position (if appropriate): Lying Oxygen Therapy SpO2: 97 % O2 Device: Not Delivered Pain: Pain Assessment Pain Score: 4   See Function Navigator for Current Functional Status.   Therapy/Group: Individual Therapy  [name], Raydel Hosick P 07/16/2016, 7:58 AM

## 2016-07-16 NOTE — Progress Notes (Signed)
Speech Language Pathology Daily Session Notes  Patient Details  Name: Tyler AskewDavid Lane MRN: [id number] Date of Birth: 1950/11/28  Today's Date: 07/16/2016  Session 1: SLP Individual Time: 4098-11910830-0915 SLP Individual Time Calculation (min): 45 min   Session 2: SLP Individual Time: 1430-1500 SLP Individual Time Calculation (min): 30 min  Short Term Goals: Week 2: SLP Short Term Goal 1 (Week 2): Patient will orient to place and situation with Max A verbal and visual cues.  SLP Short Term Goal 2 (Week 2): Patient will demonstrate sustained attention to a functional task for ~2 minutes with Max A multimodal cues.  SLP Short Term Goal 3 (Week 2): Patient will initiate functional tasks within 1 minute with Max A verbal cues . SLP Short Term Goal 4 (Week 2): Patient will follow commands in 75% of opportunities with Mod A verbal cues.  SLP Short Term Goal 5 (Week 2): Patient will consume trials of Dys. 1 textures with thin liquids without overt s/s of aspiration and Mod A verbal cues for swallow initiation in less than 20% of opportunities over 2 sessions prior to upgrade.   Skilled Therapeutic Interventions:  Session 1: Skilled treatment session focused on cognitive and dysphagia goals. Upon arrival, patient was asleep but easily awakened. SLP facilitated session by providing Max A verbal cues for patient to donn shorts and socks and to scoot to EOB. Patient also transferred from the bed to the wheelchair with extra time and Max A verbal cues for problem solving/sequencing. Patient declined solids despite Max encouragement but consumed thin liquids with overt cough X 1, suspect due to premature spillage from patient "swishing" liquids in oral cavity. Recommend patient continue thin liquid diet. Patient handed off to PT. Continue with current plan of care.   Session 2: Skilled treatment session focused on cognitive goals. SLP facilitated session by providing total A for orientation to place and month. SLP  created visual cue to maximize recall of information and required total A to utilize external aid after a 60 second delay. Patient also named familiar family members in pictures with extra time and Max A verbal cues. Patient left upright in bed with alarm on and all needs within reach. Continue with current plan of care.   Function:  Eating Eating   Modified Consistency Diet: Yes Eating Assist Level: Supervision or verbal cues           Cognition Comprehension Comprehension assist level: Understands basic 25 - 49% of the time/ requires cueing 50 - 75% of the time  Expression   Expression assist level: Expresses basic 25 - 49% of the time/requires cueing 50 - 75% of the time. Uses single words/gestures.  Social Interaction Social Interaction assist level: Interacts appropriately 25 - 49% of time - Needs frequent redirection.  Problem Solving Problem solving assist level: Solves basic 25 - 49% of the time - needs direction more than half the time to initiate, plan or complete simple activities  Memory Memory assist level: Recognizes or recalls 25 - 49% of the time/requires cueing 50 - 75% of the time    Pain Pain Assessment Pain Score: 0-No pain  Therapy/Group: Individual Therapy  Tyler Lane 07/16/2016, 3:34 PM

## 2016-07-16 NOTE — Progress Notes (Signed)
Physical Therapy Session Note  Patient Details  Name: Tyler Lane MRN: [id number] Date of Birth: 06/09/1950  Today's Date: 07/16/2016 PT Individual Time: 4098-11910919-1017 PT Individual Time Calculation (min): 58 min   Short Term Goals: Week 2:  PT Short Term Goal 1 (Week 2): Pt will ambulate consistently 50 ft with +1 assist. PT Short Term Goal 2 (Week 2): Pt will initate stair training to prepare for home access.  Skilled Therapeutic Interventions/Progress Updates:  Pt received in w/c & agreeable to tx. Therapist donned pt's sling total assist to help pt maintain NWB RUE. Session focused on cognitive remediation (attention, problem solving, awareness, orientation), and functional mobility (transfers, gait, & stair negotiation). Pt completes sit<>stand transfers with steady assist and arm rests. Pt ambulates 10 ft + 15 ft w/c<>stairs with LUE over therapist's shoulder; pt with decreased weight shifting to RLE after negotiating stairs. Pt negotiated 12 steps (6" + 3") with L rail and min assist with occasional cuing for compensatory pattern. Pt also requires cuing to attend and continue participating in task. Utilized calendar in hallway to attempt to orient pt to date (pt reports it's "1118"). Pt continues to not be oriented to situation, location, and is unable to recall name of children - therapist reoriented pt throughout session. Pt engaged in identifying and matching cards on velcro board. Pt requires max verbal cuing to maintain alertness and attend to task. Pt able to accurately identify 50% of the cards and requires max cuing to create appropriate matches. Pt frequently falling asleep during task, with therapist providing max cuing/stimulation and playing music to attempt to increase pt's engagement. At end of session pt left sitting in w/c at nurses station with QRB donned.   Therapy Documentation Precautions:  Precautions Precautions: Fall Precaution Comments: R shoulder sling Other  Brace/Splint: has cortrax feeding tube Restrictions Weight Bearing Restrictions: Yes RUE Weight Bearing: Non weight bearing RLE Weight Bearing: Weight bearing as tolerated LLE Weight Bearing: Weight bearing as tolerated  Pain: No c/o pain. Session with decreased ability to weight bear through RLE & rest breaks provided PRN.  See Function Navigator for Current Functional Status.   Therapy/Group: Individual Therapy  Sandi MariscalVictoria M Kelina Beauchamp 07/16/2016, 12:34 PM

## 2016-07-16 NOTE — Progress Notes (Signed)
Today's labs reveal hypokalemia. Received suppository yesterday with diarrhea today. Liquids intake limited per nursing. Will supplement X 2 days and recheck on thurs.

## 2016-07-17 ENCOUNTER — Inpatient Hospital Stay (HOSPITAL_COMMUNITY): Payer: Medicare HMO | Admitting: Occupational Therapy

## 2016-07-17 ENCOUNTER — Inpatient Hospital Stay (HOSPITAL_COMMUNITY): Payer: Medicare HMO

## 2016-07-17 ENCOUNTER — Inpatient Hospital Stay (HOSPITAL_COMMUNITY): Payer: Medicare HMO | Admitting: Speech Pathology

## 2016-07-17 LAB — GLUCOSE, CAPILLARY
GLUCOSE-CAPILLARY: 120 mg/dL — AB (ref 65–99)
GLUCOSE-CAPILLARY: 118 mg/dL — AB (ref 65–99)
GLUCOSE-CAPILLARY: 105 mg/dL — AB (ref 65–99)
Glucose-Capillary: 132 mg/dL — ABNORMAL HIGH (ref 65–99)
Glucose-Capillary: 110 mg/dL — ABNORMAL HIGH (ref 65–99)

## 2016-07-17 MED ORDER — FREE WATER
150.0000 mL | Freq: Four times a day (QID) | Status: DC
  Administered 2016-07-17 – 2016-07-24 (×28): 150 mL

## 2016-07-17 MED ORDER — JEVITY 1.2 CAL PO LIQD
1000.0000 mL | ORAL | Status: DC
  Filled 2016-07-17: qty 237
  Filled 2016-07-17: qty 1000

## 2016-07-17 NOTE — Progress Notes (Signed)
Occupational Therapy Session Note  Patient Details  Name: Tyler AskewDavid Selkirk MRN: [id number] Date of Birth: Jul 27, 1950  Today's Date: 07/17/2016 OT Individual Time: 4098-11911030-1156 OT Individual Time Calculation (min): 86 min    Short Term Goals: Week 2:  OT Short Term Goal 1 (Week 2): Pt will perform toilet transfer with min A. OT Short Term Goal 2 (Week 2): Pt will perform bathing at shower level with mod A and mod multimodal cues for routine task.   Skilled Therapeutic Interventions/Progress Updates:    Pt received at nurses station sitting up in w/c. Focus of session on increasing independence with ADL tasks (toileting, bathing, UB/LB dressing, grooming) and on cognitive remediation. Pt able to complete stand pivot transfer w/c to toilet with ModA and MinA for clothing management. Pt completed seated bathing in shower, requires assist to wash back and MinA to steady when standing to wash perineal area and buttocks. Pt requires increased time and Mod-Max multimodal cues to initiate, sequence, and progress through shower task. Pt perseverating on washing chest/abdomen and requires verbal/tactile cues to redirect. Pt completed UB/LB dressing with setup and increased time to initiate task completion. Requires MinA to steady when standing to pull up underwear/pants. Pt requires rest break prior to completing seated grooming ADLs at sink. Completed brushing teeth and hair with setup and Min verbal cues for task completion. Pt participated in orientation retraining in moderately distracting environment and with use of external aide. Requires Max verbal and visual cues to locate month, date, day, and year. Pt participated in looking through photo book and identifying family members, requiring multimodal cues to focus on task and identify family. Ended session with Pt sitting up in w/c at nurses station.    Therapy Documentation Precautions:  Precautions Precautions: Fall Precaution Comments: R shoulder  sling Other Brace/Splint: has cortrax feeding tube Restrictions Weight Bearing Restrictions: Yes RUE Weight Bearing: Non weight bearing RLE Weight Bearing: Weight bearing as tolerated LLE Weight Bearing: Weight bearing as tolerated   Pain: Pain Assessment Pain Assessment: Faces Pain Score:  Faces Pain Scale: No hurt Pain Location:  Pain Orientation: Right Pain Descriptors      See Function Navigator for Current Functional Status.   Therapy/Group: Individual Therapy  Orlando PennerBreanna L Filomena Pokorney 07/17/2016, 4:12 PM

## 2016-07-17 NOTE — Progress Notes (Signed)
Speech Language Pathology Daily Session Note  Patient Details  Name: Jannet AskewDavid Bahe MRN: [id number] Date of Birth: 22-Dec-1950  Today's Date: 07/17/2016 SLP Individual Time: 0730-0825 SLP Individual Time Calculation (min): 55 min  Short Term Goals: Week 2: SLP Short Term Goal 1 (Week 2): Patient will orient to place and situation with Max A verbal and visual cues.  SLP Short Term Goal 2 (Week 2): Patient will demonstrate sustained attention to a functional task for ~2 minutes with Max A multimodal cues.  SLP Short Term Goal 3 (Week 2): Patient will initiate functional tasks within 1 minute with Max A verbal cues . SLP Short Term Goal 4 (Week 2): Patient will follow commands in 75% of opportunities with Mod A verbal cues.  SLP Short Term Goal 5 (Week 2): Patient will consume trials of Dys. 1 textures with thin liquids without overt s/s of aspiration and Mod A verbal cues for swallow initiation in less than 20% of opportunities over 2 sessions prior to upgrade.   Skilled Therapeutic Interventions: Skilled treatment session focused on cognitive and dysphagia goals. Upon arrival, patient was awake and appeared more alert than previous session. SLP facilitated session by providing Max A verbal cues for patient to donn shorts and socks and to scoot to EOB. Patient also transferred from the bed to the wheelchair with extra time and Min A verbal cues for problem solving/sequencing. Patient consumed thin liquids via cup without overt s/s of aspiration and consumed 2 bites of Dys. 1 textures before declining the rest and demonstrated what appeared to be a delayed swallow initiation and impaired AP transit with multiple swallows needed but without overt s/s of aspiration. Recommend patient continue thin liquid diet with more trials of solid textures needed. Patient also participated in a basic calendar making task with Max-Total A verbal and visual cues needed for initiation, sustained attention and problem  solving. In 30 minutes, patient placed ~10 numbers onto the calendar. Patient left upright at RN station. Continue with current plan of care.      Function:  Eating Eating   Modified Consistency Diet: Yes Eating Assist Level: Supervision or verbal cues           Cognition Comprehension Comprehension assist level: Understands basic 25 - 49% of the time/ requires cueing 50 - 75% of the time  Expression   Expression assist level: Expresses basic 25 - 49% of the time/requires cueing 50 - 75% of the time. Uses single words/gestures.  Social Interaction Social Interaction assist level: Interacts appropriately 25 - 49% of time - Needs frequent redirection.  Problem Solving Problem solving assist level: Solves basic 25 - 49% of the time - needs direction more than half the time to initiate, plan or complete simple activities  Memory Memory assist level: Recognizes or recalls 25 - 49% of the time/requires cueing 50 - 75% of the time    Pain No/Denies Pain   Therapy/Group: Individual Therapy  Raylan Hanton 07/17/2016, 11:56 AM

## 2016-07-17 NOTE — Progress Notes (Signed)
Physical Therapy Session Note  Patient Details  Name: Tyler AskewDavid Lane MRN: [id number] Date of Birth: 1950-12-13  Today's Date: 07/17/2016 PT Individual Time: 0910-1004 PT Individual Time Calculation (min): 54 min    Skilled Therapeutic Interventions/Progress Updates:  Pt received in w/c & agreeable to tx. Session focused on functional mobility (transfers, gait), activity tolerance, and cognitive remediation. Pt completes sit<>stand transfers from w/c and low, soft couch with min assist and max cuing to initiate. Gait x 100 ft + 200 ft + 200 ft with LUE supported on therapist's shoulder and min assist for weight shifting R. Pt reporting "ouch" on one occasion with weight bearing through RLE; rest breaks provided PRN & RN made aware of pt's c/o pain. Pt completed car transfer from sedan simulated height with min assist. Pt requires max cuing and assistance to ensure pt safely sits in seat as he will attempt to sit prior to squaring up to seated surface. Pt engaged in conversation focusing on orientation; pt not oriented to age, location (unable to correctly chose between hospital vs school), or situation throughout session but when asked what hospital he was in pt able to report "[facility]". Therapist oriented pt throughout session but without carryover. At end of session pt left sitting in w/c with QRB donned & at nurses station.   Therapy Documentation Precautions:  Precautions Precautions: Fall Precaution Comments: R shoulder sling Other Brace/Splint: has cortrax feeding tube Restrictions Weight Bearing Restrictions: Yes RUE Weight Bearing: Non weight bearing RLE Weight Bearing: Weight bearing as tolerated LLE Weight Bearing: Weight bearing as tolerated   See Function Navigator for Current Functional Status.   Therapy/Group: Individual Therapy  Sandi MariscalVictoria M Lakshya Mcgillicuddy 07/17/2016, 12:38 PM

## 2016-07-17 NOTE — Progress Notes (Signed)
Nutrition Follow-up  DOCUMENTATION CODES:   Not applicable  INTERVENTION:  Increase Jevity 1.2 formula by 10 ml every 4 hours to goal rate of 80 ml/hr x 20 hours (may hold TF for up to 4 hours for therapy) with 30 ml Prostat BID to provide 2120 kcal (100% of needs), 119 grams of protein, and 1296 ml of free water.   Provide free water flushes of 150 ml every 6 hours via tube. Total free water: 1896 ml/day.   Encourage po intake at meals.   RD to continue to monitor.   NUTRITION DIAGNOSIS:   Inadequate oral intake related to inability to eat as evidenced by NPO status; diet advanced, po 5-10%; ongoing  GOAL:   Patient will meet greater than or equal to 90% of their needs; met  MONITOR:   TF tolerance, Weight trends, Labs, Skin, I & O's  REASON FOR ASSESSMENT:   Consult Enteral/tube feeding initiation and management  ASSESSMENT:   66 y.o. male with history of HTN, depression, hearing loss who was admitted on 06/21/16 after unwitnessed fall off 10' ladder. CT head done showing mild acute SAH along sylvian fissures bilaterally, at high left parietal lobe and righ frontal lobe. Work up also revealed displaced fracture of right mid clavicle, right 2nd to 6 rib fractures with pulmonary contusion, contusion, mildly displaced comminuted fracture of right pubic ramus and comminuted transverse fracture of proximal right pubic ramus and compression deformity of 6th and 7th thoracic vertebral body.  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  Pt was unavailable during time of visit. Pt is  currently on a clear liquid diet. Meal completion has been 5-20%. Plans to continue tube feeds to aid in adequate nutrition. Formula has been switched to Jevity 1.2 which has been infusing at 55 ml/hr x 20 hours and provides 1520 kcal (74% of kcal needs), 91 grams of protein (83% of protein). RD to modify tube feeds to meet 100% of nutrition needs as intake at clear liquid meals have been poor. RD to continue to monitor. Labs and medications reviewed.   Diet Order:  Diet clear liquid Room service appropriate? Yes; Fluid consistency: Thin  Skin:  Reviewed, no issues  Last BM:  6/4  Height:   Ht Readings from Last 1 Encounters:  06/21/16 5' 11"  (1.803 m)    Weight:   Wt Readings from Last 1 Encounters:  07/17/16 159 lb 12.8 oz (72.5 kg)    Ideal Body Weight:  78 kg  BMI:  Body mass index is 22.29 kg/m.  Estimated Nutritional Needs:   Kcal:  2050-2250  Protein:  110-125 grams  Fluid:  >2 L/day  EDUCATION NEEDS:   No education needs identified at this time  Corrin Parker, MS, RD, LDN Pager # [phone] After hours/ weekend pager # [phone]

## 2016-07-17 NOTE — Progress Notes (Signed)
Occupational Therapy Session Note  Patient Details  Name: Tyler AskewDavid Lane MRN: [id number] Date of Birth: [birth year]  Today's Date: 07/17/2016 OT Individual Time: 1530-1600 OT Individual Time Calculation (min): 30 min    Short Term Goals: Week 2:  OT Short Term Goal 1 (Week 2): Pt will perform toilet transfer with min A. OT Short Term Goal 2 (Week 2): Pt will perform bathing at shower level with mod A and mod multimodal cues for routine task.   Skilled Therapeutic Interventions/Progress Updates:    Pt received at RN station, seated in wheelchair with no c/o,signs, or symptoms of pain this session. OT demonstrated bean bag toss with pt standing from wheelchair with min A with steady assistance for standing balance to throw 6 bags this session. Pt able to stand and toss all 6 bags within 9 minutes. Pt returning to seated position to rest secondary to fatigue. Pt able to correctly state color of bag and food item  labelled on bag with mod multimodal cues. When asked what meal of the day he would eat each food pt required max multimodal cues and choice of two. Pt returned to wheelchair and quick release belt donned. Pt returned to RN station.   Therapy Documentation Precautions:  Precautions Precautions: Fall Precaution Comments: R shoulder sling Other Brace/Splint: has cortrax feeding tube Restrictions Weight Bearing Restrictions: Yes RUE Weight Bearing: Non weight bearing RLE Weight Bearing: Weight bearing as tolerated LLE Weight Bearing: Weight bearing as tolerated Pain: Pain Assessment Pain Assessment: Faces Faces Pain Scale: No hurt  See Function Navigator for Current Functional Status.   Therapy/Group: Individual Therapy  [name], Filippo Puls P 07/17/2016, 6:37 PM

## 2016-07-17 NOTE — Progress Notes (Signed)
Speech Language Pathology Daily Session Note  Patient Details  Name: Tyler AskewDavid Lohse MRN: [id number] Date of Birth: [birth year]  Today's Date: 07/17/2016 SLP Individual Time: 1400-1430 SLP Individual Time Calculation (min): 30 min  Short Term Goals: Week 2: SLP Short Term Goal 1 (Week 2): Patient will orient to place and situation with Max A verbal and visual cues.  SLP Short Term Goal 2 (Week 2): Patient will demonstrate sustained attention to a functional task for ~2 minutes with Max A multimodal cues.  SLP Short Term Goal 3 (Week 2): Patient will initiate functional tasks within 1 minute with Max A verbal cues . SLP Short Term Goal 4 (Week 2): Patient will follow commands in 75% of opportunities with Mod A verbal cues.  SLP Short Term Goal 5 (Week 2): Patient will consume trials of Dys. 1 textures with thin liquids without overt s/s of aspiration and Mod A verbal cues for swallow initiation in less than 20% of opportunities over 2 sessions prior to upgrade.   Skilled Therapeutic Interventions:  Pt was seen for skilled ST targeting cognitive goals.  Therapist offered trials of dys 1 textures to continue working towards diet advancement; however, pt declined offered trials.  Pt was awake and alert during today's therapy session and followed commands in a functional context (transferring out of bed) with mod-max assist verbal and tactile cues.  Pt required max assist and choice of two to reorient to place and situation.  Pt was left in wheelchair at nursing station at the end of today's therapy session.  Continue per current plan of care .    Function:  Eating Eating                 Cognition Comprehension Comprehension assist level: Understands basic 25 - 49% of the time/ requires cueing 50 - 75% of the time  Expression   Expression assist level: Expresses basic 25 - 49% of the time/requires cueing 50 - 75% of the time. Uses single words/gestures.  Social Interaction Social  Interaction assist level: Interacts appropriately 25 - 49% of time - Needs frequent redirection.  Problem Solving Problem solving assist level: Solves basic 25 - 49% of the time - needs direction more than half the time to initiate, plan or complete simple activities  Memory Memory assist level: Recognizes or recalls 25 - 49% of the time/requires cueing 50 - 75% of the time    Pain Pain Assessment Pain Assessment: No/denies pain  Therapy/Group: Individual Therapy  Shloma Roggenkamp, Melanee SpryNicole L 07/17/2016, 10:24 PM

## 2016-07-17 NOTE — Progress Notes (Signed)
[facility]     PROGRESS NOTE    Subjective/Complaints: No new problems reported. Working with SLP early this am  ROS: Limited due to cognitive/behavioral     Objective: Vital Signs: Blood pressure 117/88, pulse 86, temperature 97.6 F (36.4 C), temperature source Oral, resp. rate 14, weight 72.5 kg (159 lb 12.8 oz), SpO2 98 %. No results found.  Recent Labs  07/16/16 0655  WBC 8.6  HGB 16.9  HCT 48.6  PLT 216    Recent Labs  07/16/16 0655  NA 135  K 3.1*  CL 103  GLUCOSE 146*  BUN 20  CREATININE 0.69  CALCIUM 8.8*   CBG (last 3)   Recent Labs  07/17/16 0001 07/17/16 0349 07/17/16 0832  GLUCAP 132* 120* 132*    Wt Readings from Last 3 Encounters:  07/17/16 72.5 kg (159 lb 12.8 oz)  07/02/16 74.3 kg (163 lb 12.8 oz)    Physical Exam:  Constitutional: He appears well-developed and well-nourished. No distress    HENT:  Head: Normocephalic.  Right Ear: External ear normal.  Left Ear: External ear normal.  Mouth/Throat: Oropharynx is clear and moist.  Eyes: PERRL  Neck: supple  Cardiovascular: RRR Respiratory: CTA B GI: Soft. Bowel sounds are normal. He exhibits no distension. There is no tenderness.  Musculoskeletal: right shoulder tender. Neurological: He is alert.  Oriented to person only--needs further cues for orientation.     Follows basic commands. Limited insight and awareness.  Motor: moves all 4.  A&Ox1 only.  HOH  Skin: Skin is warm and dry.  Psychiatric: cooperative   Assessment/Plan: 1. 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 Physiatrist is providing close team supervision and 24 hour management of active medical problems listed below. Physiatrist and rehab team continue to assess barriers to discharge/monitor patient progress toward functional and medical  goals.  Function:  Bathing Bathing position Bathing activity did not occur: Refused Position: Shower  Bathing parts Body parts bathed by patient: Chest, Abdomen, Left arm, Right arm, Left upper leg, Front perineal area, Buttocks, Right upper leg, Left lower leg, Right lower leg Body parts bathed by helper: Back  Bathing assist Assist Level: Touching or steadying assistance(Pt > 75%)      Upper Body Dressing/Undressing Upper body dressing   What is the patient wearing?: Pull over shirt/dress     Pull over shirt/dress - Perfomed by patient: Thread/unthread right sleeve, Thread/unthread left sleeve, Put head through opening, Pull shirt over trunk Pull over shirt/dress - Perfomed by helper: Pull shirt over trunk        Upper body assist Assist Level: Supervision or verbal cues, Set up   Set up : To obtain clothing/put away  Lower Body Dressing/Undressing Lower body dressing   What is the patient wearing?: Pants, Non-skid slipper socks Underwear - Performed by patient: Thread/unthread right underwear leg, Thread/unthread left underwear leg, Pull underwear up/down   Pants- Performed by patient: Thread/unthread right pants leg, Thread/unthread left pants leg, Pull pants up/down Pants- Performed by helper: Thread/unthread right pants leg, Pull pants up/down Non-skid slipper socks- Performed by patient: Don/doff right sock, Don/doff left sock Non-skid slipper socks- Performed by helper: Don/doff right sock Socks - Performed by patient: Don/doff right sock, Don/doff left sock Socks - Performed by helper: Don/doff right sock, Don/doff left sock Shoes - Performed by patient: Don/doff right shoe, Don/doff left shoe Shoes - Performed  by helper: Don/doff right shoe, Don/doff left shoe          Lower body assist Assist for lower body dressing: Touching or steadying assistance (Pt > 75%)      Toileting Toileting Toileting activity did not occur: No continent bowel/bladder event    Toileting steps completed by helper: Adjust clothing prior to toileting, Performs perineal hygiene, Adjust clothing after toileting    Toileting assist Assist level:  (total A)   Transfers Chair/bed transfer Chair/bed transfer activity did not occur: Safety/medical concerns Chair/bed transfer method: Stand pivot Chair/bed transfer assist level: Maximal assist (Pt 25 - 49%/lift and lower) Chair/bed transfer assistive device: Armrests     Locomotion Ambulation Ambulation activity did not occur: Safety/medical concerns   Max distance: 15 ft  Assist level: Touching or steadying assistance (Pt > 75%)   Wheelchair Wheelchair activity did not occur: Safety/medical concerns   Max wheelchair distance: 20' Assist Level: Maximal assistance (Pt 25 - 49%) (BLE)  Cognition Comprehension Comprehension assist level: Understands basic 25 - 49% of the time/ requires cueing 50 - 75% of the time  Expression Expression assist level: Expresses basic 25 - 49% of the time/requires cueing 50 - 75% of the time. Uses single words/gestures.  Social Interaction Social Interaction assist level: Interacts appropriately 25 - 49% of time - Needs frequent redirection.  Problem Solving Problem solving assist level: Solves basic 25 - 49% of the time - needs direction more than half the time to initiate, plan or complete simple activities  Memory Memory assist level: Recognizes or recalls 25 - 49% of the time/requires cueing 50 - 75% of the time   Medical Problem List and Plan: 1.  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  DVT Prophylaxis/Anticoagulation: Mechanical: Sequential compression devices, below knee Bilateral lower extremities 3. Pain Management: Hydrocodone prn. Monitor for sedation.  4. Mood: [name] to follow for evaluation and support as mentation improves.  5. Neuropsych: This patient is not capable of making  decisions on his own behalf.  -  ritalin for attention/arousal/initiation-- 10mg --has helped 6. Skin/Wound Care: routine skin care 7. Fluids/Electrolytes/Nutrition: on clears  -continue TF as well  -k+ supp 8. Right displaced sacral and right pubic rami fracture: WBAT 9. Right clavicle fracture: NWB --to keep arm below 90 degrees and sling when out of bed. 10. Acute urinary retention:    toilet patient every 4 hours.   -ua neg, ucx neg also  -urecholine trial. Improving continence a whole 11. MSSA RLL PNA:  . Rocephin complete    -afebrile currently. No clinical signs of active disease  -pt on clear liquids now   -wbc's normal 12. Pre-renal azotemia: changed tube feed to Nephro. Continue H2O flushes   - BUN trending down to 23 on 5/31---down to 20 today 6/4  -clears per SLP 13. HTN: On metoprolol bid.  Fair control at present    LOS (Days) 15 A FACE TO FACE EVALUATION WAS PERFORMED  Ranelle Oyster, MD 07/17/2016 9:20 AM

## 2016-07-18 ENCOUNTER — Inpatient Hospital Stay (HOSPITAL_COMMUNITY): Payer: Medicare HMO | Admitting: Speech Pathology

## 2016-07-18 ENCOUNTER — Inpatient Hospital Stay (HOSPITAL_COMMUNITY): Payer: Medicare HMO | Admitting: Occupational Therapy

## 2016-07-18 ENCOUNTER — Inpatient Hospital Stay (HOSPITAL_COMMUNITY): Payer: Medicare HMO | Admitting: Physical Therapy

## 2016-07-18 LAB — GLUCOSE, CAPILLARY
GLUCOSE-CAPILLARY: 106 mg/dL — AB (ref 65–99)
GLUCOSE-CAPILLARY: 107 mg/dL — AB (ref 65–99)
GLUCOSE-CAPILLARY: 111 mg/dL — AB (ref 65–99)
GLUCOSE-CAPILLARY: 132 mg/dL — AB (ref 65–99)
GLUCOSE-CAPILLARY: 92 mg/dL (ref 65–99)
Glucose-Capillary: 128 mg/dL — ABNORMAL HIGH (ref 65–99)

## 2016-07-18 MED ORDER — PRO-STAT SUGAR FREE PO LIQD
60.0000 mL | Freq: Two times a day (BID) | ORAL | Status: DC
  Administered 2016-07-18 – 2016-07-24 (×12): 60 mL via ORAL
  Filled 2016-07-18 (×12): qty 60

## 2016-07-18 MED ORDER — HYDROCERIN EX CREA
TOPICAL_CREAM | Freq: Two times a day (BID) | CUTANEOUS | Status: DC
  Administered 2016-07-18 – 2016-07-31 (×25): via TOPICAL
  Filled 2016-07-18: qty 113

## 2016-07-18 MED ORDER — BOOST / RESOURCE BREEZE PO LIQD
1.0000 | Freq: Two times a day (BID) | ORAL | Status: DC
  Administered 2016-07-20 – 2016-07-24 (×7): 1 via ORAL

## 2016-07-18 MED ORDER — JEVITY 1.2 CAL PO LIQD
1000.0000 mL | ORAL | Status: DC
  Administered 2016-07-18 – 2016-07-23 (×6): 1000 mL
  Filled 2016-07-18: qty 237
  Filled 2016-07-18: qty 1000
  Filled 2016-07-18 (×2): qty 237

## 2016-07-18 NOTE — Progress Notes (Signed)
Speech Language Pathology Weekly Progress and Session Note  Patient Details  Name: Tyler Lane MRN: [id number] Date of Birth: [birth year]  Beginning of progress report period: Jul 11, 2016 End of progress report period: July 18, 2016  Today's Date: 07/18/2016 SLP Individual Time: [id number] SLP Individual Time Calculation (min): 45 min  Short Term Goals: Week 2: SLP Short Term Goal 1 (Week 2): Patient will orient to place and situation with Max A verbal and visual cues.  SLP Short Term Goal 1 - Progress (Week 2): Not met SLP Short Term Goal 2 (Week 2): Patient will demonstrate sustained attention to a functional task for ~2 minutes with Max A multimodal cues.  SLP Short Term Goal 2 - Progress (Week 2): Not met SLP Short Term Goal 3 (Week 2): Patient will initiate functional tasks within 1 minute with Max A verbal cues . SLP Short Term Goal 3 - Progress (Week 2): Not met SLP Short Term Goal 4 (Week 2): Patient will follow commands in 75% of opportunities with Mod A verbal cues.  SLP Short Term Goal 4 - Progress (Week 2): Not met SLP Short Term Goal 5 (Week 2): Patient will consume trials of Dys. 1 textures with thin liquids without overt s/s of aspiration and Mod A verbal cues for swallow initiation in less than 20% of opportunities over 2 sessions prior to upgrade.  SLP Short Term Goal 5 - Progress (Week 2): Not met    New Short Term Goals: Week 3: SLP Short Term Goal 1 (Week 3): Patient will consume trials of Dys. 1 textures without overt s/s of aspiration and Mod A verbal cues for swallow initiation in less than 20% of opportunities over 2 sessions prior to upgrade.  SLP Short Term Goal 2 (Week 3): Patient will follow commands in 75% of opportunities with Mod A verbal cues.  SLP Short Term Goal 3 (Week 3): Patient will initiate functional tasks within 1 minute with Max A verbal cues . SLP Short Term Goal 4 (Week 3): Patient will demonstrate sustained attention to a functional task for  ~2 minutes with Max A multimodal cues.  SLP Short Term Goal 5 (Week 3): Patient will orient to place and situation with Max A verbal and visual cues.   Weekly Progress Updates: Patient has made functional but minimal gains this reporting period and has met 0 of 6 STG's.Currently, patient is consuming a clear liquid diet without overt s/s of aspiration but requires encouragement for intake. Solid foods have not been initiated due to minimal interest in PO intake making it difficult to assess potential for possible upgrade. Patient's greatest barrier to diet initiation continues to be his impaired initiation and decreased sustained attention. Patient continues to demonstrate behaviors consistent with a Rancho level V and requires Max-Total A for orientation, initiation, sustained attention, problem solving, awareness and recall. Patient and family education is ongoing. Patient would benefit from continued skilled SLP intervention to maximize his cognitive and swallowing function prior to discharge in order to maximize his overall functional independence.      Intensity: Minumum of 1-2 x/day, 30 to 90 minutes Frequency: 3 to 5 out of 7 days Duration/Length of Stay: 07/31/16 Treatment/Interventions: Dysphagia/aspiration precaution training;Environmental controls;Cueing hierarchy;Internal/external aids;Patient/family education;Cognitive remediation/compensation;Functional tasks;Therapeutic Activities   Daily Session  Skilled Therapeutic Interventions:  Skilled treatment session focused on cognitive goals. SLP facilitated session by providing Max A multimodal cues and more than a reasonable amount of time (~10 minutes) for patient to initiate and select attention in  a mildly distracting environment to writing his name. Patient also required ~20 minutes and Max A multimodal cues for initiation and selective attention in a mildly distracting enviornemnt to write his address (independently recalled street  address and zip code and Max A for city and state) and generate a list of 3 animals. Patient demonstrated decreased ability to follow directions and decoded all auditory information very literal today (ex. Clinician asked, "Anguel, how do yo spell your name?" and patient responded "[id number]"). Patient left upright in wheelchair at RN station. Continue with current plan of care.     Function:    Eating Eating   Modified Consistency Diet: Yes Eating Assist Level: More than reasonable amount of time;Set up assist for;Supervision or verbal cues;Helper performs IV, parenteral or tube feed   Eating Set Up Assist For: Opening containers;Parenteral or tube feed supplies       Cognition Comprehension Comprehension assist level: Understands basic 50 - 74% of the time/ requires cueing 25 - 49% of the time  Expression   Expression assist level: Expresses basic 25 - 49% of the time/requires cueing 50 - 75% of the time. Uses single words/gestures.  Social Interaction Social Interaction assist level: Interacts appropriately 25 - 49% of time - Needs frequent redirection.  Problem Solving Problem solving assist level: Solves basic 25 - 49% of the time - needs direction more than half the time to initiate, plan or complete simple activities  Memory Memory assist level: Recognizes or recalls 25 - 49% of the time/requires cueing 50 - 75% of the time   Pain No/Denies Pain   Therapy/Group: Individual Therapy  Tyler Lane 07/18/2016, 3:24 PM

## 2016-07-18 NOTE — Progress Notes (Signed)
Physical Therapy Session Note  Patient Details  Name: Tyler AskewDavid Mandato MRN: [id number] Date of Birth: 05/17/1950  Today's Date: 07/18/2016 PT Individual Time: [id number] PT Individual Time Calculation (min): 56 min   Short Term Goals: Week 3:  PT Short Term Goal 1 (Week 3): Pt will ambulate consistently 50 ft with min assist +1.  PT Short Term Goal 2 (Week 3): Pt will complete supine<>sit with min assist.   Skilled Therapeutic Interventions/Progress Updates:  Pt received in bed & agreeable to tx. Session focused on functional mobility (bed mobility, transfers) and cognitive remediation. Pt requires max multimodal cuing to initiate supine>sitting EOB and min assist overall. Pt donned shirt with max assist. Pt with incontinent void and performed peri hygiene with cuing and set up assist with washcloth. Pt then donned shorts without assistance. Pt required step by step instructions to perform hand hygiene standing at sink.  In dayroom pt engaged in card matching task. Pt requires max cuing to correctly identify cards and attend to task. Pt able to create 7/9 matches correctly with max cuing. Attempted to have pt engage in task while standing but pt consistently returned to sitting in w/c. At end of session pt left sitting in w/c with QRB donned & at nurses station.   Therapy Documentation Precautions:  Precautions Precautions: Fall Precaution Comments: R shoulder sling Other Brace/Splint: has cortrax feeding tube Restrictions Weight Bearing Restrictions: Yes RUE Weight Bearing: Non weight bearing RLE Weight Bearing: Weight bearing as tolerated LLE Weight Bearing: Weight bearing as tolerated  Pain: Pt with behaviors noting pain with weightbearing RLE - rest breaks provided PRN.  See Function Navigator for Current Functional Status.   Therapy/Group: Individual Therapy  Sandi MariscalVictoria M Koya Hunger 07/18/2016, 12:56 PM

## 2016-07-18 NOTE — Progress Notes (Signed)
Nutrition Follow-up  DOCUMENTATION CODES:   Not applicable  INTERVENTION:  Continue Jevity 1.2 formula at  goal rate of 40 ml/hr x 20 hours (may hold TF for up to 4 hours for therapy) with 60 ml Prostat BID to provide 1360 kcal (66% of kcal needs), 104 grams of protein (95% of protein needs), and 648 ml of free water.   Continue free water flushes of 150 ml every 6 hours via tube. Total free water: 1248 ml/day.   Provide Boost Breeze po BID, each supplement provides 250 kcal and 9 grams of protein.  Encourage po intake at meals.   RD to continue to monitor.   NUTRITION DIAGNOSIS:   Inadequate oral intake related to inability to eat as evidenced by NPO status; diet advanced to clear liquids; po 15%  GOAL:   Patient will meet greater than or equal to 90% of their needs; progressing  MONITOR:   PO intake, Supplement acceptance, Diet advancement, Labs, Weight trends, TF tolerance, Skin, I & O's  REASON FOR ASSESSMENT:   Consult Enteral/tube feeding initiation and management  ASSESSMENT:   66 y.o. male with history of HTN, depression, hearing loss who was admitted on 06/21/16 after unwitnessed fall off 10' ladder. CT head done showing mild acute SAH along sylvian fissures bilaterally, at high left parietal lobe and righ frontal lobe. Work up also revealed displaced fracture of right mid clavicle, right 2nd to 6 rib fractures with pulmonary contusion, contusion, mildly displaced comminuted fracture of right pubic ramus and comminuted transverse fracture of proximal right pubic ramus and compression deformity of 6th and 7th thoracic vertebral body.  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  Tube feeds have been reduced by half to encourage meal intake. Pt reports he has been trying to consume his liquids at meals. Meal completion today has been 10-15%. RD to increase Prostat to aid in adequate protein intake. RD to additionally order Boost Breeze to maximize po intake. Pt encouraged to consume his liquids at meals.   Diet Order:  Diet clear liquid Room service appropriate? Yes; Fluid consistency: Thin  Skin:  Reviewed, no issues  Last BM:  6/4  Height:   Ht Readings from Last 1 Encounters:  06/21/16 5\' 11"  (1.803 m)    Weight:   Wt Readings from Last 1 Encounters:  07/18/16 159 lb 3.2 oz (72.2 kg)    Ideal Body Weight:  78 kg  BMI:  Body mass index is 22.2 kg/m.  Estimated Nutritional Needs:   Kcal:  2050-2250  Protein:  110-125 grams  Fluid:  >2 L/day  EDUCATION NEEDS:   No education needs identified at this time  Roslyn SmilingStephanie Ashleynicole Mcclees, MS, RD, LDN Pager # [phone] After hours/ weekend pager # [phone]

## 2016-07-18 NOTE — Progress Notes (Signed)
Occupational Therapy Session Note  Patient Details  Name: Tyler AskewDavid Lane MRN: [id number] Date of Birth: 09/22/1950  Today's Date: 07/18/2016 OT Individual Time: 1115-1200 and 1430-1454 OT Individual Time Calculation (min): 45 min and 24 min    Short Term Goals: Week 2:  OT Short Term Goal 1 (Week 2): Pt will perform toilet transfer with min A. OT Short Term Goal 2 (Week 2): Pt will perform bathing at shower level with mod A and mod multimodal cues for routine task.   Skilled Therapeutic Interventions/Progress Updates:  Session 1: Pt received at RN station with no c/o pain this session. Pt returned to room for timed toileting. Pt ambulating 10' into bathroom with mod hand held assistance. Pt performed clothing management and hygiene with mod verbal cues, increased time, and steady assistance for balance. Pt attempting to have BM but unsuccessful. Pt was able to void urine. OT attempted to have to stand at sink to wash hands but pt resistive and pulling self towards wheelchair. Pt seated and washing hands with max cues for sequencing and initiating task. Quick release belt donned and RN arrived with medications and to scan bladder.    Session 2: Pt received at RN station. Pt very lethargic this session and having difficulty remaining awake. Pt seated in wheelchair for table top nuts and bolts board. Pt initiating placement of washer and bolt with maximal multimodal cues and over 8 minutes to initiate task. Pt verbalized, " I'm tired." Pt unable to remain awake and therapist returned pt to bed with min A stand pivot transfer. Bed alarm activated and call bell within reach.   Therapy Documentation Precautions:  Precautions Precautions: Fall Precaution Comments: R shoulder sling Other Brace/Splint: has cortrax feeding tube Restrictions Weight Bearing Restrictions: Yes RUE Weight Bearing: Non weight bearing RLE Weight Bearing: Weight bearing as tolerated LLE Weight Bearing: Weight bearing as  tolerated General:   Vital Signs: Therapy Vitals Temp: 98 F (36.7 C) Temp Source: Oral Pulse Rate: 86 Resp: 18 BP: (!) 136/100 (RN notified) Patient Position (if appropriate): Lying Oxygen Therapy SpO2: 97 % O2 Device: Not Delivered  See Function Navigator for Current Functional Status.   Therapy/Group: Individual Therapy  [name], [name] 07/18/2016, 4:17 PM

## 2016-07-18 NOTE — Progress Notes (Signed)
Physical Therapy Session Note  Patient Details  Name: Tyler Lane MRN: [id number] Date of Birth: 04/08/1950  Today's Date: 07/18/2016 PT Individual Time: 1605-1700 PT Individual Time Calculation (min): 55 min   Short Term Goals: Week 3:  PT Short Term Goal 1 (Week 3): Pt will ambulate consistently 50 ft with min assist +1.  PT Short Term Goal 2 (Week 3): Pt will complete supine<>sit with min assist.   Skilled Therapeutic Interventions/Progress Updates:   Pt received supine in bed and agreeable to PT. Supine>sit transfer with min assist and min cues for decreased use of R UE. Stand pivot transfer to WC with supervision assist from PT.   Gait training with min assist from PT x 70ft. antalgia on the RLE, but pt reports no pain.   Cognitive tasks to build hot air balloon puzzle with significant difficulty attending to task. Poor problem solving ability due to excessive number of choices to build puzzle.  Sorting task for colored bean bags. Pt able to correctly retrieve and sort up to 2 fields, but unsuccessful with 3 fields.   Pt returned to room and performed ambulatory  transfer to bed with min assist. Sit>supine completed with supervision assist and left supine in bed with call bell in reach and all needs met.       Therapy Documentation Precautions:  Precautions Precautions: Fall Precaution Comments: R shoulder sling Other Brace/Splint: has cortrax feeding tube Restrictions Weight Bearing Restrictions: Yes RUE Weight Bearing: Non weight bearing RLE Weight Bearing: Weight bearing as tolerated LLE Weight Bearing: Weight bearing as tolerated   Pain: 0/10   See Function Navigator for Current Functional Status.   Therapy/Group: Individual Therapy   E  07/18/2016, 11:57 PM  

## 2016-07-18 NOTE — Patient Care Conference (Signed)
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 SAH (subarachnoid hemorrhage) (HCC) 07/02/2016  . Intracerebral hemorrhage, intraventricular (HCC) 07/02/2016  . Right clavicle fracture 07/02/2016  . Fracture of multiple ribs 07/02/2016  . Sacral fracture (HCC) 07/02/2016  . Chest trauma   . Closed fracture of one rib   . Urinary retention   . Pneumonia of right lower lobe due to methicillin susceptible Staphylococcus aureus (MSSA) (HCC)   . Prerenal azotemia   . 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 Fracture of clavicle, right, closed 06/22/2016  . TBI (traumatic brain injury) (HCC) 06/21/2016    Expected Discharge Date: Expected Discharge Date: 07/31/16  Team Members Present: Physician leading conference: Dr. Faith RogueZachary Swartz Social Worker Present: [name], [name] Nurse Present: Chana Bodeeborah Sharp, RN PT Present: Aleda GranaVictoria Miller, PT OT Present: Callie FieldingKatie Pittman, OT SLP Present: Jackalyn LombardNicole Page, SLP PPS Coordinator present : Tora DuckMarie Noel, RN, CRRN     Current Status/Progress Goal Weekly Team Focus  Medical   more alert. still confused. attention may be better. tolerating clears. continuing on TF also.   improve attention and cognition  pain, nutrition, cognition    Bowel/Bladder   Incontinent of bowel/bladder. LBM 06/15/16  Patient to be continent of bowel/bladder with mod I  moniotr bowel/bladder  function and time toilet q 2hrs and as needed   Swallow/Nutrition/ Hydration   Clear liquid diet, Full supervision  Min A with least restrictive diet  trials of solids textures, however, patient continues to show limited interest in PO intake.    ADL's   Min A bathing at shower level, supervision UB dressing, min guard LB dressing, Mod A toilet transfer, Max A toileting   Min A overall  Cognitive remediation, self care mgt retraining, balance, functional transfers, family education   Mobility   min assist sit<>stand, min assist ambulation with HHA, min assist stair negotiation with 1 rail, impaired cognition  min assist overall  cognitive remediation, endurance, activity tolerance, strengthening, balance, transfers   Communication             Safety/Cognition/ Behavioral Observations  Rancho Level V, Max-Total A  Min A  initiation, orientation, attention    Pain   Complain of pain. Hycet PRN given and was effective  <2   Assess and treat pain q shift and as needed   Skin   Intact  Skin to be fre of breakdown/infection while in RH  Assess skin q shift and as needed    Rehab Goals Patient on target to meet rehab goals: Yes *See Care Plan and progress notes for long and short-term goals.  Barriers to Discharge: ongoing cognitive deficits, ortho injuries, continued dysphagia    Possible Resolutions to Barriers:  continued remediation, advancing diet as possible    Discharge Planning/Teaching Needs:  Plan for pt to d/c home with s/o who will share caregiver responsibilities with pt's local daughter.  They are aware will need 24/7 care.  Teaching to be ongoing.   Team Discussion:  Making good progress this week.  Min assit with amb 200' and stairs.  Better attention.  Does much better with cont with timed toileting.  Still with little interest in  eating - continue NG feeds for now.  SW to follow up with family about plans for 24/7 care.  Revisions to Treatment Plan:  None   Continued Need for Acute Rehabilitation Level of Care: The patient requires daily medical management by a physician with specialized training in physical medicine and rehabilitation for the following conditions: Daily direction of a multidisciplinary physical rehabilitation program to ensure safe treatment while eliciting the highest outcome that is of practical value to the patient.: Yes Daily medical management of patient stability for increased activity during participation in an intensive rehabilitation regime.: Yes Daily analysis of laboratory values and/or radiology reports with any subsequent need for medication adjustment of medical intervention for : Post surgical problems;Neurological problems;Nutritional problems  Tyler Lane 07/18/2016, 2:25 PM

## 2016-07-18 NOTE — Progress Notes (Signed)
[place] PHYSICAL MEDICINE & REHABILITATION     PROGRESS NOTE    Subjective/Complaints: Lying in bed. Feels well. Denies pain. States he wants to eat.   ROS: Limited due to cognitive/behavioral      Objective: Vital Signs: Blood pressure 133/85, pulse 71, temperature 98.6 F (37 C), temperature source Oral, resp. rate 18, weight 72.2 kg (159 lb 3.2 oz), SpO2 97 %. No results found.  Recent Labs  07/16/16 0655  WBC 8.6  HGB 16.9  HCT 48.6  PLT 216    Recent Labs  07/16/16 0655  NA 135  K 3.1*  CL 103  GLUCOSE 146*  BUN 20  CREATININE 0.69  CALCIUM 8.8*   CBG (last 3)   Recent Labs  07/18/16 0008 07/18/16 0455 07/18/16 0759  GLUCAP 106* 132* 128*    Wt Readings from Last 3 Encounters:  07/18/16 72.2 kg (159 lb 3.2 oz)  07/02/16 74.3 kg (163 lb 12.8 oz)    Physical Exam:  Constitutional: He appears well-developed and well-nourished. No distress    HENT:  Head: Normocephalic.  Right Ear: External ear normal.  Left Ear: External ear normal.  Mouth/Throat: Oropharynx is clear and moist.  Eyes: PERRL  Neck: supple  Cardiovascular: RRR Respiratory: CTA B GI: Soft. Bowel sounds are normal. He exhibits no distension. There is no tenderness.  Musculoskeletal: right shoulder less tender. Neurological: He is alert.  Oriented to person only.  Better attention.   Follows basic commands. Limited insight and awareness.  Motor: moves all 4.    HOH  Skin: Skin is warm and dry.  Psychiatric: cooperative   Assessment/Plan: 1. 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 Physiatrist is providing close team supervision and 24 hour management of active medical problems listed below. Physiatrist and rehab team continue to assess barriers to discharge/monitor patient progress toward functional and medical goals.  Function:  Bathing Bathing position  Bathing activity did not occur: Refused Position: Systems developer parts bathed by patient: Right arm, Left arm, Chest, Abdomen, Front perineal area, Buttocks, Right upper leg, Left upper leg, Right lower leg, Left lower leg Body parts bathed by helper: Back  Bathing assist Assist Level: Touching or steadying assistance(Pt > 75%)      Upper Body Dressing/Undressing Upper body dressing   What is the patient wearing?: Pull over shirt/dress     Pull over shirt/dress - Perfomed by patient: Thread/unthread right sleeve, Thread/unthread left sleeve, Put head through opening, Pull shirt over trunk Pull over shirt/dress - Perfomed by helper: Pull shirt over trunk        Upper body assist Assist Level: Supervision or verbal cues, Set up   Set up : To obtain clothing/put away  Lower Body Dressing/Undressing Lower body dressing   What is the patient wearing?: Underwear, Pants, Non-skid slipper socks Underwear - Performed by patient: Thread/unthread right underwear leg, Thread/unthread left underwear leg, Pull underwear up/down   Pants- Performed by patient: Thread/unthread right pants leg, Thread/unthread left pants leg, Pull pants up/down Pants- Performed by helper: Thread/unthread right pants leg, Pull pants up/down Non-skid slipper socks- Performed by patient: Don/doff right sock, Don/doff left sock Non-skid slipper socks- Performed by helper: Don/doff right sock Socks - Performed by patient: Don/doff right sock, Don/doff left sock Socks - Performed by helper: Don/doff right sock, Don/doff left sock Shoes - Performed by patient: Don/doff right shoe, Don/doff left shoe Shoes -  Performed by helper: Don/doff right shoe, Don/doff left shoe          Lower body assist Assist for lower body dressing: Touching or steadying assistance (Pt > 75%)      Toileting Toileting Toileting activity did not occur: No continent bowel/bladder event Toileting steps completed by patient: Adjust  clothing prior to toileting Toileting steps completed by helper: Performs perineal hygiene, Adjust clothing after toileting    Toileting assist Assist level: Touching or steadying assistance (Pt.75%)   Transfers Chair/bed transfer Chair/bed transfer activity did not occur: Safety/medical concerns Chair/bed transfer method: Stand pivot Chair/bed transfer assist level: Moderate assist (Pt 50 - 74%/lift or lower) Chair/bed transfer assistive device: Armrests     Locomotion Ambulation Ambulation activity did not occur: Safety/medical concerns   Max distance: 200 ft Assist level: Touching or steadying assistance (Pt > 75%)   Wheelchair Wheelchair activity did not occur: Safety/medical concerns   Max wheelchair distance: 20' Assist Level: Maximal assistance (Pt 25 - 49%) (BLE)  Cognition Comprehension Comprehension assist level: Understands basic 25 - 49% of the time/ requires cueing 50 - 75% of the time  Expression Expression assist level: Expresses basic 25 - 49% of the time/requires cueing 50 - 75% of the time. Uses single words/gestures.  Social Interaction Social Interaction assist level: Interacts appropriately 25 - 49% of time - Needs frequent redirection.  Problem Solving Problem solving assist level: Solves basic 25 - 49% of the time - needs direction more than half the time to initiate, plan or complete simple activities  Memory Memory assist level: Recognizes or recalls 25 - 49% of the time/requires cueing 50 - 75% of the time   Medical Problem List and Plan: 1.  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  DVT Prophylaxis/Anticoagulation: Mechanical: Sequential compression devices, below knee Bilateral lower extremities 3. Pain Management: Hydrocodone prn. Monitor for sedation.  4. Mood: [name] to follow for evaluation and support as mentation improves.  5. Neuropsych: This  patient is not capable of making decisions on his own behalf.  -  ritalin for attention/arousal/initiation-- 10mg --has helped 6. Skin/Wound Care: routine skin care 7. Fluids/Electrolytes/Nutrition: on clears  -continue TF as well but will decrease rate to 40/hr to see if stimulates app for solids  -k+ supp 8. Right displaced sacral and right pubic rami fracture: WBAT 9. Right clavicle fracture: NWB --to keep arm below 90 degrees and sling when out of bed. 10. Acute urinary retention:    toilet patient every 4 hours.   -ua neg, ucx neg also  -urecholine trial. Improving continence a whole 11. MSSA RLL PNA:  . Rocephin complete    -afebrile currently. No clinical signs of active disease  -pt on clear liquids now   -wbc's normal 12. Pre-renal azotemia: changed tube feed to Nephro. Continue H2O flushes   - BUN trending down to 23 on 5/31---down to 20 today 6/4  -clears per SLP 13. HTN: On metoprolol bid.  Fair control at present    LOS (Days) 16 A FACE TO FACE EVALUATION WAS PERFORMED  Ranelle OysterSWARTZ,Evolette Pendell T, MD 07/18/2016 9:24 AM

## 2016-07-19 ENCOUNTER — Inpatient Hospital Stay (HOSPITAL_COMMUNITY): Payer: Medicare HMO | Admitting: Occupational Therapy

## 2016-07-19 ENCOUNTER — Inpatient Hospital Stay (HOSPITAL_COMMUNITY): Payer: Medicare HMO | Admitting: Physical Therapy

## 2016-07-19 ENCOUNTER — Encounter (HOSPITAL_COMMUNITY): Payer: Self-pay | Admitting: *Deleted

## 2016-07-19 ENCOUNTER — Inpatient Hospital Stay (HOSPITAL_COMMUNITY): Payer: Medicare HMO | Admitting: Speech Pathology

## 2016-07-19 DIAGNOSIS — R7989 Other specified abnormal findings of blood chemistry: Secondary | ICD-10-CM

## 2016-07-19 DIAGNOSIS — R339 Retention of urine, unspecified: Secondary | ICD-10-CM

## 2016-07-19 LAB — GLUCOSE, CAPILLARY
GLUCOSE-CAPILLARY: 106 mg/dL — AB (ref 65–99)
GLUCOSE-CAPILLARY: 115 mg/dL — AB (ref 65–99)
Glucose-Capillary: 114 mg/dL — ABNORMAL HIGH (ref 65–99)
Glucose-Capillary: 116 mg/dL — ABNORMAL HIGH (ref 65–99)
Glucose-Capillary: 124 mg/dL — ABNORMAL HIGH (ref 65–99)
Glucose-Capillary: 162 mg/dL — ABNORMAL HIGH (ref 65–99)

## 2016-07-19 LAB — BASIC METABOLIC PANEL
ANION GAP: 9 (ref 5–15)
BUN: 18 mg/dL (ref 6–20)
CO2: 26 mmol/L (ref 22–32)
CREATININE: 0.84 mg/dL (ref 0.61–1.24)
Calcium: 8.9 mg/dL (ref 8.9–10.3)
Chloride: 100 mmol/L — ABNORMAL LOW (ref 101–111)
GFR calc non Af Amer: >60 mL/min (ref >60)
GLUCOSE: 109 mg/dL — AB (ref 65–99)
Potassium: 4.2 mmol/L (ref 3.5–5.1)
Sodium: 135 mmol/L (ref 135–145)

## 2016-07-19 NOTE — Progress Notes (Signed)
Occupational Therapy Session Note  Patient Details  Name: Tyler Lane MRN: [id number] Date of Birth: 12/31/1950  Today's Date: 07/19/2016 OT Individual Time: [id number] and 1300-1415 OT Individual Time Calculation (min): 73 min and 75 min    Short Term Goals: Week 2:  OT Short Term Goal 1 (Week 2): Pt will perform toilet transfer with min A. OT Short Term Goal 2 (Week 2): Pt will perform bathing at shower level with mod A and mod multimodal cues for routine task.   Skilled Therapeutic Interventions/Progress Updates:    Session 1: Upon entering the room, pt supine in bed and incontinent of urine. Pt exited the bed with min A and ambulated into bathroom for shower. Pt bathing with mod cues for initiation and sequence of task. Pt exiting the shower and seated on EOB to don clothing items with set up A for UB and steady assistance for LB dressing. Pt continues to need mod multimodal cues for sequencing. Pt put pants on backwards but unable to problem solve how to correct without assistance from therapist. OT providing supervision for breakfast with min verbal cues for swallowing strategies. Pt seated in wheelchair with quick release belt donned and placed at RN station for safety.  Session 2: Upon entering the room, pt seated on EOB with NT present in the room providing supervision for meal. Pt ambulates 15' with min HHA to bathroom for toileting. Pt unable to void but performs clothing management with steady assistance for balance. Pt requesting to go outside. OT propels pt outside via wheelchair. Pt oriented to time,location, situation, and this familiar therapist with mod cues this session. Pt returned to unit in same manner. Pt seated in wheelchair with quick release belt donned at RN station for safety.   Therapy Documentation Precautions:  Precautions Precautions: Fall Precaution Comments: R shoulder sling Other Brace/Splint: has cortrax feeding tube Restrictions Weight Bearing  Restrictions: Yes RUE Weight Bearing: Non weight bearing RLE Weight Bearing: Weight bearing as tolerated LLE Weight Bearing: Weight bearing as tolerated   Pain: Pain Assessment Pain Score: Asleep  See Function Navigator for Current Functional Status.   Therapy/Group: Individual Therapy  [name], Dallin Mccorkel P 07/19/2016, 8:32 AM

## 2016-07-19 NOTE — Progress Notes (Signed)
Social Work Patient ID: Tyler Lane, male   DOB: 03-04-1950, 66 y.o.   MRN: [id number]   Spoke with pt's daughter, Tyler Lane, to review team conference information.  Explained that team continues to plan for 6/19 d/c date and min assist goals overall.  Asked for daughter to confirm the plan for his care at d/c and she talks around option of pt coming to her home and his girlfriend assisting with his care then talks about wanting him to "go to another place first where he can get to where he can walk on his own."  After much discussion to clarify option of SNF, daughter states that she would like for me to pursue SNF placement "for just a couple of months."  I did clarify that, if insurance will approve SNF, that the LOS there is not guaranteed for any certain length of time.  Have left a message for insurance CM about change in d/c plan and will begin bed search.    Tyler Scotti, [name]

## 2016-07-19 NOTE — Progress Notes (Addendum)
Physical Therapy Session Note  Patient Details  Name: Tyler AskewDavid Rabalais MRN: [id number] Date of Birth: 1950-12-27  Today's Date: 07/19/2016 PT Individual Time: 1004-1059 PT Individual Time Calculation (min): 55 min   Short Term Goals: Week 3:  PT Short Term Goal 1 (Week 3): Pt will ambulate consistently 50 ft with min assist +1.  PT Short Term Goal 2 (Week 3): Pt will complete supine<>sit with min assist.   Skilled Therapeutic Interventions/Progress Updates:  Pt received in w/c & agreeable to tx. Session focused on functional mobility (transfers, gait, stair negotiation) and cognitive remediation. Pt negotiates 12 steps with L rail and min assist; therapist provides cuing for compensatory pattern but pt unable to follow and does well with weight bearing through RLE when descending stairs. Pt ambulates dayroom>room with min assist (LUE supported on therapist's shoulder); pt with RLE antalgic gait and fatigues quickly with task. During session therapist donned sling which helps pt maintain NWB RUE, otherwise pt without awareness and unable to follow cuing/instructions to maintain weight bearing precautions. Pt engaged in conversation with therapist and therapist oriented pt - pt continues to not be oriented to location or situation. Pt able to write name then copy shapes on white board but put unable to correctly identify simple shapes (heart, circle, triangle, square). At end of session pt left supine in bed with all needs within reach & bed alarm set.   Therapy Documentation Precautions:  Precautions Precautions: Fall Precaution Comments: R shoulder sling Other Brace/Splint: has cortrax feeding tube Restrictions Weight Bearing Restrictions: Yes RUE Weight Bearing: Non weight bearing RLE Weight Bearing: Weight bearing as tolerated LLE Weight Bearing: Weight bearing as tolerated  Pain: No c/o pain noted during session.   See Function Navigator for Current Functional  Status.   Therapy/Group: Individual Therapy  Sandi MariscalVictoria M Voyd Groft 07/19/2016, 12:36 PM

## 2016-07-19 NOTE — Progress Notes (Signed)
Speech Language Pathology Daily Session Note  Patient Details  Name: Tyler AskewDavid Sinning MRN: [id number] Date of Birth: April 27, 1950  Today's Date: 07/19/2016 SLP Individual Time: 0830-0930 SLP Individual Time Calculation (min): 60 min  Short Term Goals: Week 3: SLP Short Term Goal 1 (Week 3): Patient will consume trials of Dys. 1 textures without overt s/s of aspiration and Mod A verbal cues for swallow initiation in less than 20% of opportunities over 2 sessions prior to upgrade.  SLP Short Term Goal 2 (Week 3): Patient will follow commands in 75% of opportunities with Mod A verbal cues.  SLP Short Term Goal 3 (Week 3): Patient will initiate functional tasks within 1 minute with Max A verbal cues . SLP Short Term Goal 4 (Week 3): Patient will demonstrate sustained attention to a functional task for ~2 minutes with Max A multimodal cues.  SLP Short Term Goal 5 (Week 3): Patient will orient to place and situation with Max A verbal and visual cues.   Skilled Therapeutic Interventions: Skilled treatment session focused on cognitive goals. Patient declined trials of solid textures but consumed thin liquids without overt s/s of aspiration. Patient initiated a basic written expression task after 5 minutes and Max A multimodal cues and was independently oriented to place from a field of 3 and verbalized he had a "fall." Patient continues to demonstrate fatigue and lethargy and requires Max A multimodal cues to sustain attention to a task for ~60 seconds. Patient left upright in wheelchair at RN station. Continue with current plan of care.      Function:  Eating Eating   Modified Consistency Diet: Yes Eating Assist Level: Set up assist for;Supervision or verbal cues   Eating Set Up Assist For: Opening containers       Cognition Comprehension Comprehension assist level: Understands basic 50 - 74% of the time/ requires cueing 25 - 49% of the time  Expression   Expression assist level: Expresses basic  25 - 49% of the time/requires cueing 50 - 75% of the time. Uses single words/gestures.  Social Interaction Social Interaction assist level: Interacts appropriately 25 - 49% of time - Needs frequent redirection.  Problem Solving Problem solving assist level: Solves basic 25 - 49% of the time - needs direction more than half the time to initiate, plan or complete simple activities  Memory Memory assist level: Recognizes or recalls 25 - 49% of the time/requires cueing 50 - 75% of the time    Pain Pain Assessment Pain Score: 0-No pain  Therapy/Group: Individual Therapy  Tanisha Lutes 07/19/2016, 3:16 PM

## 2016-07-19 NOTE — Progress Notes (Signed)
[facility] PHYSICAL MEDICINE & REHABILITATION     PROGRESS NOTE    Subjective/Complaints: Up at nurses station. No new complaints. Appears comfortable  ROS: pt denies nausea, vomiting, diarrhea, cough, shortness of breath or chest pain      Objective: Vital Signs: Blood pressure (!) 108/93, pulse 65, temperature 98 F (36.7 C), temperature source Oral, resp. rate 17, weight 72.1 kg (159 lb), SpO2 97 %. No results found. No results for input(s): WBC, HGB, HCT, PLT in the last 72 hours.  Recent Labs  07/19/16 0557  NA 135  K 4.2  CL 100*  GLUCOSE 109*  BUN 18  CREATININE 0.84  CALCIUM 8.9   CBG (last 3)   Recent Labs  07/19/16 0022 07/19/16 0421 07/19/16 0833  GLUCAP 124* 116* 162*    Wt Readings from Last 3 Encounters:  07/19/16 72.1 kg (159 lb)  07/02/16 74.3 kg (163 lb 12.8 oz)    Physical Exam:  Constitutional: He appears well-developed and well-nourished. No distress    HENT:  Head: Normocephalic.  Right Ear: External ear normal.  Left Ear: External ear normal.  Mouth/Throat: Oropharynx is clear and moist.  Eyes: PERRL  Neck: supple  Cardiovascular: RRR Respiratory: cta b GI: soft nt, bs+ Musculoskeletal: right shoulder with improved movement. Neurological: He is alert.  Oriented to person only.  More attentive   Follows basic commands. Limited insight and awareness.  Motor: moves all 4.       Skin: Skin is warm and dry.  Psychiatric: cooperative   Assessment/Plan: 1. 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 Physiatrist is providing close team supervision and 24 hour management of active medical problems listed below. Physiatrist and rehab team continue to assess barriers to discharge/monitor patient progress toward functional and medical goals.  Function:  Bathing Bathing position Bathing activity did not occur:  Refused Position: Systems developerhower  Bathing parts Body parts bathed by patient: Right arm, Left arm, Chest, Abdomen, Front perineal area, Buttocks, Right upper leg, Left upper leg, Right lower leg, Left lower leg Body parts bathed by helper: Back  Bathing assist Assist Level: Touching or steadying assistance(Pt > 75%)      Upper Body Dressing/Undressing Upper body dressing   What is the patient wearing?: Pull over shirt/dress     Pull over shirt/dress - Perfomed by patient: Pull shirt over trunk Pull over shirt/dress - Perfomed by helper: Thread/unthread right sleeve, Thread/unthread left sleeve, Put head through opening        Upper body assist Assist Level: Touching or steadying assistance(Pt > 75%)   Set up : To obtain clothing/put away  Lower Body Dressing/Undressing Lower body dressing   What is the patient wearing?: Pants, Socks Underwear - Performed by patient: Thread/unthread right underwear leg, Thread/unthread left underwear leg, Pull underwear up/down   Pants- Performed by patient: Thread/unthread right pants leg, Thread/unthread left pants leg, Pull pants up/down Pants- Performed by helper: Thread/unthread right pants leg, Pull pants up/down Non-skid slipper socks- Performed by patient: Don/doff right sock, Don/doff left sock Non-skid slipper socks- Performed by helper: Don/doff right sock Socks - Performed by patient: Don/doff right sock, Don/doff left sock Socks - Performed by helper: Don/doff right sock, Don/doff left sock Shoes - Performed by patient: Don/doff right shoe, Don/doff left shoe Shoes - Performed by helper: Don/doff right shoe, Don/doff left shoe          Lower body assist  Assist for lower body dressing: Touching or steadying assistance (Pt > 75%)      Toileting Toileting Toileting activity did not occur: No continent bowel/bladder event Toileting steps completed by patient: Adjust clothing prior to toileting, Adjust clothing after toileting, Performs  perineal hygiene Toileting steps completed by helper: Performs perineal hygiene, Adjust clothing after toileting    Toileting assist Assist level: Touching or steadying assistance (Pt.75%)   Transfers Chair/bed transfer Chair/bed transfer activity did not occur: Safety/medical concerns Chair/bed transfer method: Ambulatory Chair/bed transfer assist level: Touching or steadying assistance (Pt > 75%) Chair/bed transfer assistive device:  (HHA)     Locomotion Ambulation Ambulation activity did not occur: Safety/medical concerns   Max distance: 200 ft Assist level: Touching or steadying assistance (Pt > 75%)   Wheelchair Wheelchair activity did not occur: Safety/medical concerns   Max wheelchair distance: 20' Assist Level: Maximal assistance (Pt 25 - 49%) (BLE)  Cognition Comprehension Comprehension assist level: Understands basic 50 - 74% of the time/ requires cueing 25 - 49% of the time  Expression Expression assist level: Expresses basic 25 - 49% of the time/requires cueing 50 - 75% of the time. Uses single words/gestures.  Social Interaction Social Interaction assist level: Interacts appropriately 25 - 49% of time - Needs frequent redirection.  Problem Solving Problem solving assist level: Solves basic 25 - 49% of the time - needs direction more than half the time to initiate, plan or complete simple activities  Memory Memory assist level: Recognizes or recalls 25 - 49% of the time/requires cueing 50 - 75% of the time   Medical Problem List and Plan: 1.  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  DVT Prophylaxis/Anticoagulation: Mechanical: Sequential compression devices, below knee Bilateral lower extremities 3. Pain Management: Hydrocodone prn. Monitor for sedation.  4. Mood: [name] to follow for evaluation and support as mentation improves.  5. Neuropsych: This patient is not capable of making decisions  on his own behalf.  -  ritalin for attention/arousal/initiation--continue at 10mg   6. Skin/Wound Care: routine skin care 7. Fluids/Electrolytes/Nutrition: on clears  -continue TF as well but will decreased rate to 40/hr to see if stimulates app for solids  -k+ supp 8. Right displaced sacral and right pubic rami fracture: WBAT 9. Right clavicle fracture: NWB --to keep arm below 90 degrees and sling when out of bed. 10. Acute urinary retention:    toilet patient every 4 hours.   -ua neg, ucx neg also  -urecholine trial. Improving continence a whole 11. MSSA RLL PNA:  . Rocephin complete    -afebrile currently. No clinical signs of active disease  -pt on clear liquids now   -wbc's normal 12. Pre-renal azotemia:    Continue H2O flushes   - BUN trending down. 18 today 6/7  -clears per SLP 13. HTN: On metoprolol bid.  Fair control at present    LOS (Days) 17 A FACE TO FACE EVALUATION WAS PERFORMED  Ranelle Oyster, MD 07/19/2016 9:22 AM

## 2016-07-20 ENCOUNTER — Inpatient Hospital Stay (HOSPITAL_COMMUNITY): Payer: Medicare HMO | Admitting: Physical Therapy

## 2016-07-20 ENCOUNTER — Inpatient Hospital Stay (HOSPITAL_COMMUNITY): Payer: Medicare HMO | Admitting: Occupational Therapy

## 2016-07-20 ENCOUNTER — Inpatient Hospital Stay (HOSPITAL_COMMUNITY): Payer: Medicare HMO

## 2016-07-20 ENCOUNTER — Inpatient Hospital Stay (HOSPITAL_COMMUNITY): Payer: Medicare HMO | Admitting: Speech Pathology

## 2016-07-20 LAB — GLUCOSE, CAPILLARY
GLUCOSE-CAPILLARY: 112 mg/dL — AB (ref 65–99)
GLUCOSE-CAPILLARY: 118 mg/dL — AB (ref 65–99)
GLUCOSE-CAPILLARY: 128 mg/dL — AB (ref 65–99)
GLUCOSE-CAPILLARY: 98 mg/dL (ref 65–99)
Glucose-Capillary: 120 mg/dL — ABNORMAL HIGH (ref 65–99)
Glucose-Capillary: 113 mg/dL — ABNORMAL HIGH (ref 65–99)
Glucose-Capillary: 133 mg/dL — ABNORMAL HIGH (ref 65–99)

## 2016-07-20 NOTE — Progress Notes (Signed)
Physical Therapy Session Note  Patient Details  Name: Tyler AskewDavid Springston MRN: [id number] Date of Birth: March 30, 1950  Today's Date: 07/20/2016 PT Individual Time: 0901-1001 and [id number] PT Individual Time Calculation (min): 60 min and 25 min  Short Term Goals: Week 3:  PT Short Term Goal 1 (Week 3): Pt will ambulate consistently 50 ft with min assist +1.  PT Short Term Goal 2 (Week 3): Pt will complete supine<>sit with min assist.   Skilled Therapeutic Interventions/Progress Updates:  Treatment 1: Pt received in handoff from OT; pt agreeable to tx & without c/o pain during session. Session focused on cognitive remediation and functional mobility.  Pt continues to not be oriented to [place] of hospital) or situation, is unable to recall names of children, and demonstrates impaired intellectual awareness. Pt ambulates gym<>ortho gym with min assist (LUE on therapist's shoulder) and antalgic gait RLE. Pt completes car transfer with min assist and ambulates over ramp and uneven surface with min assist. Pt continues to fatigue quickly and require frequent rest breaks. Pt utilized nu-step on level 2 x 5 minutes (BLE only) with task focusing on BLE strengthening, endurance training, and attention to task. Pt requires max cuing for continued engagement and attention to task. At end of session pt left sitting in w/c with QRB donned & at nurses station.    Treatment 2: Pt received in w/c at nurses station. During session pt reported fatigue & pain in right "hip socket" - RN made aware but reports pt is premedicated. Pt ambulated room<>dayroom with HHA (LUE over therapist's shoulder) and min assist 2/2 antalgic gait on R. Pt engaged in cognitive task consisting of identifying foods on bean bags, sorting them into groups by food type, distinguishing fruit vs vegetable, and recalling appropriate meal time to consume food. Pt able to correctly distinguish fruit vs vegetable 75%  of the time but required max assist to sort bean bags by group. During session pt oriented to current month & year without assistance. At end of session pt left sitting in w/c with QRB donned & at nurses station.   Therapy Documentation Precautions:  Precautions Precautions: Fall Precaution Comments: R shoulder sling Other Brace/Splint: has cortrax feeding tube Restrictions Weight Bearing Restrictions: Yes RUE Weight Bearing: Non weight bearing RLE Weight Bearing: Weight bearing as tolerated LLE Weight Bearing: Weight bearing as tolerated    See Function Navigator for Current Functional Status.   Therapy/Group: Individual Therapy  Sandi MariscalVictoria M Lyvonne Cassell 07/20/2016, 6:08 PM

## 2016-07-20 NOTE — Progress Notes (Signed)
Speech Language Pathology Daily Session Note  Patient Details  Name: Tyler Lane MRN: [id number] Date of Birth: Feb 05, 1951  Today's Date: 07/20/2016 SLP Individual Time: 1535-1600 SLP Individual Time Calculation (min): 25 min  Short Term Goals: Week 3: SLP Short Term Goal 1 (Week 3): Patient will consume trials of Dys. 1 textures without overt s/s of aspiration and Mod A verbal cues for swallow initiation in less than 20% of opportunities over 2 sessions prior to upgrade.  SLP Short Term Goal 2 (Week 3): Patient will follow commands in 75% of opportunities with Mod A verbal cues.  SLP Short Term Goal 3 (Week 3): Patient will initiate functional tasks within 1 minute with Max A verbal cues . SLP Short Term Goal 4 (Week 3): Patient will demonstrate sustained attention to a functional task for ~2 minutes with Max A multimodal cues.  SLP Short Term Goal 5 (Week 3): Patient will orient to place and situation with Max A verbal and visual cues.   Skilled Therapeutic Interventions:  Pt was seen for skilled ST targeting cognitive goals.  Pt was able to orient to date with mod-max assist verbal and visual cues for use of external aids.  Therapist facilitated the session with a novel card game to address task initiation and attention to task.  Pt sustained his attention to task for ~2 minute intervals with max assist multimodal cues for redirection.  Pt also needed max assist verbal cues for task initiation as well as for basic functional problem solving.  Initiation of verbal expression was much improved today in comparison to previous therapy sessions.    Pt was left in wheelchair at nursing station with quick release belt donned.  Continue per current plan of care.    Function:  Eating Eating   Modified Consistency Diet: Yes Eating Assist Level: Set up assist for;Supervision or verbal cues;Helper performs IV, parenteral or tube feed   Eating Set Up Assist For: Opening containers;Parenteral or tube  feed supplies       Cognition Comprehension Comprehension assist level: Understands basic 75 - 89% of the time/ requires cueing 10 - 24% of the time  Expression   Expression assist level: Expresses basic 50 - 74% of the time/requires cueing 25 - 49% of the time. Needs to repeat parts of sentences.  Social Interaction Social Interaction assist level: Interacts appropriately 50 - 74% of the time - May be physically or verbally inappropriate.  Problem Solving Problem solving assist level: Solves basic 25 - 49% of the time - needs direction more than half the time to initiate, plan or complete simple activities  Memory Memory assist level: Recognizes or recalls 25 - 49% of the time/requires cueing 50 - 75% of the time    Pain Pain Assessment Pain Assessment: No/denies pain Pain Score: 0-No pain  Therapy/Group: Individual Therapy  Tyler Lane, Tyler SpryNicole Lane 07/20/2016, 4:21 PM

## 2016-07-20 NOTE — Plan of Care (Signed)
Problem: RH BLADDER ELIMINATION Goal: RH STG MANAGE BLADDER WITH ASSISTANCE Patient will be able to manage with minimal -assist  Outcome: Not Progressing Patient has not been going at night when urinal offered but goes at unpredictable intervals when it strikes him to go. He pulls his brief down and urinates into the air. Not able to limit fluids HS much r/t feeding and flushes and meds.

## 2016-07-20 NOTE — Progress Notes (Signed)
Occupational Therapy Session Note  Patient Details  Name: Tyler AskewDavid Boies MRN: [id number] Date of Birth: 07/31/1950  Today's Date: 07/20/2016 OT Individual Time: 0800-0900 OT Individual Time Calculation (min): 60 min    Short Term Goals: Week 2:  OT Short Term Goal 1 (Week 2): Pt will perform toilet transfer with min A. OT Short Term Goal 2 (Week 2): Pt will perform bathing at shower level with mod A and mod multimodal cues for routine task.   Skilled Therapeutic Interventions/Progress Updates:    Pt presented supine in bed, sleeping, agreeable to OT treatment session. Focus of session on ADL completion and cognitive skills development. Pt completed bed mobility with ModA and requiring increased time, verbal and tactile cues to initiate task. Pt completed toileting with ModA for toilet transfer, MinA to steady during clothing management. Completed room level functional mobility with HHA to sit EOB. Pt completing UB/LB dressing at EOB with setup and verbal cues, requires increased time to complete donning overhead shirt and close guard for safety when standing to pull up underwear/pants. Completed stand pivot transfer to w/c with MinA to complete seated grooming ADLs at sink with set up and verbal cues for initiating each grooming task.  Pt participated looking at pictures of family members in room, identifying names and relationship with verbal cues, able to identify 50% of family correctly, and identifying additional family members when given 2 options. Oriented Pt to current date, day, month and year using external visual aides. Pt left sitting up in w/c, PT arriving to being PT session.   Therapy Documentation Precautions:  Precautions Precautions: Fall Precaution Comments: R shoulder sling Other Brace/Splint: has cortrax feeding tube Restrictions Weight Bearing Restrictions: Yes RUE Weight Bearing: Non weight bearing RLE Weight Bearing: Weight bearing as tolerated LLE Weight Bearing:  Weight bearing as tolerated    Pain: Pain Assessment Pain Assessment: Faces Pain Score:  Faces Pain Scale: No hurt  See Function Navigator for Current Functional Status.   Therapy/Group: Individual Therapy  [name] 07/20/2016, 12:19 PM

## 2016-07-20 NOTE — Progress Notes (Signed)
Occupational Therapy Session Note  Patient Details  Name: Tyler AskewDavid Lane MRN: [id number] Date of Birth: May 01, 1950  Today's Date: 07/20/2016 OT Individual Time: [id number] OT Individual Time Calculation (min): 41 min    Short Term Goals: Week 2:  OT Short Term Goal 1 (Week 2): Pt will perform toilet transfer with min A. OT Short Term Goal 2 (Week 2): Pt will perform bathing at shower level with mod A and mod multimodal cues for routine task.   Skilled Therapeutic Interventions/Progress Updates:    Treatment session with focus on functional mobility and increased participation in structured tasks.  Pt received seated EOB with RN present.  Pt donned shoes with increased time but only initial cue.  Ambulated >100 feet with HHA and w/c follow for safety.  Engaged in [employer] in sitting with focus on initiation and sustained attention to task.  In 60 second time limit, pt able to select 6, 18, and 12 lights with max faded to min cues.  Attempted increased challenge with use of red and green lights with pt unable to select any lights with added challenge.  Modified task to having pt call out colors when they were illuminated with pt successfully identifying 6/10 red and 0/3 green.  Engaged in pipe tree activity with therapist selecting "field goal" and pt initiating activity however completing "cross" despite cues to replicate picture.  Pt unable to problem solve to correct or even start over, selecting to terminate task.  Left upright in w/c with quick release belt at RN station for safety.  Therapy Documentation Precautions:  Precautions Precautions: Fall Precaution Comments: R shoulder sling Other Brace/Splint: has cortrax feeding tube Restrictions Weight Bearing Restrictions: Yes RUE Weight Bearing: Non weight bearing RLE Weight Bearing: Weight bearing as tolerated LLE Weight Bearing: Weight bearing as tolerated General:   Vital Signs: Therapy Vitals Temp: 97.8 F (36.6 C) Temp  Source: Oral Pulse Rate: 65 Resp: 18 BP: 112/72 Patient Position (if appropriate): Sitting Oxygen Therapy SpO2: 97 % O2 Device: Not Delivered Pain: Pain Assessment Pain Assessment: Faces Pain Score: 0-No pain Faces Pain Scale: No hurt  See Function Navigator for Current Functional Status.   Therapy/Group: Individual Therapy  Rosalio LoudHOXIE, Toniqua Melamed 07/20/2016, 3:33 PM

## 2016-07-20 NOTE — Progress Notes (Signed)
[facility] PHYSICAL MEDICINE & REHABILITATION     PROGRESS NOTE    Subjective/Complaints: Lying in bed. No complaints. Denies pain  ROS: pt denies nausea, vomiting, diarrhea, cough, shortness of breath or chest pain       Objective: Vital Signs: Blood pressure 126/83, pulse 77, temperature 98.8 F (37.1 C), temperature source Oral, resp. rate 18, weight 73.4 kg (161 lb 12.8 oz), SpO2 97 %. No results found. No results for input(s): WBC, HGB, HCT, PLT in the last 72 hours.  Recent Labs  07/19/16 0557  NA 135  K 4.2  CL 100*  GLUCOSE 109*  BUN 18  CREATININE 0.84  CALCIUM 8.9   CBG (last 3)   Recent Labs  07/20/16 0127 07/20/16 0414 07/20/16 0849  GLUCAP 120* 118* 113*    Wt Readings from Last 3 Encounters:  07/20/16 73.4 kg (161 lb 12.8 oz)  07/02/16 74.3 kg (163 lb 12.8 oz)    Physical Exam:  Constitutional: He appears well-developed and well-nourished. No distress    HENT:  Head: Normocephalic.  Right Ear: External ear normal.  Left Ear: External ear normal.  Mouth/Throat: Oropharynx is clear and moist.  Eyes: PERRL  Neck: supple  Cardiovascular: RRR Respiratory: CTA B GI: NT, BS+ Musculoskeletal: right shoulder with improved movement. Neurological: He is alert.  Oriented to person. Can't remember location even with cueing.  More attentive   Follows basic commands. Limited insight and awareness.  Motor: moves all 4 except for some limitations in right shoulder.       Skin: Skin is warm and dry.  Psychiatric: cooperative   Assessment/Plan: 1. 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 Physiatrist is providing close team supervision and 24 hour management of active medical problems listed below. Physiatrist and rehab team continue to assess barriers to discharge/monitor patient progress toward functional and medical  goals.  Function:  Bathing Bathing position Bathing activity did not occur: Refused Position: Systems developerhower  Bathing parts Body parts bathed by patient: Right arm, Left arm, Chest, Abdomen, Front perineal area, Buttocks, Right upper leg, Left upper leg, Right lower leg, Left lower leg Body parts bathed by helper: Back  Bathing assist Assist Level: Touching or steadying assistance(Pt > 75%)      Upper Body Dressing/Undressing Upper body dressing   What is the patient wearing?: Pull over shirt/dress     Pull over shirt/dress - Perfomed by patient: Pull shirt over trunk Pull over shirt/dress - Perfomed by helper: Thread/unthread right sleeve, Thread/unthread left sleeve, Put head through opening, Pull shirt over trunk        Upper body assist Assist Level: Supervision or verbal cues, Set up   Set up : To obtain clothing/put away  Lower Body Dressing/Undressing Lower body dressing   What is the patient wearing?: Pants, Underwear, Non-skid slipper socks Underwear - Performed by patient: Thread/unthread right underwear leg, Thread/unthread left underwear leg, Pull underwear up/down   Pants- Performed by patient: Thread/unthread right pants leg, Thread/unthread left pants leg, Pull pants up/down Pants- Performed by helper: Thread/unthread right pants leg, Pull pants up/down Non-skid slipper socks- Performed by patient: Don/doff right sock, Don/doff left sock Non-skid slipper socks- Performed by helper: Don/doff right sock Socks - Performed by patient: Don/doff right sock, Don/doff left sock Socks - Performed by helper: Don/doff right sock, Don/doff left sock Shoes - Performed by patient: Don/doff right shoe, Don/doff left shoe Shoes - Performed  by helper: Don/doff right shoe, Don/doff left shoe          Lower body assist Assist for lower body dressing: Touching or steadying assistance (Pt > 75%)      Toileting Toileting Toileting activity did not occur: No continent bowel/bladder  event Toileting steps completed by patient: Adjust clothing prior to toileting, Adjust clothing after toileting, Performs perineal hygiene Toileting steps completed by helper: Performs perineal hygiene, Adjust clothing after toileting    Toileting assist Assist level: Touching or steadying assistance (Pt.75%)   Transfers Chair/bed transfer Chair/bed transfer activity did not occur: Safety/medical concerns Chair/bed transfer method: Ambulatory (Simultaneous filing. User may not have seen previous data.) Chair/bed transfer assist level: Touching or steadying assistance (Pt > 75%) (Simultaneous filing. User may not have seen previous data.) Chair/bed transfer assistive device:  (HHA - LUE supported on therapist's shoulder Simultaneous filing. User may not have seen previous data.)     Locomotion Ambulation Ambulation activity did not occur: Safety/medical concerns   Max distance: 150 ft (Simultaneous filing. User may not have seen previous data.) Assist level: Touching or steadying assistance (Pt > 75%) (Simultaneous filing. User may not have seen previous data.)   Wheelchair Wheelchair activity did not occur: Safety/medical concerns   Max wheelchair distance: 20' Assist Level: Maximal assistance (Pt 25 - 49%) (BLE)  Cognition Comprehension Comprehension assist level: Understands basic 50 - 74% of the time/ requires cueing 25 - 49% of the time  Expression Expression assist level: Expresses basic 25 - 49% of the time/requires cueing 50 - 75% of the time. Uses single words/gestures.  Social Interaction Social Interaction assist level: Interacts appropriately 25 - 49% of time - Needs frequent redirection.  Problem Solving Problem solving assist level: Solves basic 25 - 49% of the time - needs direction more than half the time to initiate, plan or complete simple activities  Memory Memory assist level: Recognizes or recalls 25 - 49% of the time/requires cueing 50 - 75% of the time   Medical  Problem List and Plan: 1.  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  DVT Prophylaxis/Anticoagulation: Mechanical: Sequential compression devices, below knee Bilateral lower extremities 3. Pain Management: Hydrocodone prn. Monitor for sedation.  4. Mood: [name] to follow for evaluation and support as mentation improves.  5. Neuropsych: This patient is not capable of making decisions on his own behalf.  -  ritalin for attention/arousal/initiation--continue at 10mg    -sleep still al little inconsistent 6. Skin/Wound Care: routine skin care 7. Fluids/Electrolytes/Nutrition: on clears per SLP---takes these in vigorously  -continue TF as well but have decreased rate to 40/hr to see if stimulates app for solids  -k+ supp  -BUN trending down---follow up labs monday 8. Right displaced sacral and right pubic rami fracture: WBAT 9. Right clavicle fracture: NWB --to keep arm below 90 degrees and sling when out of bed. 10. Acute urinary retention:    toilet patient every 4 hours.   -ua neg, ucx neg also  -urecholine trial. Improving continence a whole 11. MSSA RLL PNA:  . Rocephin complete    -afebrile currently. No clinical signs of active disease  -pt on clear liquids now   -wbc's normal 13. HTN: On metoprolol bid.  Fair control at present    LOS (Days) 18 A FACE TO FACE EVALUATION WAS PERFORMED  Faith Rogue T, MD 07/20/2016 9:05 AM

## 2016-07-20 NOTE — Progress Notes (Signed)
Occupational Therapy Session Note  Patient Details  Name: Tyler AskewDavid Lane MRN: [id number] Date of Birth: [birth year]  Today's Date: 07/20/2016 OT Individual Time: 7829-56211103-1207 OT Individual Time Calculation (min): 64 min    Short Term Goals: Week 2:  OT Short Term Goal 1 (Week 2): Pt will perform toilet transfer with min A. OT Short Term Goal 2 (Week 2): Pt will perform bathing at shower level with mod A and mod multimodal cues for routine task.   Skilled Therapeutic Interventions/Progress Updates:    Tx focus on cognitive remediation, standing balance, and functional ambulation during self care completion.   Pt greeted at RN station, requesting to see his "appointment list." Pt returned to room and reviewed his morning therapies/afternoon therapies. Pt was then agreeable to shower "I would like a shower." Pt ambulated to toilet first to attempt voiding (with Min A). Pt with limp during ambulation; he reported his left leg hurt. Pt urinating within 30 seconds of transfer. He then transitioned to shower bench. Pt requiring mod vcs overall for sequencing and attention. He then ambulated to EOB to proceed with dressing. Pt initiating donning socks/shoes. Able to correct himself when he began donning left shoe on the right foot. Pt requiring mod cues overall for attention/awareness (i.e. Pt sitting EOB without pants, reporting he was completely dressed). Pt completed grooming tasks w/c level at sink. Engaged him in orientation conversation, pt very tired at this point. Required cues for orientation to place, pt able to recall year and his full name. Pt transferred back to bed at end of tx (per RN request). Pt left with all needs within reach and bed alarm activated at time of departure.   Pt also reported feeling very cold. RN made aware of this and of his leg pain.  Therapy Documentation Precautions:  Precautions Precautions: Fall Precaution Comments: R shoulder sling Other Brace/Splint: has cortrax  feeding tube Restrictions Weight Bearing Restrictions: Yes RUE Weight Bearing: Non weight bearing RLE Weight Bearing: Weight bearing as tolerated LLE Weight Bearing: Weight bearing as tolerated   Pain: Pain Assessment Pain Assessment: Faces Pain Score: 3  Faces Pain Scale: No hurt ADL:      See Function Navigator for Current Functional Status.   Therapy/Group: Individual Therapy  Cope Marte A Missey Hasley 07/20/2016, 12:29 PM

## 2016-07-21 ENCOUNTER — Inpatient Hospital Stay (HOSPITAL_COMMUNITY): Payer: Medicare HMO | Admitting: Occupational Therapy

## 2016-07-21 ENCOUNTER — Inpatient Hospital Stay (HOSPITAL_COMMUNITY): Payer: Medicare HMO | Admitting: Speech Pathology

## 2016-07-21 DIAGNOSIS — G479 Sleep disorder, unspecified: Secondary | ICD-10-CM

## 2016-07-21 DIAGNOSIS — R739 Hyperglycemia, unspecified: Secondary | ICD-10-CM

## 2016-07-21 DIAGNOSIS — I10 Essential (primary) hypertension: Secondary | ICD-10-CM

## 2016-07-21 DIAGNOSIS — R1310 Dysphagia, unspecified: Secondary | ICD-10-CM

## 2016-07-21 DIAGNOSIS — S062X3S Diffuse traumatic brain injury with loss of consciousness of 1 hour to 5 hours 59 minutes, sequela: Secondary | ICD-10-CM

## 2016-07-21 DIAGNOSIS — S42034S Nondisplaced fracture of lateral end of right clavicle, sequela: Secondary | ICD-10-CM

## 2016-07-21 LAB — GLUCOSE, CAPILLARY
Glucose-Capillary: 110 mg/dL — ABNORMAL HIGH (ref 65–99)
Glucose-Capillary: 95 mg/dL (ref 65–99)
Glucose-Capillary: 131 mg/dL — ABNORMAL HIGH (ref 65–99)
Glucose-Capillary: 100 mg/dL — ABNORMAL HIGH (ref 65–99)
Glucose-Capillary: 96 mg/dL (ref 65–99)

## 2016-07-21 NOTE — Progress Notes (Signed)
Occupational Therapy Session Note  Patient Details  Name: Tyler Lane MRN: [id number] Date of Birth: [birth year]  Today's Date: 07/21/2016 OT Individual Time: 0277-4128 OT Individual Time Calculation (min): 62 min    Short Term Goals: Week 1:  OT Short Term Goal 1 (Week 1): Pt will perform UB dressing with mod A to decrease level of assistance with self care.  OT Short Term Goal 1 - Progress (Week 1): Met OT Short Term Goal 2 (Week 1): Pt will sit EOB to wash UB with min A for dynamic sitting balance. OT Short Term Goal 2 - Progress (Week 1): Met OT Short Term Goal 3 (Week 1): Pt will stand with mod A stand balance for LB clothing management. OT Short Term Goal 3 - Progress (Week 1): Met Week 2:  OT Short Term Goal 1 (Week 2): Pt will perform toilet transfer with min A. OT Short Term Goal 2 (Week 2): Pt will perform bathing at shower level with mod A and mod multimodal cues for routine task.   Skilled Therapeutic Interventions/Progress Updates:    Tx focus on attention and awareness during leisure task completion.   Pt greeted at [employer] station. He was escorted to dayroom and had him engage in bowling activity. Pt requiring tactile cues to initiate 75% of  In moderately stimulating environment. Attention improved when turn taking was involved, pt handing ball to therapist during her turn 3/3 occasions when ball rolled back to him. Pt verbalizing favorite band names for music listening with therapy ipad. Pt selecting [employer] (able to accurately spell name aloud) and [employer]. Pt verbalizing favorite songs "She Came in Through the [employer]" (a Control and instrumentation engineer) and "Imagine" from [employer]. Pt then verbalized that he wanted to look outside. Sit<stand with Min A at [place], pt identifying that he saw cars outside. Pt then attempting to sit on windowsill, required cuing to transition back to recliner. Pt then escorted to RN station with safety belt donned. Pt left with NT at time of  departure.   Therapy Documentation Precautions:  Precautions Precautions: Fall Precaution Comments: R shoulder sling Other Brace/Splint: has cortrax feeding tube Restrictions Weight Bearing Restrictions: Yes RUE Weight Bearing: Non weight bearing RLE Weight Bearing: Weight bearing as tolerated LLE Weight Bearing: Weight bearing as tolerated   Vital Signs: Therapy Vitals Temp: 97.7 F (36.5 C) Temp Source: Oral Pulse Rate: 64 Resp: 16 BP: 122/85 Patient Position (if appropriate): Sitting Oxygen Therapy SpO2: 98 % O2 Device: Not Delivered Pain:  Pain Assessment Pain Assessment: No/denies pain ADL:  :    See Function Navigator for Current Functional Status.   Therapy/Group: Individual Therapy  Tyler Lane 07/21/2016, 4:49 PM

## 2016-07-21 NOTE — Progress Notes (Signed)
[place] PHYSICAL MEDICINE & REHABILITATION     PROGRESS NOTE    Subjective/Complaints: Pt seen laying in bed this AM.  He slept well overnight per sleep chart.    ROS: Denies CP, SOB, N/V/D,?reliability.  Objective: Vital Signs: Blood pressure 112/83, pulse 72, temperature 99.1 F (37.3 C), temperature source Oral, resp. rate 16, weight 72.4 kg (159 lb 9.6 oz), SpO2 98 %. No results found. No results for input(s): WBC, HGB, HCT, PLT in the last 72 hours.  Recent Labs  07/19/16 0557  NA 135  K 4.2  CL 100*  GLUCOSE 109*  BUN 18  CREATININE 0.84  CALCIUM 8.9   CBG (last 3)   Recent Labs  07/20/16 2339 07/21/16 0344 07/21/16 0815  GLUCAP 128* 110* 95    Wt Readings from Last 3 Encounters:  07/21/16 72.4 kg (159 lb 9.6 oz)  07/02/16 74.3 kg (163 lb 12.8 oz)    Physical Exam:  Constitutional: He appears well-developed and well-nourished. NAD  HENT: Normocephalic. Atraumatic. +NG Eyes: EOMI. No discharge.  Cardiovascular: RRR. No JVD. Respiratory: CTA B. Unlabored. GI: ND, BS+ Musculoskeletal: No edema, no tenderness Neurological: He is alert and oriented to person only.  Follows basic commands. Limited insight and awareness. Motor: Moving all 4 extremities Skin: Skin is warm and dry.  Psychiatric: cooperative   Assessment/Plan: 1. 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 Physiatrist is providing close team supervision and 24 hour management of active medical problems listed below. Physiatrist and rehab team continue to assess barriers to discharge/monitor patient progress toward functional and medical goals.  Function:  Bathing Bathing position Bathing activity did not occur: Refused Position: Systems developer parts bathed by patient: Right arm, Left arm, Chest, Abdomen, Front perineal area, Buttocks, Right upper leg, Left upper  leg, Right lower leg, Left lower leg Body parts bathed by helper: Back  Bathing assist Assist Level: Touching or steadying assistance(Pt > 75%)      Upper Body Dressing/Undressing Upper body dressing   What is the patient wearing?: Pull over shirt/dress     Pull over shirt/dress - Perfomed by patient: Thread/unthread right sleeve, Thread/unthread left sleeve, Put head through opening, Pull shirt over trunk Pull over shirt/dress - Perfomed by helper: Thread/unthread right sleeve, Thread/unthread left sleeve, Put head through opening, Pull shirt over trunk        Upper body assist Assist Level: Supervision or verbal cues   Set up : To obtain clothing/put away  Lower Body Dressing/Undressing Lower body dressing   What is the patient wearing?: Pants, Socks, Shoes Underwear - Performed by patient: Thread/unthread right underwear leg, Thread/unthread left underwear leg, Pull underwear up/down   Pants- Performed by patient: Thread/unthread right pants leg, Thread/unthread left pants leg, Pull pants up/down Pants- Performed by helper: Thread/unthread right pants leg, Pull pants up/down Non-skid slipper socks- Performed by patient: Don/doff right sock, Don/doff left sock Non-skid slipper socks- Performed by helper: Don/doff right sock Socks - Performed by patient: Don/doff right sock, Don/doff left sock Socks - Performed by helper: Don/doff right sock, Don/doff left sock Shoes - Performed by patient: Don/doff right shoe, Don/doff left shoe Shoes - Performed by helper: Don/doff right shoe, Don/doff left shoe          Lower body assist Assist for lower body dressing: Touching or steadying assistance (Pt > 75%)      Toileting Toileting Toileting activity  did not occur: No continent bowel/bladder event Toileting steps completed by patient: Adjust clothing prior to toileting, Performs perineal hygiene Toileting steps completed by helper: Performs perineal hygiene, Adjust clothing after  toileting Toileting Assistive Devices: Grab bar or rail  Toileting assist Assist level: Touching or steadying assistance (Pt.75%)   Transfers Chair/bed transfer Chair/bed transfer activity did not occur: Safety/medical concerns Chair/bed transfer method: Ambulatory Chair/bed transfer assist level: Touching or steadying assistance (Pt > 75%) Chair/bed transfer assistive device: Armrests     Locomotion Ambulation Ambulation activity did not occur: Safety/medical concerns   Max distance: 80 ft Assist level: Touching or steadying assistance (Pt > 75%)   Wheelchair Wheelchair activity did not occur: Safety/medical concerns   Max wheelchair distance: 20' Assist Level: Maximal assistance (Pt 25 - 49%) (BLE)  Cognition Comprehension Comprehension assist level: Understands basic 75 - 89% of the time/ requires cueing 10 - 24% of the time  Expression Expression assist level: Expresses basic 50 - 74% of the time/requires cueing 25 - 49% of the time. Needs to repeat parts of sentences.  Social Interaction Social Interaction assist level: Interacts appropriately 50 - 74% of the time - May be physically or verbally inappropriate.  Problem Solving Problem solving assist level: Solves basic 25 - 49% of the time - needs direction more than half the time to initiate, plan or complete simple activities  Memory Memory assist level: Recognizes or recalls 25 - 49% of the time/requires cueing 50 - 75% of the time   Medical Problem List and Plan: 1.  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  DVT Prophylaxis/Anticoagulation: Mechanical: Sequential compression devices, below knee Bilateral lower extremities 3. Pain Management: Hydrocodone prn. Monitor for sedation.  4. Mood: [name] to follow for evaluation and support as mentation improves.  5. Neuropsych: This patient is not capable of making decisions on his own  behalf.  - ritalin for attention/arousal/initiation--continue at 10mg    -sleep improving 6. Skin/Wound Care: routine skin care 7. Fluids/Electrolytes/Nutrition: on clears per SLP---takes these in vigorously  -continue TF as well but have decreased rate to 40/hr to see if stimulates app for solids  -k+ supp  -BUN trending down---follow up labs monday 8. Right displaced sacral and right pubic rami fracture: WBAT 9. Right clavicle fracture: NWB --to keep arm below 90 degrees and sling when out of bed. 10. Acute urinary retention:    toilet patient every 4 hours.   -ua neg, ucx neg also  -urecholine trial. Improving continence a whole 11. MSSA RLL PNA:  . Rocephin complete    -remains afebrile. No clinical signs of active disease  -pt on clear liquids now   -wbc's normal 13. HTN: On metoprolol bid.    Controlled 6/9 14. Hyperglycemia  Related to tube feeds  Cont to monitor    LOS (Days) 19 A FACE TO FACE EVALUATION WAS PERFORMED  Ankit Karis JubaAnil Patel, MD 07/21/2016 11:52 AM

## 2016-07-21 NOTE — Progress Notes (Signed)
Speech Language Pathology Daily Session Note  Patient Details  Name: Tyler AskewDavid Lane MRN: [id number] Date of Birth: 1950-07-06  Today's Date: 07/21/2016 SLP Individual Time: 1345-1415 SLP Individual Time Calculation (min): 30 min  Short Term Goals: Week 3: SLP Short Term Goal 1 (Week 3): Patient will consume trials of Dys. 1 textures without overt s/s of aspiration and Mod A verbal cues for swallow initiation in less than 20% of opportunities over 2 sessions prior to upgrade.  SLP Short Term Goal 2 (Week 3): Patient will follow commands in 75% of opportunities with Mod A verbal cues.  SLP Short Term Goal 3 (Week 3): Patient will initiate functional tasks within 1 minute with Max A verbal cues . SLP Short Term Goal 4 (Week 3): Patient will demonstrate sustained attention to a functional task for ~2 minutes with Max A multimodal cues.  SLP Short Term Goal 5 (Week 3): Patient will orient to place and situation with Max A verbal and visual cues.   Skilled Therapeutic Interventions: Skilled treatment session focused on addressing cognition goals. Upon SLP arrival patient attempting to get up out of bed without calling for assist.  Patient reported waking up and realizing he needed to go check on the dogs and feeling like there were things he needed to be doing.  SLP re-oriented patient and he was provided two options for where he could sit bed or chair; he chose recliner chair.  Patient acknowledged that he was very restless, but could not state what he needed or what SLP could do to help him.  After assessing patient's pain, thirst, hunger, and toileting needs; patient was able to state that he needed to pee.  SLP provided Min physical assist as well as Min assist verbal cues to problem solve use of urinal.  After going to the bathroom patient was able to settle into recliner.  He was left at the RN station with quick release belt in place.  Continue with current plan of care.     Function:  Eating Eating   Modified Consistency Diet: Yes Eating Assist Level: Set up assist for;Supervision or verbal cues;Helper performs IV, parenteral or tube feed   Eating Set Up Assist For: Opening containers       Cognition Comprehension Comprehension assist level: Understands basic 50 - 74% of the time/ requires cueing 25 - 49% of the time  Expression   Expression assist level: Expresses basic 75 - 89% of the time/requires cueing 10 - 24% of the time. Needs helper to occlude trach/needs to repeat words.  Social Interaction Social Interaction assist level: Interacts appropriately 50 - 74% of the time - May be physically or verbally inappropriate.  Problem Solving Problem solving assist level: Solves basic 25 - 49% of the time - needs direction more than half the time to initiate, plan or complete simple activities  Memory Memory assist level: Recognizes or recalls 25 - 49% of the time/requires cueing 50 - 75% of the time    Pain Pain Assessment Pain Assessment: No/denies pain Pain Score: 0-No pain  Therapy/Group: Individual Therapy  Charlane FerrettiMelissa Hartlyn Reigel, M.A., CCC-SLP [phone]  Cinzia Devos 07/21/2016, 4:09 PM

## 2016-07-22 ENCOUNTER — Inpatient Hospital Stay (HOSPITAL_COMMUNITY): Payer: Medicare HMO | Admitting: Occupational Therapy

## 2016-07-22 DIAGNOSIS — G479 Sleep disorder, unspecified: Secondary | ICD-10-CM

## 2016-07-22 LAB — GLUCOSE, CAPILLARY
GLUCOSE-CAPILLARY: 142 mg/dL — AB (ref 65–99)
GLUCOSE-CAPILLARY: 92 mg/dL (ref 65–99)
GLUCOSE-CAPILLARY: 114 mg/dL — AB (ref 65–99)
Glucose-Capillary: 102 mg/dL — ABNORMAL HIGH (ref 65–99)
Glucose-Capillary: 104 mg/dL — ABNORMAL HIGH (ref 65–99)
Glucose-Capillary: 110 mg/dL — ABNORMAL HIGH (ref 65–99)
Glucose-Capillary: 133 mg/dL — ABNORMAL HIGH (ref 65–99)

## 2016-07-22 MED ORDER — TRAZODONE HCL 50 MG PO TABS
50.0000 mg | ORAL_TABLET | Freq: Every evening | ORAL | Status: DC | PRN
  Administered 2016-07-22: 100 mg
  Filled 2016-07-22: qty 2

## 2016-07-22 NOTE — Plan of Care (Signed)
Problem: RH SAFETY Goal: RH STG ADHERE TO SAFETY PRECAUTIONS W/ASSISTANCE/DEVICE Will maintain safety with min cue and reminders   Outcome: Not Progressing Patient remains impulsive and removes lap belt.

## 2016-07-22 NOTE — Progress Notes (Signed)
Occupational Therapy Weekly Progress Note  Patient Details  Name: Tyler Lane MRN: [id number] Date of Birth: 1950-07-19  Beginning of progress report period: 07/12/16 End of progress report period: 07/21/16   Today's Date: 07/22/2016 OT Individual Time: 1021-1173 OT Individual Time Calculation (min): 31 min    Patient has met 2 of 2 short term goals.   Pt has made excellent progress during this report period. He exhibits improved awareness and attention during self care completion at shower level. He completes UB/LB bathing and dressing with steady assist, pt requiring overall mod cues for sequencing. Pt has improved functional transfer abilities, going from completing toilet transfers with Mod A, to now completing all transfers with Min A, ambulating without device. Pt is still inconsistently oriented to time, place, and situation. He continues to demonstrate behaviors consistent with [place]. For next report period, will continue to work on cognitive remediation, balance, and activity tolerance for LTG achievement.   Patient continues to demonstrate the following deficits: muscle weakness, decreased cardiorespiratoy endurance, decreased initiation, decreased attention, decreased awareness and decreased problem solving, memory, and decreased standing balance and therefore will continue to benefit from skilled OT intervention to enhance overall performance with BADL.  Patient exceeding established LTGs. Plesae see goal revisions..  Continue plan of care.  OT Short Term Goals Week 3:  OT Short Term Goal 1 (Week 3): STGs=LTGs due to ELOS  Skilled Therapeutic Interventions/Progress Updates:    Tx focus on cognitive remediation and following 1 step instructions  Pt greeted at RN station, restless, pulling on safety belt. Pt reporting that he wanted to "go see the puppies." Pt escorted to dayroom, had him use therapy ipad to type in "puppies" in [employer] (no grammatical  errors). With mod cues, pt recalling instruction on methods for scrolling/selecting images to enlarge on device. Pt selecting photos of puppies that resembled the dogs in his family pictures. Engaged in conversation about his pets, "Tyler Lane" and "Tyler Lane." Per pt, one is a black lab (he typed in "[employer] [facility]" in [employer] search with OT cue for searching black lab. OT assisted with spacebar demands. Pt appeared engaged/interested in task. Pt near end of session becoming increasingly restless. Had him listen to [place] which appeared to be calming (he stopped pulling on his safety belt). Pt returned to RN station at time of departure. Brought family photos from room for him to look at for stimulation (with his consent). .   Therapy Documentation Precautions:  Precautions Precautions: Fall Precaution Comments: R shoulder sling Other Brace/Splint: has cortrax feeding tube Restrictions Weight Bearing Restrictions: Yes RUE Weight Bearing: Non weight bearing RLE Weight Bearing: Weight bearing as tolerated LLE Weight Bearing: Weight bearing as tolerated   Pain: Pain Assessment Pain Score: 0-No pain ADL:      See Function Navigator for Current Functional Status.   Therapy/Group: Individual Therapy  Theia Dezeeuw A Villa Burgin 07/22/2016, 4:21 PM

## 2016-07-22 NOTE — Plan of Care (Signed)
Problem: RH COGNITION-NURSING Goal: RH STG ANTICIPATES NEEDS/CALLS FOR ASSIST W/ASSIST/CUES STG Anticipates Needs/Calls for Assist With min  Assistance/Cues.   Outcome: Not Progressing Pt impulsive; does not use call bell prior to getting out of bed or voiding.

## 2016-07-22 NOTE — Plan of Care (Signed)
Problem: RH Grooming Goal: LTG Patient will perform grooming w/assist,cues/equip (OT) LTG: Patient will perform grooming with assist, with/without cues using equipment (OT)  Upgraded due to progress  Problem: RH Dressing Goal: LTG Patient will perform upper body dressing (OT) LTG Patient will perform upper body dressing with assist, with/without cues (OT).  Upgraded due to progress  Problem: RH Toilet Transfers Goal: LTG Patient will perform toilet transfers w/assist (OT) LTG: Patient will perform toilet transfers with assist, with/without cues using equipment (OT)  Upgraded due to progress

## 2016-07-22 NOTE — Progress Notes (Signed)
[place] PHYSICAL MEDICINE & REHABILITATION     PROGRESS NOTE    Subjective/Complaints: Pt seen laying in bed this AM.  Sleep inconsistent per sleep chart.  He appears comfortable, but slightly restless.  ROS: Denies CP, SOB, N/V/D, ?reliability.  Objective: Vital Signs: Blood pressure 124/77, pulse 63, temperature 98.2 F (36.8 C), temperature source Oral, resp. rate 18, weight 70.1 kg (154 lb 9.6 oz), SpO2 100 %. No results found. No results for input(s): WBC, HGB, HCT, PLT in the last 72 hours. No results for input(s): NA, K, CL, GLUCOSE, BUN, CREATININE, CALCIUM in the last 72 hours.  Invalid input(s): CO CBG (last 3)   Recent Labs  07/22/16 0359 07/22/16 0756 07/22/16 1137  GLUCAP 102* 142* 92    Wt Readings from Last 3 Encounters:  07/22/16 70.1 kg (154 lb 9.6 oz)  07/02/16 74.3 kg (163 lb 12.8 oz)    Physical Exam:  Constitutional: He appears well-developed and well-nourished. NAD  HENT: Normocephalic. Atraumatic. +NG Eyes: EOMI. No discharge.  Cardiovascular: RRR. No JVD. Respiratory: CTA B.Unlabored. GI: ND, BS+ Musculoskeletal: No edema, no tenderness Neurological: He is alert and oriented to person only.  Follows basic commands. Limited insight and awareness. Motor: Spontaneously moving all 4 extremities Skin: Skin is warm and dry.  Psychiatric: cooperative   Assessment/Plan: 1. 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 Physiatrist is providing close team supervision and 24 hour management of active medical problems listed below. Physiatrist and rehab team continue to assess barriers to discharge/monitor patient progress toward functional and medical goals.  Function:  Bathing Bathing position Bathing activity did not occur: Refused Position: Systems developerhower  Bathing parts Body parts bathed by patient: Right arm, Left arm, Chest, Abdomen, Front  perineal area, Buttocks, Right upper leg, Left upper leg, Right lower leg, Left lower leg Body parts bathed by helper: Back  Bathing assist Assist Level: Touching or steadying assistance(Pt > 75%)      Upper Body Dressing/Undressing Upper body dressing   What is the patient wearing?: Pull over shirt/dress     Pull over shirt/dress - Perfomed by patient: Thread/unthread right sleeve, Thread/unthread left sleeve, Put head through opening, Pull shirt over trunk Pull over shirt/dress - Perfomed by helper: Thread/unthread right sleeve, Thread/unthread left sleeve, Put head through opening, Pull shirt over trunk        Upper body assist Assist Level: Supervision or verbal cues   Set up : To obtain clothing/put away  Lower Body Dressing/Undressing Lower body dressing   What is the patient wearing?: Pants, Socks, Shoes Underwear - Performed by patient: Thread/unthread right underwear leg, Thread/unthread left underwear leg, Pull underwear up/down   Pants- Performed by patient: Thread/unthread right pants leg, Thread/unthread left pants leg, Pull pants up/down Pants- Performed by helper: Thread/unthread right pants leg, Pull pants up/down Non-skid slipper socks- Performed by patient: Don/doff right sock, Don/doff left sock Non-skid slipper socks- Performed by helper: Don/doff right sock Socks - Performed by patient: Don/doff left sock, Don/doff right sock Socks - Performed by helper: Don/doff right sock, Don/doff left sock Shoes - Performed by patient: Don/doff right shoe, Don/doff left shoe Shoes - Performed by helper: Don/doff right shoe, Don/doff left shoe          Lower body assist Assist for lower body dressing: Touching or steadying assistance (Pt > 75%)      Toileting Toileting Toileting activity did not occur: No continent  bowel/bladder event Toileting steps completed by patient: Adjust clothing prior to toileting, Performs perineal hygiene Toileting steps completed by helper:  Performs perineal hygiene, Adjust clothing after toileting Toileting Assistive Devices: Grab bar or rail  Toileting assist Assist level: Touching or steadying assistance (Pt.75%)   Transfers Chair/bed transfer Chair/bed transfer activity did not occur: Safety/medical concerns Chair/bed transfer method: Ambulatory Chair/bed transfer assist level: Touching or steadying assistance (Pt > 75%) Chair/bed transfer assistive device: Armrests     Locomotion Ambulation Ambulation activity did not occur: Safety/medical concerns   Max distance: 80 ft Assist level: Touching or steadying assistance (Pt > 75%)   Wheelchair Wheelchair activity did not occur: Safety/medical concerns   Max wheelchair distance: 20' Assist Level: Maximal assistance (Pt 25 - 49%) (BLE)  Cognition Comprehension Comprehension assist level: Understands basic 50 - 74% of the time/ requires cueing 25 - 49% of the time  Expression Expression assist level: Expresses basic 75 - 89% of the time/requires cueing 10 - 24% of the time. Needs helper to occlude trach/needs to repeat words.  Social Interaction Social Interaction assist level: Interacts appropriately 50 - 74% of the time - May be physically or verbally inappropriate.  Problem Solving Problem solving assist level: Solves basic 25 - 49% of the time - needs direction more than half the time to initiate, plan or complete simple activities  Memory Memory assist level: Recognizes or recalls 25 - 49% of the time/requires cueing 50 - 75% of the time   Medical Problem List and Plan: 1.  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  DVT Prophylaxis/Anticoagulation: Mechanical: Sequential compression devices, below knee Bilateral lower extremities 3. Pain Management: Hydrocodone prn. Monitor for sedation.  4. Mood: [name] to follow for evaluation and support as mentation improves.  5.  Neuropsych: This patient is not capable of making decisions on his own behalf.  - ritalin for attention/arousal/initiation--continue at 10mg   6. Skin/Wound Care: routine skin care 7. Fluids/Electrolytes/Nutrition: on clears per SLP  -continue TF as well but have decreased rate to 40/hr to see if stimulates app for solids  -k+ supp  -BUN trending down---follow up labs tomorrow  Appetite overall improving 8. Right displaced sacral and right pubic rami fracture: WBAT 9. Right clavicle fracture: NWB --to keep arm below 90 degrees and sling when out of bed. 10. Acute urinary retention:    toilet patient every 4 hours.   -ua neg, ucx neg also  -urecholine trial. Improving continence a whole 11. MSSA RLL PNA:  . Rocephin complete    -remains afebrile. No clinical signs of active disease  -pt on clear liquids now   -wbc's normal 13. HTN: On metoprolol bid.    Controlled 6/10 14. Hyperglycemia  Related to tube feeds  Cont to monitor   Relatively controlled 6/10 15. Sleep disturbance  Trazodone increased on 6/10  LOS (Days) 20 A FACE TO FACE EVALUATION WAS PERFORMED  Renata Gambino Karis Juba, MD 07/22/2016 2:19 PM

## 2016-07-23 ENCOUNTER — Inpatient Hospital Stay (HOSPITAL_COMMUNITY): Payer: Medicare HMO | Admitting: Physical Therapy

## 2016-07-23 ENCOUNTER — Inpatient Hospital Stay (HOSPITAL_COMMUNITY): Payer: Medicare HMO | Admitting: Occupational Therapy

## 2016-07-23 ENCOUNTER — Encounter (HOSPITAL_COMMUNITY): Payer: Self-pay | Admitting: *Deleted

## 2016-07-23 ENCOUNTER — Inpatient Hospital Stay (HOSPITAL_COMMUNITY): Payer: Medicare HMO | Admitting: Speech Pathology

## 2016-07-23 LAB — GLUCOSE, CAPILLARY
GLUCOSE-CAPILLARY: 113 mg/dL — AB (ref 65–99)
GLUCOSE-CAPILLARY: 123 mg/dL — AB (ref 65–99)
GLUCOSE-CAPILLARY: 114 mg/dL — AB (ref 65–99)
Glucose-Capillary: 139 mg/dL — ABNORMAL HIGH (ref 65–99)
Glucose-Capillary: 94 mg/dL (ref 65–99)

## 2016-07-23 LAB — CBC
HCT: 46.5 % (ref 39.0–52.0)
HEMOGLOBIN: 15.7 g/dL (ref 13.0–17.0)
MCH: 32.2 pg (ref 26.0–34.0)
MCHC: 33.8 g/dL (ref 30.0–36.0)
MCV: 95.3 fL (ref 78.0–100.0)
Platelets: 199 10*3/uL (ref 150–400)
RBC: 4.88 MIL/uL (ref 4.22–5.81)
RDW: 15 % (ref 11.5–15.5)
WBC: 6.8 10*3/uL (ref 4.0–10.5)

## 2016-07-23 LAB — BASIC METABOLIC PANEL
Anion gap: 7 (ref 5–15)
BUN: 19 mg/dL (ref 6–20)
CO2: 26 mmol/L (ref 22–32)
CREATININE: 0.84 mg/dL (ref 0.61–1.24)
Calcium: 8.5 mg/dL — ABNORMAL LOW (ref 8.9–10.3)
Chloride: 103 mmol/L (ref 101–111)
GFR calc Af Amer: >60 mL/min (ref >60)
GLUCOSE: 120 mg/dL — AB (ref 65–99)
POTASSIUM: 3.9 mmol/L (ref 3.5–5.1)
SODIUM: 136 mmol/L (ref 135–145)

## 2016-07-23 NOTE — Progress Notes (Signed)
Physical Therapy Session Note  Patient Details  Name: Jannet AskewDavid Longshore MRN: [id number] Date of Birth: [birth year]  Today's Date: 07/23/2016 PT Individual Time: 0905-1002 PT Individual Time Calculation (min): 57 min   Short Term Goals: Week 3:  PT Short Term Goal 1 (Week 3): Pt will ambulate consistently 50 ft with min assist +1.  PT Short Term Goal 2 (Week 3): Pt will complete supine<>sit with min assist.   Skilled Therapeutic Interventions/Progress Updates:  Pt received in w/c & agreeable to tx. Session focused on cognitive remediation, functional mobility, activity tolerance, and strengthening. Pt ambulated gym>apartment>day room>gym with steady/min assist 2/2 pain in R hip (pain meds requested). Pt completed bed mobility (supine<>sitting) in apartment with supervision assist. Pt utilized cybex kinetron in sitting for BLE strengthening. Pt engaged in pipe tree assembly with task focusing on problem solving. Pt assembled a cross, selecting pieces from a choice of several; pt required max cuing and significantly extra time for one error correction.  During session pt engaged in orienting conversation. Pt demonstrates absent intellectual awareness regarding physical deficits but demonstrates some clarity & awareness as he reports he is here because he fell off the pressure washer.  At end of session pt left sitting in w/c in handoff to OT.  Therapy Documentation Precautions:  Precautions Precautions: Fall Precaution Comments: R shoulder sling Other Brace/Splint: has cortrax feeding tube Restrictions Weight Bearing Restrictions: Yes RUE Weight Bearing: Non weight bearing RLE Weight Bearing: Weight bearing as tolerated LLE Weight Bearing: Weight bearing as tolerated  Pain: Pt reports "my legs are bothering me" - pain meds requested.  See Function Navigator for Current Functional Status.   Therapy/Group: Individual Therapy  Sandi MariscalVictoria M Skai Lickteig 07/23/2016, 10:18 AM

## 2016-07-23 NOTE — Plan of Care (Signed)
Problem: RH Wheelchair Mobility Goal: LTG Patient will propel w/c in controlled environment (PT) LTG: Patient will propel wheelchair in controlled environment, # of feet with assist (PT)  Outcome: Adequate for Discharge Pt to d/c at ambulatory level     
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
## 2016-07-23 NOTE — Progress Notes (Signed)
Speech Language Pathology Daily Session Note  Patient Details  Name: Tyler AskewDavid Puskarich MRN: [id number] Date of Birth: 1951-02-12  Today's Date: 07/23/2016 SLP Individual Time: 1430-1500 SLP Individual Time Calculation (min): 30 min  Short Term Goals: Week 3: SLP Short Term Goal 1 (Week 3): Patient will consume trials of Dys. 1 textures without overt s/s of aspiration and Mod A verbal cues for swallow initiation in less than 20% of opportunities over 2 sessions prior to upgrade.  SLP Short Term Goal 2 (Week 3): Patient will follow commands in 75% of opportunities with Mod A verbal cues.  SLP Short Term Goal 3 (Week 3): Patient will initiate functional tasks within 1 minute with Max A verbal cues . SLP Short Term Goal 4 (Week 3): Patient will demonstrate sustained attention to a functional task for ~2 minutes with Max A multimodal cues.  SLP Short Term Goal 5 (Week 3): Patient will orient to place and situation with Max A verbal and visual cues.   Skilled Therapeutic Interventions: Skilled treatment session focused on dysphagia and cognitive goals. SLP facilitated by providing skilled observation with snack of Dys. 1 textures with thin liquids. Patient demonstrated mildly prolonged oral transit with pureed textures but did not demonstrate any overt s/s of aspiration. Recommend trial tray prior to upgrade. Patient demonstrated alternating attention between self-feeding and conversation with clinician for ~15 minutes with Min A verbal cues for redirection. Patient self-fed entire pudding with Mod I and demonstrated increased social interaction and engaged in an informal conversation about personal interests for ~10 minutes. Overall, patient demonstrated great improvement this session. Patient left upright in wheelchair with quick release belt in place at RN station. Continue with current plan of care.      Function:  Eating Eating   Modified Consistency Diet: Yes Eating Assist Level: Supervision or  verbal cues;More than reasonable amount of time           Cognition Comprehension Comprehension assist level: Understands basic 50 - 74% of the time/ requires cueing 25 - 49% of the time  Expression   Expression assist level: Expresses basic 75 - 89% of the time/requires cueing 10 - 24% of the time. Needs helper to occlude trach/needs to repeat words.  Social Interaction Social Interaction assist level: Interacts appropriately 50 - 74% of the time - May be physically or verbally inappropriate.  Problem Solving Problem solving assist level: Solves basic 25 - 49% of the time - needs direction more than half the time to initiate, plan or complete simple activities  Memory Memory assist level: Recognizes or recalls 25 - 49% of the time/requires cueing 50 - 75% of the time    Pain No/Denies Pain   Therapy/Group: Individual Therapy  Aarron Wierzbicki 07/23/2016, 3:16 PM
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
## 2016-07-23 NOTE — Progress Notes (Signed)
Physical Therapy Weekly Progress Note  Patient Details  Name: [name] MRN: [id number] Date of Birth: 02/16/1950  Beginning of progress report period: July 17, 2016 End of progress report period: July 24, 2016  Today's Date: 07/24/2016  Patient has met 2 of 2 short term goals.  Pt is making good physical progress as he is able to ambulate >200 ft with steady/min assist; pt continues to require min assist 2/2 R hip pain & antalgic gait. Pt continues to demonstrate impaired cognition (memory, recall, awareness, problem solving). Pt would benefit from continued skilled PT to focus on cognitive remediation and to progress endurance, balance, gait, stair negotiation, and safety awareness.  Patient continues to demonstrate the following deficits muscle weakness, decreased cardiorespiratoy endurance, decreased coordination, decreased initiation, decreased attention, decreased awareness, decreased problem solving, decreased safety awareness, decreased memory and delayed processing, and decreased standing balance, decreased postural control and decreased balance strategies and therefore will continue to benefit from skilled PT intervention to increase functional independence with mobility.  Patient progressing toward long term goals..  Continue plan of care.  PT Short Term Goals Week 3:  PT Short Term Goal 1 (Week 3): Pt will ambulate consistently 50 ft with min assist +1.  PT Short Term Goal 1 - Progress (Week 3): Met PT Short Term Goal 2 (Week 3): Pt will complete supine<>sit with min assist.  PT Short Term Goal 2 - Progress (Week 3): Met Week 4:  PT Short Term Goal 1 (Week 4): Pt will demonstrate intellectual awareness with max cuing. PT Short Term Goal 2 (Week 4): Pt will consistently perform bed<>chair transfer with supervision.  Therapy Documentation Precautions:  Precautions Precautions: Fall Precaution Comments: R shoulder sling Other Brace/Splint: has cortrax feeding  tube Restrictions Weight Bearing Restrictions: Yes RUE Weight Bearing: Non weight bearing RLE Weight Bearing: Weight bearing as tolerated LLE Weight Bearing: Weight bearing as tolerated   See Function Navigator for Current Functional Status.   Tyler Lane 07/24/2016, 8:02 AM
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
## 2016-07-24 ENCOUNTER — Inpatient Hospital Stay (HOSPITAL_COMMUNITY): Payer: Medicare HMO

## 2016-07-24 ENCOUNTER — Inpatient Hospital Stay (HOSPITAL_COMMUNITY): Payer: Medicare HMO | Admitting: Occupational Therapy

## 2016-07-24 ENCOUNTER — Inpatient Hospital Stay (HOSPITAL_COMMUNITY): Payer: Medicare HMO | Admitting: Speech Pathology

## 2016-07-24 ENCOUNTER — Inpatient Hospital Stay (HOSPITAL_COMMUNITY): Payer: Medicare HMO | Admitting: Physical Therapy

## 2016-07-24 LAB — GLUCOSE, CAPILLARY
GLUCOSE-CAPILLARY: 155 mg/dL — AB (ref 65–99)
GLUCOSE-CAPILLARY: 128 mg/dL — AB (ref 65–99)
Glucose-Capillary: 117 mg/dL — ABNORMAL HIGH (ref 65–99)
Glucose-Capillary: 114 mg/dL — ABNORMAL HIGH (ref 65–99)
Glucose-Capillary: 131 mg/dL — ABNORMAL HIGH (ref 65–99)
Glucose-Capillary: 126 mg/dL — ABNORMAL HIGH (ref 65–99)

## 2016-07-24 MED ORDER — METHYLPHENIDATE HCL 5 MG PO TABS
10.0000 mg | ORAL_TABLET | Freq: Two times a day (BID) | ORAL | Status: DC
  Administered 2016-07-25 – 2016-07-31 (×14): 10 mg via ORAL
  Filled 2016-07-24 (×14): qty 2

## 2016-07-24 MED ORDER — ASPIRIN 81 MG PO CHEW
81.0000 mg | CHEWABLE_TABLET | Freq: Every day | ORAL | Status: DC
  Administered 2016-07-25 – 2016-07-31 (×7): 81 mg via ORAL
  Filled 2016-07-24 (×7): qty 1

## 2016-07-24 MED ORDER — METOPROLOL TARTRATE 25 MG/10 ML ORAL SUSPENSION
12.5000 mg | Freq: Two times a day (BID) | ORAL | Status: DC
  Administered 2016-07-24 – 2016-07-31 (×13): 12.5 mg via ORAL
  Filled 2016-07-24 (×13): qty 10

## 2016-07-24 MED ORDER — POLYETHYLENE GLYCOL 3350 17 G PO PACK: 17.0000 g | PACK | Freq: Every day | ORAL | Status: DC | PRN

## 2016-07-24 MED ORDER — AMANTADINE HCL 50 MG/5ML PO SYRP: 100.0000 mg | ORAL_SOLUTION | Freq: Every day | ORAL | Status: DC

## 2016-07-24 MED ORDER — GUAIFENESIN-DM 100-10 MG/5ML PO SYRP: 5.0000 mL | ORAL_SOLUTION | Freq: Four times a day (QID) | ORAL | Status: DC | PRN

## 2016-07-24 MED ORDER — PROCHLORPERAZINE 25 MG RE SUPP: 12.5000 mg | Freq: Four times a day (QID) | RECTAL | Status: DC | PRN

## 2016-07-24 MED ORDER — TRAZODONE HCL 50 MG PO TABS
50.0000 mg | ORAL_TABLET | Freq: Every evening | ORAL | Status: DC | PRN
  Administered 2016-07-28: 50 mg via ORAL
  Administered 2016-07-29 – 2016-07-30 (×2): 100 mg via ORAL
  Filled 2016-07-24: qty 1
  Filled 2016-07-24 (×2): qty 2

## 2016-07-24 MED ORDER — ACETAMINOPHEN 160 MG/5ML PO SOLN: 325.0000 mg | Freq: Four times a day (QID) | ORAL | Status: DC | PRN

## 2016-07-24 MED ORDER — DIPHENHYDRAMINE HCL 12.5 MG/5ML PO ELIX
12.5000 mg | ORAL_SOLUTION | Freq: Four times a day (QID) | ORAL | Status: DC | PRN
  Administered 2016-07-31: 25 mg via ORAL
  Filled 2016-07-24: qty 10

## 2016-07-24 MED ORDER — AMANTADINE HCL 50 MG/5ML PO SYRP
100.0000 mg | ORAL_SOLUTION | Freq: Every day | ORAL | Status: DC
  Administered 2016-07-25 – 2016-07-26 (×2): 100 mg via ORAL
  Filled 2016-07-24 (×3): qty 10

## 2016-07-24 MED ORDER — ALUM & MAG HYDROXIDE-SIMETH 200-200-20 MG/5ML PO SUSP
30.0000 mL | ORAL | Status: DC | PRN
  Administered 2016-07-28: 30 mL via ORAL
  Filled 2016-07-24: qty 30

## 2016-07-24 MED ORDER — MEGESTROL ACETATE 400 MG/10ML PO SUSP
400.0000 mg | Freq: Two times a day (BID) | ORAL | Status: DC
  Administered 2016-07-24 – 2016-07-29 (×12): 400 mg via ORAL
  Filled 2016-07-24 (×13): qty 10

## 2016-07-24 MED ORDER — PROCHLORPERAZINE MALEATE 5 MG PO TABS: 5.0000 mg | ORAL_TABLET | Freq: Four times a day (QID) | ORAL | Status: DC | PRN

## 2016-07-24 MED ORDER — PROCHLORPERAZINE EDISYLATE 5 MG/ML IJ SOLN: 5.0000 mg | Freq: Four times a day (QID) | INTRAMUSCULAR | Status: DC | PRN

## 2016-07-24 MED ORDER — BETHANECHOL CHLORIDE 25 MG PO TABS
25.0000 mg | ORAL_TABLET | Freq: Three times a day (TID) | ORAL | Status: DC
  Administered 2016-07-25 – 2016-07-26 (×4): 25 mg via ORAL
  Filled 2016-07-24 (×5): qty 1

## 2016-07-24 NOTE — Progress Notes (Signed)
Occupational Therapy Session Note  Patient Details  Name: Jannet AskewDavid Weltman MRN: [id number] Date of Birth: 02-20-1950  Today's Date: 07/24/2016 OT Individual Time: 4132-44011503-1535 OT Individual Time Calculation (min): 32 min    Short Term Goals: Week 3:  OT Short Term Goal 1 (Week 3): STGs=LTGs due to ELOS  Skilled Therapeutic Interventions/Progress Updates:    Pt received sitting up in w/c at nurse's station ready for OT tx session. Focus of session cognitive remediation and toileting ADLs. Pt participated in card game "war", determining which card was higher when given two options with Min verbal cues to correctly identify higher cards with approx 75% accuracy during card game. Pt verbalizing needing to use bathroom with OT assisting Pt back to room via w/c. Pt completed brief bathroom level mobility with HHA to/from toilet, MinA for toilet transfer to/from toilet with use of grab bar, and minA to steady during clothing management. Pt completed grooming washing hands while seated in w/c at sink with setup. Ended session with Pt sitting up in w/c, RN present to administer meds.   Therapy Documentation Precautions:  Precautions Precautions: Fall Precaution Comments: R shoulder sling Other Brace/Splint: has cortrax feeding tube Restrictions Weight Bearing Restrictions: Yes RUE Weight Bearing: Non weight bearing RLE Weight Bearing: Weight bearing as tolerated LLE Weight Bearing: Weight bearing as tolerated   Pain: Pain Assessment Pain Assessment: Faces Faces Pain Scale: No hurt    See Function Navigator for Current Functional Status.   Therapy/Group: Individual Therapy  Orlando PennerBreanna L Osinachi Navarrette 07/24/2016, 5:41 PM

## 2016-07-24 NOTE — [place] FL2 (Signed)
[place] MEDICAID FL2 LEVEL OF CARE SCREENING TOOL     IDENTIFICATION  Patient Name: Tyler Lane Birthdate: 01-04-1951 Sex: male Admission Date (Current Location): 07/02/2016  [facility] and [place] Number:  Producer, television/film/video and Address:  [facility] [place]. [facility], [address] [place], [place] [address]      Provider Number: [id number]  Attending Physician Name and Address:  Ranelle Oyster, MD  Relative Name and Phone Number:       Current Level of Care: Other (Comment) (Acute Inpatient Rehabiliation) Recommended Level of Care: Skilled Nursing Facility Prior Approval Number:    Date Approved/Denied:   PASRR Number: [id number]  Discharge Plan: SNF    Current Diagnoses: Patient Active Problem List   Diagnosis Date Noted  . Sleep disorder   . Hyperglycemia   . Benign essential HTN   . Dysphagia   . SAH (subarachnoid hemorrhage) (HCC) 07/02/2016  . Intracerebral hemorrhage, intraventricular (HCC) 07/02/2016  . Right clavicle fracture 07/02/2016  . Fracture of multiple ribs 07/02/2016  . Sacral fracture (HCC) 07/02/2016  . Chest trauma   . Closed fracture of one rib   . Urinary retention   . Pneumonia of right lower lobe due to methicillin susceptible Staphylococcus aureus (MSSA) (HCC)   . Prerenal azotemia   . 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 Fracture of clavicle, right, closed 06/22/2016  . TBI (traumatic brain injury) (HCC) 06/21/2016    Orientation RESPIRATION BLADDER Height & Weight     Self  Normal Continent (occasional incontinence usually at night) Weight: 70.2 kg (154 lb 13.5 oz) Height:     BEHAVIORAL SYMPTOMS/MOOD NEUROLOGICAL BOWEL NUTRITION STATUS      Continent Diet  AMBULATORY STATUS COMMUNICATION OF NEEDS Skin   Limited Assist Verbally Normal                       Personal Care Assistance Level of Assistance  Bathing, Feeding, Dressing Bathing Assistance:  Limited assistance Feeding assistance:  (supervision for encouragement) Dressing Assistance: Limited assistance     Functional Limitations Info             SPECIAL CARE FACTORS FREQUENCY  PT (By licensed PT), OT (By licensed OT), Speech therapy     PT Frequency: 5x/wk OT Frequency: 5x/wk     Speech Therapy Frequency: 5x/sk      Contractures Contractures Info: Not present    Additional Factors Info  Code Status Code Status Info: Full             Current Medications (07/24/2016):  This is the current hospital active medication list Current Facility-Administered Medications  Medication Dose Route Frequency Provider Last Rate Last Dose  . acetaminophen (TYLENOL) solution 325-650 mg  325-650 mg Oral Q6H PRN Ranelle Oyster, MD      . alum & mag hydroxide-simeth (MAALOX/MYLANTA) 200-200-20 MG/5ML suspension 30 mL  30 mL Oral Q4H PRN Ranelle Oyster, MD      . Melene Muller ON 07/25/2016] amantadine (SYMMETREL) 50 MG/5ML solution 100 mg  100 mg Oral Daily Ranelle Oyster, MD      . Melene Muller ON 07/25/2016] aspirin chewable tablet 81 mg  81 mg Oral Daily Faith Rogue T, MD      . bethanechol (URECHOLINE) tablet 25 mg  25 mg Oral TID Ranelle Oyster, MD      . bisacodyl (DULCOLAX) suppository 10 mg  10  mg Rectal Daily PRN Jacquelynn CreeLove, Pamela S, PA-C   10 mg at 07/20/16 0449  . chlorhexidine (PERIDEX) 0.12 % solution 15 mL  15 mL Mouth Rinse BID Jacquelynn CreeLove, Pamela S, PA-C   15 mL at 07/24/16 0759  . diphenhydrAMINE (BENADRYL) 12.5 MG/5ML elixir 12.5-25 mg  12.5-25 mg Oral Q6H PRN Ranelle OysterSwartz, Zachary T, MD      . guaiFENesin-dextromethorphan [facility](ROBITUSSIN DM) 100-10 MG/5ML syrup 5-10 mL  5-10 mL Oral Q6H PRN Ranelle OysterSwartz, Zachary T, MD      . hydrocerin (EUCERIN) cream   Topical PRN Plotnikov, Georgina QuintAleksei V, MD      . hydrocerin (EUCERIN) cream   Topical BID Love, Pamela S, PA-C      . HYDROcodone-acetaminophen (HYCET) 7.5-325 mg/15 ml solution 10 mL  10 mL Oral BID WC Love, Evlyn Kanneramela S, PA-C   10 mL at 07/24/16  1250  . HYDROcodone-acetaminophen (HYCET) 7.5-325 mg/15 ml solution 15 mL  15 mL Per Tube Q4H PRN Jacquelynn CreeLove, Pamela S, PA-C   15 mL at 07/17/16 1026  . lidocaine (XYLOCAINE) 2 % jelly   Topical PRN Love, Evlyn KannerPamela S, PA-C      . LORazepam (ATIVAN) injection 0.5-1 mg  0.5-1 mg Intravenous Q4H PRN Love, Pamela S, PA-C      . MEDLINE mouth rinse  15 mL Mouth Rinse q12n4p Jacquelynn CreeLove, Pamela S, PA-C   15 mL at 07/22/16 1703  . megestrol (MEGACE) 400 MG/10ML suspension 400 mg  400 mg Oral BID Ranelle OysterSwartz, Zachary T, MD   400 mg at 07/24/16 1538  . [START ON 07/25/2016] methylphenidate (RITALIN) tablet 10 mg  10 mg Oral BID Faith RogueSwartz, Zachary T, MD      . metoprolol tartrate (LOPRESSOR) 25 mg/10 mL oral suspension 12.5 mg  12.5 mg Oral BID Faith RogueSwartz, Zachary T, MD      . nicotine (NICODERM CQ - dosed in mg/24 hours) patch 21 mg  21 mg Transdermal Daily Jacquelynn CreeLove, Pamela S, PA-C   21 mg at 07/24/16 0806  . polyethylene glycol (MIRALAX / GLYCOLAX) packet 17 g  17 g Oral Daily PRN Faith RogueSwartz, Zachary T, MD      . pravastatin (PRAVACHOL) tablet 40 mg  40 mg Oral q1800 Jacquelynn CreeLove, Pamela S, PA-C   40 mg at 07/24/16 1824  . prochlorperazine (COMPAZINE) tablet 5-10 mg  5-10 mg Oral Q6H PRN Ranelle OysterSwartz, Zachary T, MD       Or  . prochlorperazine (COMPAZINE) injection 5-10 mg  5-10 mg Intramuscular Q6H PRN Ranelle OysterSwartz, Zachary T, MD       Or  . prochlorperazine (COMPAZINE) suppository 12.5 mg  12.5 mg Rectal Q6H PRN Ranelle OysterSwartz, Zachary T, MD      . sodium phosphate (FLEET) 7-19 GM/118ML enema 1 enema  1 enema Rectal Once PRN Love, Pamela S, PA-C      . traZODone (DESYREL) tablet 50-100 mg  50-100 mg Oral QHS PRN Ranelle OysterSwartz, Zachary T, MD      . triamcinolone ointment (KENALOG) 0.1 %   Topical TID PRN Plotnikov, Georgina QuintAleksei V, MD         Discharge Medications: Please see discharge summary for a list of discharge medications.  Relevant Imaging Results:  Relevant Lab Results:   Additional Information SS#  [id number]  Amada JupiterHOYLE, Porsha Skilton, [name]

## 2016-07-24 NOTE — Progress Notes (Signed)
Physical Therapy Session Note  Patient Details  Name: Tyler AskewDavid Hagg MRN: [id number] Date of Birth: [birth year]  Today's Date: 07/24/2016 PT Individual Time: 1130-1200 PT Individual Time Calculation (min): 30 min   Skilled Therapeutic Interventions/Progress Updates:    Session focused on functional ambulation on unit for overall endurance and equalizing weightbearing through BLE including over uneven surface (mulched surface) and ramp negotiation for community mobility with min assist and verbal cues not to use RUE for support, functional dynamic standing balance and dual task cognitive activity to identify labeled bean bags and sort in appropriate categories (required questioning cues for 4/15), and overall endurance on Nustep with BLE on level 5 x 5 min. End of session hand off to SLP for lunch.   Therapy Documentation Precautions:  Precautions Precautions: Fall Precaution Comments: R shoulder sling Other Brace/Splint: has cortrax feeding tube Restrictions Weight Bearing Restrictions: Yes RUE Weight Bearing: Non weight bearing RLE Weight Bearing: Weight bearing as tolerated LLE Weight Bearing: Weight bearing as tolerated   Pain: Pain Assessment Pain Assessment: 0-10 Pain Score: 7  Pain Type: Acute pain Pain Location: Leg Pain Orientation: Right;Left Pain Descriptors / Indicators: Aching Pain Frequency: Occasional Pain Onset: Gradual Patients Stated Pain Goal: 0 Pain Intervention(s): Medication (See eMAR)  See Function Navigator for Current Functional Status.   Therapy/Group: Individual Therapy  Karolee StampsGray, Jaylon Grode Darrol PokeBrescia  Vennela Jutte B. Jaretssi Kraker, PT, DPT  07/24/2016, 12:33 PM

## 2016-07-24 NOTE — Progress Notes (Signed)
Social Work Patient ID: Tyler Lane, male   DOB: [birth year], 66 y.o.   MRN: [id number]   Long discussion on phone with pt's daughter, Tyler Lane, living in [place].  She was very pleased to hear of pt's progress this week overall and the removal of NG tube.  Family still feels they would like to pursue short term SNF to maximize his overall time with daily therapies.  Bed search underway.    Nyisha Clippard, [name]

## 2016-07-24 NOTE — Progress Notes (Signed)
Cortrak tube discontinued at 1640. Patient eating good Dys I thin liquid meal. Patient able to swallow meds crushed in applesauce. Continue with plan of care.  Cleotilde NeerJoyce, Melo Stauber S

## 2016-07-24 NOTE — Progress Notes (Signed)
Physical Therapy Session Note  Patient Details  Name: Tyler AskewDavid River MRN: [id number] Date of Birth: [birth year]  Today's Date: 07/24/2016 PT Individual Time: 0905-1005 PT Individual Time Calculation (min): 60 min   Short Term Goals: Week 3:   Week 4:  PT Short Term Goal 1 (Week 4): Pt will demonstrate intellectual awareness with max cuing. PT Short Term Goal 2 (Week 4): Pt will consistently perform bed<>chair transfer with supervision.  Skilled Therapeutic Interventions/Progress Updates:  Pt received in w/c & agreeable to tx. Session focused on orientation, functional mobility, ADL's, and cognitive remediation. Pt able to apply poligrip and dentures with min assist, pt also requires min assist for sequencing hand hygiene at sink to obtain soap and paper towels. Pt ambulated throughout unit with LUE supported on therapist's shoulder 2/2 R hip pain & antalgic gait. Pt made coffee in family room with max cuing/assist for locating items and completing task. Pt required close supervision/steady assist for standing balance. Pt engaged in peg board activity, correctly completing 1 simple pattern with extra time & correctly selecting pieces from a choice of many. Attempted to have pt complete slightly more complex shape but pt with minimal attention and reports decreased interest in activity. At end of session pt left sitting in w/c at nurses station with QRB donned.  During session pt requires max cuing for intellectual awareness, eventually recalling he broke something in his arm/neck and hit his head. Pt able to recall 3/3 of his children's names on this date.   Therapy Documentation Precautions:  Precautions Precautions: Fall Precaution Comments: R shoulder sling Other Brace/Splint: has cortrax feeding tube Restrictions Weight Bearing Restrictions: Yes RUE Weight Bearing: Non weight bearing RLE Weight Bearing: Weight bearing as tolerated LLE Weight Bearing: Weight bearing as  tolerated  Pain: Pt c/o "my legs are bothering me" - RN made aware & pain meds administered during session, rest breaks also provided PRN.  See Function Navigator for Current Functional Status.   Therapy/Group: Individual Therapy  Sandi MariscalVictoria M Meygan Kyser 07/24/2016, 11:54 AM

## 2016-07-24 NOTE — Progress Notes (Signed)
[facility] PHYSICAL MEDICINE & REHABILITATION     PROGRESS NOTE    Subjective/Complaints: Up in w/c. Feels well. Slept last night  ROS: pt denies nausea, vomiting, diarrhea, cough, shortness of breath or chest pain    Objective: Vital Signs: Blood pressure 120/68, pulse 92, temperature 97.8 F (36.6 C), temperature source Oral, resp. rate 18, weight 70.2 kg (154 lb 13.5 oz), SpO2 96 %. No results found.  Recent Labs  07/23/16 0545  WBC 6.8  HGB 15.7  HCT 46.5  PLT 199    Recent Labs  07/23/16 0545  NA 136  K 3.9  CL 103  GLUCOSE 120*  BUN 19  CREATININE 0.84  CALCIUM 8.5*   CBG (last 3)   Recent Labs  07/24/16 0104 07/24/16 0418 07/24/16 0858  GLUCAP 117* 114* 131*    Wt Readings from Last 3 Encounters:  07/23/16 70.2 kg (154 lb 13.5 oz)  07/02/16 74.3 kg (163 lb 12.8 oz)    Physical Exam:  Constitutional: He appears well-developed and well-nourished. NAD  HENT: Normocephalic. Atraumatic. +NG in place Eyes: EOMI. No discharge.  Cardiovascular:RRR without murmur. No JVD . Respiratory: CTA Bilaterally without wheezes or rales. Normal effort  GI: ND, BS+ Musculoskeletal: No edema, minimal right shoulder tenderness Neurological: He is alert and oriented to person only.  Follows basic commands.improving insight and awareness. Moves all 4 limbs Skin: Skin is warm and dry.  Psychiatric: cooperative   Assessment/Plan: 1. 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 Physiatrist is providing close team supervision and 24 hour management of active medical problems listed below. Physiatrist and rehab team continue to assess barriers to discharge/monitor patient progress toward functional and medical goals.  Function:  Bathing Bathing position Bathing activity did not occur: Refused Position: Systems developer parts bathed by patient:  Right arm, Left arm, Chest, Abdomen, Front perineal area, Buttocks, Right upper leg, Left upper leg, Right lower leg, Left lower leg Body parts bathed by helper: Back  Bathing assist Assist Level: Touching or steadying assistance(Pt > 75%)      Upper Body Dressing/Undressing Upper body dressing   What is the patient wearing?: Pull over shirt/dress     Pull over shirt/dress - Perfomed by patient: Thread/unthread right sleeve, Thread/unthread left sleeve, Put head through opening, Pull shirt over trunk Pull over shirt/dress - Perfomed by helper: Thread/unthread right sleeve, Thread/unthread left sleeve, Put head through opening, Pull shirt over trunk        Upper body assist Assist Level: Supervision or verbal cues   Set up : To obtain clothing/put away  Lower Body Dressing/Undressing Lower body dressing   What is the patient wearing?: Pants, Non-skid slipper socks, Underwear Underwear - Performed by patient: Thread/unthread right underwear leg, Thread/unthread left underwear leg, Pull underwear up/down   Pants- Performed by patient: Thread/unthread right pants leg, Thread/unthread left pants leg, Pull pants up/down Pants- Performed by helper: Thread/unthread right pants leg, Pull pants up/down Non-skid slipper socks- Performed by patient: Don/doff right sock, Don/doff left sock Non-skid slipper socks- Performed by helper: Don/doff right sock Socks - Performed by patient: Don/doff left sock, Don/doff right sock Socks - Performed by helper: Don/doff right sock, Don/doff left sock Shoes - Performed by patient: Don/doff right shoe, Don/doff left shoe Shoes - Performed by helper: Don/doff right shoe, Don/doff left shoe          Lower body assist Assist  for lower body dressing: Touching or steadying assistance (Pt > 75%)      Toileting Toileting Toileting activity did not occur: No continent bowel/bladder event Toileting steps completed by patient: Adjust clothing prior to toileting,  Performs perineal hygiene Toileting steps completed by helper: Performs perineal hygiene, Adjust clothing after toileting Toileting Assistive Devices: Grab bar or rail  Toileting assist Assist level: Touching or steadying assistance (Pt.75%)   Transfers Chair/bed transfer Chair/bed transfer activity did not occur: Safety/medical concerns Chair/bed transfer method: Ambulatory Chair/bed transfer assist level: Touching or steadying assistance (Pt > 75%) Chair/bed transfer assistive device: Armrests     Locomotion Ambulation Ambulation activity did not occur: Safety/medical concerns   Max distance: >150 ft Assist level: Touching or steadying assistance (Pt > 75%)   Wheelchair Wheelchair activity did not occur: Safety/medical concerns   Max wheelchair distance: 20' Assist Level: Maximal assistance (Pt 25 - 49%) (BLE)  Cognition Comprehension Comprehension assist level: Understands basic 50 - 74% of the time/ requires cueing 25 - 49% of the time  Expression Expression assist level: Expresses basic 75 - 89% of the time/requires cueing 10 - 24% of the time. Needs helper to occlude trach/needs to repeat words.  Social Interaction Social Interaction assist level: Interacts appropriately 50 - 74% of the time - May be physically or verbally inappropriate.  Problem Solving Problem solving assist level: Solves basic 25 - 49% of the time - needs direction more than half the time to initiate, plan or complete simple activities  Memory Memory assist level: Recognizes or recalls 25 - 49% of the time/requires cueing 50 - 75% of the time   Medical Problem List and Plan: 1.  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  DVT Prophylaxis/Anticoagulation: Mechanical: Sequential compression devices, below knee Bilateral lower extremities 3. Pain Management: Hydrocodone prn. Monitor for sedation.  4. Mood: [name] to  follow for evaluation and support as mentation improves.  5. Neuropsych: This patient is not capable of making decisions on his own behalf.  - ritalin for attention/arousal/initiation--continue at 10mg   6. Skin/Wound Care: routine skin care 7. Fluids/Electrolytes/Nutrition/dysphagia: on clears per SLP  -spoke with SLP and pt. Feeling is that he might be able to start engaging in solid intake  -will initiate megace to see if this helps cause also  -pt states that he is willing to eat solids and doesn't want PEG  -continue TF as well but have decreased rate to 40/hr to see if stimulates app for solids--no change yet  -k+ supp  -BUN trending down-     8. Right displaced sacral and right pubic rami fracture: WBAT 9. Right clavicle fracture: NWB --to keep arm below 90 degrees and sling when out of bed. 10. Acute urinary retention:    toilet patient every 4 hours.   -ua neg, ucx neg also  -urecholine trial. Improving continence a whole 11. MSSA RLL PNA:  . Rocephin complete    -remains afebrile. No clinical signs of active disease  -pt on clear liquids now   -wbc's normal 13. HTN: On metoprolol bid.    Controlled   14. Hyperglycemia  Related to tube feeds  Cont to monitor    improved---dc cbg's 15. Sleep disturbance  Trazodone increased on 6/10  LOS (Days) 22 A FACE TO FACE EVALUATION WAS PERFORMED  Faith RogueSWARTZ,Anabel Lykins T, MD 07/24/2016 9:02 AM

## 2016-07-24 NOTE — Plan of Care (Signed)
Problem: RH BOWEL ELIMINATION Goal: RH STG MANAGE BOWEL WITH ASSISTANCE STG Manage Bowel with min Assistance.  Outcome: Not Progressing Patient will require increase medication or suppository  Problem: RH BLADDER ELIMINATION Goal: RH STG MANAGE BLADDER WITH EQUIPMENT WITH ASSISTANCE STG Manage Bladder With Equipment With mod I.  Outcome: Progressing Remains mod assist at this time  Problem: RH COGNITION-NURSING Goal: RH STG ANTICIPATES NEEDS/CALLS FOR ASSIST W/ASSIST/CUES STG Anticipates Needs/Calls for Assist With min  Assistance/Cues.   Outcome: Not Progressing Max cues

## 2016-07-24 NOTE — Patient Care Conference (Signed)
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 Sleep disorder   . Hyperglycemia   . Benign essential HTN   . Dysphagia   . SAH (subarachnoid hemorrhage) (HCC) 07/02/2016  . Intracerebral hemorrhage, intraventricular (HCC) 07/02/2016  . Right clavicle fracture 07/02/2016  . Fracture of multiple ribs 07/02/2016  . Sacral fracture (HCC) 07/02/2016  . Chest trauma   . Closed fracture of one rib   . Urinary retention   . Pneumonia of right lower lobe due to methicillin susceptible Staphylococcus aureus (MSSA) (HCC)   . Prerenal azotemia   . 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 Fracture of clavicle, right, closed 06/22/2016  . TBI (traumatic brain injury) (HCC) 06/21/2016    Expected Discharge Date: Expected Discharge Date:  (SNF)  Team Members Present: Physician leading conference: Dr. Faith RogueZachary Swartz Social Worker Present: Amada JupiterLucy Maha Fischel, [name] Nurse Present: Carmie EndAngie Joyce, RN PT Present: Katherine Mantleodney Wishart, PT OT Present: Blanch MediaStephanie Schlosser, OT SLP Present: Feliberto Gottronourtney Payne, SLP PPS Coordinator present : Tora DuckMarie Noel, RN, CRRN     Current Status/Progress Goal Weekly Team Focus  Medical   improving arousal and attention, sleep a little better. still not taking in solids by  mouth  increase nutrition  nutrition, cognition optimization, may need PEG   Bowel/Bladder   Patient continent of bladder, Gets OOB to void - sometimes in the floor.  Redirection used.  LBM 07/20/16  Patient to remain continent of B/B while on IPR  Monitor B/B function and toilet patient q2h and as needed   Swallow/Nutrition/ Hydration   Dys. 1 textures with thin liquids, full supervision  Min A with least restrictive diet  tolerance of current diet    ADL's   Min A bathing at shower level, supervision UB dressing, Min guard LB dressing, Min A toilet transfers/toileting, Min A shower transfers  Supervision-Min A (3 goals upgraded due to progress)  Cognitive remediation, self care mgt retraining, balance, functional transfers, family education, activity tolerance    Mobility   min assist overall, pt limited by R hip pain, impaired cognition  min assist overall, supervision bed<>chair  cognitive remediation, balance, gait, endurance, awareness, transfers   Communication             Safety/Cognition/ Behavioral Observations  [place] Level VI-Max A  Min A  attention, awareness, problem solving    Pain   Patient denies pain at this time  Patient to have pain <2 while on IPR  Assess pain level and medicate as ordered and needed.   Skin   Skin intact at this time  Patient to remain free of skin breakdown while on IPR  Assess skin q shift and as needed.    Rehab Goals  Patient on target to meet rehab goals: Yes *See Care Plan and progress notes for long and short-term goals.  Barriers to Discharge: see prior, dysphagia for solids    Possible Resolutions to Barriers:  see prior, likely PEG for longer term nutrition    Discharge Planning/Teaching Needs:  d/c plan changed to SNF      Team Discussion:  Very good po intake today! - MD to remove NG tube.  Making good gains with cognition as well but still with pm confusion.  Most of the time he is now continent.  Min assist with amb.  SW reports  change in d/c plan to SNF.  Revisions to Treatment Plan:  D/c plan changed.   Continued Need for Acute Rehabilitation Level of Care: The patient requires daily medical management by a physician with specialized training in physical medicine and rehabilitation for the following conditions: Daily direction of a multidisciplinary physical rehabilitation program to ensure safe treatment while eliciting the highest outcome that is of practical value to the patient.: Yes Daily medical management of patient stability for increased activity during participation in an intensive rehabilitation regime.: Yes Daily analysis of laboratory values and/or radiology reports with any subsequent need for medication adjustment of medical intervention for : Post surgical problems;Neurological problems;Nutritional problems  Yanisa Goodgame 07/24/2016, 6:27 PM

## 2016-07-24 NOTE — Progress Notes (Signed)
Occupational Therapy Session Note  Patient Details  Name: Tyler Lane: [id number] Date of Birth: 03/16/1950  Today's Date: 07/24/2016 OT Individual Time: [id number] OT Individual Time Calculation (min): 59 min    Short Term Goals: Week 3:  OT Short Term Goal 1 (Week 3): STGs=LTGs due to ELOS  Skilled Therapeutic Interventions/Progress Updates:    Upon entering the room, pt seated in wheelchair with RN present giving medications. Pt agreeable to OT intervention and with no c/o pain this session. Pt requesting to shower and ambulates to bathroom with min hand held assistance. Pt needing min cues to sit on TTB for bathing at shower level. Pt initiates washing self with water but mod cues needed to initiate washing with soap and cloth. Pt needing mod cues for sequencing. Pt verbalized items he wished to wear this session and donned with steady assistance for balance. OT propelled pt into family room via wheelchair for coffee. Pt verbalized amount of sweetener and creamer he would like. Pt seated at table top with coffee,spoon, cream, and sugar. Pt required 2 minutes to initiate opening containers to place into coffee. Pt required supervision and min verbal cues for small sips. Quick release belt donned at end of session and pt returned to RN station for safety.   Therapy Documentation Precautions:  Precautions Precautions: Fall Precaution Comments: R shoulder sling Other Brace/Splint: has cortrax feeding tube Restrictions Weight Bearing Restrictions: Yes RUE Weight Bearing: Non weight bearing RLE Weight Bearing: Weight bearing as tolerated LLE Weight Bearing: Weight bearing as tolerated Vital Signs: Therapy Vitals Pulse Rate: 92 BP: 120/68 Pain: Pain Assessment Pain Assessment: 0-10 Pain Score: 7  Faces Pain Scale: No hurt Pain Type: Acute pain Pain Location: Leg Pain Orientation: Right;Left Pain Descriptors / Indicators: Aching Pain Frequency: Occasional Pain Onset:  Gradual Patients Stated Pain Goal: 0 Pain Intervention(s): Medication (See eMAR)  See Function Navigator for Current Functional Status.   Therapy/Group: Individual Therapy  Tyler Lane 07/24/2016, 10:01 AM

## 2016-07-24 NOTE — Progress Notes (Signed)
Occupational Therapy Session Note  Patient Details  Name: Tyler Lane MRN: [id number] Date of Birth: [birth year]  Today's Date: 07/24/2016 OT Individual Time: 1430-1500 OT Individual Time Calculation (min): 30 min    Short Term Goals: Week 3:  OT Short Term Goal 1 (Week 3): STGs=LTGs due to ELOS  Skilled Therapeutic Interventions/Progress Updates:    Treatment session with focus on orientation and recall of previously learned table top task.  Pt received at RN station reclined with eyes closed.  Pt reports fatigue from busy day and full lunch.  Pt only able to report eating potatoes with butter but no other specifics.  Engaged in old maid card activity to address recall.  Min cues for proper dealing of cards as pt misdealing.  Pt required increased time to organize to find matches of cards, even omitting some.  Min cues for problem solving.  Pt reporting month as August and year as 2016, reoriented to time and situation.  Returned to RN station to await next therapy session.  Therapy Documentation Precautions:  Precautions Precautions: Fall Precaution Comments: R shoulder sling Other Brace/Splint: has cortrax feeding tube Restrictions Weight Bearing Restrictions: Yes RUE Weight Bearing: Non weight bearing RLE Weight Bearing: Weight bearing as tolerated LLE Weight Bearing: Weight bearing as tolerated Pain: Pain Assessment Pain Score: 0-No pain  See Function Navigator for Current Functional Status.   Therapy/Group: Individual Therapy  Rosalio LoudHOXIE, Tyler Lane 07/24/2016, 3:41 PM

## 2016-07-24 NOTE — Progress Notes (Signed)
Speech Language Pathology Daily Session Notes  Patient Details  Name: Tyler AskewDavid Resh MRN: [id number] Date of Birth: 1950/04/12  Today's Date: 07/24/2016  Session 1: SLP Individual Time: 1030-1100 SLP Individual Time Calculation (min): 30 min   Session 1: SLP Concurrent Time: 1200-1230 SLP ConcurrentTime Calculation (min): 30 min  Short Term Goals: Week 3: SLP Short Term Goal 1 (Week 3): Patient will consume trials of Dys. 1 textures without overt s/s of aspiration and Mod A verbal cues for swallow initiation in less than 20% of opportunities over 2 sessions prior to upgrade.  SLP Short Term Goal 2 (Week 3): Patient will follow commands in 75% of opportunities with Mod A verbal cues.  SLP Short Term Goal 3 (Week 3): Patient will initiate functional tasks within 1 minute with Max A verbal cues . SLP Short Term Goal 4 (Week 3): Patient will demonstrate sustained attention to a functional task for ~2 minutes with Max A multimodal cues.  SLP Short Term Goal 5 (Week 3): Patient will orient to place and situation with Max A verbal and visual cues.   Skilled Therapeutic Interventions:  Session 1: Skilled treatment session focused on cognitive goals. SLP facilitated session by providing Mod A verbal and visual cues for problem solving and Max A verbal and visual cues for recall during a basic money management task. Patient demonstrated selective attention to task in a mildly distracting environment for ~20 minutes with Min A verbal cues for redirection. Patient with intermittent language of confusion throughout session. Patient left upright at RN station with quick release belt in place.   Session 2: Skilled treatment session focused on dysphagia goals. SLP facilitated session by providing skilled observation with lunch meal of Dys. 1 textures with thin liquids. Patient consumed 75% of meal without overt s/s of aspiration and required Min A verbal cues for use of small bites. Patient reported how much  he enjoyed the lunch meal throughout session. Recommend patient upgrade to Dys. 1 textures. Patient left upright at RN station. Continue with current plan of care.  Function:  Eating Eating   Modified Consistency Diet: Yes Eating Assist Level: Supervision or verbal cues;More than reasonable amount of time           Cognition Comprehension Comprehension assist level: Understands basic 50 - 74% of the time/ requires cueing 25 - 49% of the time  Expression   Expression assist level: Expresses basic 75 - 89% of the time/requires cueing 10 - 24% of the time. Needs helper to occlude trach/needs to repeat words.  Social Interaction Social Interaction assist level: Interacts appropriately 50 - 74% of the time - May be physically or verbally inappropriate.  Problem Solving Problem solving assist level: Solves basic 25 - 49% of the time - needs direction more than half the time to initiate, plan or complete simple activities  Memory Memory assist level: Recognizes or recalls 25 - 49% of the time/requires cueing 50 - 75% of the time    Pain No reports of pain   Therapy/Group: Individual Therapy  Chayce Rullo 07/24/2016, 3:32 PM

## 2016-07-25 ENCOUNTER — Inpatient Hospital Stay (HOSPITAL_COMMUNITY): Payer: Medicare HMO | Admitting: Physical Therapy

## 2016-07-25 ENCOUNTER — Inpatient Hospital Stay (HOSPITAL_COMMUNITY): Payer: Medicare HMO

## 2016-07-25 ENCOUNTER — Inpatient Hospital Stay (HOSPITAL_COMMUNITY): Payer: Medicare HMO | Admitting: Occupational Therapy

## 2016-07-25 ENCOUNTER — Inpatient Hospital Stay (HOSPITAL_COMMUNITY): Payer: Medicare HMO | Admitting: Speech Pathology

## 2016-07-25 LAB — GLUCOSE, CAPILLARY: GLUCOSE-CAPILLARY: 117 mg/dL — AB (ref 65–99)

## 2016-07-25 MED ORDER — DICLOFENAC SODIUM 1 % TD GEL
2.0000 g | Freq: Four times a day (QID) | TRANSDERMAL | Status: DC
  Administered 2016-07-25 – 2016-07-31 (×24): 2 g via TOPICAL
  Filled 2016-07-25 (×3): qty 100

## 2016-07-25 MED ORDER — ENSURE ENLIVE PO LIQD
237.0000 mL | Freq: Two times a day (BID) | ORAL | Status: DC
  Administered 2016-07-25 – 2016-07-30 (×8): 237 mL via ORAL

## 2016-07-25 MED ORDER — ACETAMINOPHEN 160 MG/5ML PO SOLN: 325.0000 mg | Freq: Four times a day (QID) | ORAL | 0 refills | Status: DC | PRN

## 2016-07-25 MED ORDER — TRAZODONE HCL 50 MG PO TABS: 50.0000 mg | ORAL_TABLET | Freq: Every evening | ORAL | Status: DC | PRN

## 2016-07-25 NOTE — Progress Notes (Signed)
Physical Therapy Session Note  Patient Details  Name: Tyler Lane MRN: [id number] Date of Birth: [birth year]  Today's Date: 07/25/2016 PT Individual Time: [id number] and [id number] PT Individual Time Calculation (min): 71 min and 32 min  Short Term Goals: Week 4:  PT Short Term Goal 1 (Week 4): Pt will demonstrate intellectual awareness with max cuing. PT Short Term Goal 2 (Week 4): Pt will consistently perform bed<>chair transfer with supervision.  Skilled Therapeutic Interventions/Progress Updates:  Treatment 1: Pt received in w/c & agreeable to tx. Pt able to recall being on floor this morning but unsure of what happened. Transported pt to room via w/c total assist for time management and pt donned shorts with assist to obtain clothing and supervision for him to don them. Pt applied dentures and performed hand hygiene at sink with min/mod verbal cuing. Pt requesting to eat breakfast and required assistance with identifying one food on plate. Pt required mod cuing for small bites, to swallow bolus, clear throat, and to drink from cup instead of attempting to obtain liquids with a fork. While pt consumed breakfast he engaged in orienting conversation; pt requires max cuing for awareness regarding physical injuries, current city, and date. Pt able to select hospital from choice of two. Pt ambulated with LUE supported on therapist's shoulder 2/2 R hip pain & antalgic gait. Trialed ambulation with small based quad cane with pt fairly demonstrating correct gait pattern but requiring min assist for balance. At end of session pt left sitting in w/c with QRB donned & at nurses station.   During session pt with reports and behaviors noting pain in R hip - pain meds requested.     Treatment 2: Pt received in room with daughter [name]([name]) exiting upon therapist's arrival. Session focused on gait training, balance, weight bearing, and activity tolerance. [facility]; pt  requires cuing to not advance AD too far out in front of him but with poor demo. Pt engaged in kick ball without AD and min assist for balance. Pt with decreased ability to weight shift to RLE 2/2 pain. Pt engaged in card matching; pt able to correctly match 9/9 cards without assistance. Pt returned to room & transferred sit>supine with use of UE to assist RLE into bed. At end of session pt left in bed in room with alarm set & call bell within reach.   Pt reports, "My legs are bothering me. I can feel them hurting." - rest breaks provided PRN.  Therapy Documentation Precautions:  Precautions Precautions: Fall Precaution Comments: R shoulder sling Other Brace/Splint: has cortrax feeding tube Restrictions Weight Bearing Restrictions: Yes RUE Weight Bearing: Non weight bearing RLE Weight Bearing: Weight bearing as tolerated LLE Weight Bearing: Weight bearing as tolerated    See Function Navigator for Current Functional Status.   Therapy/Group: Individual Therapy  Sandi MariscalVictoria M Beverly Suriano 07/25/2016, 3:50 PM

## 2016-07-25 NOTE — Discharge Summary (Signed)
Physician Discharge Summary  Patient ID: Tyler Lane MRN: [id number] DOB/AGE: 1950/05/08 66 y.o.  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 The patient suffered multiple rib fractures of fall from a ladder 06/21/2016. EXAM: CHEST  2 VIEW COMPARISON:  PA and lateral chest 06/29/2016. Single view of the chest 06/28/2016. CT chest 06/21/2016. FINDINGS: Right pleural effusion and basilar airspace disease have markedly improved. The left lung is clear. There is no pneumothorax. Heart size is normal. Aortic atherosclerosis is noted. Multiple right rib fractures are again seen. Feeding tube courses into the duodenum and below the inferior margin of the film. IMPRESSION: Marked improvement in a right pleural effusion basilar airspace disease. Multiple right rib fractures as seen on prior examinations. No pneumothorax. Electronically Signed   By: Drusilla Kanner M.D.   On: 07/03/2016 15:36   Dg Clavicle Right  Result Date: 07/25/2016 CLINICAL DATA:  Follow-up midshaft right clavicular fracture. EXAM: RIGHT CLAVICLE - 2+ VIEWS COMPARISON:  Right shoulder series of Jun 21, 2016 FINDINGS: The midshaft right clavicular fracture remains near anatomic in alignment. There is periosteal reaction surrounding the fracture lines. The [facility] joint is unremarkable. No acute re-fracture is observed. IMPRESSION: Ongoing healing of a midshaft right clavicular fracture. Electronically  Signed   By: Devaunte  [place] M.D.   On: 07/25/2016 13:19   Dg Knee 1-2 Views Left  Result Date: 07/25/2016 CLINICAL DATA:  66 year old male with bilateral knee pain. No known injury. EXAM: LEFT KNEE - 1-2 VIEW COMPARISON:  None. FINDINGS: Bone mineralization is normal for age. Joint spaces and alignment are normal for age. Patella intact. No joint effusion. No acute osseous abnormality identified. Mild calcified peripheral vascular disease. IMPRESSION: Normal for age radiographic appearance of the left knee aside from mild calcified peripheral vascular disease. Electronically Signed   By: Odessa Fleming M.D.   On: 07/25/2016 17:00   Dg Knee 1-2 Views Right  Result Date: 07/25/2016 CLINICAL DATA:  66 year old male with bilateral knee pain. No known injury. EXAM: RIGHT KNEE - 1-2 VIEW COMPARISON:  None. FINDINGS: AP and cross-table lateral views. No joint effusion. Patella appears intact. Joint spaces and alignment appear normal for age. Calcified peripheral vascular disease. No acute osseous abnormality identified. IMPRESSION: Normal for age radiographic appearance of the right knee aside from some calcified peripheral vascular disease. Electronically Signed   By: Odessa Fleming M.D.   On: 07/25/2016 17:00   Dg Abd 1 View  Result Date: 07/02/2016 CLINICAL DATA:  NG tube placement.  Smoker. EXAM: ABDOMEN - 1 VIEW COMPARISON:  CT abdomen and pelvis 06/21/2016 FINDINGS: Enteric tube tip extends to the mid abdomen to the left of midline. This is consistent with location in the second- third portion of the duodenum. Scattered gas and stool in the colon and scattered gas within the small bowel. No small or large bowel distention. No radiopaque stones visualized. Degenerative changes in the spine. IMPRESSION: Feeding tube tip localizes over the second- third portion of the  duodenum with appropriate apparent position. Electronically Signed   By: Burman Nieves M.D.   On: 07/02/2016 21:42   Labs:  Basic Metabolic Panel: BMP  Latest Ref Rng & Units 07/27/2016 07/23/2016 07/19/2016  Glucose 65 - 99 mg/dL 161(W) 960(A) 540(J)  BUN 6 - 20 mg/dL 9 19 18   Creatinine 0.61 - 1.24 mg/dL 8.11 9.14 7.82  Sodium 135 - 145 mmol/L 137 136 135  Potassium 3.5 - 5.1 mmol/L 3.8 3.9 4.2  Chloride 101 - 111 mmol/L 108 103 100(L)  CO2 22 - 32 mmol/L 21(L) 26 26  Calcium 8.9 - 10.3 mg/dL 9.5(A) 2.1(H) 8.9   Recent Labs   CBC: CBC Latest Ref Rng & Units 07/23/2016 07/16/2016 07/03/2016  WBC 4.0 - 10.5 K/uL 6.8 8.6 16.1(H)  Hemoglobin 13.0 - 17.0 g/dL 08.6 57.8 46.9  Hematocrit 39.0 - 52.0 % 46.5 48.6 47.0  Platelets 150 - 400 K/uL 199 216 408(H)    CBG:  Recent Labs Lab 07/24/16 1209 07/24/16 1647 07/24/16 2011 07/25/16 0008 07/26/16 2303  GLUCAP 155* 126* 128* 117* 112*    Brief HPI:   Tyler Lane a 66 y.o.malewith history of HTN, depression, hearing loss who was admitted on 06/21/16 after unwitnessed fall off 10' ladder. He was found down with GCS 8 at admission. CT head done showing mild acute SAH along sylvian fissures bilaterally, at high left parietal lobe and righ frontal lobe. Work up also revealed displaced fracture of right mid clavicle, multiple right rib fractures with pulmonary contusion, right pubic rami fracture and compression deformity of 6th and 7th thoracic vertebral body of uncertain etiology. MRI of brain showed parenchymal brain injury with multiple microhemorrhages in the right frontal lobe, left occipital lobe, and right thalamus--question DAI, small hemorrhagic contusion left superior frontal gyrus and left dorsal midbrain and trace SAH/IVH. EEG showed moderate generalized slowing due to non specific etiology.   Ortho/Dr. Eulah Pont consulted for input and recommended WBAT BLE and RUE with sling of comfort. He was treated with Rocephin for RLL PNA and cortack placed due to severe dysphagia. He remained NPO due to fluctuating bouts of lethargy alternating with bouts of agitation, difficulty following  simple commands, confabulation, difficulty with posture due to pain and had significant deficits in mobility and ability to carry out ADL tasks. CIR was recommended for follow up therapy.    Hospital Course: Omkar Stratmann was admitted to rehab 07/02/2016 for inpatient therapies to consist of PT, ST and OT at least three hours five days a week. Past admission physiatrist, therapy team and rehab RN have worked together to provide customized collaborative inpatient rehab. He continued to have bouts of lethargy and ritalin was added for activation.  Lethargy has resolved with increase in activity tolerance and blood pressures have been controlled on metoprolol. He completed 10 day course of Rocephin for MSSA  PNA and respiratory status has been stable. Acute urinary retention has resolved with addition of urecholine. UA/UCS was negative for infection and urecholine has been weaned off. . His tube feeds were changed to Nephro due to pre-renal azotemia and  Water flushes were added to help with renal status. Serial labs were monitored and renal status has improved with increased in water flushes.  Has swallow function and  Mentation has improved, his diet was advanced from clears to dysphagia 1. His intake remains poor and megace was added to stimulate appetite. Mild hyperglycemia noted due to tube feeds and SSI was used for BS control. Pain control has improved with  premedication prior to am and pm therapy sessions. Follow up X rays of shoulder were done showing fracture to be in anatomical alignment as well as evidence of healing.  ROM of RUE was initiated and he is to continue NWB RUE. He has had issues with sleep wake disturbance and trazodone is being used on prn basis. He is showing improvement in insight and awareness as well as ability to follow basic commands. He continues to require min to mod assist and  Family has elected on SNF for further therapy. [facility] [place] [facility]  [facility].   Rehab course: During patient's stay in rehab weekly team conferences were held to monitor patient's progress, set goals and discuss barriers to discharge. At admission, patient required max assist with bed mobility and to stand at edge of bed. He required max to total assist with basic self care tasks.  His cognitive tasks were limited by lethargy and he required Max-Total A for attention, intiation, orientation and basic problem solving. He has had improvement in activity tolerance, balance, postural control, as well as ability to compensate for deficits.   He is able to ambulate 300' X 2 with Min assist--LUE over therapist for support.  He is able to complete grooming tasks with supervision and needs set up with min assist for dressing. He requires mod cues for orientation and to sustain attention to basic tasks. He requires superivision with cues to complete bathing and min assist for dressing.  He is tolerating dysphagia 1 diet with mod cues to clear oral residue and to attend to eating he continues to have significant cognitive deficits requiring max to mod assist for problem solving and recall.      Disposition: Skilled Nursing Facility  Diet: Dysphagia 1, thin liquids  Special Instructions: 1. Needs full supervision at meals. Needs to be upright for meals.  2. Administer medications crushed in puree.    Discharge Instructions    Ambulatory referral to Physical Medicine Rehab    Complete by:  As directed    4 week follow up     Allergies as of 07/30/2016   No Known Allergies     Medication List    STOP taking these medications   citalopram 40 MG tablet Commonly known as:  CELEXA   hydrochlorothiazide 25 MG tablet Commonly known as:  HYDRODIURIL   ibuprofen 200 MG tablet Commonly known as:  ADVIL,MOTRIN     TAKE these medications   acetaminophen 160 MG/5ML solution Commonly known as:  TYLENOL Take 10.2-20.3 mLs (325-650 mg total) by mouth every 6 (six) hours  as needed for mild pain.   aspirin EC 81 MG tablet Take 81 mg by mouth daily.   calcium carbonate 500 MG chewable tablet Commonly known as:  TUMS - dosed in mg elemental calcium Chew 1-2 tablets by mouth daily as needed for indigestion or heartburn.   diclofenac sodium 1 % Gel Commonly known as:  VOLTAREN Apply 2 g topically 4 (four) times daily.   feeding supplement (ENSURE ENLIVE) Liqd Take 237 mLs by mouth 2 (two) times daily between meals.   hydrocerin Crea Apply 1 application topically as needed (Dry skin to feet).   HYDROcodone-acetaminophen 7.5-325 mg/15 ml solution Commonly known as:  HYCET Take 10 mLs by mouth every 6 (six) hours as needed for moderate pain.   loratadine 10 MG tablet Commonly known as:  CLARITIN Take 10 mg by mouth daily.   lovastatin 20 MG tablet Commonly known as:  MEVACOR Take 40 mg by mouth daily.   megestrol 400 MG/10ML suspension Commonly known as:  MEGACE Take 10 mLs (400 mg total) by mouth 2 (two) times daily.   methylphenidate 10 MG tablet Commonly known as:  RITALIN Take 1 tablet (10 mg total) by mouth 2 (two) times daily. With breakfast and lunch   metoprolol tartrate 25 mg/10 mL Susp Commonly known as:  LOPRESSOR Take 5 mLs (12.5 mg total) by mouth 2 (two) times daily.   nicotine 21 mg/24hr patch Commonly known as:  NICODERM CQ - dosed in mg/24 hours Place 1 patch (21 mg total) onto the skin daily.   polyethylene glycol packet Commonly known as:  MIRALAX / GLYCOLAX Take 17 g by mouth daily as needed for mild constipation.   traZODone 50 MG tablet Commonly known as:  DESYREL Take 1-2 tablets (50-100 mg total) by mouth at bedtime as needed for sleep.      Follow-up Information    Ranelle Oyster, MD Follow up.   Specialty:  Physical Medicine and Rehabilitation Why:  office will call you with follow up appointment Contact information: [address] [place] [address] [phone]        Maeola Harman, MD Follow up.   Specialty:  Neurosurgery Contact information: [address] [place] [address] [phone]        Sheral Apley, MD. Call.   Specialty:  Orthopedic Surgery Why:  for follow up appointment on pelvis and clavicel Contact information: [address] [place] [address] [phone]           Signed: Jacquelynn Cree 07/30/2016, 12:34 AM

## 2016-07-25 NOTE — Progress Notes (Signed)
Occupational Therapy Session Note  Patient Details  Name: Tyler AskewDavid Bogus MRN: [id number] Date of Birth: 04/27/1950  Today's Date: 07/25/2016 OT Individual Time: 1003-1100 and [id number] OT Individual Time Calculation (min): 57 min and 53 min    Short Term Goals: Week 3:  OT Short Term Goal 1 (Week 3): STGs=LTGs due to ELOS  Skilled Therapeutic Interventions/Progress Updates:    Session 1: Pt recieved at RN station, and pt without c/o pain this session. When asked, pt stated he did not need to use bathroom but therapist insisted. Pt ambulates 10' into bathroom with min HHA. Pt able to void immediately and required min A for balance with clothing management. Pt returned to wheelchair and OT propelled pt into day room. Pt seated at table top with 30 piece puzzle and picture to help identify location of pieces. Pt placing 11 pieces within 30 minutes with mod multimodal cues. Pt returning to RN station with quick release belt donned.   Session 2: Upon entering the room, pt supine in bed with daughter present in room. Daughter had questions regarding fall risk safety for pt while in room and in rehab facility. OT provided education to caregiver regarding changes/problem solving that have made for safety and she was satisfied with changes. Pt performed supine >sit with supervision and sat EOB while working on problem solving situational cards. Pt having to identify problem, what could potentially happen, and how to solve problem with mod multimodal cues. Pt remained seated on EOB with daughter seated next to him awaiting next therapist. Call bell and all needed items within reach.   Therapy Documentation Precautions:  Precautions Precautions: Fall Precaution Comments: R shoulder sling Other Brace/Splint: has cortrax feeding tube Restrictions Weight Bearing Restrictions: Yes RUE Weight Bearing: Non weight bearing RLE Weight Bearing: Weight bearing as tolerated LLE Weight Bearing: Weight bearing as  tolerated General:   Vital Signs: Therapy Vitals Pulse Rate: 100 BP: 119/82 Pain: Pain Assessment Pain Assessment: Faces Pain Score: 6  Faces Pain Scale: Hurts a little bit Pain Type: Acute pain Pain Location: Leg Pain Descriptors / Indicators: Discomfort Pain Onset: With Activity Pain Intervention(s): Repositioned  See Function Navigator for Current Functional Status.   Therapy/Group: Individual Therapy  [name], Tryniti Laatsch P 07/25/2016, 12:47 PM

## 2016-07-25 NOTE — Plan of Care (Signed)
Problem: RH PAIN MANAGEMENT Goal: RH STG PAIN MANAGED AT OR BELOW PT'S PAIN GOAL Manage pain at or above of 3/10 with prn medication   Outcome: Not Progressing Pain in knees 6/10; meds given

## 2016-07-25 NOTE — Progress Notes (Signed)
Speech Language Pathology Weekly Progress and Session Note  Patient Details  Name: Tyler Lane MRN: [id number] Date of Birth: 10-22-1950  Beginning of progress report period: July 18, 2016 End of progress report period: July 25, 2016  Today's Date: 07/25/2016 SLP Individual Time: 6389-3734 SLP Individual Time Calculation (min): 35 min  Short Term Goals: Week 3: SLP Short Term Goal 1 (Week 3): Patient will consume trials of Dys. 1 textures without overt s/s of aspiration and Mod A verbal cues for swallow initiation in less than 20% of opportunities over 2 sessions prior to upgrade.  SLP Short Term Goal 1 - Progress (Week 3): Met SLP Short Term Goal 2 (Week 3): Patient will follow commands in 75% of opportunities with Mod A verbal cues.  SLP Short Term Goal 2 - Progress (Week 3): Met SLP Short Term Goal 3 (Week 3): Patient will initiate functional tasks within 1 minute with Max A verbal cues . SLP Short Term Goal 3 - Progress (Week 3): Met SLP Short Term Goal 4 (Week 3): Patient will demonstrate sustained attention to a functional task for ~2 minutes with Max A multimodal cues.  SLP Short Term Goal 4 - Progress (Week 3): Met SLP Short Term Goal 5 (Week 3): Patient will orient to place and situation with Max A verbal and visual cues.  SLP Short Term Goal 5 - Progress (Week 3): Met    New Short Term Goals: Week 4: SLP Short Term Goal 1 (Week 4): Patient will consume current diet with minimal overt s/s of aspiration with supervision verbal cues.  SLP Short Term Goal 2 (Week 4): Patient will demonstrate efficient mastication of Dys. 2 textures with minimal oral residue and without overt s/s of aspiration with supervision verbal cues over 2 sessions prior to upgrade.  SLP Short Term Goal 3 (Week 4): Patient will demonstrate functional problem solving with Mod A verbal cues.  SLP Short Term Goal 4 (Week 4): Patient will recall daily information with Mod A multimodal cues.  SLP Short Term Goal  5 (Week 4): Patient will demonstrate selective attention to functional tasks in a mildly distracting enviornment for 15 minutes with Min A verbal cues for redirection.  Weekly Progress Updates: Patient has made functional gains and has met 5 of 5 STG's this reporting period. Currently, patient is consuming Dys. 1 textures with thin liquids without overt s/s of aspiration and supervision verbal cues for use of swallowing compensatory strategies. Patient demonstrates improved cognitive function and demonstrates behaviors consistent with a Rancho Level VI. Patient requires overall Mod-Max A verbal cues for selective attention, functional problem solving, recall and safety awareness. Patient and family education ongoing. Patient would benefit from continued skilled SLP intervention to maximize cognitive and swallowing function as well as overall functional independence prior to discharge.      Intensity: Minumum of 1-2 x/day, 30 to 90 minutes Frequency: 3 to 5 out of 7 days Duration/Length of Stay: 07/31/16 Treatment/Interventions: Dysphagia/aspiration precaution training;Environmental controls;Cueing hierarchy;Internal/external aids;Patient/family education;Cognitive remediation/compensation;Functional tasks;Therapeutic Activities   Daily Session  Skilled Therapeutic Interventions: Skilled treatment session focused on dysphagia and cognitive goals. SLP facilitated session by providing supervision verbal cues for tray set-up with lunch meal of Dys. 1 textures with thin liquids. Patient consumed meal without overt s/s of aspiration and supervision verbal cues for use of small bites. Patient demonsrated selective attention to meal in a mildly distracting environment for ~20 minutes with Mod A verbal cues for redirection. However, patient initiated self-feeding with Mod I. Patient's  daughter present and educated on current cognitive functioning and strategies to utilize to maximize safety. Soil scientist also  participated in conversation that focused on patient's safety to decrease fall risk. Patient left upright with RN. Continue with current plan of care.        Function:   Eating Eating   Modified Consistency Diet: Yes Eating Assist Level: Supervision or verbal cues;More than reasonable amount of time           Cognition Comprehension Comprehension assist level: Understands basic 50 - 74% of the time/ requires cueing 25 - 49% of the time  Expression   Expression assist level: Expresses basic 75 - 89% of the time/requires cueing 10 - 24% of the time. Needs helper to occlude trach/needs to repeat words.  Social Interaction Social Interaction assist level: Interacts appropriately 50 - 74% of the time - May be physically or verbally inappropriate.  Problem Solving Problem solving assist level: Solves basic 25 - 49% of the time - needs direction more than half the time to initiate, plan or complete simple activities  Memory Memory assist level: Recognizes or recalls 25 - 49% of the time/requires cueing 50 - 75% of the time   Pain Pain Assessment Pain Assessment: Faces Pain Score: 6  Faces Pain Scale: Hurts a little bit Pain Type: Acute pain Pain Location: Leg Pain Descriptors / Indicators: Discomfort Pain Onset: With Activity Pain Intervention(s): Repositioned  Therapy/Group: Individual Therapy  Tyler Lane 07/25/2016, 2:06 PM

## 2016-07-25 NOTE — Progress Notes (Signed)
Nutrition Follow-up  DOCUMENTATION CODES:   Not applicable  INTERVENTION:  Provide Ensure Enlive po BID, each supplement provides 350 kcal and 20 grams of protein.  Encourage adequate PO intake.   NUTRITION DIAGNOSIS:   Inadequate oral intake related to inability to eat as evidenced by NPO status; diet advanced; po 75-90%; improving  GOAL:   Patient will meet greater than or equal to 90% of their needs; progressing  MONITOR:   PO intake, Supplement acceptance, Diet advancement, Labs, Weight trends, TF tolerance, Skin, I & O's  REASON FOR ASSESSMENT:   Consult Enteral/tube feeding initiation and management  ASSESSMENT:   66 y.o. male with history of HTN, depression, hearing loss who was admitted on 06/21/16 after unwitnessed fall off 10' ladder. CT head done showing mild acute SAH along sylvian fissures bilaterally, at high left parietal lobe and righ frontal lobe. Work up also revealed displaced fracture of right mid clavicle, right 2nd to 6 rib fractures with pulmonary contusion, contusion, mildly displaced comminuted fracture of right pubic ramus and comminuted transverse fracture of proximal right pubic ramus and compression deformity of 6th and 7th thoracic vertebral body.  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  NGT out yesterday. Tube feeds have been discontinued. Diet advanced to a dysphagia 1 diet with thin liquids. Pt was unavailable during time of visit. Meal completion has been 75-90%. RD to order Ensure to aid in caloric and protein needs. RD to continue to  monitor.   Diet Order:  DIET - DYS 1 Room service appropriate? Yes; Fluid consistency: Thin  Skin:  Reviewed, no issues  Last BM:  6/8  Height:   Ht Readings from Last 1 Encounters:  06/21/16 5\' 11"  (1.803 m)    Weight:   Wt Readings from Last 1 Encounters:  07/25/16 170 lb (77.1 kg)    Ideal Body Weight:  78 kg  BMI:  Body mass index is 23.71 kg/m.  Estimated Nutritional Needs:   Kcal:  2050-2250  Protein:  110-125 grams  Fluid:  >2 L/day  EDUCATION NEEDS:   No education needs identified at this time  Roslyn SmilingStephanie Binh Doten, MS, RD, LDN Pager # [phone] After hours/ weekend pager # [phone]

## 2016-07-25 NOTE — Progress Notes (Signed)
Occupational Therapy Session Note  Patient Details  Name: Tyler AskewDavid Mackins MRN: [id number] Date of Birth: 1950-02-13  Today's Date: 07/25/2016 OT Individual Time: 0930-1000 OT Individual Time Calculation (min): 30 min    Short Term Goals: Week 2:  OT Short Term Goal 1 (Week 2): Pt will perform toilet transfer with min A. OT Short Term Goal 2 (Week 2): Pt will perform bathing at shower level with mod A and mod multimodal cues for routine task.   Skilled Therapeutic Interventions/Progress Updates:    Pt needed max assist for orientation to place, and month during session.  He was able to state he was at "rehab" but needed max questioning cueing for why he was here from a "fall".  He was able to work on simple problem solving 10 piece PVC puzzle, following diagram.  Max demonstrational cueing to complete task while sitting in tilt in space wheelchair.  Also had pt complete simple reasoning task of determining food groups from pictured bean bags.  He was 90 % correct on identifying the fruits and 70% correct on identifying the vegetables.  He did place watermelon in the vegetable category even though he had also placed it in with the fruits, stating "It depends on how you fix it".  Pt returned to nurses station at end of session in tilt in space wheelchair with safety belt in place.    Therapy Documentation Precautions:  Precautions Precautions: Fall Precaution Comments: R shoulder sling Other Brace/Splint: has cortrax feeding tube Restrictions Weight Bearing Restrictions: Yes RUE Weight Bearing: Non weight bearing RLE Weight Bearing: Weight bearing as tolerated LLE Weight Bearing: Weight bearing as tolerated  Pain: Pain Assessment Pain Assessment: Faces Pain Score: 0-No pain Faces Pain Scale: Hurts a little bit Pain Type: Acute pain Pain Location: Leg Pain Descriptors / Indicators: Discomfort Pain Onset: With Activity Pain Intervention(s): Repositioned ADL: See Function Navigator  for Current Functional Status.   Therapy/Group: Individual Therapy  Damariz Paganelli OTR/L 07/25/2016, 10:18 AM

## 2016-07-25 NOTE — Progress Notes (Signed)
Patient was found sitting in the floor in room 23 across the hall from his room with his underwear in his hands.  Pt then moved to nurse's station in wheelchair.  Pt denies pain at this time. No s/s of injury noted. Tyler Okaan, PA notified and Elita QuickPam, [place]. Attempted to call patient's family - no answer at this time.

## 2016-07-26 ENCOUNTER — Inpatient Hospital Stay (HOSPITAL_COMMUNITY): Payer: Medicare HMO | Admitting: Physical Therapy

## 2016-07-26 ENCOUNTER — Inpatient Hospital Stay (HOSPITAL_COMMUNITY): Payer: Medicare HMO | Admitting: Speech Pathology

## 2016-07-26 ENCOUNTER — Inpatient Hospital Stay (HOSPITAL_COMMUNITY): Payer: Medicare HMO | Admitting: Occupational Therapy

## 2016-07-26 LAB — GLUCOSE, CAPILLARY: Glucose-Capillary: 112 mg/dL — ABNORMAL HIGH (ref 65–99)

## 2016-07-26 MED ORDER — BETHANECHOL CHLORIDE 10 MG PO TABS
10.0000 mg | ORAL_TABLET | Freq: Three times a day (TID) | ORAL | Status: DC
  Administered 2016-07-26 – 2016-07-27 (×3): 10 mg via ORAL
  Filled 2016-07-26 (×3): qty 1

## 2016-07-26 NOTE — Progress Notes (Signed)
Physical Therapy Session Note  Patient Details  Name: Tyler AskewDavid Gutzwiller MRN: [id number] Date of Birth: 03-11-1950  Today's Date: 07/26/2016 PT Individual Time: 1445-1530 PT Individual Time Calculation (min): 45 min   Short Term Goals: Week 4:  PT Short Term Goal 1 (Week 4): Pt will demonstrate intellectual awareness with max cuing. PT Short Term Goal 2 (Week 4): Pt will consistently perform bed<>chair transfer with supervision.  Skilled Therapeutic Interventions/Progress Updates: Pt presented in w/c frustrated due to not receiving food. Pt able to recall year but not month. Per nsg snack ordered and should be up shortly. Pt agreeable to perform balance activities in room while awaiting for food to arrive. Pt performed rebounding with soft ball again wall for balance challenge. Pt able to tolerate rounds of 10 due to increased R knee pain. Pt requesting pain meds. Ambulated to nurse (with pt's LUE around PTA's shoulder), with nsg indicated pain meds recently received, then Pt ambulated around unit and day room requiring x 1 seated rest. Pt returned to room and performed toe taps 2 x 10 with SBQC for encouraging wt shifting with SBQC. Pt returned to w/c and QRB placed and pt transported to nsg station at end of session.      Therapy Documentation Precautions:  Precautions Precautions: Fall Precaution Comments: R shoulder sling Other Brace/Splint: has cortrax feeding tube Restrictions Weight Bearing Restrictions: Yes RUE Weight Bearing: Non weight bearing RLE Weight Bearing: Weight bearing as tolerated LLE Weight Bearing: Weight bearing as tolerated General:   Vital Signs:  Pain: Pain Assessment Pain Assessment: Faces Faces Pain Scale: No hurt  See Function Navigator for Current Functional Status.   Therapy/Group: Individual Therapy  Analeigh Aries  Katasha Riga, PTA  07/26/2016, 4:36 PM

## 2016-07-26 NOTE — Progress Notes (Addendum)
Physical Therapy Session Note  Patient Details  Name: Tyler AskewDavid Muscato MRN: [id number] Date of Birth: 06-02-1950  Today's Date: 07/26/2016 PT Individual Time: [id number] PT Individual Time Calculation (min): 89 min   Short Term Goals: Week 4:  PT Short Term Goal 1 (Week 4): Pt will demonstrate intellectual awareness with max cuing. PT Short Term Goal 2 (Week 4): Pt will consistently perform bed<>chair transfer with supervision.  Skilled Therapeutic Interventions/Progress Updates:  Pt agreeable to tx. Session focused on functional mobility & cognitive remediation. Pt had not had breakfast yet & was requesting to eat new tray. Pt consumed breakfast tray with cuing for small bites and to consume liquids. While pt consumed breakfast pt engaged in conversation regarding orientation. Pt was able to select that he is in a hospital from [facility] two and [facility] however pt unable to recall this later during session. Pt requires max/total assist for intellectual awareness regarding deficits. Pt ambulated throughout unit with min assist (LUE supported on therapist's shoulder) and pt ambulated unit<>outside [facility] entrance. Pt requires max cuing for pathfinding and awareness. Pt completed car transfer with min assist from sedan simulated height. During session pt donned shoes with shoe horn and set up assist. Pt engaged in pipe tree assembly for most simple shape (cross) with max cuing for attention to task and mod cuing for problem solving when choosing pipe from pre selected pieces. At end of session pt assisted back to bed & left with all alarm set & all needs within reach.   Addendum: Pt performed toilet transfer with supervision, (+) void during session.  Therapy Documentation Precautions:  Precautions Precautions: Fall Precaution Comments: R shoulder sling Other Brace/Splint: has cortrax feeding tube Restrictions Weight Bearing Restrictions: Yes RUE Weight Bearing:  Non weight bearing RLE Weight Bearing: Weight bearing as tolerated LLE Weight Bearing: Weight bearing as tolerated  Pain: Antalgic gait, difficulty weight bearing through RLE - rest breaks provided PRN.   See Function Navigator for Current Functional Status.   Therapy/Group: Individual Therapy  Sandi MariscalVictoria M Mateen Franssen 07/26/2016, 11:56 AM

## 2016-07-26 NOTE — Progress Notes (Signed)
Speech Language Pathology Daily Session Note  Patient Details  Name: Tyler AskewDavid Lane MRN: [id number] Date of Birth: [birth year]  Today's Date: 07/26/2016 SLP Individual Time: 2952-84130830-0925 SLP Individual Time Calculation (min): 55 min  Short Term Goals: Week 4: SLP Short Term Goal 1 (Week 4): Patient will consume current diet with minimal overt s/s of aspiration with supervision verbal cues.  SLP Short Term Goal 2 (Week 4): Patient will demonstrate efficient mastication of Dys. 2 textures with minimal oral residue and without overt s/s of aspiration with supervision verbal cues over 2 sessions prior to upgrade.  SLP Short Term Goal 3 (Week 4): Patient will demonstrate functional problem solving with Mod A verbal cues.  SLP Short Term Goal 4 (Week 4): Patient will recall daily information with Mod A multimodal cues.  SLP Short Term Goal 5 (Week 4): Patient will demonstrate selective attention to functional tasks in a mildly distracting enviornment for 15 minutes with Min A verbal cues for redirection.  Skilled Therapeutic Interventions: Skilled treatment session focused on cognitive goals. Patient appeared more lethargic this session compared to yesterday and declined donning his dentures for PO trials of upgraded textures this day. Patient was independently oriented to place and situation but required Mod A verbal cue for orientation to time. SLP also facilitated session by providing Max A question cues for reasoning during a verbal description task. Patient left upright in wheelchair at RN station with all needs within reach. Continue with current plan of care.      Function:  Cognition Comprehension Comprehension assist level: Understands basic 50 - 74% of the time/ requires cueing 25 - 49% of the time  Expression   Expression assist level: Expresses basic 75 - 89% of the time/requires cueing 10 - 24% of the time. Needs helper to occlude trach/needs to repeat words.  Social Interaction Social  Interaction assist level: Interacts appropriately 50 - 74% of the time - May be physically or verbally inappropriate.  Problem Solving Problem solving assist level: Solves basic 25 - 49% of the time - needs direction more than half the time to initiate, plan or complete simple activities  Memory Memory assist level: Recognizes or recalls 25 - 49% of the time/requires cueing 50 - 75% of the time    Pain No/Denies Pain   Therapy/Group: Individual Therapy  Tyler Lane 07/26/2016, 3:39 PM

## 2016-07-26 NOTE — Progress Notes (Signed)
Occupational Therapy Session Note  Patient Details  Name: Tyler AskewDavid Lane MRN: [id number] Date of Birth: 1950-06-01  Today's Date: 07/26/2016 OT Individual Time: 1300-1359 OT Individual Time Calculation (min): 59 min    Short Term Goals: Week 3:  OT Short Term Goal 1 (Week 3): STGs=LTGs due to ELOS  Skilled Therapeutic Interventions/Progress Updates:    Pt received sitting up in w/c at nurse's station. Assisted Pt via w/c to Pt bedroom for ADL completion. Pt completes short room level functional mobility to/from toilet with HHA. MinA during clothing management for toileting. Pt completed LB dressing with MinA to steady while standing to pull up underwear and pants. Pt completed grooming ADLs from w/c level at sink with setup. Assisted Pt to dayroom via w/c for focus on cognitive skills development. Pt in minimally distracting environment, required Mod verbal cues to answer orientation questions using visual aide. Presented Pt with photo book of family members with Pt able to identify names and family relation with approx 75% accuracy and Min verbal cues. Pt becoming more restless toward end of session and requires increased multimodal cues to remain focused on task at hand. Pt reporting being hungry, and provided with [place] ice snack while Pt engaged Pt additional cognitive development activities, identifying holidays and famous monuments/locations using visual aides and verbal cues throughout. Pt left seated in w/c, QRB donned at nurses station.   Therapy Documentation Precautions:  Precautions Precautions: Fall Precaution Comments: R shoulder sling Other Brace/Splint: has cortrax feeding tube Restrictions Weight Bearing Restrictions: Yes RUE Weight Bearing: Non weight bearing RLE Weight Bearing: Weight bearing as tolerated LLE Weight Bearing: Weight bearing as tolerated   Pain: Pain Assessment Pain Assessment: Faces Faces Pain Scale: No hurt     See Function Navigator for  Current Functional Status.   Therapy/Group: Individual Therapy  [name] 07/26/2016, 4:32 PM

## 2016-07-26 NOTE — Progress Notes (Signed)
Occupational Therapy Session Note  Patient Details  Name: Tyler AskewDavid Diffee MRN: [id number] Date of Birth: 12/08/1950  Today's Date: 07/26/2016 OT Individual Time: 1530-1630 OT Individual Time Calculation (min): 60 min    Short Term Goals: Week 3:  OT Short Term Goal 1 (Week 3): STGs=LTGs due to ELOS  Skilled Therapeutic Interventions/Progress Updates:    Pt seen for OT session addressing ADL re-training, orientation, and sequencing. Pt received at nurses station agreeable to tx session. He was voicing frustration that "I can't think like im suppose to" and demonstrated fair insight into deficits. Worked with pt throughout session regarding orientation to situation and time and pt's PLOF. Pt unable to answer open ended questions such as "what do you like to do for fun" and becoming hyperverbal about off subject matters. In [employer] gym, worked on sorting task by [employer], pt able to complete with increased time and min cues for word finding color names. He displayed decreased ability to sequence items following pattern of matching shape and colors provided by therapist. Pt beginning to "shut down" and stating he was "mentally drained and done". Pt agreeable to walk, and ambulated throughout unit with min A from therapist. Pt able to locate room once given room number by therapist, unable to recall room number or location independently. Upon return to room pt desiring to eat. He sat EOB to eat meal with supervision. Left seated EOB with hand off to NT to provide supervision assist and assist when pt done eating.   Therapy Documentation Precautions:  Precautions Precautions: Fall Precaution Comments: R shoulder sling Other Brace/Splint: has cortrax feeding tube Restrictions Weight Bearing Restrictions: Yes RUE Weight Bearing: Non weight bearing RLE Weight Bearing: Weight bearing as tolerated LLE Weight Bearing: Weight bearing as tolerated  See Function Navigator for Current Functional  Status.   Therapy/Group: Individual Therapy  Lewis, Wilhemina Grall C 07/26/2016, 4:56 PM

## 2016-07-26 NOTE — Plan of Care (Signed)
Problem: RH COGNITION-NURSING Goal: RH STG ANTICIPATES NEEDS/CALLS FOR ASSIST W/ASSIST/CUES STG Anticipates Needs/Calls for Assist With min  Assistance/Cues.   Outcome: Not Progressing Does not initialize verbalization of needs

## 2016-07-26 NOTE — Progress Notes (Signed)
[place] PHYSICAL MEDICINE & REHABILITATION     PROGRESS NOTE    Subjective/Complaints: Up at [employer] station. Denies pain. Glad NG is out.   ROS: pt denies nausea, vomiting, diarrhea, cough, shortness of breath or chest pain     Objective: Vital Signs: Blood pressure (!) 128/98, pulse 64, temperature 97.2 F (36.2 C), temperature source Oral, resp. rate 14, weight 71.5 kg (157 lb 9.6 oz), SpO2 100 %. Dg Clavicle Right  Result Date: 07/25/2016 CLINICAL DATA:  Follow-up midshaft right clavicular fracture. EXAM: RIGHT CLAVICLE - 2+ VIEWS COMPARISON:  Right shoulder series of Jun 21, 2016 FINDINGS: The midshaft right clavicular fracture remains near anatomic in alignment. There is periosteal reaction surrounding the fracture lines. [facility]. No acute re-fracture is observed. IMPRESSION: Ongoing healing of a midshaft right clavicular fracture. Electronically Signed   By: Jules  SwazilandJordan M.D.   On: 07/25/2016 13:19   Dg Knee 1-2 Views Left  Result Date: 07/25/2016 CLINICAL DATA:  66 year old male with bilateral knee pain. No known injury. EXAM: LEFT KNEE - 1-2 VIEW COMPARISON:  None. FINDINGS: Bone mineralization is normal for age. Joint spaces and alignment are normal for age. Patella intact. No joint effusion. No acute osseous abnormality identified. Mild calcified peripheral vascular disease. IMPRESSION: Normal for age radiographic appearance of the left knee aside from mild calcified peripheral vascular disease. Electronically Signed   By: Odessa FlemingH  Hall M.D.   On: 07/25/2016 17:00   Dg Knee 1-2 Views Right  Result Date: 07/25/2016 CLINICAL DATA:  66 year old male with bilateral knee pain. No known injury. EXAM: RIGHT KNEE - 1-2 VIEW COMPARISON:  None. FINDINGS: AP and cross-table lateral views. No joint effusion. Patella appears intact. Joint spaces and alignment appear normal for age. Calcified peripheral vascular disease. No acute osseous abnormality identified. IMPRESSION: Normal  for age radiographic appearance of the right knee aside from some calcified peripheral vascular disease. Electronically Signed   By: Odessa FlemingH  Hall M.D.   On: 07/25/2016 17:00   No results for input(s): WBC, HGB, HCT, PLT in the last 72 hours. No results for input(s): NA, K, CL, GLUCOSE, BUN, CREATININE, CALCIUM in the last 72 hours.  Invalid input(s): CO CBG (last 3)   Recent Labs  07/24/16 1647 07/24/16 2011 07/25/16 0008  GLUCAP 126* 128* 117*    Wt Readings from Last 3 Encounters:  07/26/16 71.5 kg (157 lb 9.6 oz)  07/02/16 74.3 kg (163 lb 12.8 oz)    Physical Exam:  Constitutional: He appears well-developed and well-nourished. NAD  HENT: Normocephalic. Atraumatic. +NG in place Eyes: EOMI. No discharge.  Cardiovascular:RRR without murmur. No JVD . Respiratory: CTA Bilaterally without wheezes or rales. Normal effort  GI: ND, BS+ Musculoskeletal: No edema, minimal right shoulder tenderness Neurological: He is alert and oriented to person only.  Follows basic commands.improving insight and awareness. Moves all 4 limbs Skin: Skin is warm and dry.  Psychiatric: cooperative   Assessment/Plan: 1. 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 Physiatrist is providing close team supervision and 24 hour management of active medical problems listed below. Physiatrist and rehab team continue to assess barriers to discharge/monitor patient progress toward functional and medical goals.  Function:  Bathing Bathing position Bathing activity did not occur: Refused Position: Shower  Bathing parts Body parts bathed by patient: Right arm, Left arm, Chest, Abdomen, Front perineal area, Buttocks, Right upper leg, Left upper leg, Right  lower leg, Left lower leg, Back Body parts bathed by helper: Back  Bathing assist Assist Level: Touching or steadying assistance(Pt > 75%)      Upper  Body Dressing/Undressing Upper body dressing   What is the patient wearing?: Pull over shirt/dress     Pull over shirt/dress - Perfomed by patient: Thread/unthread right sleeve, Thread/unthread left sleeve, Put head through opening, Pull shirt over trunk Pull over shirt/dress - Perfomed by helper: Thread/unthread right sleeve, Thread/unthread left sleeve, Put head through opening, Pull shirt over trunk        Upper body assist Assist Level: Supervision or verbal cues   Set up : To obtain clothing/put away  Lower Body Dressing/Undressing Lower body dressing   What is the patient wearing?: Pants, Non-skid slipper socks Underwear - Performed by patient: Thread/unthread right underwear leg, Thread/unthread left underwear leg, Pull underwear up/down   Pants- Performed by patient: Thread/unthread right pants leg, Thread/unthread left pants leg, Pull pants up/down Pants- Performed by helper: Thread/unthread right pants leg, Pull pants up/down Non-skid slipper socks- Performed by patient: Don/doff right sock, Don/doff left sock Non-skid slipper socks- Performed by helper: Don/doff right sock Socks - Performed by patient: Don/doff left sock, Don/doff right sock Socks - Performed by helper: Don/doff right sock, Don/doff left sock Shoes - Performed by patient: Don/doff right shoe, Don/doff left shoe Shoes - Performed by helper: Don/doff right shoe, Don/doff left shoe          Lower body assist Assist for lower body dressing: Touching or steadying assistance (Pt > 75%)      Toileting Toileting Toileting activity did not occur: No continent bowel/bladder event Toileting steps completed by patient: Adjust clothing prior to toileting, Adjust clothing after toileting, Performs perineal hygiene Toileting steps completed by helper: Performs perineal hygiene Toileting Assistive Devices: Grab bar or rail  Toileting assist Assist level: Touching or steadying assistance (Pt.75%)    Transfers Chair/bed transfer Chair/bed transfer activity did not occur: Safety/medical concerns Chair/bed transfer method: Stand pivot Chair/bed transfer assist level: Touching or steadying assistance (Pt > 75%) Chair/bed transfer assistive device: Armrests     Locomotion Ambulation Ambulation activity did not occur: Safety/medical concerns   Max distance: 100 ft Assist level: Touching or steadying assistance (Pt > 75%)   Wheelchair Wheelchair activity did not occur: Safety/medical concerns   Max wheelchair distance: 20' Assist Level: Maximal assistance (Pt 25 - 49%) (BLE)  Cognition Comprehension Comprehension assist level: Understands basic 50 - 74% of the time/ requires cueing 25 - 49% of the time  Expression Expression assist level: Expresses basic 75 - 89% of the time/requires cueing 10 - 24% of the time. Needs helper to occlude trach/needs to repeat words.  Social Interaction Social Interaction assist level: Interacts appropriately 50 - 74% of the time - May be physically or verbally inappropriate.  Problem Solving Problem solving assist level: Solves basic 25 - 49% of the time - needs direction more than half the time to initiate, plan or complete simple activities  Memory Memory assist level: Recognizes or recalls 25 - 49% of the time/requires cueing 50 - 75% of the time   Medical Problem List and Plan: 1.  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  DVT Prophylaxis/Anticoagulation: Mechanical: Sequential compression devices, below knee Bilateral lower extremities 3. Pain Management: Hydrocodone prn. Monitor for sedation.  4. Mood: [name] to follow for evaluation and support as mentation  improves.  5. Neuropsych: This patient is not capable of making decisions on his own behalf.  - ritalin for attention/arousal/initiation--continue at 10mg   6. Skin/Wound Care: routine  skin care 7. Fluids/Electrolytes/Nutrition/dysphagia: NGT removed  -continue PO, D1 thins, reasonable intake yesterday  8. Right displaced sacral and right pubic rami fracture: WBAT 9. Right clavicle fracture: NWB --   -shoulder xr with ongoing healing.  -advance ROM 10. Acute urinary retention:    toilet patient every 4 hours.   -ua neg, ucx neg also  -urecholine trial--wean to off 11. MSSA RLL PNA:  . Rocephin complete    -remains afebrile. No clinical signs of active disease  -pt on clear liquids now   -wbc's normal 13. HTN: On metoprolol bid.    Controlled   14. Hyperglycemia  Related to tube feeds  Cont to monitor    improved---dc cbg's 15. Sleep disturbance  Trazodone increased on 6/10  LOS (Days) 24 A FACE TO FACE EVALUATION WAS PERFORMED  Ranelle Oyster, MD 07/26/2016 9:26 AM

## 2016-07-27 ENCOUNTER — Inpatient Hospital Stay (HOSPITAL_COMMUNITY): Payer: Medicare HMO | Admitting: Physical Therapy

## 2016-07-27 ENCOUNTER — Inpatient Hospital Stay (HOSPITAL_COMMUNITY): Payer: Medicare HMO | Admitting: Occupational Therapy

## 2016-07-27 ENCOUNTER — Inpatient Hospital Stay (HOSPITAL_COMMUNITY): Payer: Medicare HMO | Admitting: *Deleted

## 2016-07-27 ENCOUNTER — Inpatient Hospital Stay (HOSPITAL_COMMUNITY): Payer: Medicare HMO | Admitting: Speech Pathology

## 2016-07-27 DIAGNOSIS — S42034D Nondisplaced fracture of lateral end of right clavicle, subsequent encounter for fracture with routine healing: Secondary | ICD-10-CM

## 2016-07-27 LAB — BASIC METABOLIC PANEL
Anion gap: 8 (ref 5–15)
BUN: 9 mg/dL (ref 6–20)
CHLORIDE: 108 mmol/L (ref 101–111)
CO2: 21 mmol/L — AB (ref 22–32)
CREATININE: 0.87 mg/dL (ref 0.61–1.24)
Calcium: 8.8 mg/dL — ABNORMAL LOW (ref 8.9–10.3)
GFR calc Af Amer: >60 mL/min (ref >60)
GFR calc non Af Amer: >60 mL/min (ref >60)
GLUCOSE: 106 mg/dL — AB (ref 65–99)
POTASSIUM: 3.8 mmol/L (ref 3.5–5.1)
SODIUM: 137 mmol/L (ref 135–145)

## 2016-07-27 MED ORDER — MEGESTROL ACETATE 400 MG/10ML PO SUSP: 400.0000 mg | Freq: Two times a day (BID) | ORAL | Status: DC

## 2016-07-27 NOTE — Progress Notes (Addendum)
Physical Therapy Session Note  Patient Details  Name: Tyler Lane MRN: [id number] Date of Birth: 03/01/1950  Today's Date: 07/27/2016 PT Individual Time: 1130-1200 and 1354-1435 PT Individual Time Calculation (min): 30 min and 41 min   Short Term Goals: Week 4:  PT Short Term Goal 1 (Week 4): Pt will demonstrate intellectual awareness with max cuing. PT Short Term Goal 2 (Week 4): Pt will consistently perform bed<>chair transfer with supervision.  Skilled Therapeutic Interventions/Progress Updates:  Treatment 1: Pt agreeable to tx. Pt noting pain in BLE - rest breaks provided & RN reports pt is premedicated. Session focused on functional mobility and cognitive remediation. Pt ambulated throughout unit with min assist & LUE supported on therapist's shoulder; pt with antalgic gait & difficulty weight bearing through RLE. Pt engaged in identifying plastic animals and did so with 90% accuracy & extra time. Pt engaged in game of checkers with max cuing to correctly set up board and mod cuing to correctly play game. At end of session pt left sitting in w/c with QRB donned & at nurses station.    Treatment 2: Pt received in w/c and agreeable to tx. Pt with antalgic gait, potentially due to pain in R hip & rest breaks provided PRN. Session focused on cognitive remediation (orientation, attention, path finding), functional mobility, and activity tolerance. Pt requesting to go outside and ambulated unit<>outside [facility] entrance and later throughout unit with min assist & LUE supported on therapist's shoulder. Pt with antalgic gait RLE and difficulty weight shifting to RLE. Pt required max cuing for pathfinding unit<>outside but with improving ability to recall what floor we needed to travel to. Outside, pt engaged in orientating conversation, pt able to recall correct month and location [facility] but otherwise required max/total assist for orientation and awareness regarding deficits. Back on unit pt  engaged in game of [place] to focus on word finding; pt requires mod assist to accurately and appropriately describe words, but able to guess words therapist was describing with only min cuing. At end of session pt left sitting in w/c at nurses station with QRB donned.   Without sling pt requires max cuing for NWB RUE but with poor demo.  Therapy Documentation Precautions:  Precautions Precautions: Fall Precaution Comments: R shoulder sling Other Brace/Splint: has cortrax feeding tube Restrictions Weight Bearing Restrictions: Yes RUE Weight Bearing: Non weight bearing RLE Weight Bearing: Weight bearing as tolerated LLE Weight Bearing: Weight bearing as tolerated    See Function Navigator for Current Functional Status.   Therapy/Group: Individual Therapy  Sandi MariscalVictoria M Ramses Klecka 07/27/2016, 4:16 PM

## 2016-07-27 NOTE — Progress Notes (Signed)
Speech Language Pathology Daily Session Note  Patient Details  Name: Tyler AskewDavid Zeitler MRN: [id number] Date of Birth: 1950/12/04  Today's Date: 07/27/2016 SLP Individual Time: 0730-0830 SLP Individual Time Calculation (min): 60 min  Short Term Goals: Week 4: SLP Short Term Goal 1 (Week 4): Patient will consume current diet with minimal overt s/s of aspiration with supervision verbal cues.  SLP Short Term Goal 2 (Week 4): Patient will demonstrate efficient mastication of Dys. 2 textures with minimal oral residue and without overt s/s of aspiration with supervision verbal cues over 2 sessions prior to upgrade.  SLP Short Term Goal 3 (Week 4): Patient will demonstrate functional problem solving with Mod A verbal cues.  SLP Short Term Goal 4 (Week 4): Patient will recall daily information with Mod A multimodal cues.  SLP Short Term Goal 5 (Week 4): Patient will demonstrate selective attention to functional tasks in a mildly distracting enviornment for 15 minutes with Min A verbal cues for redirection.  Skilled Therapeutic Interventions: Skilled treatment session focused on dysphagia and cognitive goals. Upon arrival, patient was awake while supine in bed and requested something to drink. SLP facilitated session by providing Mod A verbal cues for patient to donn shorts and socks for transfer to the wheelchair. Patient declined all basic self-care tasks prior to breakfast despite encouragement. Patient consumed breakfast of Dys. 1 textures with thin liquids with minimal overt s/s of aspiration but demonstrated prolonged mastication with what appeared to be minced sausage with moderate oral residue that cleared with a liquid wash. Recommend patient continue current diet of Dys. 1 textures. Patient with minimal PO intake and required Mod A verbal cues for sustained attention to self-feeding. Patient was independently oriented to place and situation but required Mod A verbal cue for orientation to time.  Patient  left upright in wheelchair at RN station with all needs within reach. Continue with current plan of care.      Function:  Eating Eating                 Cognition Comprehension Comprehension assist level: Understands basic 50 - 74% of the time/ requires cueing 25 - 49% of the time  Expression   Expression assist level: Expresses basic 75 - 89% of the time/requires cueing 10 - 24% of the time. Needs helper to occlude trach/needs to repeat words.  Social Interaction Social Interaction assist level: Interacts appropriately 50 - 74% of the time - May be physically or verbally inappropriate.  Problem Solving Problem solving assist level: Solves basic 25 - 49% of the time - needs direction more than half the time to initiate, plan or complete simple activities  Memory Memory assist level: Recognizes or recalls 25 - 49% of the time/requires cueing 50 - 75% of the time    Pain No/Denies Pain  Therapy/Group: Individual Therapy  Karess Harner 07/27/2016, 3:11 PM

## 2016-07-27 NOTE — Progress Notes (Signed)
Social Work Patient ID: Tyler Lane, male   DOB: 1950-07-10, 66 y.o.   MRN: [id number]   Have received SNF bed offer from [facility] and daughter has accepted.  Tentatively planning for admit on Monday 6/18 but still awaiting insurance coverage approval for SNF.  Will keep team posted.  Towana Stenglein, [name]

## 2016-07-27 NOTE — Progress Notes (Signed)
Occupational Therapy Session Note  Patient Details  Name: Tyler Lane MRN: [id number] Date of Birth: [birth year]  Today's Date: 07/27/2016 OT Individual Time: 4098-1191 OT Individual Time Calculation (min): 60 min   Week 3:  OT Short Term Goal 1 (Week 3): STGs=LTGs due to ELOS  Skilled Therapeutic Interventions/Progress Updates:    Tx focus on cognitive remediation, balance, and endurance during self care completion.   Pt greeted at RN station, agreeable to tx. Oriented pt to date/time with use of his therapy schedule with mod cues. Pt then expressed desire to shower. He was escorted back to room. He ambulated to dresser to [employer] (with Min A), instructional cue for retrieving 1/3 items. Pt then verbalized "First I need to pee." He placed clothing on bed and proceeded to toilet. Pt successful of voiding bladder and then initiated clothing removal while on toilet: "I guess now I have to get ready for my shower." Pt ambulated into shower, verbalized ADL item needs with extra time (i.e. Shampoo and soap). Pt requiring min cues for thoroughness and attending to all body areas. Pt standing with grab bars and Min A for pericare. After pt donned gripper socks on TTB, he required questioning cues to remember where he placed clothing (though he actively searched for them in bathroom, moving aside dirty clothing items). Pt located them at EOB, completed dressing with overall steady assist (pt donning undershirt and shirt in standing). Pt applying lotion to bilateral LEs without LOB. Pt encouraged to ambulate to sink to initiate grooming task completion. Pt looking into mirror, frowning, and retrieving comb to brush hair. Pt then returned to TIS. He was escorted to family room, continued with orientation conversation. He required cues for orientation to place and time (He thought he was in [facility]). He was able to verbalize that he was in rehab. Pt verbalizing he is here due to a fall  involving a power washer/walk board. Pt acknowledging deficits resulting from accident as "equilibrium" and "drive" ("If people weren't around to drive me, I'd be going all over the place."). At end of tx pt was reclined in TIS with safety belt donned at RN station. Per request, pictures of his dogs were placed in lap for stimulation.    OT had pt hold therapist's hand for R UE NWB during transfers with good success.  2nd Session 1:1 tx (30 min) Tx focus on problem solving, attention, and awareness during coffee prep task.   Pt greeted at RN station, verbalizing desire to prepare coffee. Pt escorted to family room. With extra time, he ambulated to countertop with Min A selected coffee (decaf) from small rack as well as as cream/sugars/straws nearby. Pt opening top corner of k-cup container and then placed it into coffee maker. Pt drinking coffee when it was finished, picking granules from teeth. OT inquired pt why granules were in coffee. Per pt "because I opened up the top." With suggestion to make another cup, pt consented "I think we should." Pt ambulated to counter to prepare another cup of coffee, able to set up machine and handle k-cup appropriately this time (only instruction provided was on method for refilling water reservior). Pt drank coffee while seated. He engaged in conversation regarding past work experience while  looking out of window "Do you see that machine? That was my life." (per pt, he was a Geophysical data processor). At end of tx pt was returned to RN station with safety belt donned.   Pt requesting pain medication for  LE pain at end of tx, limping during session, RN made aware.   Therapy Documentation Precautions:  Precautions Precautions: Fall Precaution Comments: R shoulder sling Other Brace/Splint: has cortrax feeding tube Restrictions Weight Bearing Restrictions: Yes RUE Weight Bearing: Non weight bearing RLE Weight Bearing: Weight bearing as tolerated LLE Weight Bearing:  Weight bearing as tolerated  Pain: Pain in bilateral LEs when walking, noticeably limp. RN made aware.  Pain Assessment Pain Score: 8  ADL:      See Function Navigator for Current Functional Status.   Therapy/Group: Individual Therapy  Akira Perusse A Azhia Siefken 07/27/2016, 12:21 PM
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
## 2016-07-28 ENCOUNTER — Inpatient Hospital Stay (HOSPITAL_COMMUNITY): Payer: Medicare HMO

## 2016-07-28 DIAGNOSIS — S062X3D Diffuse traumatic brain injury with loss of consciousness of 1 hour to 5 hours 59 minutes, subsequent encounter: Principal | ICD-10-CM

## 2016-07-28 NOTE — Progress Notes (Signed)
Occupational Therapy Session Note  Patient Details  Name: Tyler AskewDavid Lane MRN: [id number] Date of Birth: 05-01-1950  Today's Date: 07/28/2016 OT Individual Time: [id number] OT Individual Time Calculation (min): 27 min    Skilled Therapeutic Interventions/Progress Updates:    1;1. No pain reported. Pt changes sheets on mattress with CGA for ambulation throughout session and VC for upright posture not furniture walking throughout ADL apartment. Pt gathers needed sheets and pillow cases to make bed with min question cues to locate items and pull pillows off bed as pt tries to put fitted sheet over pillows. Pt with no LOB this session. Pt loads comforter and sheets into washing machine and requires step by step cues to turn washing machine on. While seated at nurses station with QRB donned, pt requests apple juice. OT cues to take small sips however pt has one episode of coughing after swallow. Exited session with pt seated with RN with QRB donned.    Therapy Documentation Precautions:  Precautions Precautions: Fall Precaution Comments: R shoulder sling Other Brace/Splint: has cortrax feeding tube Restrictions Weight Bearing Restrictions: Yes RUE Weight Bearing: Non weight bearing RLE Weight Bearing: Weight bearing as tolerated LLE Weight Bearing: Weight bearing as tolerated  See Function Navigator for Current Functional Status.   Therapy/Group: Individual Therapy  Tyler HaleStephanie M [name] 07/28/2016, 3:54 PM

## 2016-07-28 NOTE — Progress Notes (Signed)
 PHYSICAL MEDICINE & REHABILITATION     PROGRESS NOTE    Subjective/Complaints: Sitting in WC at nurses station. Talkative but doesn't put thoughts together.  Denies pain  Objective: Vital Signs: Blood pressure 112/78, pulse 85, temperature 98 F (36.7 C), temperature source Oral, resp. rate 20, weight 166 lb (75.3 kg), SpO2 100 %.   well-developed well-nourished male, disheveled  in no acute distress. HEENT exam atraumatic, normocephalic, neck supple without jugular venous distention. Chest clear to auscultation cardiac exam S1-S2 are regular. Abdominal examwith bowel sounds, soft and nontender. Extremities no edema. Neurologic exam is alert but pleaseantly confused.   Assessment/Plan: 1. 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  DVT Prophylaxis/Anticoagulation: Mechanical: Sequential compression devices, below knee Bilateral lower extremities 3. Pain Management: denies pain  4. Mood: [name] to follow for evaluation and support as mentation improves.  5. Neuropsych: This patient is not capable of making decisions on his own behalf.  - ritalin for attention/arousal/initiation--continue at 10mg   6. Skin/Wound Care: routine skin care 7. Fluids/Electrolytes/Nutrition/dysphagia: NGT removed   Basic Metabolic Panel:    Component Value Date/Time   NA 137 07/27/2016 0537   K 3.8 07/27/2016 0537   CL 108 07/27/2016 0537   CO2 21 (L) 07/27/2016 0537   BUN 9 07/27/2016 0537   CREATININE 0.87 07/27/2016 0537   GLUCOSE 106 (H) 07/27/2016 0537   CALCIUM 8.8 (L) 07/27/2016 0537   Continue megace.  8. Right displaced sacral and right pubic rami fracture: WBAT 9. Right clavicle fracture: NWB --   -shoulder xr with ongoing healing.  -advance ROM 10. Acute urinary retention:    toilet patient every 4 hours.   -ua neg, ucx neg  also  -urecholine trial--weaning to off 11. MSSA RLL PNA:  . Rocephin complete    -remains afebrile. No clinical signs of active disease  -pt on clear liquids now   -wbc's normal 13. HTN: On metoprolol bid.   Continue same meds  15. Sleep disturbance  Trazodone    LOS (Days) 26 A FACE TO FACE EVALUATION WAS PERFORMED  Lindley MagnusBruce H Alexyia Guarino, MD 07/28/2016 9:18 AM

## 2016-07-29 ENCOUNTER — Inpatient Hospital Stay (HOSPITAL_COMMUNITY): Payer: Medicare HMO
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
## 2016-07-29 NOTE — Progress Notes (Signed)
Occupational Therapy Session Note  Patient Details  Name: Tyler AskewDavid Lane MRN: [id number] Date of Birth: 09/30/1950  Today's Date: 07/29/2016 OT Individual Time: 1100-1156 OT Individual Time Calculation (min): 56 min    Short Term Goals: Week 3:  OT Short Term Goal 1 (Week 3): STGs=LTGs due to ELOS  Skilled Therapeutic Interventions/Progress Updates:    1:1. No c/o pain, however pt reporting "nipples are irritated." Focus of session on ADL retraining, cognitive remediation, and orientation. Utilized schedule to assist pt with MOD cueing to orient to situation, time (pt reporting it is august) and pt already oriented to location. Pt gathers clothes with question cueing to identify unnecessary items (3 shirts) prior to showering. Pt bathes at sit to stand level with supervision and question cueing to gather soap/washcloths and bathe all body parts. Pt dresses in standing with MIN A for balance while threading feet/donning shoes despite education on safety of dressing in seated. Pt sits EOB and shows OT photo album correctly identifying family members as written on back of photos. Pt requests to make coffee. Pt unable to recall doing this in previous session with other OT, however pt able to complete making coffee without VC except for scanning to find regular sugar. Exited session with pt seated in w/c with QRB donned positioned at nurses station.  Therapy Documentation Precautions:  Precautions Precautions: Fall Precaution Comments: R shoulder sling Other Brace/Splint: has cortrax feeding tube Restrictions Weight Bearing Restrictions: Yes RUE Weight Bearing: Non weight bearing RLE Weight Bearing: Weight bearing as tolerated LLE Weight Bearing: Weight bearing as tolerated  See Function Navigator for Current Functional Status.   Therapy/Group: Individual Therapy  Shon HaleStephanie M Azhia Siefken 07/29/2016, 12:01 PM

## 2016-07-30 ENCOUNTER — Inpatient Hospital Stay (HOSPITAL_COMMUNITY): Payer: Medicare HMO | Admitting: Speech Pathology

## 2016-07-30 ENCOUNTER — Inpatient Hospital Stay (HOSPITAL_COMMUNITY): Payer: Medicare HMO | Admitting: Physical Therapy

## 2016-07-30 ENCOUNTER — Inpatient Hospital Stay (HOSPITAL_COMMUNITY): Payer: Medicare HMO | Admitting: Occupational Therapy

## 2016-07-30 DIAGNOSIS — Z7409 Other reduced mobility: Secondary | ICD-10-CM

## 2016-07-30 DIAGNOSIS — G3184 Mild cognitive impairment, so stated: Secondary | ICD-10-CM

## 2016-07-30 DIAGNOSIS — I82409 Acute embolism and thrombosis of unspecified deep veins of unspecified lower extremity: Secondary | ICD-10-CM

## 2016-07-30 MED ORDER — NICOTINE 14 MG/24HR TD PT24
14.0000 mg | MEDICATED_PATCH | Freq: Every day | TRANSDERMAL | Status: DC
  Administered 2016-07-31: 14 mg via TRANSDERMAL
  Filled 2016-07-30: qty 1

## 2016-07-30 MED ORDER — MEGESTROL ACETATE 400 MG/10ML PO SUSP: 400.0000 mg | Freq: Two times a day (BID) | ORAL | 0 refills | Status: DC

## 2016-07-30 MED ORDER — ENSURE ENLIVE PO LIQD: 237.0000 mL | Freq: Two times a day (BID) | ORAL | 12 refills | Status: DC

## 2016-07-30 MED ORDER — POLYETHYLENE GLYCOL 3350 17 G PO PACK: 17.0000 g | PACK | Freq: Every day | ORAL | 0 refills | Status: DC | PRN

## 2016-07-30 MED ORDER — DICLOFENAC SODIUM 1 % TD GEL
2.0000 g | Freq: Four times a day (QID) | TRANSDERMAL | Status: AC
Start: 2016-07-30 — End: ?

## 2016-07-30 MED ORDER — HYDROCERIN EX CREA: 1.0000 "application " | TOPICAL_CREAM | CUTANEOUS | 0 refills | Status: DC | PRN

## 2016-07-30 MED ORDER — NICOTINE 21 MG/24HR TD PT24: 21.0000 mg | MEDICATED_PATCH | Freq: Every day | TRANSDERMAL | 0 refills | Status: DC

## 2016-07-30 MED ORDER — METHYLPHENIDATE HCL 10 MG PO TABS: 10.0000 mg | ORAL_TABLET | Freq: Two times a day (BID) | ORAL | 0 refills | Status: DC

## 2016-07-30 MED ORDER — HYDROCODONE-ACETAMINOPHEN 7.5-325 MG/15ML PO SOLN: 10.0000 mL | Freq: Four times a day (QID) | ORAL | 0 refills | Status: DC | PRN

## 2016-07-30 MED ORDER — MEGESTROL ACETATE 400 MG/10ML PO SUSP
400.0000 mg | Freq: Every day | ORAL | Status: DC
  Administered 2016-07-31: 400 mg via ORAL
  Filled 2016-07-30: qty 10

## 2016-07-30 MED ORDER — METOPROLOL TARTRATE 25 MG/10 ML ORAL SUSPENSION: 12.5000 mg | Freq: Two times a day (BID) | ORAL | Status: DC
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
## 2016-07-30 NOTE — Progress Notes (Signed)
Occupational Therapy Session Note  Patient Details  Name: Tyler AskewDavid Lane MRN: [id number] Date of Birth: 12-15-1950  Today's Date: 07/30/2016 OT Individual Time: 0702-0800 OT Individual Time Calculation (min): 58 min    Short Term Goals: Week 3:  OT Short Term Goal 1 (Week 3): STGs=LTGs due to ELOS  Skilled Therapeutic Interventions/Progress Updates:    Upon entering the room, pt supine in bed sleeping but agreeable to OT intervention. Pt with no c/o pain this session. Pt ambulates to dresser to obtain clothing items with overall supervision and without use of AD. Pt ambulates to bathroom for bathing at shower level with sit <>stand from TTB with steady assistance for balance. Pt donning clothing item from seated position on EOB with increased time and min verbal cues for initiation with increased time. Pt returns to sitting on EOB for breakfast with overall supervision for safety. Bed alarm activated and call bell within reach upon exiting the room.   Therapy Documentation Precautions:  Precautions Precautions: Fall Precaution Comments: R shoulder sling Other Brace/Splint: has cortrax feeding tube Restrictions Weight Bearing Restrictions: Yes RUE Weight Bearing: Non weight bearing RLE Weight Bearing: Weight bearing as tolerated LLE Weight Bearing: Weight bearing as tolerated  See Function Navigator for Current Functional Status.   Therapy/Group: Individual Therapy  [name], Sheniah Supak P 07/30/2016, 12:47 PM

## 2016-07-30 NOTE — Progress Notes (Signed)
Occupational Therapy Discharge Summary  Patient Details  Name: Tyler Lane MRN: [id number] Date of Birth: [birth year]    Patient has met 79 of 11 long term goals due to improved activity tolerance, improved balance, ability to compensate for deficits, improved attention, improved awareness and improved coordination.  Patient to discharge at overall supervision - min A level.  Family education not completed as pt will discharge to SNF for further therapeutic intervention.   Reasons goals not met: all goals met  Recommendation:  Patient will benefit from ongoing skilled OT services in skilled nursing facility setting to continue to advance functional skills in the area of BADL.  Equipment: defer to next venue of care  Reasons for discharge: treatment goals met  Patient/family agrees with progress made and goals achieved: Yes  OT Discharge Precautions/Restrictions  Precautions Precautions: Fall Restrictions RUE Weight Bearing: Non weight bearing Pain Pain Assessment Pain Assessment: No/denies pain Vision Baseline Vision/History: Wears glasses Cognition Overall Cognitive Status: Impaired/Different from baseline Arousal/Alertness: Awake/alert Orientation Level: Oriented to person;Oriented to situation;Disoriented to time;Oriented to place Attention: Selective;Sustained Sustained Attention: Appears intact Sustained Attention Impairment: Verbal basic;Functional basic Selective Attention: Impaired Selective Attention Impairment: Functional basic Memory: Impaired Memory Impairment: Decreased recall of new information;Storage deficit;Retrieval deficit Decreased Short Term Memory: Verbal basic;Functional basic Awareness: Impaired Awareness Impairment: Emergent impairment Problem Solving: Impaired Problem Solving Impairment: Functional basic Behaviors: Impulsive Safety/Judgment: Impaired Rancho [place] [employer]igos Scales of Cognitive Functioning: Confused/appropriate Motor   Motor Motor - Discharge Observations: generalized weakness Mobility  Bed Mobility Bed Mobility: Sit to Supine Sit to Supine: 5: Supervision;HOB elevated Transfers Sit to Stand: 5: Supervision Sit to Stand Details: Verbal cues for precautions/safety  Balance Balance Balance Assessed: Yes Extremity/Trunk Assessment RUE Assessment RUE Assessment: Within Functional Limits LUE Assessment LUE Assessment: Within Functional Limits   See Function Navigator for Current Functional Status.  Gypsy Decant 07/30/2016, 9:32 PM

## 2016-07-30 NOTE — Plan of Care (Signed)
Problem: RH Bed to Chair Transfers Goal: LTG Patient will perform bed/chair transfers w/assist (PT) LTG: Patient will perform bed/chair transfers with assistance, with/without cues (PT).  Outcome: Not Met (add Reason) Pt requires min assist for ambulatory bed<>chair  Problem: RH Ambulation Goal: LTG Patient will ambulate in controlled environment (PT) LTG: Patient will ambulate in a controlled environment, # of feet with assistance (PT).  Outcome: Completed/Met Date Met: 07/30/16 150 ft with LUE supported on therapist Goal: LTG Patient will ambulate in home environment (PT) LTG: Patient will ambulate in home environment, # of feet with assistance (PT).  Outcome: Completed/Met Date Met: 07/30/16 50 ft with LUE supported on therapist  Problem: RH Wheelchair Mobility Goal: LTG Patient will propel w/c in controlled environment (PT) LTG: Patient will propel wheelchair in controlled environment, # of feet with assist (PT)  Outcome: Adequate for Discharge Pt to d/c at ambulatory level   Problem: RH Stairs Goal: LTG Patient will ambulate up and down stairs w/assist (PT) LTG: Patient will ambulate up and down # of stairs with assistance (PT)  Outcome: Adequate for Discharge Pt able to negotiate 12 steps with single rail & steady assist

## 2016-07-30 NOTE — Progress Notes (Signed)
[facility] PHYSICAL MEDICINE & REHABILITATION     PROGRESS NOTE    Subjective/Complaints: Overall doing well. Has some concerns about his thinking and that it's not back to where it was before  ROS: pt denies nausea, vomiting, diarrhea, cough, shortness of breath or chest pain     Objective: Vital Signs: Blood pressure 127/85, pulse 88, temperature 98.4 F (36.9 C), temperature source Oral, resp. rate 18, weight 77.1 kg (170 lb), SpO2 94 %. No results found. No results for input(s): WBC, HGB, HCT, PLT in the last 72 hours. No results for input(s): NA, K, CL, GLUCOSE, BUN, CREATININE, CALCIUM in the last 72 hours.  Invalid input(s): CO CBG (last 3)  No results for input(s): GLUCAP in the last 72 hours.  Wt Readings from Last 3 Encounters:  07/29/16 77.1 kg (170 lb)  07/02/16 74.3 kg (163 lb 12.8 oz)    Physical Exam:  Constitutional: He appears well-developed and well-nourished. NAD  HENT: Normocephalic. Atraumatic.   Eyes: EOMI. No discharge.  Cardiovascular: RRR  . Respiratory: CTA Bilaterally without wheezes or rales. Normal effort   GI: ND, BS+ Musculoskeletal: No edema, no right shoulder tenderness. Full ROM Neurological: He is alert and oriented to person only.  Initiates conversation. Better insight. Moves all 4's Skin: Skin is warm and dry.  Psychiatric: cooperative   Assessment/Plan: 1. 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 Physiatrist is providing close team supervision and 24 hour management of active medical problems listed below. Physiatrist and rehab team continue to assess barriers to discharge/monitor patient progress toward functional and medical goals.  Function:  Bathing Bathing position Bathing activity did not occur: Refused Position: Systems developerhower  Bathing parts Body parts bathed by patient: Right arm, Left arm, Chest, Abdomen, Front  perineal area, Buttocks, Right upper leg, Left upper leg, Right lower leg, Left lower leg, Back Body parts bathed by helper: Back  Bathing assist Assist Level: Supervision or verbal cues      Upper Body Dressing/Undressing Upper body dressing   What is the patient wearing?: Pull over shirt/dress     Pull over shirt/dress - Perfomed by patient: Thread/unthread right sleeve, Thread/unthread left sleeve, Put head through opening, Pull shirt over trunk Pull over shirt/dress - Perfomed by helper: Thread/unthread right sleeve, Thread/unthread left sleeve, Put head through opening, Pull shirt over trunk        Upper body assist Assist Level: Supervision or verbal cues   Set up : To obtain clothing/put away  Lower Body Dressing/Undressing Lower body dressing   What is the patient wearing?: Pants, Underwear, Non-skid slipper socks Underwear - Performed by patient: Thread/unthread right underwear leg, Thread/unthread left underwear leg, Pull underwear up/down   Pants- Performed by patient: Thread/unthread right pants leg, Thread/unthread left pants leg, Pull pants up/down Pants- Performed by helper: Thread/unthread right pants leg, Pull pants up/down Non-skid slipper socks- Performed by patient: Don/doff right sock, Don/doff left sock Non-skid slipper socks- Performed by helper: Don/doff right sock Socks - Performed by patient: Don/doff left sock, Don/doff right sock Socks - Performed by helper: Don/doff right sock, Don/doff left sock Shoes - Performed by patient: Don/doff right shoe, Don/doff left shoe Shoes - Performed by helper: Don/doff right shoe, Don/doff left shoe          Lower body assist Assist for lower body dressing: Touching or steadying assistance (Pt > 75%)      Financial traderToileting Toileting Toileting  activity did not occur: No continent bowel/bladder event Toileting steps completed by patient: Adjust clothing prior to toileting, Performs perineal hygiene, Adjust clothing after  toileting Toileting steps completed by helper: Performs perineal hygiene Toileting Assistive Devices: Grab bar or rail  Toileting assist Assist level: Supervision or verbal cues   Transfers Chair/bed transfer Chair/bed transfer activity did not occur: Safety/medical concerns Chair/bed transfer method: Ambulatory Chair/bed transfer assist level: Supervision or verbal cues Chair/bed transfer assistive device: Armrests     Locomotion Ambulation Ambulation activity did not occur: Safety/medical concerns   Max distance: >150 ft Assist level: Touching or steadying assistance (Pt > 75%)   Wheelchair Wheelchair activity did not occur: Safety/medical concerns   Max wheelchair distance: 20' Assist Level: Maximal assistance (Pt 25 - 49%) (BLE)  Cognition Comprehension Comprehension assist level: Understands basic 90% of the time/cues < 10% of the time  Expression Expression assist level: Expresses basic 90% of the time/requires cueing < 10% of the time.  Social Interaction Social Interaction assist level: Interacts appropriately 90% of the time - Needs monitoring or encouragement for participation or interaction.  Problem Solving Problem solving assist level: Solves basic 90% of the time/requires cueing < 10% of the time  Memory Memory assist level: Recognizes or recalls 90% of the time/requires cueing < 10% of the time   Medical Problem List and Plan: 1.  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  DVT Prophylaxis/Anticoagulation: Mechanical: Sequential compression devices, below knee Bilateral lower extremities 3. Pain Management: Hydrocodone prn. Monitor for sedation.  4. Mood: [name] to follow for evaluation and support as mentation improves.  5. Neuropsych: This patient is not capable of making decisions on his own behalf.  - ritalin for  attention/arousal/initiation--continue at 10mg  for now---this has helped---can ween as outpt 6. Skin/Wound Care: routine skin care 7. Fluids/Electrolytes/Nutrition/dysphagia: NGT removed  -continue PO, D1 thins, intake much improved  -can continue megace at SNF and taper off over 1 week    8. Right displaced sacral and right pubic rami fracture: WBAT 9. Right clavicle fracture: NWB --   -shoulder xr with ongoing healing.  -advance ROM 10. Acute urinary retention:    toilet patient every 4 hours.   -ua neg, ucx neg also  -urecholine trial--weaning to off 11. MSSA RLL PNA:  . Rocephin complete    -remains afebrile. No clinical signs of active disease  -pt on d1/thins  -wbc's normal 13. HTN: On metoprolol bid.    Controlled   15. Sleep disturbance  Trazodone    LOS (Days) 28 A FACE TO FACE EVALUATION WAS PERFORMED  Ranelle Oyster, MD 07/30/2016 8:48 AM

## 2016-07-30 NOTE — Progress Notes (Signed)
Speech Language Pathology Discharge Summary  Patient Details  Name: Tyler Lane MRN: [id number] Date of Birth: [birth year]  Today's Date: 07/30/2016 SLP Individual Time: 0930-1000 SLP Individual Time Calculation (min): 30 min   Skilled Therapeutic Interventions:   Skilled treatment session focused on addressing dysphagia and cognition goals. SLP facilitated session by providing Min physical assist to transfer from bed to chair, as well as set-up assist of Dys.2 textures and thin liquids which patient then self-feed with increased time for oral manipulation and clearance of Dys.2 textures and no overt s/s of aspiration.  Patient also participated in conversation regarding orientation information, awareness of deficits, and what he will require assist for with Min-Mod assist question cues.  Patient fully intelligible with appropriate expression of basic wants and needs. Goals met and patient ready for discharge to next level of care.  Patient has met 4 of 4 long term goals.  Patient to discharge at overall Mod level.  Reasons goals not met: N/A   Clinical Impression/Discharge Summary:    Patient has made functional gains during this rehab admission and has met 4 out of 4 long term goals due to improved functional abilities.  Patient is currently an overall Mod assist for basic cognitive-linguistic tasks and requires Supervision assist for utilization of swallowing compensatory strategies to minimize overt s/s of aspiration with Dys.1 textures and thin liquids.  Patient and family education is on going patient will discharge to the next level of care with 24 hour supervision. Patient would benefit from follow up SLP services to continue efforts to maximize independence with cognitive-linguistic skills and to maximize his safety with advanced PO consistencies to further reduce the burden of care.   Care Partner:  Caregiver Able to Provide Assistance: No  Type of Caregiver Assistance:  Physical;Cognitive  Recommendation:  24 hour supervision/assistance;Skilled Nursing facility  Rationale for SLP Follow Up: Maximize functional communication;Maximize cognitive function and independence;Maximize swallowing safety;Reduce caregiver burden   Equipment: None   Reasons for discharge: Treatment goals met;Discharged from hospital   Patient/Family Agrees with Progress Made and Goals Achieved: Yes   Function:  Eating Eating   Modified Consistency Diet: Yes Eating Assist Level: Supervision or verbal cues;Set up assist for   Eating Set Up Assist For: Opening containers;Cutting food       Cognition Comprehension Comprehension assist level: Understands basic 75 - 89% of the time/ requires cueing 10 - 24% of the time  Expression   Expression assist level: Expresses basic needs/ideas: With extra time/assistive device  Social Interaction Social Interaction assist level: Interacts appropriately 90% of the time - Needs monitoring or encouragement for participation or interaction.  Problem Solving Problem solving assist level: Solves basic 50 - 74% of the time/requires cueing 25 - 49% of the time  Memory Memory assist level: Recognizes or recalls 50 - 74% of the time/requires cueing 25 - 49% of the time   Carmelia Roller., CCC-SLP [phone]  Tyler Lane 07/30/2016, 4:43 PM

## 2016-07-30 NOTE — Progress Notes (Signed)
Physical Therapy Discharge Summary  Patient Details  Name: Tyler Lane MRN: [id number] Date of Birth: Jul 11, 1950  Today's Date: 07/30/2016   Patient has met 8 of 9 long term goals due to improved activity tolerance, improved balance, improved postural control, increased strength, decreased pain, ability to compensate for deficits, improved attention, improved awareness and improved coordination.  Patient to discharge at an ambulatory level [place].     Reasons goals not met: pt requires min assist for ambulatory bed<>chair 2/2 decreased balance & decreased ability to weight bear through RLE  Recommendation:  Patient will benefit from ongoing skilled PT services in skilled nursing facility setting to continue to advance safe functional mobility, address ongoing impairments in decreased strength, continued pain in BLE, decreased balance, impaired cognition (memory, awareness, recall), decreased coordination, and minimize fall risk.  Equipment: No equipment provided, TBD in next venue of care.  Reasons for discharge: treatment goals met  Patient/family agrees with progress made and goals achieved: Yes  PT Discharge Precautions/Restrictions Precautions Precautions: Fall Restrictions Weight Bearing Restrictions: Yes RUE Weight Bearing: Non weight bearing  Vision/Perception  Pt wears glasses; difficult to assess vision 2/2 pt's impaired cognition.   Cognition Overall Cognitive Status: Impaired/Different from baseline Orientation Level: Oriented to person (inconsistently oriented to year) Memory: Impaired Memory Impairment: Decreased recall of new information;Decreased long term memory;Decreased short term memory Awareness: Impaired Awareness Impairment: Intellectual impairment Problem Solving: Impaired Safety/Judgment: Impaired Ranch[employer] Scales of Cognitive Functioning: Confused/appropriate  Motor  Motor Motor:  (general weakness)   Mobility Bed Mobility Bed  Mobility: Sit to Supine Sit to Supine: 5: Supervision;HOB elevated Transfers Transfers: Yes Sit to Stand: 5: Supervision Sit to Stand Details: Verbal cues for precautions/safety  Locomotion  Ambulation Ambulation: Yes Ambulation/Gait Assistance: 4: Min assist Ambulation Distance (Feet): 300 Feet Assistive device:  (LUE supported on therapist's shoulder) Gait Gait: Yes Gait Pattern: Decreased weight shift to right;Decreased stride length;Decreased stance time - right Stairs / Additional Locomotion Stairs: Yes Stairs Assistance: 4: Min guard Stair Management Technique: One rail Left Number of Stairs: 12 Height of Stairs:  (6 inches + 3 inches) Ramp: 4: Min Administrator Mobility: No   Extremity Assessment  RLE Assessment RLE Assessment: Within Functional Limits LLE Assessment LLE Assessment: Within Functional Limits   See Function Navigator for Current Functional Status.  [name] 07/30/2016, 1:03 PM

## 2016-07-31 NOTE — Progress Notes (Signed)
[facility] PHYSICAL MEDICINE & REHABILITATION     PROGRESS NOTE    Subjective/Complaints: Pt without complaints. Slept well.   ROS: Limited due to cognitive/behavioral      Objective: Vital Signs: Blood pressure 124/79, pulse 68, temperature 99 F (37.2 C), temperature source Oral, resp. rate 18, weight 78 kg (172 lb), SpO2 97 %. No results found. No results for input(s): WBC, HGB, HCT, PLT in the last 72 hours. No results for input(s): NA, K, CL, GLUCOSE, BUN, CREATININE, CALCIUM in the last 72 hours.  Invalid input(s): CO CBG (last 3)  No results for input(s): GLUCAP in the last 72 hours.  Wt Readings from Last 3 Encounters:  07/31/16 78 kg (172 lb)  07/02/16 74.3 kg (163 lb 12.8 oz)    Physical Exam:  Constitutional: He appears well-developed and well-nourished. NAD  HENT: Normocephalic. Atraumatic.   Eyes: EOMI. No discharge.  Cardiovascular: RRR  . Respiratory: CTA Bilaterally without wheezes or rales. Normal effort   GI: ND, BS+ Musculoskeletal: No edema, no right shoulder tenderness. Full ROM Neurological: He is alert and oriented to person only.  Initiates conversation. Better insight. Moves all 4's Skin: Skin is warm and dry.  Psychiatric: cooperative   Assessment/Plan: 1. 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 Physiatrist is providing close team supervision and 24 hour management of active medical problems listed below. Physiatrist and rehab team continue to assess barriers to discharge/monitor patient progress toward functional and medical goals.  Function:  Bathing Bathing position Bathing activity did not occur: Refused Position: Systems developerhower  Bathing parts Body parts bathed by patient: Right arm, Left arm, Chest, Abdomen, Front perineal area, Buttocks, Right upper leg, Left upper leg, Right lower leg, Left lower leg, Back Body parts bathed  by helper: Back  Bathing assist Assist Level: Supervision or verbal cues      Upper Body Dressing/Undressing Upper body dressing   What is the patient wearing?: Pull over shirt/dress     Pull over shirt/dress - Perfomed by patient: Thread/unthread right sleeve, Thread/unthread left sleeve, Put head through opening, Pull shirt over trunk Pull over shirt/dress - Perfomed by helper: Thread/unthread right sleeve, Thread/unthread left sleeve, Put head through opening, Pull shirt over trunk        Upper body assist Assist Level: Supervision or verbal cues   Set up : To obtain clothing/put away  Lower Body Dressing/Undressing Lower body dressing   What is the patient wearing?: Pants, Underwear, Non-skid slipper socks Underwear - Performed by patient: Thread/unthread right underwear leg, Thread/unthread left underwear leg, Pull underwear up/down   Pants- Performed by patient: Thread/unthread right pants leg, Thread/unthread left pants leg, Pull pants up/down Pants- Performed by helper: Thread/unthread right pants leg, Pull pants up/down Non-skid slipper socks- Performed by patient: Don/doff right sock, Don/doff left sock Non-skid slipper socks- Performed by helper: Don/doff right sock Socks - Performed by patient: Don/doff left sock, Don/doff right sock Socks - Performed by helper: Don/doff right sock, Don/doff left sock Shoes - Performed by patient: Don/doff right shoe, Don/doff left shoe Shoes - Performed by helper: Don/doff right shoe, Don/doff left shoe          Lower body assist Assist for lower body dressing: Touching or steadying assistance (Pt > 75%)      Toileting Toileting Toileting activity did not occur: No continent bowel/bladder event Toileting steps completed by patient: Adjust clothing prior to toileting, Performs perineal  hygiene, Adjust clothing after toileting Toileting steps completed by helper: Performs perineal hygiene Toileting Assistive Devices: Grab bar or  rail  Toileting assist Assist level: Supervision or verbal cues   Transfers Chair/bed transfer Chair/bed transfer activity did not occur: Safety/medical concerns Chair/bed transfer method: Ambulatory Chair/bed transfer assist level: Touching or steadying assistance (Pt > 75%) Chair/bed transfer assistive device: Armrests     Locomotion Ambulation Ambulation activity did not occur: Safety/medical concerns   Max distance: >150 ft Assist level: Touching or steadying assistance (Pt > 75%)   Wheelchair Wheelchair activity did not occur: Safety/medical concerns   Max wheelchair distance: 20' Assist Level: Maximal assistance (Pt 25 - 49%) (BLE)  Cognition Comprehension Comprehension assist level: Understands basic 75 - 89% of the time/ requires cueing 10 - 24% of the time  Expression Expression assist level: Expresses basic needs/ideas: With extra time/assistive device  Social Interaction Social Interaction assist level: Interacts appropriately 90% of the time - Needs monitoring or encouragement for participation or interaction.  Problem Solving Problem solving assist level: Solves basic 50 - 74% of the time/requires cueing 25 - 49% of the time  Memory Memory assist level: Recognizes or recalls 50 - 74% of the time/requires cueing 25 - 49% of the time   Medical Problem List and Plan: 1.  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  DVT Prophylaxis/Anticoagulation: Mechanical: Sequential compression devices, below knee Bilateral lower extremities 3. Pain Management: Hydrocodone prn. Monitor for sedation.  4. Mood: [name] to follow for evaluation and support as mentation improves.  5. Neuropsych: This patient is not capable of making decisions on his own behalf.  - ritalin for  attention/arousal/initiation--continue at 10mg  for now---this has helped---can ween as outpt 6. Skin/Wound Care: routine skin care 7. Fluids/Electrolytes/Nutrition/dysphagia: NGT removed  -continue PO, D1 thins, intake much improved  -can continue megace at SNF and dc after 1 week    8. Right displaced sacral and right pubic rami fracture: WBAT 9. Right clavicle fracture: NWB --   -shoulder xr with ongoing healing.  -advance ROM 10. Acute urinary retention:    toilet patient every 4 hours.   -ua neg, ucx neg also  -urecholine trial--weaning to off 11. MSSA RLL PNA:  . Rocephin complete    -remains afebrile. No clinical signs of active disease  -pt on d1/thins  -wbc's normal 13. HTN: On metoprolol bid.    Controlled   15. Sleep disturbance  Trazodone    LOS (Days) 29 A FACE TO FACE EVALUATION WAS PERFORMED  Ranelle Oyster, MD 07/31/2016 8:33 AM

## 2016-07-31 NOTE — Progress Notes (Signed)
Social Work Patient ID: Tyler Lane, male   DOB: 07/27/1950, 66 y.o.   MRN: [id number]   Have finally received SNF authorization from [employer] and pt will d/c to [facility] after lunch today.  Will transport via [facility] and daughter, Herbert SetaHeather, to meet him there.  Krithika Tome, [name]

## 2016-07-31 NOTE — [place] FL2 (Deleted)
[place] MEDICAID FL2 LEVEL OF CARE SCREENING TOOL     IDENTIFICATION  Patient Name: Tyler AskewDavid Popovich Birthdate: 1950/12/28 Sex: male Admission Date (Current Location): 07/02/2016  [facility] [place]   Facility and Address:  [facility] [place], [address], [place], [place] [address]      Provider Number: [id number]  Attending Physician Name and Address:  Ranelle OysterSwartz, Zachary T, MD  Relative Name and Phone Number:       Current Level of Care: Other (Comment) (Acute Inpatient Rehabiliation) Recommended Level of Care: Skilled Nursing Facility Prior Approval Number:    Date Approved/Denied:   PASRR Number: [id number]  Discharge Plan: SNF    Current Diagnoses: Patient Active Problem List   Diagnosis Date Noted  . Sleep disorder   . Hyperglycemia   . Benign essential HTN   . Dysphagia   . SAH (subarachnoid hemorrhage) (HCC) 07/02/2016  . Intracerebral hemorrhage, intraventricular (HCC) 07/02/2016  . Right clavicle fracture 07/02/2016  . Fracture of multiple ribs 07/02/2016  . Sacral fracture (HCC) 07/02/2016  . Chest trauma   . Closed fracture of one rib   . Urinary retention   . Pneumonia of right lower lobe due to methicillin susceptible Staphylococcus aureus (MSSA) (HCC)   . Prerenal azotemia   . 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 Fracture of clavicle, right, closed 06/22/2016  . TBI (traumatic brain injury) (HCC) 06/21/2016    Orientation RESPIRATION BLADDER Height & Weight     Self  Normal Continent (occasional incontinence usually at night) Weight: 78 kg (172 lb) Height:     BEHAVIORAL SYMPTOMS/MOOD NEUROLOGICAL BOWEL NUTRITION STATUS      Continent Diet  AMBULATORY STATUS COMMUNICATION OF NEEDS Skin   Limited Assist Verbally Normal                       Personal Care Assistance Level of Assistance  Bathing, Feeding, Dressing Bathing Assistance: Limited  assistance Feeding assistance:  (supervision for encouragement) Dressing Assistance: Limited assistance     Functional Limitations Info             SPECIAL CARE FACTORS FREQUENCY  PT (By licensed PT), OT (By licensed OT), Speech therapy     PT Frequency: 5x/wk OT Frequency: 5x/wk     Speech Therapy Frequency: 5x/sk      Contractures Contractures Info: Not present    Additional Factors Info  Code Status Code Status Info: Full             Current Medications (07/31/2016):  This is the current hospital active medication list Current Facility-Administered Medications  Medication Dose Route Frequency Provider Last Rate Last Dose  . acetaminophen (TYLENOL) solution 325-650 mg  325-650 mg Oral Q6H PRN Ranelle OysterSwartz, Zachary T, MD      . alum & mag hydroxide-simeth (MAALOX/MYLANTA) 200-200-20 MG/5ML suspension 30 mL  30 mL Oral Q4H PRN Ranelle OysterSwartz, Zachary T, MD   30 mL at 07/28/16 1808  . aspirin chewable tablet 81 mg  81 mg Oral Daily Ranelle OysterSwartz, Zachary T, MD   81 mg at 07/30/16 0915  . bisacodyl (DULCOLAX) suppository 10 mg  10 mg Rectal Daily PRN Jacquelynn CreeLove, Pamela S, PA-C   10 mg at 07/20/16 0449  . chlorhexidine (PERIDEX) 0.12 % solution 15 mL  15 mL Mouth Rinse BID Jacquelynn CreeLove, Pamela S, PA-C   15 mL at 07/30/16 2123  . diclofenac  sodium (VOLTAREN) 1 % transdermal gel 2 g  2 g Topical QID Love, Pamela S, PA-C   2 g at 07/30/16 2124  . diphenhydrAMINE (BENADRYL) 12.5 MG/5ML elixir 12.5-25 mg  12.5-25 mg Oral Q6H PRN Ranelle Oyster, MD   25 mg at 07/31/16 0109  . feeding supplement (ENSURE ENLIVE) (ENSURE ENLIVE) liquid 237 mL  237 mL Oral BID BM Ranelle Oyster, MD   237 mL at 07/30/16 0915  . guaiFENesin-dextromethorphan (ROBITUSSIN DM) 100-10 MG/5ML syrup 5-10 mL  5-10 mL Oral Q6H PRN Ranelle Oyster, MD      . hydrocerin (EUCERIN) cream   Topical PRN Plotnikov, Georgina Quint, MD      . hydrocerin (EUCERIN) cream   Topical BID Love, Pamela S, PA-C      . HYDROcodone-acetaminophen (HYCET) 7.5-325  mg/15 ml solution 10 mL  10 mL Oral BID WC Jacquelynn Cree, PA-C   10 mL at 07/31/16 0603  . HYDROcodone-acetaminophen (HYCET) 7.5-325 mg/15 ml solution 15 mL  15 mL Per Tube Q4H PRN Jacquelynn Cree, PA-C   15 mL at 07/30/16 2123  . lidocaine (XYLOCAINE) 2 % jelly   Topical PRN Love, Evlyn Kanner, PA-C      . LORazepam (ATIVAN) injection 0.5-1 mg  0.5-1 mg Intravenous Q4H PRN Love, Pamela S, PA-C      . MEDLINE mouth rinse  15 mL Mouth Rinse q12n4p Jacquelynn Cree, PA-C   15 mL at 07/30/16 1224  . megestrol (MEGACE) 400 MG/10ML suspension 400 mg  400 mg Oral Daily Faith Rogue T, MD      . methylphenidate (RITALIN) tablet 10 mg  10 mg Oral BID Ranelle Oyster, MD   10 mg at 07/31/16 0602  . metoprolol tartrate (LOPRESSOR) 25 mg/10 mL oral suspension 12.5 mg  12.5 mg Oral BID Ranelle Oyster, MD   12.5 mg at 07/30/16 2123  . nicotine (NICODERM CQ - dosed in mg/24 hours) patch 14 mg  14 mg Transdermal Daily Faith Rogue T, MD      . polyethylene glycol (MIRALAX / GLYCOLAX) packet 17 g  17 g Oral Daily PRN Faith Rogue T, MD      . pravastatin (PRAVACHOL) tablet 40 mg  40 mg Oral q1800 Jacquelynn Cree, PA-C   40 mg at 07/30/16 1745  . prochlorperazine (COMPAZINE) tablet 5-10 mg  5-10 mg Oral Q6H PRN Ranelle Oyster, MD       Or  . prochlorperazine (COMPAZINE) injection 5-10 mg  5-10 mg Intramuscular Q6H PRN Ranelle Oyster, MD       Or  . prochlorperazine (COMPAZINE) suppository 12.5 mg  12.5 mg Rectal Q6H PRN Ranelle Oyster, MD      . sodium phosphate (FLEET) 7-19 GM/118ML enema 1 enema  1 enema Rectal Once PRN Love, Pamela S, PA-C      . traZODone (DESYREL) tablet 50-100 mg  50-100 mg Oral QHS PRN Ranelle Oyster, MD   100 mg at 07/30/16 2123  . triamcinolone ointment (KENALOG) 0.1 %   Topical TID PRN Plotnikov, Georgina Quint, MD         Discharge Medications: Please see discharge summary for a list of discharge medications.  Relevant Imaging Results:  Relevant Lab  Results:   Additional Information SS#  [id number]  Amada Jupiter, [name]

## 2016-07-31 NOTE — Progress Notes (Signed)
[facility] Pt aware and reviewed plan of care and [facility]. No other questions noted. Await transport via ambulance. Pamelia HoitSharp, Eliott Amparan B

## 2016-08-01 NOTE — Progress Notes (Signed)
Social Work  Discharge Note  The overall goal for the admission was met for:   Discharge location: NO - plan changed to SNF  Length of Stay: Yes - 29 days (actually d/c'd on originally targeted d/c date)  Discharge activity level: Yes - min assist overall  Home/community participation: Yes  Services provided included: MD, RD, PT, OT, SLP, RN, TR, Pharmacy and [place]:  [employer] Medicare  Follow-up services arranged: Other: SNF placement at [facility];  transport via [place] (or additional information):  Patient/Family verbalized understanding of follow-up arrangements: Yes  Individual responsible for coordination of the follow-up plan: daughters  Confirmed correct DME delivered: NA  Coulson Wehner, [name]

## 2016-09-07 ENCOUNTER — Telehealth: Payer: Self-pay | Admitting: Physical Medicine & Rehabilitation

## 2016-09-07 NOTE — Telephone Encounter (Signed)
Left message on home number to call back and leave more information about medications and it may be Monday before we can address.

## 2016-09-07 NOTE — Telephone Encounter (Signed)
Genice RougeBonnie Lovell called to let us know that patient would be out of a couple of medications before patient comes into office.  I have tried calling her to find out which medications it was, no answer.  Please try calling her again at [phone].

## 2016-09-10 ENCOUNTER — Encounter: Payer: Medicare HMO | Attending: Physical Medicine & Rehabilitation | Admitting: Physical Medicine & Rehabilitation

## 2016-09-10 ENCOUNTER — Encounter: Payer: Self-pay | Admitting: Physical Medicine & Rehabilitation

## 2016-09-10 VITALS — BP 150/91 | HR 81

## 2016-09-10 DIAGNOSIS — Z8249 Family history of ischemic heart disease and other diseases of the circulatory system: Secondary | ICD-10-CM | POA: Diagnosis not present

## 2016-09-10 DIAGNOSIS — R4189 Other symptoms and signs involving cognitive functions and awareness: Secondary | ICD-10-CM | POA: Diagnosis not present

## 2016-09-10 DIAGNOSIS — N529 Male erectile dysfunction, unspecified: Secondary | ICD-10-CM | POA: Diagnosis not present

## 2016-09-10 DIAGNOSIS — S3210XD Unspecified fracture of sacrum, subsequent encounter for fracture with routine healing: Secondary | ICD-10-CM | POA: Insufficient documentation

## 2016-09-10 DIAGNOSIS — F419 Anxiety disorder, unspecified: Secondary | ICD-10-CM | POA: Diagnosis not present

## 2016-09-10 DIAGNOSIS — X58XXXD Exposure to other specified factors, subsequent encounter: Secondary | ICD-10-CM | POA: Insufficient documentation

## 2016-09-10 DIAGNOSIS — G479 Sleep disorder, unspecified: Secondary | ICD-10-CM | POA: Insufficient documentation

## 2016-09-10 DIAGNOSIS — E7800 Pure hypercholesterolemia, unspecified: Secondary | ICD-10-CM | POA: Insufficient documentation

## 2016-09-10 DIAGNOSIS — F17210 Nicotine dependence, cigarettes, uncomplicated: Secondary | ICD-10-CM | POA: Diagnosis not present

## 2016-09-10 DIAGNOSIS — Z833 Family history of diabetes mellitus: Secondary | ICD-10-CM | POA: Insufficient documentation

## 2016-09-10 DIAGNOSIS — S069X0D Unspecified intracranial injury without loss of consciousness, subsequent encounter: Secondary | ICD-10-CM | POA: Insufficient documentation

## 2016-09-10 DIAGNOSIS — R531 Weakness: Secondary | ICD-10-CM | POA: Insufficient documentation

## 2016-09-10 DIAGNOSIS — R42 Dizziness and giddiness: Secondary | ICD-10-CM | POA: Insufficient documentation

## 2016-09-10 DIAGNOSIS — Z79899 Other long term (current) drug therapy: Secondary | ICD-10-CM | POA: Diagnosis not present

## 2016-09-10 DIAGNOSIS — F329 Major depressive disorder, single episode, unspecified: Secondary | ICD-10-CM | POA: Insufficient documentation

## 2016-09-10 DIAGNOSIS — G3184 Mild cognitive impairment, so stated: Secondary | ICD-10-CM | POA: Diagnosis not present

## 2016-09-10 DIAGNOSIS — S32591D Other specified fracture of right pubis, subsequent encounter for fracture with routine healing: Secondary | ICD-10-CM | POA: Diagnosis not present

## 2016-09-10 DIAGNOSIS — I10 Essential (primary) hypertension: Secondary | ICD-10-CM | POA: Diagnosis not present

## 2016-09-10 DIAGNOSIS — S062X3S Diffuse traumatic brain injury with loss of consciousness of 1 hour to 5 hours 59 minutes, sequela: Secondary | ICD-10-CM

## 2016-09-10 MED ORDER — TRAZODONE HCL 50 MG PO TABS: 50.0000 mg | ORAL_TABLET | Freq: Every day | ORAL | 4 refills | Status: DC

## 2016-09-10 MED ORDER — METHYLPHENIDATE HCL 10 MG PO TABS: 5.0000 mg | ORAL_TABLET | Freq: Two times a day (BID) | ORAL | 0 refills | Status: DC

## 2016-09-10 NOTE — Patient Instructions (Signed)
PLEASE FEEL FREE TO CALL OUR OFFICE WITH ANY PROBLEMS OR QUESTIONS ([phone])      

## 2016-09-10 NOTE — Progress Notes (Signed)
Subjective:    Patient ID: Tyler AskewDavid Lane, male    DOB: 09/30/1950, 66 y.o.   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 He was at a SNF prior to going home. He was discharged last Tuesday. He has been wanting to play "golf"!  He has not had any therapy since being home as he was discharged upon eval. He is walking without a device. Orthopedics has discharged him. He hasn't fallen. Sleeping is going well. His appetite is good. His bowels and bladder are functioning well.   He does report erectile dysfunction.   His mood has been good/up beat. He does want to go to his own home and is anxious to get there. He's living with his daughter currently.   He remains on voltaren gel for his right shoulder and knees. He's also on ritalin for attention and arousal. He uses trazodone nightly to help with sleep.       Pain Inventory Average Pain 0 Pain Right Now 0 My pain is intermittent and aching  In the last 24 hours, has pain interfered with the following? General activity 0 Relation with others 0 Enjoyment of life 0 What TIME of day is your pain at its worst? night Sleep (in general) Fair  Pain is worse with: inactivity Pain improves with: . Relief from Meds: .  Mobility walk without assistance ability to climb steps?  yes  Function not employed: date last employed na  Neuro/Psych dizziness anxiety  Prior Studies Any changes since last visit?  no  Physicians involved in your care Any changes since last visit?  no   Family History  Problem Relation Age of Onset  . Diabetes Mother   . Heart attack Father    Social History   Social History  . Marital status: Significant Other    Spouse name: Genice RougeBonnie Lovell  . Number of children: 2  . Years of education: N/A   Occupational History  . retired    Social History Main Topics  . Smoking status: Current Every Day Smoker    Packs/day: 1.00    Years: 20.00    Types: Cigarettes  .  Smokeless tobacco: Never Used     Comment: patient unable to respond  . Alcohol use No  . Drug use: No  . Sexual activity: Not on file   Other Topics Concern  . Not on file   Social History Narrative  . No narrative on file   Past Surgical History:  Procedure Laterality Date  . NASAL SINUS SURGERY     Past Medical History:  Diagnosis Date  . Depression   . Heart murmur    previously  . High cholesterol   . HOH (hard of hearing)    mild in the right and moderate to severe in the left  . Hypertension   . Tinnitus of left ear    There were no vitals taken for this visit.  Opioid Risk Score:   Fall Risk Score:  `1  Depression screen PHQ 2/9  No flowsheet data found.   Review of Systems  Constitutional: Negative.   HENT: Negative.   Eyes: Negative.   Respiratory: Negative.   Cardiovascular: Negative.   Gastrointestinal: Positive for constipation.  Endocrine: Negative.   Genitourinary: Negative.   Musculoskeletal: Negative.   Skin: Negative.   Allergic/Immunologic: Negative.   Neurological: Negative.   Hematological: Negative.   Psychiatric/Behavioral: Negative.   All other systems reviewed and  are negative.      Objective:   Physical Exam Constitutional: He appears well-developed and well-nourished. NAD HENT: Normocephalic. Atraumatic.   Eyes: EOMI. No discharge.  Cardiovascular: RRR . Respiratory: CTA Bilaterally without wheezes or rales. Normal effort    GI: ND, BS+.  Musculoskeletal: No edema, no right shoulder tenderness. Full ROM in all limbs Neurological: He is alert and oriented to person, place.  Improved insight and awareness. Spelled "world" forward and backwards. Serial 7's 3/3. Sequences numbers with little hesitation, abstract thinking intact,  Recalled 0/3 words after 5 minutes. A little impulsive and distracted.  Strength 5/5. Sensory function intact. Balance functional although tandem gait a little unsteady Skin: Skin is warm and dry.    Psychiatric: cooperative but impulsive, slightly disinhibited.        Assessment & Plan:  Medical Problem List and Plan: 1. 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 .  -reviewed safety plan with daughter/pt   2. Pain Management: tylenol.  4. Mood: can resume celexa 5. Neuropsych:              -ritalin decreased to 5mg . Hope to wean off  -can be left alone 2hrs per day---can titrate up from there 6. Hx of right displaced sacral and right pubic rami fracture: WBAT 9. Right clavicle fracture: ortho signed off 11. Sleep disturbance             Trazodone can still be scheduled. 50mg  qhs renewed   Thirty minutes of face to face patient care time were spent during this visit. All questions were encouraged and answered. Follow up in about 2 months.

## 2016-09-13 ENCOUNTER — Telehealth: Payer: Self-pay | Admitting: *Deleted

## 2016-09-13 NOTE — Telephone Encounter (Signed)
Patient requesting Speech therapy [place] [facility].  Return[facility] phone call [facility] asking for suggestions. Was given contact number to [facility]. Contacted [facility] and explained patients circumstance. They said that is right up their wheelhouse.  Faxed over referral and demographics. Contacted Carol at [facility], contacted patient

## 2016-09-14 ENCOUNTER — Telehealth: Payer: Self-pay | Admitting: *Deleted

## 2016-09-14 NOTE — Telephone Encounter (Signed)
Tyler Lane has developed a problem with irritability and getting fixated on things and becoming irritable. This is a new problem for "them" and they do not know any way to handle it.  Tyler Genice Rouge(Bonnie Lovell) is asking for a call back ph # [phone] or [phone]

## 2016-09-24 NOTE — Telephone Encounter (Signed)
Called and discussed with daughter today.  He is using celexa at night and only on ritalin 5mg  daily right now

## 2016-10-01 ENCOUNTER — Other Ambulatory Visit: Payer: Self-pay

## 2016-10-01 MED ORDER — TRAZODONE HCL 50 MG PO TABS: 50.0000 mg | ORAL_TABLET | Freq: Every day | ORAL | 4 refills | Status: DC

## 2016-10-05 ENCOUNTER — Other Ambulatory Visit: Payer: Self-pay

## 2016-10-05 ENCOUNTER — Other Ambulatory Visit: Payer: Self-pay | Admitting: Physical Medicine & Rehabilitation

## 2016-10-05 MED ORDER — TRAZODONE HCL 50 MG PO TABS: 50.0000 mg | ORAL_TABLET | Freq: Every day | ORAL | 4 refills | Status: DC

## 2016-10-05 NOTE — Telephone Encounter (Signed)
Patients family called and states because SNF prescribed trazadone 50 mg - he cannot get refill would like [facility] to pick up RX - to [facility] [address]

## 2016-10-05 NOTE — Telephone Encounter (Signed)
Medication ordered already on 10/01/2016 with 4 refills

## 2016-10-22 ENCOUNTER — Encounter: Payer: Medicare HMO | Admitting: Physical Medicine & Rehabilitation

## 2016-11-13 ENCOUNTER — Telehealth: Payer: Self-pay | Admitting: Physical Medicine & Rehabilitation

## 2016-11-13 NOTE — Telephone Encounter (Signed)
PTN'S daughter [name] is trying to help step mom with obtaining additional assistance - getting medicaid and care.  She states stepmom may be overwhelmed and doesn't understand TBI.  She is asking if he can get home health to assist with ADL type appt in the home to show both of them how to help him get back to some level of normal.

## 2016-11-13 NOTE — Telephone Encounter (Signed)
Don't mind helping out as much as we can, but I don't believe he's home bound, is he?  They are taking him out of the house for non-medical reasons (ie leisure activities), correct??

## 2016-11-19 ENCOUNTER — Encounter: Payer: Self-pay | Admitting: Physical Medicine & Rehabilitation

## 2016-11-19 ENCOUNTER — Encounter: Payer: Medicare HMO | Attending: Physical Medicine & Rehabilitation | Admitting: Physical Medicine & Rehabilitation

## 2016-11-19 VITALS — BP 111/61 | HR 38

## 2016-11-19 DIAGNOSIS — G479 Sleep disorder, unspecified: Secondary | ICD-10-CM | POA: Insufficient documentation

## 2016-11-19 DIAGNOSIS — F329 Major depressive disorder, single episode, unspecified: Secondary | ICD-10-CM | POA: Diagnosis not present

## 2016-11-19 DIAGNOSIS — Z8249 Family history of ischemic heart disease and other diseases of the circulatory system: Secondary | ICD-10-CM | POA: Insufficient documentation

## 2016-11-19 DIAGNOSIS — I10 Essential (primary) hypertension: Secondary | ICD-10-CM | POA: Diagnosis not present

## 2016-11-19 DIAGNOSIS — F17210 Nicotine dependence, cigarettes, uncomplicated: Secondary | ICD-10-CM | POA: Insufficient documentation

## 2016-11-19 DIAGNOSIS — N529 Male erectile dysfunction, unspecified: Secondary | ICD-10-CM | POA: Diagnosis not present

## 2016-11-19 DIAGNOSIS — S32591D Other specified fracture of right pubis, subsequent encounter for fracture with routine healing: Secondary | ICD-10-CM | POA: Diagnosis not present

## 2016-11-19 DIAGNOSIS — R531 Weakness: Secondary | ICD-10-CM | POA: Insufficient documentation

## 2016-11-19 DIAGNOSIS — G3184 Mild cognitive impairment, so stated: Secondary | ICD-10-CM

## 2016-11-19 DIAGNOSIS — F419 Anxiety disorder, unspecified: Secondary | ICD-10-CM | POA: Diagnosis not present

## 2016-11-19 DIAGNOSIS — Z79899 Other long term (current) drug therapy: Secondary | ICD-10-CM | POA: Insufficient documentation

## 2016-11-19 DIAGNOSIS — R42 Dizziness and giddiness: Secondary | ICD-10-CM | POA: Diagnosis not present

## 2016-11-19 DIAGNOSIS — Z833 Family history of diabetes mellitus: Secondary | ICD-10-CM | POA: Diagnosis not present

## 2016-11-19 DIAGNOSIS — R4189 Other symptoms and signs involving cognitive functions and awareness: Secondary | ICD-10-CM | POA: Insufficient documentation

## 2016-11-19 DIAGNOSIS — E7800 Pure hypercholesterolemia, unspecified: Secondary | ICD-10-CM | POA: Diagnosis not present

## 2016-11-19 DIAGNOSIS — S069X0D Unspecified intracranial injury without loss of consciousness, subsequent encounter: Secondary | ICD-10-CM | POA: Insufficient documentation

## 2016-11-19 DIAGNOSIS — S3210XD Unspecified fracture of sacrum, subsequent encounter for fracture with routine healing: Secondary | ICD-10-CM | POA: Diagnosis not present

## 2016-11-19 DIAGNOSIS — S062X3S Diffuse traumatic brain injury with loss of consciousness of 1 hour to 5 hours 59 minutes, sequela: Secondary | ICD-10-CM

## 2016-11-19 MED ORDER — METHYLPHENIDATE HCL ER (LA) 10 MG PO CP24: 10.0000 mg | ORAL_CAPSULE | Freq: Every day | ORAL | 0 refills | Status: DC

## 2016-11-19 NOTE — Patient Instructions (Signed)
CONTINUE TO WORK ON INDEPENDENCE AT HOME. TRY SOME SIMPLE MEAL PREPARATION.    REDUCE METOPROLOL TO 12.5MG  TWICE DAILY FOR 3 DAYS, IF HEART RATE STAYS BELOW 60 THEN STOP METOPROLOL COMPLETELY   PLEASE FEEL FREE TO CALL OUR OFFICE WITH ANY PROBLEMS OR QUESTIONS [phone])

## 2016-11-19 NOTE — Progress Notes (Signed)
Subjective:    Patient ID: Tyler Lane, male    DOB: Sep 28, 1950, 66 y.o.   MRN: [id number]  HPI   Mr. Catlin is here in follow up of his TBI. He ended up going to [facility] in [place] as it was more convenient. Now he's back in [facility] and will need to see someone in [place].   According to his wife his living arrangement "has changed". He is back at home with her and alone during the day while she works, typically up to 8 hours. He has done some mechanical work, getting "his jeep back up running". He does his laundry.   He is taking ritalin only  daily. He is on metoprolol  bid as well.     Pain Inventory Average Pain 5 Pain Right Now 5 My pain is intermittent  In the last 24 hours, has pain interfered with the following? General activity 2 Relation with others 2 Enjoyment of life 2 What TIME of day is your pain at its worst? morning Sleep (in general) Fair  Pain is worse with: some activites Pain improves with: . Relief from Meds: 0  Mobility walk without assistance do you drive?  no  Function not employed: date last employed .  Neuro/Psych weakness numbness  Prior Studies Any changes since last visit?  no  Physicians involved in your care Any changes since last visit?  no   Family History  Problem Relation Age of Onset  . Diabetes Mother   . Heart attack Father    Social History   Social History  . Marital status: Significant Other    Spouse name: Genice Rouge  . Number of children: 2  . Years of education: N/A   Occupational History  . retired    Social History Main Topics  . Smoking status: Current Every Day Smoker    Packs/day: 1.00    Years: 20.00    Types: Cigarettes  . Smokeless tobacco: Never Used     Comment: patient unable to respond  . Alcohol use No  . Drug use: No  . Sexual activity: Not on file   Other Topics Concern  . Not on file   Social History Narrative  . No narrative on file   Past  Surgical History:  Procedure Laterality Date  . NASAL SINUS SURGERY     Past Medical History:  Diagnosis Date  . Depression   . Heart murmur    previously  . High cholesterol   . HOH (hard of hearing)    mild in the right and moderate to severe in the left  . Hypertension   . Tinnitus of left ear    There were no vitals taken for this visit.  Opioid Risk Score:   Fall Risk Score:  `1  Depression screen PHQ 2/9  No flowsheet data found.   Review of Systems  Constitutional: Negative.   HENT: Negative.   Eyes: Negative.   Respiratory: Negative.   Cardiovascular: Negative.   Gastrointestinal: Negative.   Endocrine: Negative.   Genitourinary: Negative.   Musculoskeletal: Positive for joint swelling.  Skin: Negative.   Allergic/Immunologic: Negative.   Neurological: Negative.   Hematological: Negative.   Psychiatric/Behavioral: Negative.   All other systems reviewed and are negative.      Objective:   Physical Exam  Constitutional: He appears well-developed and well-nourished. NAD HENT: Normocephalic. Atraumatic.  Eyes: EOMI. No discharge.  Cardiovascular: bradycardia 40bpm. Respiratory: CTA Bilaterally without wheezes or rales. Normal effort  GI: ND, BS+.  Musculoskeletal: No edema, no right shoulder tenderness. Full ROM in all limbs, mild tenderness right shoulder with PROM Neurological: He is alert and oriented to person, place.  Improved insight and awareness. Improving memory and attention. Strength 5/5. Good balance.  Skin: Skin is warm and dry.  Psychiatric:   remains slightly disinhibited but improving.        Assessment & Plan:  Medical Problem List and Plan: 1. 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 .             -continue with HEP 2.  Pain Management: tylenol.  4. Mood: can resume celexa 5. Neuropsych:  -ritalin continue at  bid, will try ritalin [place]  daily insurance permitting             -can be left alone at home. Encouraged him to work on simple meal prep, etc, expand horizons!  6. Hx of right displaced sacral and right pubic rami fracture: WBAT 9. Right clavicle fracture: ortho signed off 11. Sleep disturbance Trazodone  scheduled.  qhs     15 minutes of face to face patient care time were spent during this visit. All questions were encouraged and answered. Follow up in about 1 months.

## 2016-12-17 ENCOUNTER — Encounter: Payer: Self-pay | Admitting: Physical Medicine & Rehabilitation

## 2016-12-17 ENCOUNTER — Encounter: Payer: Medicare HMO | Attending: Physical Medicine & Rehabilitation | Admitting: Physical Medicine & Rehabilitation

## 2016-12-17 VITALS — BP 123/75 | HR 52

## 2016-12-17 DIAGNOSIS — R4189 Other symptoms and signs involving cognitive functions and awareness: Secondary | ICD-10-CM | POA: Insufficient documentation

## 2016-12-17 DIAGNOSIS — I10 Essential (primary) hypertension: Secondary | ICD-10-CM | POA: Insufficient documentation

## 2016-12-17 DIAGNOSIS — R531 Weakness: Secondary | ICD-10-CM | POA: Diagnosis not present

## 2016-12-17 DIAGNOSIS — G894 Chronic pain syndrome: Secondary | ICD-10-CM | POA: Diagnosis not present

## 2016-12-17 DIAGNOSIS — G479 Sleep disorder, unspecified: Secondary | ICD-10-CM

## 2016-12-17 DIAGNOSIS — E7800 Pure hypercholesterolemia, unspecified: Secondary | ICD-10-CM | POA: Insufficient documentation

## 2016-12-17 DIAGNOSIS — N529 Male erectile dysfunction, unspecified: Secondary | ICD-10-CM | POA: Insufficient documentation

## 2016-12-17 DIAGNOSIS — F17210 Nicotine dependence, cigarettes, uncomplicated: Secondary | ICD-10-CM | POA: Insufficient documentation

## 2016-12-17 DIAGNOSIS — Z8249 Family history of ischemic heart disease and other diseases of the circulatory system: Secondary | ICD-10-CM | POA: Insufficient documentation

## 2016-12-17 DIAGNOSIS — S3210XD Unspecified fracture of sacrum, subsequent encounter for fracture with routine healing: Secondary | ICD-10-CM | POA: Insufficient documentation

## 2016-12-17 DIAGNOSIS — Z79899 Other long term (current) drug therapy: Secondary | ICD-10-CM | POA: Insufficient documentation

## 2016-12-17 DIAGNOSIS — F329 Major depressive disorder, single episode, unspecified: Secondary | ICD-10-CM | POA: Insufficient documentation

## 2016-12-17 DIAGNOSIS — S32591D Other specified fracture of right pubis, subsequent encounter for fracture with routine healing: Secondary | ICD-10-CM | POA: Diagnosis not present

## 2016-12-17 DIAGNOSIS — F419 Anxiety disorder, unspecified: Secondary | ICD-10-CM | POA: Diagnosis not present

## 2016-12-17 DIAGNOSIS — S062X3S Diffuse traumatic brain injury with loss of consciousness of 1 hour to 5 hours 59 minutes, sequela: Secondary | ICD-10-CM

## 2016-12-17 DIAGNOSIS — H532 Diplopia: Secondary | ICD-10-CM | POA: Diagnosis not present

## 2016-12-17 DIAGNOSIS — R42 Dizziness and giddiness: Secondary | ICD-10-CM | POA: Insufficient documentation

## 2016-12-17 DIAGNOSIS — Z833 Family history of diabetes mellitus: Secondary | ICD-10-CM | POA: Diagnosis not present

## 2016-12-17 DIAGNOSIS — R530 Neoplastic (malignant) related fatigue: Secondary | ICD-10-CM

## 2016-12-17 DIAGNOSIS — S069X0D Unspecified intracranial injury without loss of consciousness, subsequent encounter: Secondary | ICD-10-CM | POA: Insufficient documentation

## 2016-12-17 MED ORDER — METHYLPHENIDATE HCL 10 MG PO TABS: 10.0000 mg | ORAL_TABLET | Freq: Two times a day (BID) | ORAL | 0 refills | Status: DC

## 2016-12-17 NOTE — Patient Instructions (Signed)
FIND SOME ACTIVITIES TO DO WHICH ARE FUN AND GET YOUR MIND

## 2016-12-17 NOTE — Progress Notes (Signed)
Subjective:    Patient ID: Tyler AskewDavid Torpey, male    DOB: 1950/10/19, 66 y.o.   MRN: [id number]  HPI   He is here in follow up of his TBI. He was not able to get the 10mg  ritalin LA because of cost. He has gone back to taking the IR alhtough he's forgetting the mid day doses at times  He is complaining of vision difficulties. He is having double vision at times.  He has had eye testing done locally which was apparently "normal".  He states that he can see better through one eye.  When he looks to both things are angulated in different ways.  He still has some issues with behavior at home.  He has become frustrated that he is unable to pursue use of the activities he wants was able to.  He loves to golf.  He was wants the "doer" and the one directing people not the one taking orders.  He wants to help with the move at home but his wife has been hesitant to let him help because she does not want him to fall.  He denies being depressed but does admit to being quite frustrated over his situation.  He is sleeping very well at this point.  Pain Inventory Average Pain 3 Pain Right Now 1 My pain is dull and aching  In the last 24 hours, has pain interfered with the following? General activity 1 Relation with others 1 Enjoyment of life 1 What TIME of day is your pain at its worst? night Sleep (in general) Fair  Pain is worse with: some activites Pain improves with: heat/ice Relief from Meds: .  Mobility walk without assistance ability to climb steps?  yes do you drive?  no  Function retired  Neuro/Psych numbness  Prior Studies Any changes since last visit?  no  Physicians involved in your care Any changes since last visit?  no   Family History  Problem Relation Age of Onset  . Diabetes Mother   . Heart attack Father    Social History   Socioeconomic History  . Marital status: Significant Other    Spouse name: Genice RougeBonnie Lovell  . Number of children: 2  . Years of  education: None  . Highest education level: None  Social Needs  . Financial resource strain: None  . Food insecurity - worry: None  . Food insecurity - inability: None  . Transportation needs - medical: None  . Transportation needs - non-medical: None  Occupational History  . Occupation: retired  Tobacco Use  . Smoking status: Current Every Day Smoker    Packs/day: 1.00    Years: 20.00    Pack years: 20.00    Types: Cigarettes  . Smokeless tobacco: Never Used  . Tobacco comment: patient unable to respond  Substance and Sexual Activity  . Alcohol use: No  . Drug use: No  . Sexual activity: None  Other Topics Concern  . None  Social History Narrative  . None   Past Surgical History:  Procedure Laterality Date  . NASAL SINUS SURGERY     Past Medical History:  Diagnosis Date  . Depression   . Heart murmur    previously  . High cholesterol   . HOH (hard of hearing)    mild in the right and moderate to severe in the left  . Hypertension   . Tinnitus of left ear    BP 123/75   Pulse (!) 52   SpO2 98%  Opioid Risk Score:   Fall Risk Score:  `1  Depression screen PHQ 2/9  No flowsheet data found.   Review of Systems  Constitutional: Positive for chills.  HENT: Negative.   Eyes: Negative.   Respiratory: Negative.   Cardiovascular: Negative.   Gastrointestinal: Negative.   Endocrine: Negative.   Genitourinary: Negative.   Musculoskeletal: Negative.   Skin: Negative.   Allergic/Immunologic: Negative.   Neurological: Negative.   Hematological: Negative.   Psychiatric/Behavioral: Negative.   All other systems reviewed and are negative.      Objective:   Physical Exam  Constitutional: He appears well-developed and well-nourished. NAD HENT: Normocephalic. Atraumatic.  Eyes: EOMI. No discharge.  Cardiovascular: bradycardia in 50's. Marland Kitchen Respiratory: CTA Bilaterally without wheezes or rales. Normal effort    GI: ND, BS+.  Musculoskeletal: No edema, no  right shoulder tenderness. Full ROM in all limbs, mild tenderness right shoulder with PROM Neurological: He is alert and oriented to person, place.  Strength 5/5. Normal balance Skin: Skin is warm and dry.  Psychiatric:  insight is improving. Less impulsive      Assessment & Plan:  Medical Problem List and Plan: 1. 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   -continue with HEP 2. Pain Management: tylenol.  4. Mood: continue celexa  -will make a referral to neuro-psych for coping skills 5. Neuropsych:  -ritalin continue at 10 mg bid  6. Hx of right displaced sacral and right pubic rami fracture: WBAT 8.  Diplopia: Made referral to Dr. Aura Camps for neuro ophthalmology assessment 9. Right clavicle fracture: ortho signed off 11. Sleep disturbance Trazodone  continue at 50mg  qhs     15 minutes of face to face patient care time were spent during this visit. All questions were encouraged and answered.Follow up in about 2 months

## 2017-01-15 ENCOUNTER — Encounter: Payer: Self-pay | Admitting: Physical Medicine & Rehabilitation

## 2017-02-18 ENCOUNTER — Ambulatory Visit: Payer: Medicare HMO | Admitting: Physical Medicine & Rehabilitation

## 2017-03-05 ENCOUNTER — Encounter: Payer: Medicare HMO | Admitting: Physical Medicine & Rehabilitation

## 2017-03-25 ENCOUNTER — Encounter: Payer: Medicare HMO | Attending: Physical Medicine & Rehabilitation | Admitting: Physical Medicine & Rehabilitation

## 2017-03-25 ENCOUNTER — Encounter: Payer: Self-pay | Admitting: Physical Medicine & Rehabilitation

## 2017-03-25 VITALS — BP 119/73 | HR 40

## 2017-03-25 DIAGNOSIS — F419 Anxiety disorder, unspecified: Secondary | ICD-10-CM | POA: Insufficient documentation

## 2017-03-25 DIAGNOSIS — Z8249 Family history of ischemic heart disease and other diseases of the circulatory system: Secondary | ICD-10-CM | POA: Insufficient documentation

## 2017-03-25 DIAGNOSIS — Z5181 Encounter for therapeutic drug level monitoring: Secondary | ICD-10-CM | POA: Diagnosis not present

## 2017-03-25 DIAGNOSIS — R42 Dizziness and giddiness: Secondary | ICD-10-CM | POA: Diagnosis not present

## 2017-03-25 DIAGNOSIS — Z79899 Other long term (current) drug therapy: Secondary | ICD-10-CM | POA: Insufficient documentation

## 2017-03-25 DIAGNOSIS — E7800 Pure hypercholesterolemia, unspecified: Secondary | ICD-10-CM | POA: Diagnosis not present

## 2017-03-25 DIAGNOSIS — Z833 Family history of diabetes mellitus: Secondary | ICD-10-CM | POA: Insufficient documentation

## 2017-03-25 DIAGNOSIS — R5383 Other fatigue: Secondary | ICD-10-CM | POA: Diagnosis not present

## 2017-03-25 DIAGNOSIS — G479 Sleep disorder, unspecified: Secondary | ICD-10-CM | POA: Insufficient documentation

## 2017-03-25 DIAGNOSIS — S3210XD Unspecified fracture of sacrum, subsequent encounter for fracture with routine healing: Secondary | ICD-10-CM | POA: Diagnosis not present

## 2017-03-25 DIAGNOSIS — S32591D Other specified fracture of right pubis, subsequent encounter for fracture with routine healing: Secondary | ICD-10-CM | POA: Diagnosis not present

## 2017-03-25 DIAGNOSIS — S062X3S Diffuse traumatic brain injury with loss of consciousness of 1 hour to 5 hours 59 minutes, sequela: Secondary | ICD-10-CM

## 2017-03-25 DIAGNOSIS — F17210 Nicotine dependence, cigarettes, uncomplicated: Secondary | ICD-10-CM | POA: Insufficient documentation

## 2017-03-25 DIAGNOSIS — S069X0D Unspecified intracranial injury without loss of consciousness, subsequent encounter: Secondary | ICD-10-CM | POA: Diagnosis present

## 2017-03-25 DIAGNOSIS — R531 Weakness: Secondary | ICD-10-CM | POA: Diagnosis not present

## 2017-03-25 DIAGNOSIS — I10 Essential (primary) hypertension: Secondary | ICD-10-CM | POA: Diagnosis not present

## 2017-03-25 DIAGNOSIS — F329 Major depressive disorder, single episode, unspecified: Secondary | ICD-10-CM | POA: Diagnosis not present

## 2017-03-25 DIAGNOSIS — N529 Male erectile dysfunction, unspecified: Secondary | ICD-10-CM | POA: Diagnosis not present

## 2017-03-25 DIAGNOSIS — R4189 Other symptoms and signs involving cognitive functions and awareness: Secondary | ICD-10-CM | POA: Insufficient documentation

## 2017-03-25 MED ORDER — METHYLPHENIDATE HCL 10 MG PO TABS: 10.0000 mg | ORAL_TABLET | Freq: Two times a day (BID) | ORAL | 0 refills | Status: DC

## 2017-03-25 NOTE — Patient Instructions (Addendum)
PLEASE FEEL FREE TO CALL OUR OFFICE WITH ANY PROBLEMS OR QUESTIONS [phone])  STOP METOPROLOL----  PLEASE GET LABS DONE WHICH I HAVE ORDERED.   IF STIL FEELING FATIGUED, RESUME RITALIN

## 2017-03-25 NOTE — Progress Notes (Signed)
Subjective:    Patient ID: Tyler Lane, male    DOB: March 07, 1950, 67 y.o.   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  In a lot of ways he feels that he is doing better.  He tries to stay active as he can during the day although he does feel that he has a lot of daytime fatigue.  He is not taking his Ritalin routinely in the afternoon although he does take the morning dose.  His sleep has been improved.  He denies depression.  Still has some pain in his neck as well as his right rib cage which can be an impediment to participating in physical activity.  He would like to get back to driving as we discussed in the past.  He has a pair of bifocal lenses which were made 2 months after his initial injury.  He is having problems with items up close when using these lenses and then in general with his depth perception when he has them on.   Pain Inventory Average Pain 0 Pain Right Now 0 My pain is na  In the last 24 hours, has pain interfered with the following? General activity 0 Relation with others 0 Enjoyment of life 0 What TIME of day is your pain at its worst? na Sleep (in general) Fair  Pain is worse with: na Pain improves with: na Relief from Meds: na  Mobility walk without assistance ability to climb steps?  yes do you drive?  no  Function retired  Neuro/Psych No problems in this area  Prior Studies Any changes since last visit?  no  Physicians involved in your care Any changes since last visit?  no   Family History  Problem Relation Age of Onset  . Diabetes Mother   . Heart attack Father    Social History   Socioeconomic History  . Marital status: Significant Other    Spouse name: Genice Rouge  . Number of children: 2  . Years of education: None  . Highest education level: None  Social Needs  . Financial resource strain: None  . Food insecurity - worry: None  . Food insecurity - inability: None  .  Transportation needs - medical: None  . Transportation needs - non-medical: None  Occupational History  . Occupation: retired  Tobacco Use  . Smoking status: Current Every Day Smoker    Packs/day: 1.00    Years: 20.00    Pack years: 20.00    Types: Cigarettes  . Smokeless tobacco: Never Used  . Tobacco comment: patient unable to respond  Substance and Sexual Activity  . Alcohol use: No  . Drug use: No  . Sexual activity: None  Other Topics Concern  . None  Social History Narrative  . None   Past Surgical History:  Procedure Laterality Date  . NASAL SINUS SURGERY     Past Medical History:  Diagnosis Date  . Depression   . Heart murmur    previously  . High cholesterol   . HOH (hard of hearing)    mild in the right and moderate to severe in the left  . Hypertension   . Tinnitus of left ear    BP 119/73   Pulse (!) 40 Comment: did not take afternoon medication  SpO2 95%   Opioid Risk Score:   Fall Risk Score:  `1  Depression screen PHQ 2/9  No flowsheet data found.   Review of  Systems  Constitutional: Negative.   HENT: Negative.   Eyes: Negative.   Respiratory: Negative.   Cardiovascular: Negative.   Gastrointestinal: Negative.   Endocrine: Negative.   Genitourinary: Negative.   Musculoskeletal: Negative.   Skin: Negative.   Allergic/Immunologic: Negative.   Neurological: Negative.   Hematological: Negative.   Psychiatric/Behavioral: Negative.   All other systems reviewed and are negative.      Objective:   Physical Exam  Constitutional: He appears well-developed and well-nourished. NAD HENT: Normocephalic. Atraumatic.  Eyes: EOMI. No discharge.  Cardiovascular: Heart rate in the 40s. Marland Kitchen. Respiratory:Clear to auscultation GI: ND, BS+.  Musculoskeletal: No edema, no right shoulder tenderness. Full ROM in all limbs, mild tenderness right shoulder with PROM Neurological: He is alert and oriented to person, place.  Strength 5/5.  Normal  sensation and balance. Skin: Skin is warm and dry.  Psychiatric:i improved insight and awareness.  Sometimes struggles with concentration and attention.  Tend to go around in circles at times during conversation.  Mood is quite pleasant and cooperative      Assessment & Plan:  Medical Problem List and Plan: 1. 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  He is still not allowed to drive 2. Pain Management: tylenol.  4. Mood: continue celexa            -will make a referral to neuro-psych for coping skills 5. Neuropsych:  -ritalin continue at 10 mg in the morning and 5-10 mg in the afternoon    -UDS today 6. Hx of right displaced sacral and right pubic rami fracture: WBAT 8. Visual changes: will seek optho for new presciption.  That might be all he needs to help his vision.  He may do better with just single vision lenses for driving when we ultimately get to that point 9. Right clavicle fracture: ortho signed off 10.  Fatigue:   -The certainly could be related to his bradycardia.  Advised him to stop his metoprolol which is at 12.5 mg daily.  If he has persistent bradycardia despite stopping then perhaps a cardiology consult is in order.   -Additionally will check thyroid studies as well as testosterone and cortisol levels.   -Continue Ritalin as above.     15minutes of face to face patient care time were spent during this visit. All questions were encouraged and answered.Follow up in about2 months

## 2017-03-27 LAB — T4: T4, Total: 5.9 ug/dL (ref 4.5–12.0)

## 2017-03-27 LAB — TESTOSTERONE, FREE: Testosterone, Free: 5.4 pg/mL — ABNORMAL LOW (ref 6.6–18.1)

## 2017-03-27 LAB — T3: T3, Total: 96 ng/dL (ref 71–180)

## 2017-03-27 LAB — T3, FREE: T3 FREE: 2.6 pg/mL (ref 2.0–4.4)

## 2017-03-27 LAB — CORTISOL-AM, BLOOD: CORTISOL - AM: 9.8 ug/dL (ref 6.2–19.4)

## 2017-03-27 LAB — TESTOSTERONE: TESTOSTERONE: 586 ng/dL (ref 264–916)

## 2017-03-27 LAB — TSH: TSH: 0.669 u[IU]/mL (ref 0.450–4.500)

## 2017-03-27 LAB — T4, FREE: Free T4: 0.82 ng/dL (ref 0.82–1.77)

## 2017-03-29 ENCOUNTER — Other Ambulatory Visit: Payer: Self-pay | Admitting: Physical Medicine & Rehabilitation

## 2017-03-30 LAB — TOXASSURE SELECT,+ANTIDEPR,UR

## 2017-04-01 ENCOUNTER — Telehealth: Payer: Self-pay | Admitting: *Deleted

## 2017-04-01 NOTE — Telephone Encounter (Signed)
Urine drug screen for this encounter is consistent for prescribed medication 

## 2017-04-02 ENCOUNTER — Other Ambulatory Visit: Payer: Self-pay | Admitting: *Deleted

## 2017-04-02 MED ORDER — TRAZODONE HCL 50 MG PO TABS: 50.0000 mg | ORAL_TABLET | Freq: Every day | ORAL | 4 refills | Status: DC

## 2017-04-04 ENCOUNTER — Encounter: Payer: Medicare HMO | Admitting: Psychology

## 2017-04-04 DIAGNOSIS — S062X3S Diffuse traumatic brain injury with loss of consciousness of 1 hour to 5 hours 59 minutes, sequela: Secondary | ICD-10-CM | POA: Diagnosis not present

## 2017-04-04 DIAGNOSIS — R4189 Other symptoms and signs involving cognitive functions and awareness: Secondary | ICD-10-CM

## 2017-04-04 DIAGNOSIS — S069X0D Unspecified intracranial injury without loss of consciousness, subsequent encounter: Secondary | ICD-10-CM | POA: Diagnosis not present

## 2017-04-04 DIAGNOSIS — R5383 Other fatigue: Secondary | ICD-10-CM

## 2017-04-14 ENCOUNTER — Encounter: Payer: Self-pay | Admitting: Psychology

## 2017-04-14 NOTE — Progress Notes (Signed)
Neuropsychological Consultation   Patient:   Tyler Lane   DOB:   1950/05/29  MR Number:  [id number]  [facility] [address], [place] [address] [id number] [place] [id number] Dept: [phone]           Date of Service:   04/05/2017  Start Time:   1 PM End Time:   2 PM  Provider/Observer:  Tyler Lane, Psy.D.       Clinical Neuropsychologist       Billing Code/Service: [phone] [id number] 4 Units  Chief Complaint:    Tyler Lane was referred for neuropsychological consultation due to difficulties adjusting to the residual neuropsychological and physical changes following a significant fall with traumatic brain injury.  The patient reports that he now he experiences significant visual disturbances and is unable to work.  The patient reports that he has tried numerous glasses but nothing repairs his visual difficulties.  The patient describes ongoing issues with word finding difficulties reduced information processing speed.  He does report that his speech is improving but he continues to have word finding and naming difficulties.  Reason for Service:  Tyler Lane was referred for neuropsychological by Dr. Riley Lane.  The patient had suffered a fall off a 10 foot ladder/and/or third story building pressure washing.  This occurred on 06/21/2016.  This was an unwitnessed fall.  The patient had a prior history of hypertension, depression, and hearing loss.  The patient was found down with a Glasgow Coma Scale of 8 at admission.  Head CT showed mild acute subarachnoid hemorrhage along with sylvian fissures bilaterally with hemorrhage occurring around the top left parietal lobe and top frontal lobe.  The patient also had a displaced fracture of his right mid clavicle, right second to sixth rib fractures with pulmonary contusion and mild displaced communicated fracture of the right pelvic ramus  and communicated transverse fracture of proximal right pubic ramus and compression deformity of the sixth and seventh thoracic vertebral body.  The patient had significant expressive language difficulties after the accident.  There were indications on the MRI of diffuse axonal injury along with the subarachnoid hemorrhage.  There were multiple microhemorrhages of the right frontal lobe, left occipital lobe and right thalamus.  Small hemorrhagic contusions of the left superior frontal gyrus and left dorsal midbrain.  The patient's first recall was of the second rehabilitation stay.  The patient denies recall of the accident, ED, or ICU.  He does not remember the initial comprehensive inpatient rehabilitation program.  Current Status:  The patient reports that he is having ongoing word finding difficulties, reduced information processing speed, and difficulties with vision disturbance.  The patient's wife reports that he cannot do things physically like he used to and he is unable to drive.  The patient is not working.  The patient even occult he working on the yard.  Patient will walk the dog during the day but he is not cooking and is very forgetful and has attentional issues.  The patient is very upset about his limitations following his accident/injury.  Behavioral Observation: Tyler AskewDavid Lane  presents as a [age] y.o.-year-old Right Caucasian Male who appeared his stated age. his dress was Appropriate and he was Well Groomed and his manners were Appropriate to the situation.  his participation was indicative of Appropriate behaviors.  There were physical disabilities noted.  he displayed an appropriate level of cooperation and motivation.     Interactions:  Active Appropriate and Redirectable  Attention:   abnormal and attention span appeared shorter than expected for age  Memory:   abnormal; remote memory intact, recent memory impaired  Visuo-spatial:  abnormal  Speech  (Volume):  low  Speech:   normal; the patient does describe in the work issues consistent with word finding difficulties and slowed information processing speed.  Thought Process:  Coherent and Relevant  Though Content:  WNL; not suicidal and not homicidal  Orientation:   person, place, time/date and situation  Judgment:   Good  Planning:   Good  Affect:    Irritable  Mood:    Dysphoric  Insight:   Good  Intelligence:   normal  Current Employment: Patient is not working and has not been able to work since his accident.  Medical History:   Past Medical History:  Diagnosis Date  . Depression   . Heart murmur    previously  . High cholesterol   . HOH (hard of hearing)    mild in the right and moderate to severe in the left  . Hypertension   . Tinnitus of left ear        Abuse/Trauma History: The patient suffered a severe brain injury and numerous physical injuries after falling off a 3 story building versus a 10 foot ladder.  This fall was unwitnessed is unclear what he actually fell off of  Psychiatric History:  The patient does have a history of depression.  Family Med/Psych History:  Family History  Problem Relation Age of Onset  . Diabetes Mother   . Heart attack Father     Risk of Suicide/Violence: low patient denies any current suicidal or homicidal ideation.  Impression/DX:  Tyler Lane was referred for neuropsychological by Dr. Riley Kill.  The patient had suffered a fall off a 10 foot ladder/and/or third story building pressure washing.  This occurred on 06/21/2016.  This was an unwitnessed fall.  The patient had a prior history of hypertension, depression, and hearing loss.  The patient was found down with a Glasgow Coma Scale of 8 at admission.  Head CT showed mild acute subarachnoid hemorrhage along with sylvian fissures bilaterally with hemorrhage occurring around the top left parietal lobe and top frontal lobe.  The patient also had a displaced fracture of  his right mid clavicle, right second to sixth rib fractures with pulmonary contusion and mild displaced communicated fracture of the right pelvic ramus and communicated transverse fracture of proximal right pubic ramus and compression deformity of the sixth and seventh thoracic vertebral body.  The patient had significant expressive language difficulties after the accident.  There were indications on the MRI of diffuse axonal injury along with the subarachnoid hemorrhage.  There were multiple microhemorrhages of the right frontal lobe, left occipital lobe and right thalamus.  Small hemorrhagic contusions of the left superior frontal gyrus and left dorsal midbrain.  The patient's first recall was of the second rehabilitation stay.  The patient denies recall of the accident, ED, or ICU.  He does not remember the initial comprehensive inpatient rehabilitation program.  The patient reports that he is having ongoing word finding difficulties, reduced information processing speed, and difficulties with vision disturbance.  The patient's wife reports that he cannot do things physically like he used to and he is unable to drive.  The patient is not working.  The patient even occult he working on the yard.  Patient will walk the dog during the day but he is not cooking  and is very forgetful and has attentional issues.  The patient is very upset about his limitations following his accident/injury.  Disposition/Plan:  We have set the patient up for individual therapeutic interventions to help work on coping and strategies around dealing with his residual cognitive deficits and limitations following his significant traumatic brain injury.  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

## 2017-05-27 ENCOUNTER — Encounter: Payer: Self-pay | Admitting: Physical Medicine & Rehabilitation

## 2017-05-27 ENCOUNTER — Encounter: Payer: Medicare HMO | Attending: Physical Medicine & Rehabilitation | Admitting: Physical Medicine & Rehabilitation

## 2017-05-27 ENCOUNTER — Other Ambulatory Visit: Payer: Self-pay

## 2017-05-27 VITALS — BP 115/80 | HR 74 | Ht 71.0 in | Wt 175.8 lb

## 2017-05-27 DIAGNOSIS — N529 Male erectile dysfunction, unspecified: Secondary | ICD-10-CM | POA: Diagnosis not present

## 2017-05-27 DIAGNOSIS — E7800 Pure hypercholesterolemia, unspecified: Secondary | ICD-10-CM | POA: Insufficient documentation

## 2017-05-27 DIAGNOSIS — S069X0D Unspecified intracranial injury without loss of consciousness, subsequent encounter: Secondary | ICD-10-CM | POA: Diagnosis present

## 2017-05-27 DIAGNOSIS — S3210XD Unspecified fracture of sacrum, subsequent encounter for fracture with routine healing: Secondary | ICD-10-CM | POA: Diagnosis not present

## 2017-05-27 DIAGNOSIS — Z5181 Encounter for therapeutic drug level monitoring: Secondary | ICD-10-CM | POA: Diagnosis not present

## 2017-05-27 DIAGNOSIS — R4189 Other symptoms and signs involving cognitive functions and awareness: Secondary | ICD-10-CM | POA: Insufficient documentation

## 2017-05-27 DIAGNOSIS — F329 Major depressive disorder, single episode, unspecified: Secondary | ICD-10-CM | POA: Insufficient documentation

## 2017-05-27 DIAGNOSIS — S062X3S Diffuse traumatic brain injury with loss of consciousness of 1 hour to 5 hours 59 minutes, sequela: Secondary | ICD-10-CM | POA: Diagnosis not present

## 2017-05-27 DIAGNOSIS — Z8249 Family history of ischemic heart disease and other diseases of the circulatory system: Secondary | ICD-10-CM | POA: Insufficient documentation

## 2017-05-27 DIAGNOSIS — R531 Weakness: Secondary | ICD-10-CM | POA: Diagnosis not present

## 2017-05-27 DIAGNOSIS — R42 Dizziness and giddiness: Secondary | ICD-10-CM | POA: Insufficient documentation

## 2017-05-27 DIAGNOSIS — G479 Sleep disorder, unspecified: Secondary | ICD-10-CM | POA: Insufficient documentation

## 2017-05-27 DIAGNOSIS — S42031S Displaced fracture of lateral end of right clavicle, sequela: Secondary | ICD-10-CM

## 2017-05-27 DIAGNOSIS — F17210 Nicotine dependence, cigarettes, uncomplicated: Secondary | ICD-10-CM | POA: Insufficient documentation

## 2017-05-27 DIAGNOSIS — Z833 Family history of diabetes mellitus: Secondary | ICD-10-CM | POA: Insufficient documentation

## 2017-05-27 DIAGNOSIS — S32591D Other specified fracture of right pubis, subsequent encounter for fracture with routine healing: Secondary | ICD-10-CM | POA: Insufficient documentation

## 2017-05-27 DIAGNOSIS — I10 Essential (primary) hypertension: Secondary | ICD-10-CM | POA: Diagnosis not present

## 2017-05-27 DIAGNOSIS — Z79899 Other long term (current) drug therapy: Secondary | ICD-10-CM | POA: Diagnosis not present

## 2017-05-27 DIAGNOSIS — F419 Anxiety disorder, unspecified: Secondary | ICD-10-CM | POA: Insufficient documentation

## 2017-05-27 MED ORDER — METHYLPHENIDATE HCL 10 MG PO TABS: 10.0000 mg | ORAL_TABLET | Freq: Two times a day (BID) | ORAL | 0 refills | Status: DC

## 2017-05-27 NOTE — Patient Instructions (Addendum)
RETURN TO DRIVING PLAN:  AFTER YOU HAVE THE CORRECT PRESCRIPTION GLASSES...........  WITH THE SUPERVISION OF A LICENSED DRIVER, PLEASE DRIVE IN AN EMPTY PARKING LOT FOR AT LEAST 2-3 TRIALS TO TEST REACTION TIME, VISION, USE OF EQUIPMENT IN CAR, ETC.  IF SUCCESSFUL WITH THE PARKING LOT DRIVING, PROCEED TO SUPERVISED DRIVING TRIALS IN YOUR NEIGHBORHOOD STREETS AT LOW TRAFFIC TIMES TO TEST OBSERVATION TO TRAFFIC SIGNALS, REACTION TIME, ETC. PLEASE ATTEMPT AT LEAST 2-3 TRIALS IN YOUR NEIGHBORHOOD.  IF NEIGHBORHOOD DRIVING IS SUCCESSFUL, YOU MAY PROCEED TO DRIVING IN BUSIER AREAS IN YOUR COMMUNITY WITH SUPERVISION OF A LICENSED DRIVER. PLEASE ATTEMPT AT LEAST 4-5 TRIALS.  IF COMMUNITY DRIVING IS SUCCESSFUL, YOU MAY PROCEED TO DRIVING ALONE, DURING THE DAY TIME, IN NON-PEAK TRAFFIC TIMES. YOU SHOULD DRIVE NO FURTHER THAN 15 MINUTES IN ONE DIRECTION. PLEASE DO NOT DRIVE IF YOU FEEL FATIGUED OR UNDER THE INFLUENCE OF MEDICATION.

## 2017-05-27 NOTE — Progress Notes (Signed)
Subjective:    Patient ID: Tyler AskewDavid Lane, male    DOB: 19-Sep-1950, 67 y.o.   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 He is staying active at home with his dogs. He walks them a couple miles a day.  He complains of left-sided back pain when he is up for longer periods of time.  I asked him which hand he holds least with him he states it is his left hand because he is afraid to use his right side due to pain in his prior trauma.  Apparently his primary doctor worked him up for his.   He has been home by himself without any issues.  He stated that on a couple occasions [place] [facility] [place].  He denies any problems.  He has had no new visual changes.  He still is wearing a bifocal lenses which does seem to be an exact "fit" for him from a prescription standpoint  Mood and behavior is generally been stable.  He is frustrated that he cannot do some of the things he wants to do but overall seems to be coping with them a bit better.  He has seen Dr. Kieth Brightlyodenbough for initial evaluation and felt that the visit was informative.  He was seen by his primary for chest pain and workup for this.  Workup was negative.  He is using diclofenac gel for his back and chest which seems to be helpful    Pain Inventory Average Pain 5 Pain Right Now 1 My pain is intermittent and sore  In the last 24 hours, has pain interfered with the following? General activity 5 Relation with others 8 Enjoyment of life n/a What TIME of day is your pain at its worst? morning Sleep (in general) Fair  Pain is worse with: walking Pain improves with: heat/ice Relief from Meds: 0  Mobility walk without assistance ability to climb steps?  yes do you drive?  no  Function retired  Neuro/Psych No problems in this area  Prior Studies Any changes since last visit?  no  Physicians involved in your care Dr.  Geographical information systems officerBulla-Gastroenterologist   Family History  Problem Relation Age of Onset  . Diabetes Mother   . Heart attack Father    Social History   Socioeconomic History  . Marital status: Significant Other    Spouse name: Genice RougeBonnie Lovell  . Number of children: 2  . Years of education: Not on file  . Highest education level: Not on file  Occupational History  . Occupation: retired  Engineer, productionocial Needs  . Financial resource strain: Not on file  . Food insecurity:    Worry: Not on file    Inability: Not on file  . Transportation needs:    Medical: Not on file    Non-medical: Not on file  Tobacco Use  . Smoking status: Current Every Day Smoker    Packs/day: 1.00    Years: 20.00    Pack years: 20.00    Types: Cigarettes  . Smokeless tobacco: Never Used  . Tobacco comment: patient unable to respond  Substance and Sexual Activity  . Alcohol use: No  . Drug use: No  . Sexual activity: Not on file  Lifestyle  . Physical activity:    Days per week: Not on file    Minutes per session: Not on file  . Stress: Not on file  Relationships  .  Social connections:    Talks on phone: Not on file    Gets together: Not on file    Attends religious service: Not on file    Active member of club or organization: Not on file    Attends meetings of clubs or organizations: Not on file    Relationship status: Not on file  Other Topics Concern  . Not on file  Social History Narrative  . Not on file   Past Surgical History:  Procedure Laterality Date  . NASAL SINUS SURGERY     Past Medical History:  Diagnosis Date  . Depression   . Heart murmur    previously  . High cholesterol   . HOH (hard of hearing)    mild in the right and moderate to severe in the left  . Hypertension   . Tinnitus of left ear    BP 115/80   Pulse 74   Ht 5\' 11"  (1.803 m) Comment: pt reported  Wt 175 lb 12.8 oz (79.7 kg)   SpO2 95%   BMI 24.52 kg/m   Opioid Risk Score:   Fall Risk Score:  `1  Depression screen PHQ  2/9  No flowsheet data found.  Review of Systems  Constitutional: Negative.   HENT: Negative.   Eyes: Negative.   Respiratory: Negative.   Cardiovascular: Negative.   Gastrointestinal: Negative.   Endocrine: Negative.   Genitourinary: Negative.   Musculoskeletal: Negative.   Skin: Negative.   Allergic/Immunologic: Negative.   Neurological: Negative.   Hematological: Negative.   Psychiatric/Behavioral: Negative.   All other systems reviewed and are negative.      Objective:   Physical Exam  General: No acute distress HEENT: EOMI, oral membranes moist Cards: reg rate  Chest: normal effort Abdomen: Soft, NT, ND Skin: dry, intact Extremities: no edema Musculoskeletal: No edema, no right shoulder tenderness. Full ROM in all limbs, mild tenderness right shoulder with PROM Neurological: He is alert and oriented to person, place. Improving insight and awareness Strength 5/5.  Normal sensation and balance. Skin: Skin is warm and dry.  Psychiatric:improved impulsivity.  Patient's pleasant   Assessment & Plan:  Medical Problem List and Plan: 1. 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    -AFTER new prescription for glasses he may begin driving trial as I outline for pt/wife. If there are problems we may need formal evaluation.  2. Pain Management: tylenol.  4. Mood:continuecelexa -continue neuro-psych for coping skills  5. Neuropsych:  -ritalin continue at 10 mg in the morning and 5-10 mg in the afternoon.  This prescription was filled today          -We will continue the controlled substance monitoring program, this consists of regular clinic visits, examinations, routine drug screening, pill counts as well as use of [place] Controlled Substance Reporting System. [place] today.   6. Hx of right displaced sacral and right pubic rami  fracture: WBAT 8.Visual changes: needs single vision lenses to drive.  9. Right clavicle fracture: ortho signed off 10.  Fatigue:          -HR improved  -symptoms generally improved. .          -Continue Ritalin as above.    of face to face patient care time were spent during this visit. All questions were encouraged and answered.Follow up in about3 months

## 2017-06-10 ENCOUNTER — Encounter (HOSPITAL_BASED_OUTPATIENT_CLINIC_OR_DEPARTMENT_OTHER): Payer: Medicare HMO | Admitting: Psychology

## 2017-06-10 DIAGNOSIS — S062X3S Diffuse traumatic brain injury with loss of consciousness of 1 hour to 5 hours 59 minutes, sequela: Secondary | ICD-10-CM

## 2017-06-10 DIAGNOSIS — S069X0D Unspecified intracranial injury without loss of consciousness, subsequent encounter: Secondary | ICD-10-CM | POA: Diagnosis not present

## 2017-06-11 ENCOUNTER — Encounter: Payer: Self-pay | Admitting: Psychology

## 2017-06-11 NOTE — Progress Notes (Signed)
Neuropsychological Consultation   Patient:   Tyler Lane   DOB:   1950/04/27  MR Number:  [id number]  Location:  [facility] PHYSICAL MEDICINE AND REHABILITATION [address] [place] [address] [id number] [place] [address] Dept: [phone]           Date of Service:   06/03/2017  Start Time:   1 PM End Time:   2 PM  Provider/Observer:  Arley Phenix, Psy.D.       Clinical Neuropsychologist       Billing Code/Service: [phone] 4 Units  Chief Complaint:    Tyler Lane was referred for neuropsychological consultation due to difficulties adjusting to the residual neuropsychological and physical changes following a significant fall with traumatic brain injury.  The patient reports that he now he experiences significant visual disturbances and is unable to work.  The patient reports that he has tried numerous glasses but nothing repairs his visual difficulties.  The patient describes ongoing issues with word finding difficulties reduced information processing speed.  He does report that his speech is improving but he continues to have word finding and naming difficulties.  Reason for Service:  Tyler Lane was referred for neuropsychological by Dr. Riley Kill.  The patient had suffered a fall off a 10 foot ladder/and/or third story building pressure washing.  This occurred on 06/21/2016.  This was an unwitnessed fall.  The patient had a prior history of hypertension, depression, and hearing loss.  The patient was found down with a Glasgow Coma Scale of 8 at admission.  Head CT showed mild acute subarachnoid hemorrhage along with sylvian fissures bilaterally with hemorrhage occurring around the top left parietal lobe and top frontal lobe.  The patient also had a displaced fracture of his right mid clavicle, right second to sixth rib fractures with pulmonary contusion and mild displaced communicated fracture of the right pelvic ramus  and communicated transverse fracture of proximal right pubic ramus and compression deformity of the sixth and seventh thoracic vertebral body.  The patient had significant expressive language difficulties after the accident.  There were indications on the MRI of diffuse axonal injury along with the subarachnoid hemorrhage.  There were multiple microhemorrhages of the right frontal lobe, left occipital lobe and right thalamus.  Small hemorrhagic contusions of the left superior frontal gyrus and left dorsal midbrain.  The patient's first recall was of the second rehabilitation stay.  The patient denies recall of the accident, ED, or ICU.  He does not remember the initial comprehensive inpatient rehabilitation program.  Current Status:  The patient reports that he is having ongoing word finding difficulties, reduced information processing speed, and difficulties with vision disturbance.  The patient's wife reports that he cannot do things physically like he used to and he is unable to drive.  The patient is not working.  The patient even occult he working on the yard.  Patient will walk the dog during the day but he is not cooking and is very forgetful and has attentional issues.  The patient is very upset about his limitations following his accident/injury.  Behavioral Observation: Tyler Lane  presents as a 67 y.o.-year-old Right Caucasian Male who appeared his stated age. his dress was Appropriate and he was Well Groomed and his manners were Appropriate to the situation.  his participation was indicative of Appropriate behaviors.  There were physical disabilities noted.  he displayed an appropriate level of cooperation and motivation.     Interactions:  Active Appropriate and Redirectable  Attention:   abnormal and attention span appeared shorter than expected for age  Memory:   abnormal; remote memory intact, recent memory impaired  Visuo-spatial:  abnormal  Speech  (Volume):  low  Speech:   normal; the patient does describe in the work issues consistent with word finding difficulties and slowed information processing speed.  Thought Process:  Coherent and Relevant  Though Content:  WNL; not suicidal and not homicidal  Orientation:   person, place, time/date and situation  Judgment:   Good  Planning:   Good  Affect:    Irritable  Mood:    Dysphoric  Insight:   Good  Intelligence:   normal  Current Employment: Patient is not working and has not been able to work since his accident.  Medical History:   Past Medical History:  Diagnosis Date  . Depression   . Heart murmur    previously  . High cholesterol   . HOH (hard of hearing)    mild in the right and moderate to severe in the left  . Hypertension   . Tinnitus of left ear        Abuse/Trauma History: The patient suffered a severe brain injury and numerous physical injuries after falling off a 3 story building versus a 10 foot ladder.  This fall was unwitnessed is unclear what he actually fell off of  Psychiatric History:  The patient does have a history of depression.  Family Med/Psych History:  Family History  Problem Relation Age of Onset  . Diabetes Mother   . Heart attack Father     Risk of Suicide/Violence: low patient denies any current suicidal or homicidal ideation.  Impression/DX:  Tyler Lane was referred for neuropsychological by Dr. Riley Kill.  The patient had suffered a fall off a 10 foot ladder/and/or third story building pressure washing.  This occurred on 06/21/2016.  This was an unwitnessed fall.  The patient had a prior history of hypertension, depression, and hearing loss.  The patient was found down with a Glasgow Coma Scale of 8 at admission.  Head CT showed mild acute subarachnoid hemorrhage along with sylvian fissures bilaterally with hemorrhage occurring around the top left parietal lobe and top frontal lobe.  The patient also had a displaced fracture of  his right mid clavicle, right second to sixth rib fractures with pulmonary contusion and mild displaced communicated fracture of the right pelvic ramus and communicated transverse fracture of proximal right pubic ramus and compression deformity of the sixth and seventh thoracic vertebral body.  The patient had significant expressive language difficulties after the accident.  There were indications on the MRI of diffuse axonal injury along with the subarachnoid hemorrhage.  There were multiple microhemorrhages of the right frontal lobe, left occipital lobe and right thalamus.  Small hemorrhagic contusions of the left superior frontal gyrus and left dorsal midbrain.  The patient's first recall was of the second rehabilitation stay.  The patient denies recall of the accident, ED, or ICU.  He does not remember the initial comprehensive inpatient rehabilitation program.  The patient reports that he is having ongoing word finding difficulties, reduced information processing speed, and difficulties with vision disturbance.  The patient's wife reports that he cannot do things physically like he used to and he is unable to drive.  The patient is not working.  The patient even occult he working on the yard.  Patient will walk the dog during the day but he is not cooking  and is very forgetful and has attentional issues.  The patient is very upset about his limitations following his accident/injury.  Disposition/Plan:  We have set the patient up for individual therapeutic interventions to help work on coping and strategies around dealing with his residual cognitive deficits and limitations following his significant traumatic brain injury.  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

## 2017-06-24 ENCOUNTER — Encounter: Payer: Medicare HMO | Attending: Physical Medicine & Rehabilitation | Admitting: Psychology

## 2017-06-24 DIAGNOSIS — S3210XD Unspecified fracture of sacrum, subsequent encounter for fracture with routine healing: Secondary | ICD-10-CM | POA: Insufficient documentation

## 2017-06-24 DIAGNOSIS — R4189 Other symptoms and signs involving cognitive functions and awareness: Secondary | ICD-10-CM | POA: Diagnosis not present

## 2017-06-24 DIAGNOSIS — R531 Weakness: Secondary | ICD-10-CM | POA: Diagnosis not present

## 2017-06-24 DIAGNOSIS — S32591D Other specified fracture of right pubis, subsequent encounter for fracture with routine healing: Secondary | ICD-10-CM | POA: Diagnosis not present

## 2017-06-24 DIAGNOSIS — S069X0D Unspecified intracranial injury without loss of consciousness, subsequent encounter: Secondary | ICD-10-CM | POA: Insufficient documentation

## 2017-06-24 DIAGNOSIS — Z833 Family history of diabetes mellitus: Secondary | ICD-10-CM | POA: Diagnosis not present

## 2017-06-24 DIAGNOSIS — F419 Anxiety disorder, unspecified: Secondary | ICD-10-CM | POA: Diagnosis not present

## 2017-06-24 DIAGNOSIS — Z79899 Other long term (current) drug therapy: Secondary | ICD-10-CM | POA: Insufficient documentation

## 2017-06-24 DIAGNOSIS — R42 Dizziness and giddiness: Secondary | ICD-10-CM | POA: Insufficient documentation

## 2017-06-24 DIAGNOSIS — S062X3S Diffuse traumatic brain injury with loss of consciousness of 1 hour to 5 hours 59 minutes, sequela: Secondary | ICD-10-CM | POA: Diagnosis not present

## 2017-06-24 DIAGNOSIS — E7800 Pure hypercholesterolemia, unspecified: Secondary | ICD-10-CM | POA: Insufficient documentation

## 2017-06-24 DIAGNOSIS — F17210 Nicotine dependence, cigarettes, uncomplicated: Secondary | ICD-10-CM | POA: Insufficient documentation

## 2017-06-24 DIAGNOSIS — F329 Major depressive disorder, single episode, unspecified: Secondary | ICD-10-CM | POA: Insufficient documentation

## 2017-06-24 DIAGNOSIS — N529 Male erectile dysfunction, unspecified: Secondary | ICD-10-CM | POA: Insufficient documentation

## 2017-06-24 DIAGNOSIS — Z8249 Family history of ischemic heart disease and other diseases of the circulatory system: Secondary | ICD-10-CM | POA: Diagnosis not present

## 2017-06-24 DIAGNOSIS — I10 Essential (primary) hypertension: Secondary | ICD-10-CM | POA: Diagnosis not present

## 2017-06-24 DIAGNOSIS — G479 Sleep disorder, unspecified: Secondary | ICD-10-CM | POA: Insufficient documentation

## 2017-06-28 ENCOUNTER — Encounter: Payer: Self-pay | Admitting: Psychology

## 2017-06-28 NOTE — Progress Notes (Signed)
Neuropsychological Consultation   Patient:   Tyler Lane   DOB:   Jun 14, 1950  MR Number:  [id number]  Location:  [facility] RE[facility]IL[facility]AT[facility]IC[facility] PHYSICAL MEDICINE AND REHABILITATION [address] [place] [address] [id number] [place] [address] Dept: [phone]           Date of Service:   06/24/2017  Start Time:   1 PM End Time:   2 PM  Provider/Observer:  Arley Phenix, Psy.D.       Clinical Neuropsychologist       Billing Code/Service: [id number] 4 Units  Chief Complaint:    Tyler Lane was referred for neuropsychological consultation due to difficulties adjusting to the residual neuropsychological and physical changes following a significant fall with traumatic brain injury.  The patient reports that he now he experiences significant visual disturbances and is unable to work.  The patient reports that he has tried numerous glasses but nothing repairs his visual difficulties.  The patient describes ongoing issues with word finding difficulties reduced information processing speed.  He does report that his speech is improving but he continues to have word finding and naming difficulties.  Reason for Service:  Tyler Lane was referred for neuropsychological by Dr. Riley Kill.  The patient had suffered a fall off a 10 foot ladder/and/or third story building pressure washing.  This occurred on 06/21/2016.  This was an unwitnessed fall.  The patient had a prior history of hypertension, depression, and hearing loss.  The patient was found down with a Glasgow Coma Scale of 8 at admission.  Head CT showed mild acute subarachnoid hemorrhage along with sylvian fissures bilaterally with hemorrhage occurring around the top left parietal lobe and top frontal lobe.  The patient also had a displaced fracture of his right mid clavicle, right second to sixth rib fractures with pulmonary contusion and mild displaced communicated fracture of the right pelvic ramus  and communicated transverse fracture of proximal right pubic ramus and compression deformity of the sixth and seventh thoracic vertebral body.  The patient had significant expressive language difficulties after the accident.  There were indications on the MRI of diffuse axonal injury along with the subarachnoid hemorrhage.  There were multiple microhemorrhages of the right frontal lobe, left occipital lobe and right thalamus.  Small hemorrhagic contusions of the left superior frontal gyrus and left dorsal midbrain.  The patient's first recall was of the second rehabilitation stay.  The patient denies recall of the accident, ED, or ICU.  He does not remember the initial comprehensive inpatient rehabilitation program.  Current Status:  The patient reports that he is having ongoing word finding difficulties, reduced information processing speed, and difficulties with vision disturbance.  The patient's wife reports that he cannot do things physically like he used to and he is unable to drive.  The patient is not working.  The patient even occult he working on the yard.  Patient will walk the dog during the day but he is not cooking and is very forgetful and has attentional issues.  The patient is very upset about his limitations following his accident/injury.  The patient returns today we continue to work on issues related to his residual neuropsychological deficits regarding diffuse traumatic brain injury.  The patient reports that he has been working on the therapeutic interventions we have been developing.  The patient reports that he is coping better and has been as active as he can.   Diagnosis:    Diffuse traumatic brain  injury w/LOC of 1 hour to 5 hours 59 minutes, sequela (HCC)         Electronically Signed   _______________________ Arley Phenix, Psy.D.

## 2017-07-09 ENCOUNTER — Ambulatory Visit: Payer: Medicare HMO | Admitting: Psychology

## 2017-07-15 ENCOUNTER — Telehealth: Payer: Self-pay | Admitting: *Deleted

## 2017-07-15 DIAGNOSIS — S062X3S Diffuse traumatic brain injury with loss of consciousness of 1 hour to 5 hours 59 minutes, sequela: Secondary | ICD-10-CM

## 2017-07-15 MED ORDER — METHYLPHENIDATE HCL 10 MG PO TABS: 10.0000 mg | ORAL_TABLET | Freq: Two times a day (BID) | ORAL | 0 refills | Status: DC

## 2017-07-15 NOTE — Telephone Encounter (Signed)
RF sent.

## 2017-07-15 NOTE — Telephone Encounter (Signed)
Patient needs refill on methylphenidate

## 2017-07-16 NOTE — Telephone Encounter (Signed)
Family notified

## 2017-07-22 ENCOUNTER — Encounter: Payer: Medicare HMO | Admitting: Psychology

## 2017-07-22 ENCOUNTER — Encounter: Payer: Medicare HMO | Attending: Physical Medicine & Rehabilitation | Admitting: Psychology

## 2017-07-22 DIAGNOSIS — E7800 Pure hypercholesterolemia, unspecified: Secondary | ICD-10-CM | POA: Insufficient documentation

## 2017-07-22 DIAGNOSIS — R4189 Other symptoms and signs involving cognitive functions and awareness: Secondary | ICD-10-CM | POA: Insufficient documentation

## 2017-07-22 DIAGNOSIS — R4701 Aphasia: Secondary | ICD-10-CM

## 2017-07-22 DIAGNOSIS — F17210 Nicotine dependence, cigarettes, uncomplicated: Secondary | ICD-10-CM | POA: Diagnosis not present

## 2017-07-22 DIAGNOSIS — H539 Unspecified visual disturbance: Secondary | ICD-10-CM | POA: Diagnosis not present

## 2017-07-22 DIAGNOSIS — S3210XD Unspecified fracture of sacrum, subsequent encounter for fracture with routine healing: Secondary | ICD-10-CM | POA: Insufficient documentation

## 2017-07-22 DIAGNOSIS — I10 Essential (primary) hypertension: Secondary | ICD-10-CM | POA: Diagnosis not present

## 2017-07-22 DIAGNOSIS — N529 Male erectile dysfunction, unspecified: Secondary | ICD-10-CM | POA: Diagnosis not present

## 2017-07-22 DIAGNOSIS — R531 Weakness: Secondary | ICD-10-CM | POA: Diagnosis not present

## 2017-07-22 DIAGNOSIS — R42 Dizziness and giddiness: Secondary | ICD-10-CM | POA: Diagnosis not present

## 2017-07-22 DIAGNOSIS — Z833 Family history of diabetes mellitus: Secondary | ICD-10-CM | POA: Diagnosis not present

## 2017-07-22 DIAGNOSIS — S062X3S Diffuse traumatic brain injury with loss of consciousness of 1 hour to 5 hours 59 minutes, sequela: Secondary | ICD-10-CM

## 2017-07-22 DIAGNOSIS — S069X0D Unspecified intracranial injury without loss of consciousness, subsequent encounter: Secondary | ICD-10-CM | POA: Diagnosis not present

## 2017-07-22 DIAGNOSIS — G3184 Mild cognitive impairment, so stated: Secondary | ICD-10-CM | POA: Diagnosis not present

## 2017-07-22 DIAGNOSIS — F329 Major depressive disorder, single episode, unspecified: Secondary | ICD-10-CM | POA: Diagnosis not present

## 2017-07-22 DIAGNOSIS — F419 Anxiety disorder, unspecified: Secondary | ICD-10-CM | POA: Insufficient documentation

## 2017-07-22 DIAGNOSIS — G479 Sleep disorder, unspecified: Secondary | ICD-10-CM | POA: Insufficient documentation

## 2017-07-22 DIAGNOSIS — Z8249 Family history of ischemic heart disease and other diseases of the circulatory system: Secondary | ICD-10-CM | POA: Diagnosis not present

## 2017-07-22 DIAGNOSIS — S32591D Other specified fracture of right pubis, subsequent encounter for fracture with routine healing: Secondary | ICD-10-CM | POA: Diagnosis not present

## 2017-07-22 DIAGNOSIS — Z79899 Other long term (current) drug therapy: Secondary | ICD-10-CM | POA: Insufficient documentation

## 2017-07-23 ENCOUNTER — Encounter: Payer: Self-pay | Admitting: Psychology

## 2017-07-23 NOTE — Progress Notes (Signed)
Neuropsychological Consultation   Patient:   Tyler Lane   DOB:   09/22/1950  MR Number:  [id number]  Location:  [facility] [address], [place] [address] [id number] [place] [id number] Dept: [phone]           Date of Service:   07/22/2017  Start Time:   4 PM End Time:   5 PM  Provider/Observer:  Arley PhenixJohn Cady Hafen, Psy.D.       Clinical Neuropsychologist       Billing Code/Service: [id number] 4 Units  Chief Complaint:    Tyler Lane was referred for neuropsychological consultation due to difficulties adjusting to the residual neuropsychological and physical changes following a significant fall with traumatic brain injury.  The patient reports that he now he experiences significant visual disturbances and is unable to work.  The patient reports that he has tried numerous glasses but nothing repairs his visual difficulties.  The patient describes ongoing issues with word finding difficulties reduced information processing speed.  He does report that his speech is improving but he continues to have word finding and naming difficulties.  The above chief complaint has been reviewed and remains but relevant appropriate.  Reason for Service:  Tyler Lane was referred for neuropsychological by Dr. Riley KillSwartz.  The patient had suffered a fall off a 10 foot ladder/and/or third story building pressure washing.  This occurred on 06/21/2016.  This was an unwitnessed fall.  The patient had a prior history of hypertension, depression, and hearing loss.  The patient was found down with a Glasgow Coma Scale of 8 at admission.  Head CT showed mild acute subarachnoid hemorrhage along with sylvian fissures bilaterally with hemorrhage occurring around the top left parietal lobe and top frontal lobe.  The patient also had a displaced fracture of his right mid clavicle, right second to sixth rib fractures with  pulmonary contusion and mild displaced communicated fracture of the right pelvic ramus and communicated transverse fracture of proximal right pubic ramus and compression deformity of the sixth and seventh thoracic vertebral body.  The patient had significant expressive language difficulties after the accident.  There were indications on the MRI of diffuse axonal injury along with the subarachnoid hemorrhage.  There were multiple microhemorrhages of the right frontal lobe, left occipital lobe and right thalamus.  Small hemorrhagic contusions of the left superior frontal gyrus and left dorsal midbrain.  The patient's first recall was of the second rehabilitation stay.  The patient denies recall of the accident, ED, or ICU.  He does not remember the initial comprehensive inpatient rehabilitation program.  The above reason for service remains current and appropriate and it has been reviewed.  Current Status:  The patient reports that he is having ongoing word finding difficulties, reduced information processing speed, and difficulties with vision disturbance.  The patient's wife reports that he cannot do things physically like he used to and he is unable to drive.  The patient is not working.  The patient even occult he working on the yard.  Patient will walk the dog during the day but he is not cooking and is very forgetful and has attentional issues.  The patient is very upset about his limitations following his accident/injury.  The patient returns today and we have continue to work on issues related to his residual neuropsychological deficits related to diffuse axonal injury.  The patient reports that he is continued to work on therapeutic interventions  we have developed.  The patient reports that he is progressing well with the treatment for his hepatitis C as well.  The patient does acknowledge being fatigued.  The patient reports that he is trying to do more more things outside of his home and has begun  driving very short distances.    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

## 2017-08-21 ENCOUNTER — Telehealth: Payer: Self-pay

## 2017-08-21 DIAGNOSIS — S062X3S Diffuse traumatic brain injury with loss of consciousness of 1 hour to 5 hours 59 minutes, sequela: Secondary | ICD-10-CM

## 2017-08-21 NOTE — Telephone Encounter (Signed)
Request refill on Methylphenidate 10 mg, last filled 07/15/17, next appt 08/26/17. [facility] in [place]

## 2017-08-22 MED ORDER — METHYLPHENIDATE HCL 10 MG PO TABS: 10.0000 mg | ORAL_TABLET | Freq: Two times a day (BID) | ORAL | 0 refills | Status: DC

## 2017-08-22 NOTE — Telephone Encounter (Signed)
Done

## 2017-08-26 ENCOUNTER — Encounter: Payer: Self-pay | Admitting: Physical Medicine & Rehabilitation

## 2017-08-26 ENCOUNTER — Encounter: Payer: Medicare HMO | Attending: Physical Medicine & Rehabilitation | Admitting: Physical Medicine & Rehabilitation

## 2017-08-26 VITALS — BP 131/93 | HR 91 | Ht 73.0 in | Wt 174.0 lb

## 2017-08-26 DIAGNOSIS — S3210XD Unspecified fracture of sacrum, subsequent encounter for fracture with routine healing: Secondary | ICD-10-CM | POA: Diagnosis not present

## 2017-08-26 DIAGNOSIS — S069X0D Unspecified intracranial injury without loss of consciousness, subsequent encounter: Secondary | ICD-10-CM | POA: Diagnosis not present

## 2017-08-26 DIAGNOSIS — Z8249 Family history of ischemic heart disease and other diseases of the circulatory system: Secondary | ICD-10-CM | POA: Insufficient documentation

## 2017-08-26 DIAGNOSIS — R5383 Other fatigue: Secondary | ICD-10-CM | POA: Diagnosis not present

## 2017-08-26 DIAGNOSIS — Z79899 Other long term (current) drug therapy: Secondary | ICD-10-CM | POA: Diagnosis not present

## 2017-08-26 DIAGNOSIS — N529 Male erectile dysfunction, unspecified: Secondary | ICD-10-CM | POA: Insufficient documentation

## 2017-08-26 DIAGNOSIS — S32591D Other specified fracture of right pubis, subsequent encounter for fracture with routine healing: Secondary | ICD-10-CM | POA: Diagnosis not present

## 2017-08-26 DIAGNOSIS — F5101 Primary insomnia: Secondary | ICD-10-CM | POA: Diagnosis not present

## 2017-08-26 DIAGNOSIS — E7800 Pure hypercholesterolemia, unspecified: Secondary | ICD-10-CM | POA: Insufficient documentation

## 2017-08-26 DIAGNOSIS — I10 Essential (primary) hypertension: Secondary | ICD-10-CM | POA: Diagnosis not present

## 2017-08-26 DIAGNOSIS — F419 Anxiety disorder, unspecified: Secondary | ICD-10-CM | POA: Diagnosis not present

## 2017-08-26 DIAGNOSIS — F329 Major depressive disorder, single episode, unspecified: Secondary | ICD-10-CM | POA: Diagnosis not present

## 2017-08-26 DIAGNOSIS — S062X3S Diffuse traumatic brain injury with loss of consciousness of 1 hour to 5 hours 59 minutes, sequela: Secondary | ICD-10-CM | POA: Diagnosis not present

## 2017-08-26 DIAGNOSIS — F17210 Nicotine dependence, cigarettes, uncomplicated: Secondary | ICD-10-CM | POA: Insufficient documentation

## 2017-08-26 DIAGNOSIS — Z833 Family history of diabetes mellitus: Secondary | ICD-10-CM | POA: Insufficient documentation

## 2017-08-26 DIAGNOSIS — G479 Sleep disorder, unspecified: Secondary | ICD-10-CM | POA: Insufficient documentation

## 2017-08-26 DIAGNOSIS — R42 Dizziness and giddiness: Secondary | ICD-10-CM | POA: Diagnosis not present

## 2017-08-26 DIAGNOSIS — R531 Weakness: Secondary | ICD-10-CM | POA: Insufficient documentation

## 2017-08-26 DIAGNOSIS — R4189 Other symptoms and signs involving cognitive functions and awareness: Secondary | ICD-10-CM | POA: Insufficient documentation

## 2017-08-26 MED ORDER — METHYLPHENIDATE HCL 10 MG PO TABS: 10.0000 mg | ORAL_TABLET | Freq: Two times a day (BID) | ORAL | 0 refills | Status: DC

## 2017-08-26 MED ORDER — TRAZODONE HCL 50 MG PO TABS: 50.0000 mg | ORAL_TABLET | Freq: Every day | ORAL | 6 refills | Status: DC

## 2017-08-26 NOTE — Patient Instructions (Signed)
PLEASE FEEL FREE TO CALL OUR OFFICE WITH ANY PROBLEMS OR QUESTIONS ([phone])      

## 2017-08-26 NOTE — Progress Notes (Signed)
Subjective:    Patient ID: Tyler AskewDavid Lane, male    DOB: Nov 03, 1950, 67 y.o.   MRN: [id number]  HPI   Mr. Tyler KerbsConverse is here in follow up of his TBI and associated deficits. He has been doing fairly well.  He continues to improve from a cognitive standpoint. He does note that he still can fatigue easily. He ran out of trazodone and noted that he didn't sleep as well and would like a refill for the rx.   He has noticed occasional difficulties clearing secretions from his throat when he has sinus drainage or when he eats cereal. He hadn't mentioned them before but has had the symptoms for a while. He wanted to know what to expect in the future. He doesn't have any problems drinking fluids.   He likes to walk his dogs and hasn't reported any problems with balance.   He started driving with his wife but realized his single vision glasses had too much glare on them which made it difficult in the sun. '  Overall his mood has been positive. He and his wife are in the process of a move.     Pain Inventory Average Pain 0 Pain Right Now 0 My pain is na  In the last 24 hours, has pain interfered with the following? General activity 0 Relation with others 0 Enjoyment of life 0 What TIME of day is your pain at its worst? na Sleep (in general) Fair  Pain is worse with: na Pain improves with: na Relief from Meds: na  Mobility walk without assistance ability to climb steps?  yes do you drive?  yes  Function retired  Neuro/Psych No problems in this area  Prior Studies Any changes since last visit?  no  Physicians involved in your care Any changes since last visit?  no   Family History  Problem Relation Age of Onset  . Diabetes Mother   . Heart attack Father    Social History   Socioeconomic History  . Marital status: Significant Other    Spouse name: Genice RougeBonnie Lovell  . Number of children: 2  . Years of education: Not on file  . Highest education level: Not on file    Occupational History  . Occupation: retired  Engineer, productionocial Needs  . Financial resource strain: Not on file  . Food insecurity:    Worry: Not on file    Inability: Not on file  . Transportation needs:    Medical: Not on file    Non-medical: Not on file  Tobacco Use  . Smoking status: Former Smoker    Packs/day: 0.00    Years: 20.00    Pack years: 0.00    Types: Cigarettes  . Smokeless tobacco: Never Used  . Tobacco comment: patient unable to respond  Substance and Sexual Activity  . Alcohol use: No  . Drug use: No  . Sexual activity: Not on file  Lifestyle  . Physical activity:    Days per week: Not on file    Minutes per session: Not on file  . Stress: Not on file  Relationships  . Social connections:    Talks on phone: Not on file    Gets together: Not on file    Attends religious service: Not on file    Active member of club or organization: Not on file    Attends meetings of clubs or organizations: Not on file    Relationship status: Not on file  Other Topics Concern  .  Not on file  Social History Narrative  . Not on file   Past Surgical History:  Procedure Laterality Date  . NASAL SINUS SURGERY     Past Medical History:  Diagnosis Date  . Depression   . Heart murmur    previously  . High cholesterol   . HOH (hard of hearing)    mild in the right and moderate to severe in the left  . Hypertension   . Tinnitus of left ear    BP (!) 131/93   Pulse 91   Ht 6\' 1"  (1.854 m)   Wt 174 lb (78.9 kg)   SpO2 93%   BMI 22.96 kg/m   Opioid Risk Score:   Fall Risk Score:  `1  Depression screen PHQ 2/9  Depression screen PHQ 2/9 05/27/2017  Decreased Interest 0  Down, Depressed, Hopeless 2  PHQ - 2 Score 2  Altered sleeping 0  Tired, decreased energy 0  Change in appetite 0  Feeling bad or failure about yourself  0  Trouble concentrating 0  Moving slowly or fidgety/restless 0  Suicidal thoughts 0  PHQ-9 Score 2  Difficult doing work/chores Somewhat  difficult     Review of Systems  Constitutional: Negative.   HENT: Negative.   Eyes: Negative.   Respiratory: Negative.   Cardiovascular: Negative.   Gastrointestinal: Negative.   Endocrine: Negative.   Genitourinary: Negative.   Musculoskeletal: Negative.   Skin: Negative.   Allergic/Immunologic: Negative.   Neurological: Negative.   Hematological: Negative.   Psychiatric/Behavioral: Negative.   All other systems reviewed and are negative.      Objective:   Physical Exam General: No acute distress HEENT: EOMI, oral membranes moist, voice sl hoarse but quite clear Cards: reg rate  Chest: normal effort Abdomen: Soft, NT, ND Skin: dry, intact Extremities: no edema Musculoskeletal: No edema, no right shoulder tenderness. Full ROM in all limbs, mild tenderness right shoulder with PROM Neurological: He is alert and oriented to person, place. Improved insight and awareness Strength 5/5.Normal sensation and balance. Skin: Skin is warm and dry.  Psychiatric:pleasant and not impulsive   Assessment & Plan:  Medical Problem List and Plan: 1. 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         -Continue driving with glasses. May need tinted covers to reduce glare.  2. Pain Management: tylenol.  4. Mood:continuecelexa -improved, wife is supportive  5. Neuropsych:  -ritalin 10mg  am and 5-10mg  at noon.  -We will continue the controlled substance monitoring program, this consists of regular clinic visits, examinations, routine drug screening, pill counts as well as use of [place] Controlled Substance Reporting System. [place] was [place] today.    6. Hx of right displaced sacral and right pubic rami fracture: WBAT 7. ?Dysphagia/dysphonia: likely related to intubation, post-TBI pharyngeal weakness.    -it's very mild. Can refer to ENT if he  fails to see more improvement 8.Visual changes: needs single vision lenses to drive.  9. Right clavicle fracture: ortho signed off 10. Fatigue: ritalin as above  -trazodone for sleep.reiterated the importance of regular sleep  of face to face patient care time were spent during this visit. All questions were encouraged and answered.Follow up in about4 months

## 2017-12-06 ENCOUNTER — Telehealth: Payer: Self-pay

## 2017-12-06 DIAGNOSIS — S062X3S Diffuse traumatic brain injury with loss of consciousness of 1 hour to 5 hours 59 minutes, sequela: Secondary | ICD-10-CM

## 2017-12-06 MED ORDER — METHYLPHENIDATE HCL 10 MG PO TABS: 10.0000 mg | ORAL_TABLET | Freq: Two times a day (BID) | ORAL | 0 refills | Status: DC

## 2017-12-06 NOTE — Telephone Encounter (Signed)
Pt requesting refill on Methylphenidate, last filled 10-31-17 according to PMP aware. Next appt 12-30-17. [facility]

## 2017-12-06 NOTE — Telephone Encounter (Signed)
done

## 2017-12-30 ENCOUNTER — Encounter: Payer: Medicare HMO | Attending: Physical Medicine & Rehabilitation | Admitting: Physical Medicine & Rehabilitation

## 2017-12-30 ENCOUNTER — Encounter: Payer: Self-pay | Admitting: Physical Medicine & Rehabilitation

## 2017-12-30 VITALS — BP 121/81 | HR 63 | Ht 71.0 in | Wt 182.0 lb

## 2017-12-30 DIAGNOSIS — F329 Major depressive disorder, single episode, unspecified: Secondary | ICD-10-CM | POA: Insufficient documentation

## 2017-12-30 DIAGNOSIS — S3210XD Unspecified fracture of sacrum, subsequent encounter for fracture with routine healing: Secondary | ICD-10-CM | POA: Diagnosis not present

## 2017-12-30 DIAGNOSIS — Z87891 Personal history of nicotine dependence: Secondary | ICD-10-CM | POA: Insufficient documentation

## 2017-12-30 DIAGNOSIS — Z5181 Encounter for therapeutic drug level monitoring: Secondary | ICD-10-CM | POA: Diagnosis not present

## 2017-12-30 DIAGNOSIS — Z8249 Family history of ischemic heart disease and other diseases of the circulatory system: Secondary | ICD-10-CM | POA: Insufficient documentation

## 2017-12-30 DIAGNOSIS — R490 Dysphonia: Secondary | ICD-10-CM | POA: Insufficient documentation

## 2017-12-30 DIAGNOSIS — S069X0D Unspecified intracranial injury without loss of consciousness, subsequent encounter: Secondary | ICD-10-CM | POA: Diagnosis not present

## 2017-12-30 DIAGNOSIS — S32591D Other specified fracture of right pubis, subsequent encounter for fracture with routine healing: Secondary | ICD-10-CM | POA: Diagnosis not present

## 2017-12-30 DIAGNOSIS — R531 Weakness: Secondary | ICD-10-CM | POA: Diagnosis not present

## 2017-12-30 DIAGNOSIS — I10 Essential (primary) hypertension: Secondary | ICD-10-CM | POA: Insufficient documentation

## 2017-12-30 DIAGNOSIS — Z833 Family history of diabetes mellitus: Secondary | ICD-10-CM | POA: Diagnosis not present

## 2017-12-30 DIAGNOSIS — H9193 Unspecified hearing loss, bilateral: Secondary | ICD-10-CM | POA: Diagnosis not present

## 2017-12-30 DIAGNOSIS — M542 Cervicalgia: Secondary | ICD-10-CM | POA: Diagnosis not present

## 2017-12-30 DIAGNOSIS — E7800 Pure hypercholesterolemia, unspecified: Secondary | ICD-10-CM | POA: Diagnosis not present

## 2017-12-30 DIAGNOSIS — X58XXXD Exposure to other specified factors, subsequent encounter: Secondary | ICD-10-CM | POA: Insufficient documentation

## 2017-12-30 DIAGNOSIS — R4189 Other symptoms and signs involving cognitive functions and awareness: Secondary | ICD-10-CM | POA: Diagnosis not present

## 2017-12-30 DIAGNOSIS — R5383 Other fatigue: Secondary | ICD-10-CM | POA: Insufficient documentation

## 2017-12-30 DIAGNOSIS — H532 Diplopia: Secondary | ICD-10-CM | POA: Insufficient documentation

## 2017-12-30 DIAGNOSIS — S062X3S Diffuse traumatic brain injury with loss of consciousness of 1 hour to 5 hours 59 minutes, sequela: Secondary | ICD-10-CM | POA: Diagnosis not present

## 2017-12-30 MED ORDER — METHYLPHENIDATE HCL 10 MG PO TABS: 10.0000 mg | ORAL_TABLET | Freq: Two times a day (BID) | ORAL | 0 refills | Status: DC

## 2017-12-30 NOTE — Progress Notes (Signed)
Subjective:    Patient ID: Tyler Lane, male    DOB: 1950/04/06, 67 y.o.   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 Things have been fairly stable. He has had more pain with the cold weather coming on.  He has been struggling with staying on task.  He does walk the dog daily.  Wife stated that she gave him a white board of tasks to do during the week while she was gone at work, however he has not been able to keep up with this.  They seem to abandon it pretty quickly thereafter.  He is is safe at home on his own.  He is eating well.  Sleep is reasonable.  He has questions about his vision as he noticed ongoing double vision.  He sees 2 objects in a vertical plane.  He has seen an optometrist in the last year and has several sets of glasses, however none of them seems to provide him the complete vision he seeks.  He has bifocal lenses on today.  He has a single vision lens for driving although he is not driving given the diplopia.  Mood has been fairly reasonable.  He does get frustrated easily with activities at home and the fact that he is unable to complete them due to his cognitive deficits.  He remains on Ritalin twice daily as prescribed.  Pain Inventory Average Pain 2 Pain Right Now 2 My pain is very little pain  In the last 24 hours, has pain interfered with the following? General activity 0 Relation with others 0 Enjoyment of life 0 What TIME of day is your pain at its worst? very little pain Sleep (in general) Good  Pain is worse with: very little pain Pain improves with: very little pain Relief from Meds: very little pain  Mobility walk without assistance  Function Do you have any goals in this area?  no  Neuro/Psych No problems in this area  Prior Studies Any changes since last visit?  no  Physicians involved in your care Any changes since last visit?  no   Family History  Problem Relation Age of Onset  . Diabetes Mother   .  Heart attack Father    Social History   Socioeconomic History  . Marital status: Significant Other    Spouse name: Genice RougeBonnie Lovell  . Number of children: 2  . Years of education: Not on file  . Highest education level: Not on file  Occupational History  . Occupation: retired  Engineer, productionocial Needs  . Financial resource strain: Not on file  . Food insecurity:    Worry: Not on file    Inability: Not on file  . Transportation needs:    Medical: Not on file    Non-medical: Not on file  Tobacco Use  . Smoking status: Former Smoker    Packs/day: 0.00    Years: 20.00    Pack years: 0.00    Types: Cigarettes  . Smokeless tobacco: Never Used  . Tobacco comment: patient unable to respond  Substance and Sexual Activity  . Alcohol use: No  . Drug use: No  . Sexual activity: Not on file  Lifestyle  . Physical activity:    Days per week: Not on file    Minutes per session: Not on file  . Stress: Not on file  Relationships  . Social connections:    Talks on phone: Not on file  Gets together: Not on file    Attends religious service: Not on file    Active member of club or organization: Not on file    Attends meetings of clubs or organizations: Not on file    Relationship status: Not on file  Other Topics Concern  . Not on file  Social History Narrative  . Not on file   Past Surgical History:  Procedure Laterality Date  . NASAL SINUS SURGERY     Past Medical History:  Diagnosis Date  . Depression   . Heart murmur    previously  . High cholesterol   . HOH (hard of hearing)    mild in the right and moderate to severe in the left  . Hypertension   . Tinnitus of left ear    BP 121/81   Pulse 63   Ht 5\' 11"  (1.803 m)   Wt 182 lb (82.6 kg)   SpO2 94%   BMI 25.38 kg/m   Opioid Risk Score:   Fall Risk Score:  `1  Depression screen PHQ 2/9  Depression screen PHQ 2/9 05/27/2017  Decreased Interest 0  Down, Depressed, Hopeless 2  PHQ - 2 Score 2  Altered sleeping 0    Tired, decreased energy 0  Change in appetite 0  Feeling bad or failure about yourself  0  Trouble concentrating 0  Moving slowly or fidgety/restless 0  Suicidal thoughts 0  PHQ-9 Score 2  Difficult doing work/chores Somewhat difficult    Review of Systems  Constitutional: Negative.   HENT: Negative.   Eyes: Negative.   Respiratory: Negative.   Cardiovascular: Negative.   Gastrointestinal: Negative.   Endocrine: Negative.   Genitourinary: Negative.   Musculoskeletal: Positive for neck pain and neck stiffness.  Skin: Negative.   Allergic/Immunologic: Negative.   Neurological: Positive for headaches.  Hematological: Negative.   Psychiatric/Behavioral: Negative.   All other systems reviewed and are negative.      Objective:   Physical Exam  General: No acute distress HEENT: EOMI, oral membranes moist Cards: reg rate  Chest: normal effort Abdomen: Soft, NT, ND Skin: dry, intact Extremities: no edema  Musculoskeletal: No edema, no right shoulder tenderness. Full ROM in all limbs, mild tenderness right shoulder with PROM Neurological: He is alert and oriented to person, place. much better insight and awareness. Does struggle with concentration at times.  Strength 5/5.Normal sensation and balance. Skin: Skin is warm and dry.  Psychiatric:pleasant and not impulsive   Assessment & Plan:  Medical Problem List and Plan: 1. 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 -Reiterated with him and his wife that he needs to work on routine and compensatory strategies.  If he expects to continue approaching cognitive tasks and things he used to do before and ways he use prior to the injury, that he is going to be very frustrated.  Discussed with them regarding the use of a regular calendar and other cues to help keep him on  task.. 2. Pain Management: tylenol.  4. Mood:continuecelexa -improved, wife is supportive  as always 5. Neuropsych:  -ritalin 10mg  am and 5-10mg  at noon. RF today -We will continue the controlled substance monitoring program, this consists of regular clinic visits, examinations, routine drug screening, pill counts as well as use of [place] Controlled Substance Reporting System. [place] was reviewed today.   6. Hx of right displaced  sacral and right pubic rami fracture: WBAT 7. ?Dysphagia/dysphonia: likely related to intubation, -seems improved 8.Visual changes:needs single vision lenses to drive.  -Ongoing diplopia.  Made referral to neuro-ophthalmology, Dr. Aura Camps for assessment. 9. Right clavicle fracture: ortho signed off 10. Fatigue: ritalin as above       -trazodone for sleep.  -continue with improved sleep hygiene. Needs to keep schedule.   of face to face patient care time were spent during this visit. All questions were encouraged and answered.Follow up in about4 months

## 2017-12-30 NOTE — Addendum Note (Signed)
Addended by: Faith RogueSWARTZ, Salayah Meares T on: 12/30/2017 04:33 PM   Modules accepted: Orders

## 2017-12-30 NOTE — Patient Instructions (Signed)
PLEASE FEEL FREE TO CALL OUR OFFICE WITH ANY PROBLEMS OR QUESTIONS ([phone])      

## 2018-02-21 ENCOUNTER — Telehealth: Payer: Self-pay | Admitting: *Deleted

## 2018-02-21 DIAGNOSIS — S062X3S Diffuse traumatic brain injury with loss of consciousness of 1 hour to 5 hours 59 minutes, sequela: Secondary | ICD-10-CM

## 2018-02-21 MED ORDER — METHYLPHENIDATE HCL 10 MG PO TABS
10.0000 mg | ORAL_TABLET | Freq: Two times a day (BID) | ORAL | 0 refills | Status: DC
Start: 2018-02-21 — End: 2018-02-21

## 2018-02-21 MED ORDER — METHYLPHENIDATE HCL 10 MG PO TABS: 10.0000 mg | ORAL_TABLET | Freq: Two times a day (BID) | ORAL | 0 refills | Status: DC

## 2018-02-21 NOTE — Telephone Encounter (Signed)
Tyler Lane called stating Tyler Lane needs his ritalin refilled.  He will be out next week. Last fill date was 01/14/18 by PMP.

## 2018-02-21 NOTE — Telephone Encounter (Signed)
RX written for 2 months

## 2018-02-25 NOTE — Telephone Encounter (Signed)
Left message alerting his daughter of the Rx done.

## 2018-04-28 ENCOUNTER — Encounter: Payer: Medicare HMO | Admitting: Physical Medicine & Rehabilitation

## 2018-05-06 ENCOUNTER — Encounter: Payer: Medicare HMO | Admitting: Physical Medicine & Rehabilitation

## 2018-05-22 ENCOUNTER — Other Ambulatory Visit: Payer: Self-pay

## 2018-05-22 DIAGNOSIS — S062X3S Diffuse traumatic brain injury with loss of consciousness of 1 hour to 5 hours 59 minutes, sequela: Secondary | ICD-10-CM

## 2018-05-22 NOTE — Telephone Encounter (Signed)
Requesting refill on Methylphenidate last filled 04/08/2018 no future appt made.States has enough to last for a week.

## 2018-05-26 MED ORDER — METHYLPHENIDATE HCL 10 MG PO TABS: 10.0000 mg | ORAL_TABLET | Freq: Two times a day (BID) | ORAL | 0 refills | Status: DC

## 2018-05-26 NOTE — Telephone Encounter (Signed)
Med RF'ed. He should be following up with me

## 2018-05-26 NOTE — Telephone Encounter (Signed)
Notified to make an appt.

## 2018-06-10 ENCOUNTER — Encounter: Payer: Medicare HMO | Attending: Physical Medicine & Rehabilitation | Admitting: Physical Medicine & Rehabilitation

## 2018-06-10 ENCOUNTER — Other Ambulatory Visit: Payer: Self-pay

## 2018-06-10 ENCOUNTER — Encounter: Payer: Self-pay | Admitting: Physical Medicine & Rehabilitation

## 2018-06-10 DIAGNOSIS — S062X3S Diffuse traumatic brain injury with loss of consciousness of 1 hour to 5 hours 59 minutes, sequela: Secondary | ICD-10-CM

## 2018-06-10 DIAGNOSIS — R531 Weakness: Secondary | ICD-10-CM

## 2018-06-10 MED ORDER — METHYLPHENIDATE HCL 10 MG PO TABS: 10.0000 mg | ORAL_TABLET | Freq: Two times a day (BID) | ORAL | 0 refills | Status: DC

## 2018-06-10 NOTE — Progress Notes (Signed)
Subjective:    Patient ID: Tyler Lane, male    DOB: December 20, 1950, 68 y.o.   MRN: [id number]  HPI Due to national recommendations of social distancing because of COVID 60, an audio/video tele-health visit is felt to be the most appropriate encounter for this patient at this time. See MyChart message from today for the patient's consent to a tele-health encounter with [facility]. This is a follow up telephone visit for the patient who is at home. MD is at office.    I am meeting with the patient today regarding his TBI. I last saw him November 2019.  For the most part he's doing well. He is taking ritalin in the morning on most days. He still notices fatigue around 4pm each day, wife notices it too. His vision is much better with his prism lenses by ophtho.   He tries to stay active most days. He likes to walk the dog and do things around the house. His mood has been positive although he's tired of being cooped up by the virus. His sleep is fairly stable.     Pain Inventory Average Pain 0 Pain Right Now 0 My pain is aching and no pain  In the last 24 hours, has pain interfered with the following? General activity 0 Relation with others 0 Enjoyment of life 0 What TIME of day is your pain at its worst? no pain Sleep (in general) Good  Pain is worse with: no pain Pain improves with: no pain Relief from Meds: no pain  Mobility walk without assistance  Function retired  Neuro/Psych confusion  Prior Studies Any changes since last visit?  no  Physicians involved in your care Any changes since last visit?  no   Family History  Problem Relation Age of Onset  . Diabetes Mother   . Heart attack Father    Social History   Socioeconomic History  . Marital status: Significant Other    Spouse name: Genice Rouge  . Number of children: 2  . Years of education: Not on file  . Highest education level: Not on file  Occupational History  .  Occupation: retired  Engineer, production  . Financial resource strain: Not on file  . Food insecurity:    Worry: Not on file    Inability: Not on file  . Transportation needs:    Medical: Not on file    Non-medical: Not on file  Tobacco Use  . Smoking status: Former Smoker    Packs/day: 0.00    Years: 20.00    Pack years: 0.00    Types: Cigarettes  . Smokeless tobacco: Never Used  . Tobacco comment: patient unable to respond  Substance and Sexual Activity  . Alcohol use: No  . Drug use: No  . Sexual activity: Not on file  Lifestyle  . Physical activity:    Days per week: Not on file    Minutes per session: Not on file  . Stress: Not on file  Relationships  . Social connections:    Talks on phone: Not on file    Gets together: Not on file    Attends religious service: Not on file    Active member of club or organization: Not on file    Attends meetings of clubs or organizations: Not on file    Relationship status: Not on file  Other Topics Concern  . Not on file  Social History Narrative  . Not on file  Past Surgical History:  Procedure Laterality Date  . NASAL SINUS SURGERY     Past Medical History:  Diagnosis Date  . Depression   . Heart murmur    previously  . High cholesterol   . HOH (hard of hearing)    mild in the right and moderate to severe in the left  . Hypertension   . Tinnitus of left ear    There were no vitals taken for this visit.  Opioid Risk Score:   Fall Risk Score:  `1  Depression screen PHQ 2/9  Depression screen PHQ 2/9 05/27/2017  Decreased Interest 0  Down, Depressed, Hopeless 2  PHQ - 2 Score 2  Altered sleeping 0  Tired, decreased energy 0  Change in appetite 0  Feeling bad or failure about yourself  0  Trouble concentrating 0  Moving slowly or fidgety/restless 0  Suicidal thoughts 0  PHQ-9 Score 2  Difficult doing work/chores Somewhat difficult   Review of Systems  Constitutional: Negative.   HENT: Negative.   Eyes:  Positive for visual disturbance.  Respiratory: Negative.   Cardiovascular: Negative.   Gastrointestinal: Positive for constipation.  Endocrine: Negative.   Genitourinary: Positive for difficulty urinating.  Musculoskeletal: Positive for arthralgias.  Skin: Negative.   Allergic/Immunologic: Negative.   Neurological: Negative.   Hematological: Negative.   Psychiatric/Behavioral: Positive for confusion.  All other systems reviewed and are negative.       Assessment & Plan:  1. 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 Pain Management: tylenol.  4. Mood:continuecelexa--discused today -improved, wife is supportive as always 5. Neuropsych:  -ritalin 10mg  am and 5-10mg  at noon.RF today   -if he wants to just take daily, I'm fine with this. He will try taking it more often at lunch to see if the afternoon fatigue is better. -We will continue the controlled substance monitoring program, this consists of regular clinic visits, examinations, routine drug screening, pill counts as well as use of [place] Controlled Substance Reporting System. [place] was reviewed today.   6. Hx of right displaced sacral and right pubic rami fracture: WBAT 7. ?Dysphagia/dysphonia: likely related to intubation, -seems improved 8.Visual changes:needs single vision lenses to drive.      -Ongoing diplopia.  continue with lenses per ophthalmology 9. Right clavicle fracture: ortho signed off 10. Fatigue: ritalin as above, late day fatigue is likely related to QD ritalin -trazodone for sleep.      -continue with improved sleep hygiene. Needs to keep schedule.   11 minutes of tele-visit time was spent with this patient today. Follow up in 4 months

## 2018-08-28 ENCOUNTER — Telehealth: Payer: Self-pay

## 2018-08-28 DIAGNOSIS — S062X3S Diffuse traumatic brain injury with loss of consciousness of 1 hour to 5 hours 59 minutes, sequela: Secondary | ICD-10-CM

## 2018-08-28 NOTE — Telephone Encounter (Signed)
Patients significant other called stating patient is needing a refill of methylphenidate medication.  Verified PMP, last prescription picked up on 07-17-2018

## 2018-08-29 MED ORDER — METHYLPHENIDATE HCL 10 MG PO TABS: 10.0000 mg | ORAL_TABLET | Freq: Two times a day (BID) | ORAL | 0 refills | Status: DC

## 2018-08-29 NOTE — Telephone Encounter (Signed)
Notified daughter of the refills (per the directions in dpr).

## 2018-08-29 NOTE — Telephone Encounter (Signed)
Med refilled with another rx for august

## 2018-09-12 ENCOUNTER — Other Ambulatory Visit: Payer: Self-pay | Admitting: Physical Medicine & Rehabilitation

## 2018-09-12 DIAGNOSIS — F5101 Primary insomnia: Secondary | ICD-10-CM

## 2018-09-30 IMAGING — DX DG CHEST 1V PORT
1 series · 1 of 1 positions shown · non-contrast
Comparison: 06/23/2016

CLINICAL DATA: Decreased breath sounds, history of chest trauma,
new rhonchi

EXAM:
PORTABLE CHEST 1 VIEW

[chest ap]
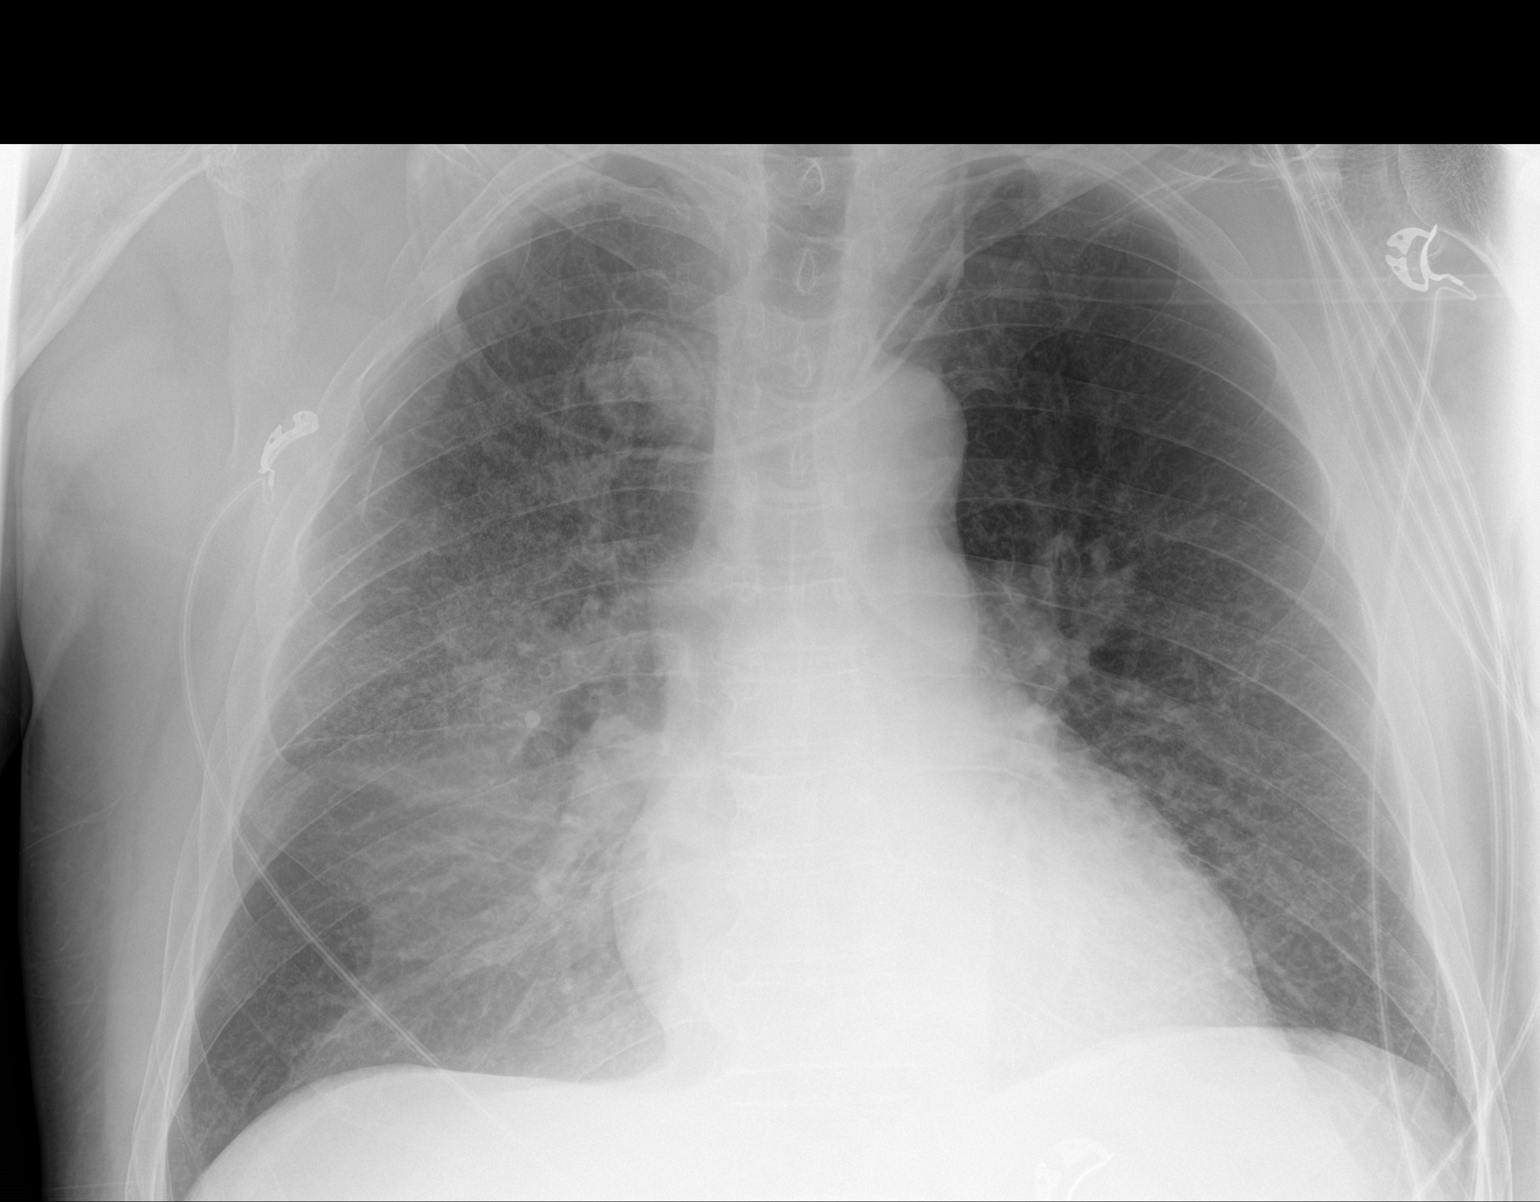

[1 of 1 positions shown; findings below may reference images not displayed]

FINDINGS: Left lung is clear.

Patchy right upper lobe opacity, suspicious for pneumonia, possibly
on the basis of aspiration.

Additional hazy opacity overlying the right lower lung, likely
reflecting a pleural effusion, possibly loculated.

No pneumothorax is seen.

The heart is normal in size.
IMPRESSION: Patchy right upper lobe opacity, suspicious for pneumonia, possibly
on the basis of aspiration.

Suspected right pleural effusion.

## 2018-10-02 IMAGING — DX DG CHEST 1V PORT
1 series · 1 of 1 positions shown · non-contrast
Comparison: 06/24/2016 .  06/23/2016.

CLINICAL DATA: Fever.

EXAM:
PORTABLE CHEST 1 VIEW

[chest ap]
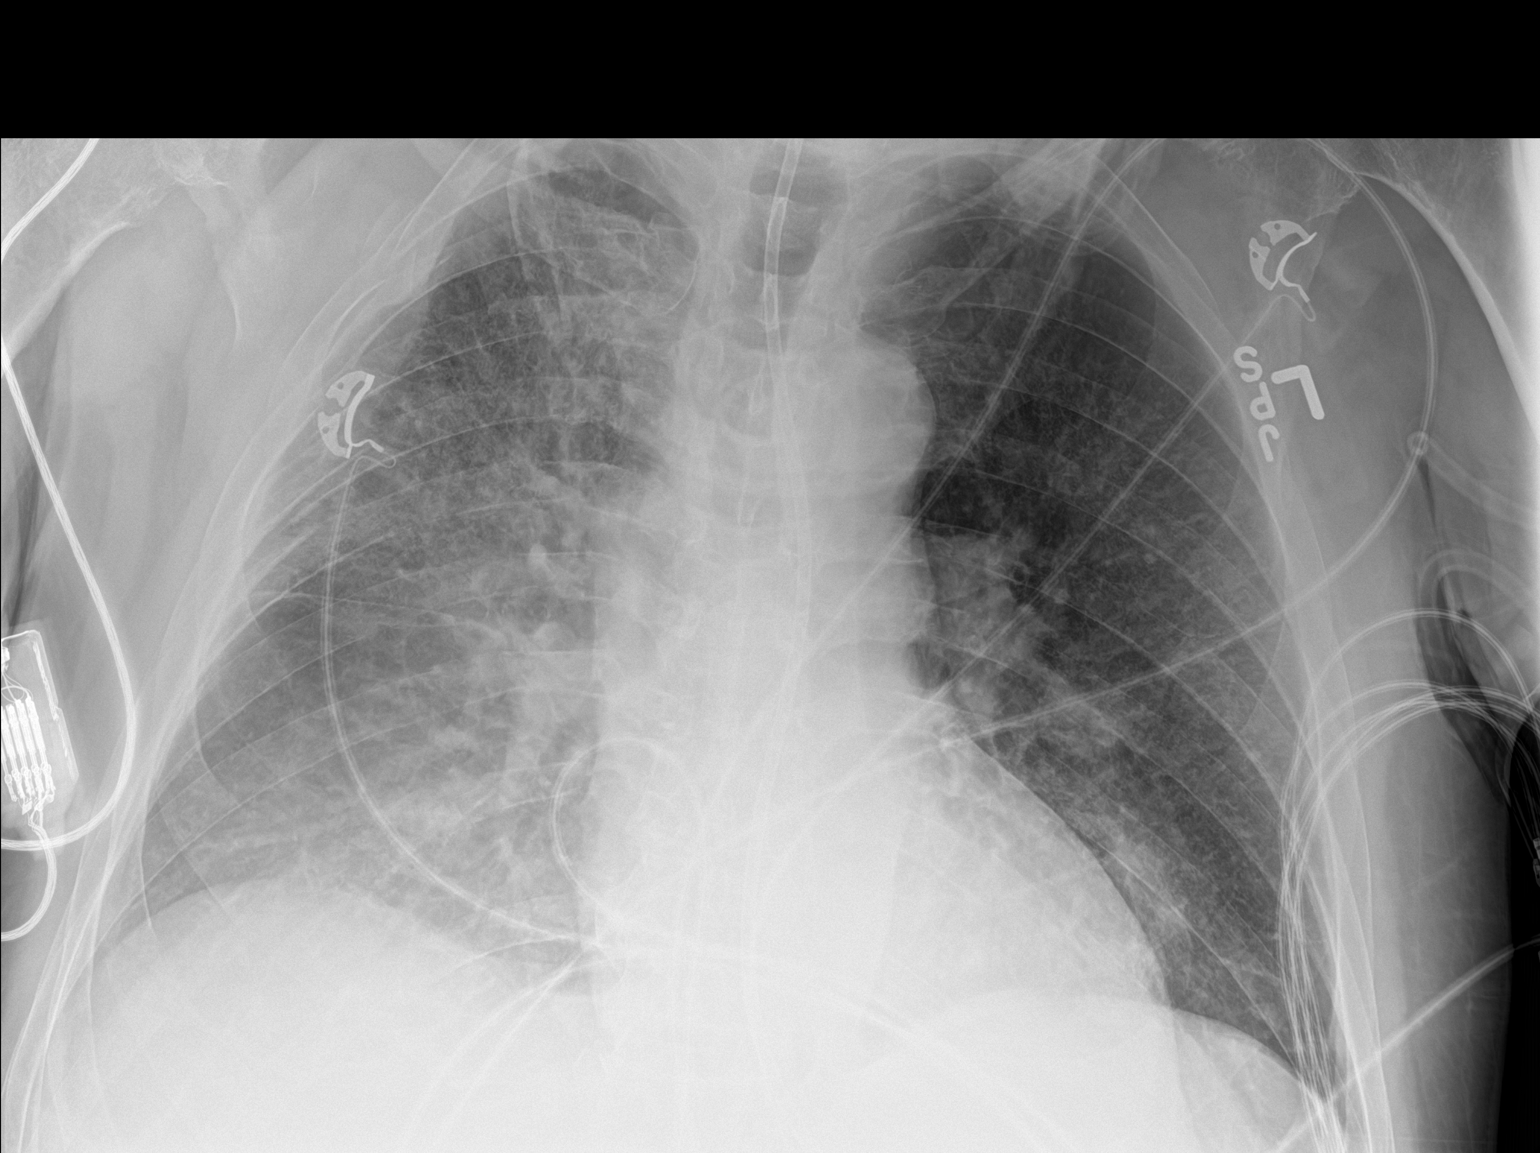

[1 of 1 positions shown; findings below may reference images not displayed]

FINDINGS: Feeding tube noted with tip below left hemidiaphragm. Heart size
stable. Diffuse right lung infiltrate, progressive from prior exam.
Mild left base infiltrate. Small right pleural effusion again cannot
be excluded . No pneumothorax. Heart size stable. Multiple right
ribs and right clavicular fractures noted.
IMPRESSION: 1.  Feeding tube noted with tip below left hemidiaphragm.

2. Diffuse right lung infiltrate, progressive prior exam. Mild left
base infiltrate is also noted. Small right pleural effusion again
cannot be excluded .

## 2018-10-04 IMAGING — DX DG CHEST 1V PORT
1 series · 1 of 1 positions shown · non-contrast
Comparison: Chest x-ray of June 27, 2016 and June 26, 2016

CLINICAL DATA: Follow-up right-sided infiltrate.

EXAM:
PORTABLE CHEST 1 VIEW

[chest ap]
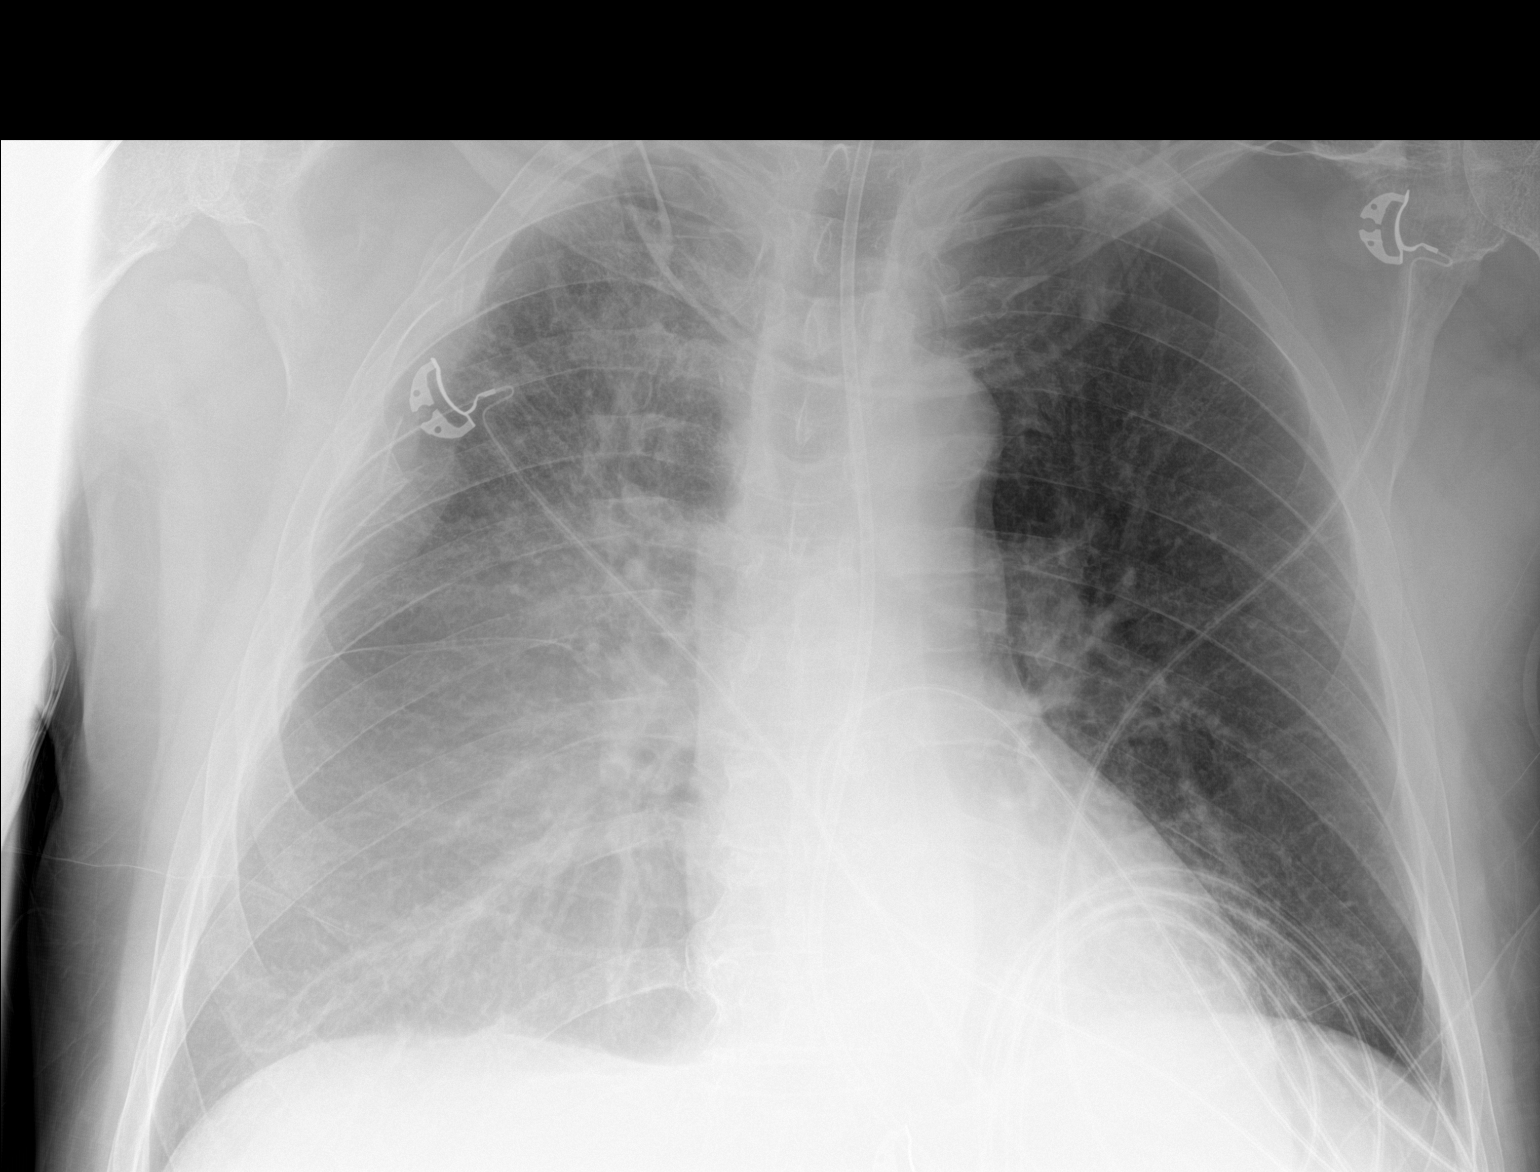

[1 of 1 positions shown; findings below may reference images not displayed]

FINDINGS: There is persistent increased density throughout the right lung
especially in its mid and lower portions. The left lung is
well-expanded and clear. There is no mediastinal shift. The heart
and pulmonary vascularity are normal. There is calcification in the
wall of the aortic arch. The feeding tube tip projects below the
inferior margin of the image. Multiple right-sided rib fractures are
again demonstrated.
IMPRESSION: Persistent right-sided infiltrate compatible with pneumonia.
Possible posterior layering of pleural fluid as well. No visible
pneumothorax. When the patient can tolerate the procedure, a PA and
lateral chest x-ray would be useful.

## 2018-10-07 ENCOUNTER — Ambulatory Visit: Payer: Medicare HMO | Admitting: Physical Medicine & Rehabilitation

## 2018-10-09 IMAGING — CR DG CHEST 2V
2 series · 2 of 2 positions shown · non-contrast
Comparison: PA and lateral chest 06/29/2016. Single view of the
chest 06/28/2016. CT chest 06/21/2016.

CLINICAL DATA: Shortness of breath. The patient suffered multiple
rib fractures of fall from a ladder 06/21/2016.

EXAM:
CHEST  2 VIEW

[chest pa]
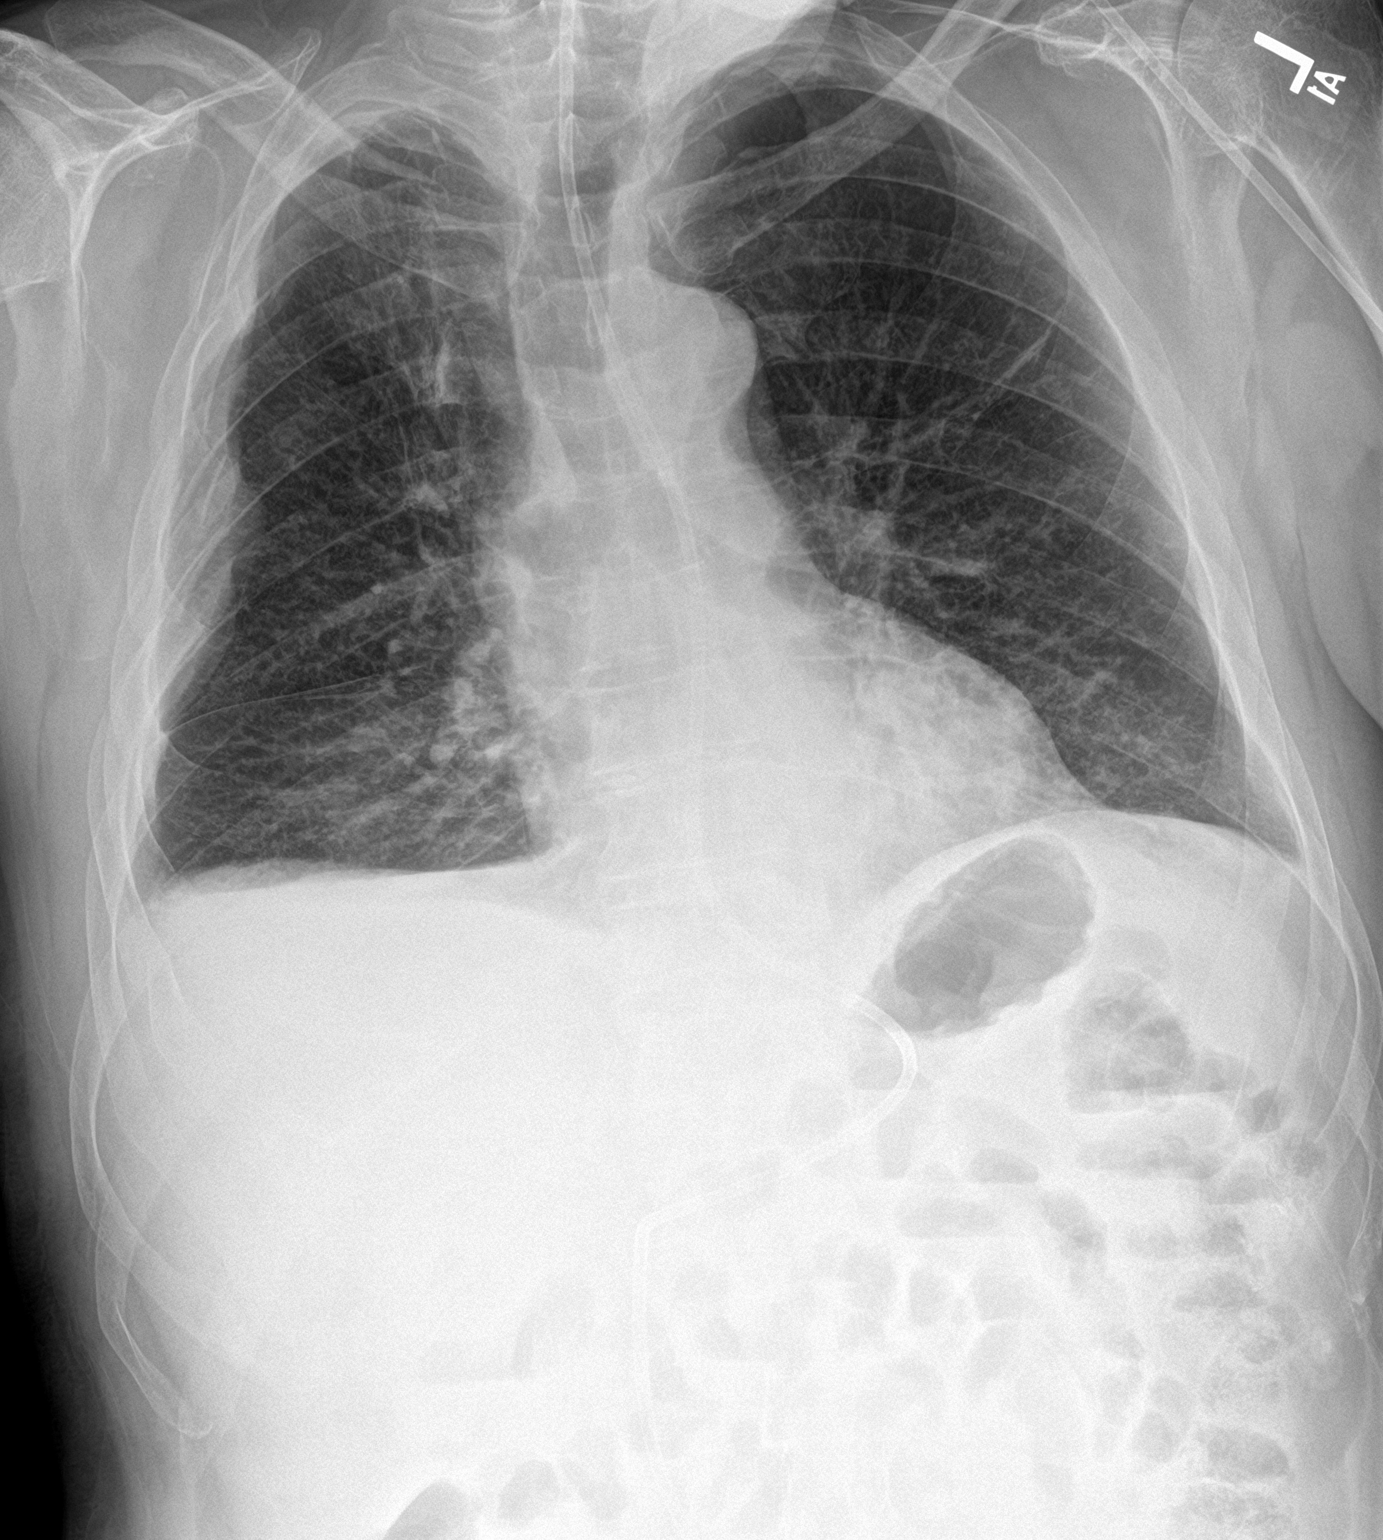

[chest lat]
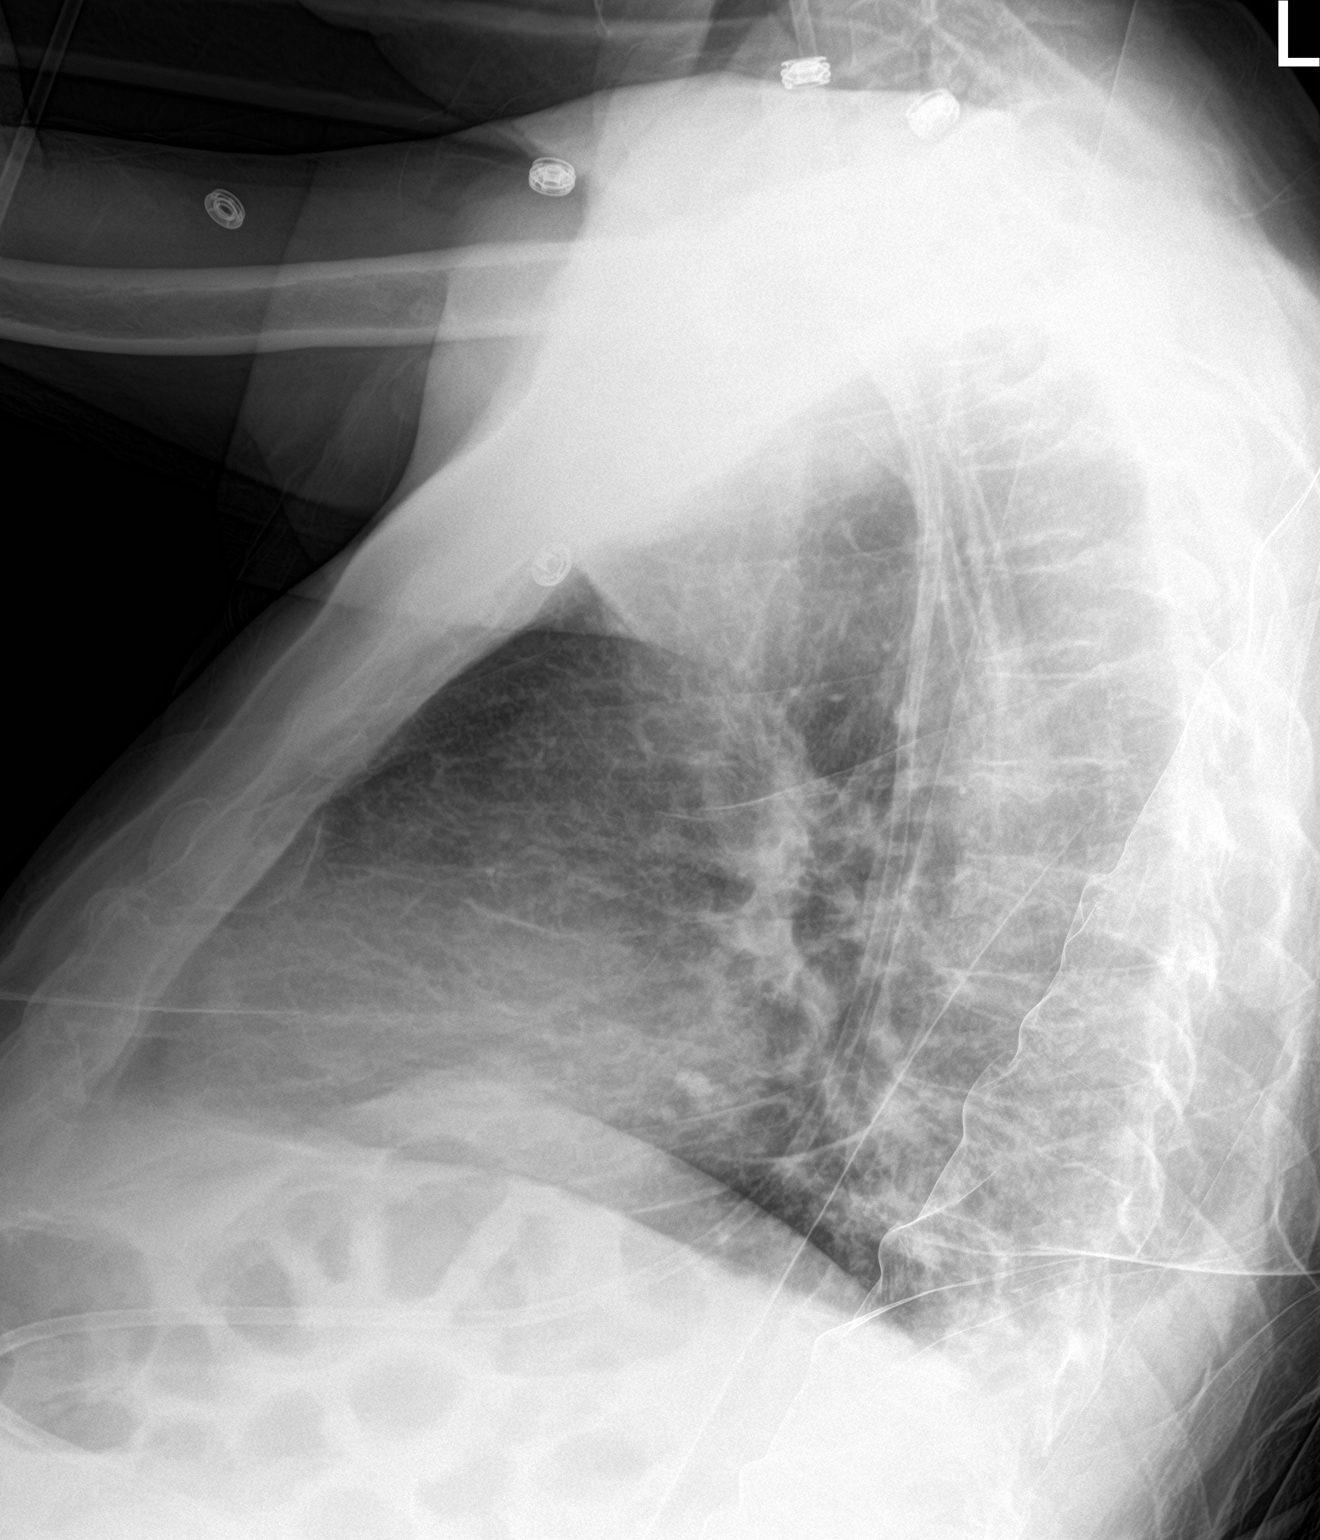

[2 of 2 positions shown; findings below may reference images not displayed]

FINDINGS: Right pleural effusion and basilar airspace disease have markedly
improved. The left lung is clear. There is no pneumothorax. Heart
size is normal. Aortic atherosclerosis is noted. Multiple right rib
fractures are again seen. Feeding tube courses into the duodenum and
below the inferior margin of the film.
IMPRESSION: Marked improvement in a right pleural effusion basilar airspace
disease.

Multiple right rib fractures as seen on prior examinations. No
pneumothorax.

## 2018-10-22 ENCOUNTER — Other Ambulatory Visit: Payer: Self-pay

## 2018-10-22 ENCOUNTER — Encounter: Payer: Medicare HMO | Attending: Physical Medicine & Rehabilitation | Admitting: Physical Medicine & Rehabilitation

## 2018-10-22 ENCOUNTER — Encounter: Payer: Self-pay | Admitting: Physical Medicine & Rehabilitation

## 2018-10-22 VITALS — BP 122/78 | HR 55 | Temp 97.9°F | Ht 71.0 in | Wt 189.0 lb

## 2018-10-22 DIAGNOSIS — S062X3S Diffuse traumatic brain injury with loss of consciousness of 1 hour to 5 hours 59 minutes, sequela: Secondary | ICD-10-CM | POA: Diagnosis not present

## 2018-10-22 DIAGNOSIS — Z5181 Encounter for therapeutic drug level monitoring: Secondary | ICD-10-CM | POA: Diagnosis not present

## 2018-10-22 DIAGNOSIS — Z79891 Long term (current) use of opiate analgesic: Secondary | ICD-10-CM | POA: Insufficient documentation

## 2018-10-22 DIAGNOSIS — G894 Chronic pain syndrome: Secondary | ICD-10-CM

## 2018-10-22 MED ORDER — METHYLPHENIDATE HCL 10 MG PO TABS: 10.0000 mg | ORAL_TABLET | Freq: Two times a day (BID) | ORAL | 0 refills | Status: DC

## 2018-10-22 NOTE — Progress Notes (Signed)
Subjective:    Patient ID: Tyler Lane, male    DOB: 25-Jun-1950, 68 y.o.   MRN: [id number]  HPI Tyler Lane is here in follow-up of his traumatic brain injury.  We last spoke in April via telephone.  I last physically saw him in November 2019. He is just now getting over some "walking pneumonia". He's off abx.  He reports that he still is having some coughing and associated upper back pain due to the coughing.  He denies any fever.  He has not been walking as much of late due to the symptoms.  He also is complaining of some left upper, anterior thigh pain which has been present for about 2 months.  Denies any accidents or precipitating events.  Left shoulder has bothered him at times as well.  The leg seem to bother him most when he is in bed and tries to lift the leg against gravity.  Shoulder bothers him sometimes with overhead movements or when he reaches with the arm.  Remains on Ritalin for attention and focus for baseline.  He keeps up with his cognitive exercises.   Pain Inventory Average Pain 3 Pain Right Now 4 My pain is aching  In the last 24 hours, has pain interfered with the following? General activity 3 Relation with others 3 Enjoyment of life 3 What TIME of day is your pain at its worst? varies Sleep (in general) Good  Pain is worse with: weather changes Pain improves with: na Relief from Meds: na  Mobility how many minutes can you walk? 30 ability to climb steps?  yes do you drive?  yes  Function retired  Neuro/Psych No problems in this area  Prior Studies [place]  Physicians involved in your care Any changes since last visit?  no   Family History  Problem Relation Age of Onset  . Diabetes Mother   . Heart attack Father    Social History   Socioeconomic History  . Marital status: Significant Other    Spouse name: Tyler Lane  . Number of children: 2  . Years of education: Not on file  . Highest education level: Not on file  Occupational  History  . Occupation: retired  Scientific laboratory technician  . Financial resource strain: Not on file  . Food insecurity    Worry: Not on file    Inability: Not on file  . Transportation needs    Medical: Not on file    Non-medical: Not on file  Tobacco Use  . Smoking status: Former Smoker    Packs/day: 0.00    Years: 20.00    Pack years: 0.00    Types: Cigarettes  . Smokeless tobacco: Never Used  . Tobacco comment: patient unable to respond  Substance and Sexual Activity  . Alcohol use: No  . Drug use: No  . Sexual activity: Not on file  Lifestyle  . Physical activity    Days per week: Not on file    Minutes per session: Not on file  . Stress: Not on file  Relationships  . Social Herbalist on phone: Not on file    Gets together: Not on file    Attends religious service: Not on file    Active member of club or organization: Not on file    Attends meetings of clubs or organizations: Not on file    Relationship status: Not on file  Other Topics Concern  . Not on file  Social History Narrative  .  Not on file   Past Surgical History:  Procedure Laterality Date  . NASAL SINUS SURGERY     Past Medical History:  Diagnosis Date  . Depression   . Heart murmur    previously  . High cholesterol   . HOH (hard of hearing)    mild in the right and moderate to severe in the left  . Hypertension   . Tinnitus of left ear    BP 122/78   Pulse (!) 55   Temp 97.9 F (36.6 C)   Ht 5\' 11"  (1.803 m)   Wt 189 lb (85.7 kg)   SpO2 96%   BMI 26.36 kg/m   Opioid Risk Score:   Fall Risk Score:  `1  Depression screen PHQ 2/9  Depression screen PHQ 2/9 05/27/2017  Decreased Interest 0  Down, Depressed, Hopeless 2  PHQ - 2 Score 2  Altered sleeping 0  Tired, decreased energy 0  Change in appetite 0  Feeling bad or failure about yourself  0  Trouble concentrating 0  Moving slowly or fidgety/restless 0  Suicidal thoughts 0  PHQ-9 Score 2  Difficult doing work/chores  Somewhat difficult    Review of Systems  Constitutional: Positive for fever.  HENT: Negative.   Eyes: Negative.   Respiratory: Positive for choking, shortness of breath and wheezing.   Cardiovascular: Negative.   Gastrointestinal: Negative.   Endocrine: Negative.   Genitourinary: Negative.   Musculoskeletal: Negative.   Skin: Negative.   Allergic/Immunologic: Negative.   Neurological: Negative.   Hematological: Negative.   Psychiatric/Behavioral: Negative.   All other systems reviewed and are negative.      Objective:   Physical Exam  Gen: no distress, normal appearing HEENT: oral mucosa pink and moist, NCAT Cardio: Reg rate Chest: normal effort, normal rate of breathing. No cough.  Abd: soft, non-distended Ext: no edema Skin: intact Neuro: cognition at baseline. Attention inconsistent but better. Motor 5/5. Slight steppage gait Musculoskeletal: mild rectus femoris pain with palpation but not resistance. Mild impingement sign Psych: pleasant, normal affect       Assessment & Plan:  1. 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 Pain Management: left quad tendon strain, mild left RTC strain?    -discussed importance of appropriate mechanics   -HEP was provided and reviewed 4. Mood:continuecelexa--discused today -improved, wife is supportiveas always 5. Neuropsych:  -ritalin 10mg  am and 5-10mg  at noon. RF today                  -may use daily  -We will continue the controlled substance monitoring program, this consists of regular clinic visits, examinations, routine drug screening, pill counts as well as use of [place] Controlled Substance Reporting System. [place] was [place] today.    -UDS today 6. Hx of right displaced sacral and right pubic rami fracture: WBAT 7. ?Dysphagia/dysphonia: likely  related to intubation,-seems improved 8.Visual changes:needs single vision lenses to drive. -Ongoing diplopia. continue with lenses per ophthalmology 9. Right clavicle fracture: ortho signed off 10. Fatigue: -trazodone for sleep. 11. Pulmonary: follow up with primary if further issues  Fifteen minutes of face to face patient care time were spent during this visit. All questions were encouraged and answered.  Follow up with me in 6 months .

## 2018-10-22 NOTE — Patient Instructions (Signed)
CONTINUE TO WALK AND WORK ON YOUR RANGE OF MOTION AND EXERCISE AS WELL!

## 2018-10-24 LAB — TOXASSURE SELECT,+ANTIDEPR,UR

## 2018-10-29 ENCOUNTER — Telehealth: Payer: Self-pay | Admitting: *Deleted

## 2018-10-29 NOTE — Telephone Encounter (Signed)
Urine drug screen for this encounter is consistent for prescribed medication 

## 2019-01-07 ENCOUNTER — Telehealth: Payer: Self-pay | Admitting: *Deleted

## 2019-01-07 DIAGNOSIS — S062X3S Diffuse traumatic brain injury with loss of consciousness of 1 hour to 5 hours 59 minutes, sequela: Secondary | ICD-10-CM

## 2019-01-07 MED ORDER — METHYLPHENIDATE HCL 10 MG PO TABS: 10.0000 mg | ORAL_TABLET | Freq: Two times a day (BID) | ORAL | 0 refills | Status: DC

## 2019-01-07 NOTE — Telephone Encounter (Signed)
Horris [facility] called and reported that Tyler Lane needs refill on his Methylphenidate 10 mg.  Per PMP the last fill date was 11/23/18.His next appt is 04/22/19.

## 2019-01-07 NOTE — Telephone Encounter (Signed)
Notified [employer].Marland Kitchen

## 2019-01-07 NOTE — Telephone Encounter (Signed)
Med rf'ed 

## 2019-04-07 ENCOUNTER — Telehealth: Payer: Self-pay

## 2019-04-07 ENCOUNTER — Other Ambulatory Visit: Payer: Self-pay | Admitting: Physical Medicine & Rehabilitation

## 2019-04-07 DIAGNOSIS — F5101 Primary insomnia: Secondary | ICD-10-CM

## 2019-04-07 DIAGNOSIS — S062X3S Diffuse traumatic brain injury with loss of consciousness of 1 hour to 5 hours 59 minutes, sequela: Secondary | ICD-10-CM

## 2019-04-07 NOTE — Telephone Encounter (Signed)
Refill request for Methylphendiate last filled 02/23/2019 next appt 05/06/2019

## 2019-04-08 MED ORDER — METHYLPHENIDATE HCL 10 MG PO TABS: 10.0000 mg | ORAL_TABLET | Freq: Two times a day (BID) | ORAL | 0 refills | Status: DC

## 2019-04-08 NOTE — Telephone Encounter (Signed)
Med RF'ed today.  

## 2019-04-08 NOTE — Telephone Encounter (Signed)
Patient notified

## 2019-04-22 ENCOUNTER — Ambulatory Visit: Payer: Medicare HMO | Admitting: Physical Medicine & Rehabilitation

## 2019-05-06 ENCOUNTER — Other Ambulatory Visit: Payer: Self-pay

## 2019-05-06 ENCOUNTER — Encounter: Payer: Medicare HMO | Attending: Physical Medicine & Rehabilitation | Admitting: Physical Medicine & Rehabilitation

## 2019-05-06 ENCOUNTER — Encounter: Payer: Self-pay | Admitting: Physical Medicine & Rehabilitation

## 2019-05-06 VITALS — BP 119/73 | HR 71 | Temp 97.7°F | Ht 71.0 in | Wt 192.0 lb

## 2019-05-06 DIAGNOSIS — Z5181 Encounter for therapeutic drug level monitoring: Secondary | ICD-10-CM | POA: Diagnosis not present

## 2019-05-06 DIAGNOSIS — F988 Other specified behavioral and emotional disorders with onset usually occurring in childhood and adolescence: Secondary | ICD-10-CM | POA: Diagnosis not present

## 2019-05-06 DIAGNOSIS — Z79891 Long term (current) use of opiate analgesic: Secondary | ICD-10-CM | POA: Diagnosis not present

## 2019-05-06 DIAGNOSIS — S069X3D Unspecified intracranial injury with loss of consciousness of 1 hour to 5 hours 59 minutes, subsequent encounter: Secondary | ICD-10-CM

## 2019-05-06 DIAGNOSIS — S062X3S Diffuse traumatic brain injury with loss of consciousness of 1 hour to 5 hours 59 minutes, sequela: Secondary | ICD-10-CM | POA: Diagnosis present

## 2019-05-06 MED ORDER — METHYLPHENIDATE HCL 10 MG PO TABS: 10.0000 mg | ORAL_TABLET | Freq: Two times a day (BID) | ORAL | 0 refills | Status: DC

## 2019-05-06 NOTE — Progress Notes (Signed)
Subjective:    Patient ID: Tyler Lane, male    DOB: [birth year], 69 y.o.   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 He walks regularly often with his dog. If he exercises in the morning, he will frequently take a nap in the late morning.  He states that he sleeping well at nighttime.  He is also having band like pain over his mid back.  It is often worse after he walks.  He is walking most days.  He is not doing much in the way of regular stretching.  He has not used a heating pad.  He is not taking anything for pain at this point other than occasional Voltaren gel.  He is also noticed some pain over the lateral left hip which can be more prominent after activity.  He would like to go play golf again but is worried that he may exacerbate his low back pain.  I did review CT scans of his spine from the time of his injury which note some old compression fractures in the thoracic vertebrae.     Pain Inventory Average Pain 5 Pain Right Now 2 My pain is dull and aching  In the last 24 hours, has pain interfered with the following? General activity 0 Relation with others 1 Enjoyment of life 5 What TIME of day is your pain at its worst? night Sleep (in general) Good  Pain is worse with: walking and standing Pain improves with: rest Relief from Meds: 0  Mobility walk without assistance ability to climb steps?  yes do you drive?  yes  Function retired  Neuro/Psych bladder control problems  Prior Studies Any changes since last visit?  no  Physicians involved in your care Any changes since last visit?  no   Family History  Problem Relation Age of Onset  . Diabetes Mother   . Heart attack Father    Social History   Socioeconomic History  . Marital status: Significant Other    Spouse name: [name]  . Number of children: 2  . Years of education: Not on file  . Highest education level: Not on file  Occupational  History  . Occupation: retired  Tobacco Use  . Smoking status: Former Smoker    Packs/day: 0.00    Years: 20.00    Pack years: 0.00    Types: Cigarettes  . Smokeless tobacco: Never Used  . Tobacco comment: patient unable to respond  Substance and Sexual Activity  . Alcohol use: No  . Drug use: No  . Sexual activity: Not on file  Other Topics Concern  . Not on file  Social History Narrative  . Not on file   Social Determinants of Health   Financial Resource Strain:   . Difficulty of Paying Living Expenses:   Food Insecurity:   . Worried About Charity fundraiser in the Last Year:   . Arboriculturist in the Last Year:   Transportation Needs:   . Film/video editor (Medical):   Marland Kitchen Lack of Transportation (Non-Medical):   Physical Activity:   . Days of Exercise per Week:   . Minutes of Exercise per Session:   Stress:   . Feeling of Stress :   Social Connections:   . Frequency of Communication with Friends and Family:   . Frequency of Social Gatherings with Friends and Family:   . Attends Religious Services:   .  Active Member of Clubs or Organizations:   . Attends Banker Meetings:   Marland Kitchen Marital Status:    Past Surgical History:  Procedure Laterality Date  . NASAL SINUS SURGERY     Past Medical History:  Diagnosis Date  . Depression   . Heart murmur    previously  . High cholesterol   . HOH (hard of hearing)    mild in the right and moderate to severe in the left  . Hypertension   . Tinnitus of left ear    BP 119/73   Pulse 71   Temp 97.7 F (36.5 C)   Ht 5\' 11"  (1.803 m)   Wt 192 lb (87.1 kg)   SpO2 94%   BMI 26.78 kg/m   Opioid Risk Score:   Fall Risk Score:  `1  Depression screen PHQ 2/9  Depression screen PHQ 2/9 05/27/2017  Decreased Interest 0  Down, Depressed, Hopeless 2  PHQ - 2 Score 2  Altered sleeping 0  Tired, decreased energy 0  Change in appetite 0  Feeling bad or failure about yourself  0  Trouble concentrating 0    Moving slowly or fidgety/restless 0  Suicidal thoughts 0  PHQ-9 Score 2  Difficult doing work/chores Somewhat difficult    Review of Systems  Constitutional: Positive for unexpected weight change.  HENT: Negative.   Eyes: Negative.   Respiratory: Negative.   Cardiovascular: Negative.   Gastrointestinal: Negative.   Endocrine: Negative.   Genitourinary: Negative.   Musculoskeletal: Negative.        Spasms   Skin: Negative.   Allergic/Immunologic: Negative.   Neurological: Negative.   Hematological: Negative.   Psychiatric/Behavioral: Negative.   All other systems reviewed and are negative.      Objective:   Physical Exam  General: No acute distress HEENT: EOMI, oral membranes moist Cards: reg rate  Chest: normal effort Abdomen: Soft, NT, ND Skin: dry, intact Extremities: no edema  Neuro: cognition at baseline. Attention inconsistent but better. Motor 5/5.  Gait stable and improving Musculoskeletal:  Mild mid to low back tenderness.  Lumbar and thoracic paraspinals specifically are tight.  He is limited with flexion and extension.  He is able to transfer easily.  Only mild pain in shoulders with range of motion today. Psych: Very pleasant and cooperative       Assessment & Plan:  1. 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 Pain Management:   -Has mild mid back to low back pain.  A lot of it appears myofascial.  He does have thoracic compression fractures on old CT scan.  -Discussed home exercise program.  Specific exercises were provided and reviewed today.  Stretching will be beneficial in the mornings and before he walks  -Would benefit from a heating pad in the morning  -Discovered other alternatives for exercise as well.  -If pain persists can consider further work-up imaging 4. Mood:continuecelexa--discused today -improved, wife is supportiveas always 5. Neuropsych:   -ritalin 10mg  am and 5-10mg  at noon. RF today for next month -may use daily  -We will continue the controlled substance monitoring program, this consists of regular clinic visits, examinations, routine drug screening, pill counts as well as use of 05/29/2017 Controlled Substance Reporting System. [place] was reviewed today.    -UDS today       6. Hx of right displaced sacral and right pubic rami fracture: WBAT 7. ?Dysphagia/dysphonia: likely related to intubation,-seems improved 8.Visual changes:needs single vision  lenses to drive. -Ongoing diplopia.  per ophthalmology 9. Right clavicle fracture: ortho signed off 10. Fatigue: -trazodone for sleep.  -Okay to take morning nap especially after exercise.  15 minutes of face to face patient care time were spent during this visit. All questions were encouraged and answered.  Follow up with me in 4 months .

## 2019-05-06 NOTE — Patient Instructions (Addendum)
HEATING PAD IN THE MORNING BEFORE YOU GET UP   DAILY STRETCHES IN THE MORNING AND BEFORE/AFTER EXERCISE  GOOD POSTURE WHEN YOU WALK, BEND, ETC.

## 2019-07-09 ENCOUNTER — Other Ambulatory Visit: Payer: Self-pay | Admitting: Physical Medicine & Rehabilitation

## 2019-07-09 DIAGNOSIS — F5101 Primary insomnia: Secondary | ICD-10-CM

## 2019-07-14 ENCOUNTER — Telehealth: Payer: Self-pay | Admitting: *Deleted

## 2019-07-14 ENCOUNTER — Telehealth: Payer: Self-pay

## 2019-07-14 DIAGNOSIS — S062X3S Diffuse traumatic brain injury with loss of consciousness of 1 hour to 5 hours 59 minutes, sequela: Secondary | ICD-10-CM

## 2019-07-14 MED ORDER — METHYLPHENIDATE HCL 10 MG PO TABS: 10.0000 mg | ORAL_TABLET | Freq: Two times a day (BID) | ORAL | 0 refills | Status: DC

## 2019-07-14 NOTE — Telephone Encounter (Signed)
Notified Bonnie

## 2019-07-14 NOTE — Telephone Encounter (Signed)
It's fine for him to get the implants.

## 2019-07-14 NOTE — Telephone Encounter (Signed)
Ritalin refilled

## 2019-07-14 NOTE — Telephone Encounter (Signed)
Requesting refill on Methylphenidate. Next appt 09/02/2019

## 2019-07-14 NOTE — Telephone Encounter (Signed)
Patients wife concerned.  Patient decided to get dental implants.  Part one is done.Tyler KitchenMarland KitchenMarland KitchenMarland Kitchenwife is concerned, wants to know, should he be getting these procedures without seeing Dr. Riley Kill fiirst

## 2019-08-21 ENCOUNTER — Telehealth: Payer: Self-pay | Admitting: *Deleted

## 2019-08-21 DIAGNOSIS — S062X3S Diffuse traumatic brain injury with loss of consciousness of 1 hour to 5 hours 59 minutes, sequela: Secondary | ICD-10-CM

## 2019-08-21 MED ORDER — METHYLPHENIDATE HCL 10 MG PO TABS: 10.0000 mg | ORAL_TABLET | Freq: Two times a day (BID) | ORAL | 0 refills | Status: DC

## 2019-08-21 NOTE — Telephone Encounter (Signed)
Ritalin filled for this month and next

## 2019-08-21 NOTE — Telephone Encounter (Signed)
Tyler Lane has an appt 09/02/19 but his methylphenidate will run out before the appt so he needs a refill.

## 2019-09-02 ENCOUNTER — Encounter: Payer: Self-pay | Admitting: Physical Medicine & Rehabilitation

## 2019-09-02 ENCOUNTER — Encounter: Payer: Medicare HMO | Attending: Physical Medicine & Rehabilitation | Admitting: Physical Medicine & Rehabilitation

## 2019-09-02 ENCOUNTER — Other Ambulatory Visit: Payer: Self-pay

## 2019-09-02 VITALS — BP 137/86 | HR 71 | Temp 98.7°F | Ht 71.0 in | Wt 188.0 lb

## 2019-09-02 DIAGNOSIS — Z5181 Encounter for therapeutic drug level monitoring: Secondary | ICD-10-CM | POA: Diagnosis not present

## 2019-09-02 DIAGNOSIS — G894 Chronic pain syndrome: Secondary | ICD-10-CM | POA: Insufficient documentation

## 2019-09-02 DIAGNOSIS — F988 Other specified behavioral and emotional disorders with onset usually occurring in childhood and adolescence: Secondary | ICD-10-CM | POA: Insufficient documentation

## 2019-09-02 DIAGNOSIS — Z79891 Long term (current) use of opiate analgesic: Secondary | ICD-10-CM | POA: Diagnosis not present

## 2019-09-02 DIAGNOSIS — S062X3S Diffuse traumatic brain injury with loss of consciousness of 1 hour to 5 hours 59 minutes, sequela: Secondary | ICD-10-CM | POA: Diagnosis present

## 2019-09-02 NOTE — Patient Instructions (Addendum)
PLEASE FEEL FREE TO CALL OUR OFFICE WITH ANY PROBLEMS OR QUESTIONS [phone])   TRY 1/2 TRAZODONE FOR A FEW DAYS. IF SLEEP UNAFFECTED, THEN STOP AND USE JUST AS NEEDED FOR INSOMNIA.     ?THIRD RITALIN DOSE IN LATE AFTERNOON .     LIDOCAINE OINTMENT TO SENSITIVE AREAS ON SCALP

## 2019-09-02 NOTE — Progress Notes (Signed)
Subjective:    Patient ID: Tyler Lane, male    DOB: 06-12-1950, 69 y.o.   MRN: [id number]  HPI  Tyler Lane is here in follow up of his TBI and associated deficits.  For the most part he has been doing quite well.  He maintains on Ritalin during the day for attention as well as energy.  He does find that during the late afternoon he will fatigue and needs a nap.  His primary care doctor is suggested perhaps taking a lower dose of trazodone.  He is only using 50 mg at nighttime currently.  He has some sensitive area still on his scalp which can be a nuisance.  He recently had some dental implants and some other modifications which have been a nuisance from the standpoint of his chewing and speech.  The teeth on the bottom tend to come loose frequently and are distracting to him.  Also they make it difficult to chew.  He is excited to be traveling up to [place] to visit his great-granddaughter.  He is taking an airplane.   Pain Inventory Average Pain 2 Pain Right Now 0 My pain is intermittent and dull  In the last 24 hours, has pain interfered with the following? General activity 4 Relation with others 0 Enjoyment of life 0 What TIME of day is your pain at its worst? daytime Sleep (in general) Good  Pain is worse with: walking Pain improves with: ? Relief from Meds: ?  Mobility walk without assistance  Function retired  Neuro/Psych dizziness  Prior Studies Any changes since last visit?  no  Physicians involved in your care     Family History  Problem Relation Age of Onset  . Diabetes Mother   . Heart attack Father    Social History   Socioeconomic History  . Marital status: Significant Other    Spouse name: Genice Rouge  . Number of children: 2  . Years of education: Not on file  . Highest education level: Not on file  Occupational History  . Occupation: retired  Tobacco Use  . Smoking status: Former Smoker    Packs/day: 0.00    Years: 20.00    Pack  years: 0.00    Types: Cigarettes  . Smokeless tobacco: Never Used  . Tobacco comment: patient unable to respond  Vaping Use  . Vaping Use: Never used  Substance and Sexual Activity  . Alcohol use: No  . Drug use: No  . Sexual activity: Not on file  Other Topics Concern  . Not on file  Social History Narrative  . Not on file   Social Determinants of Health   Financial Resource Strain:   . Difficulty of Paying Living Expenses:   Food Insecurity:   . Worried About Programme researcher, broadcasting/film/video in the Last Year:   . Barista in the Last Year:   Transportation Needs:   . Freight forwarder (Medical):   Marland Kitchen Lack of Transportation (Non-Medical):   Physical Activity:   . Days of Exercise per Week:   . Minutes of Exercise per Session:   Stress:   . Feeling of Stress :   Social Connections:   . Frequency of Communication with Friends and Family:   . Frequency of Social Gatherings with Friends and Family:   . Attends Religious Services:   . Active Member of Clubs or Organizations:   . Attends Banker Meetings:   Marland Kitchen Marital Status:    Past  Surgical History:  Procedure Laterality Date  . NASAL SINUS SURGERY     Past Medical History:  Diagnosis Date  . Depression   . Heart murmur    previously  . High cholesterol   . HOH (hard of hearing)    mild in the right and moderate to severe in the left  . Hypertension   . Tinnitus of left ear    BP 137/86   Pulse 71   Temp 98.7 F (37.1 C)   Ht 5\' 11"  (1.803 m)   Wt 188 lb (85.3 kg)   SpO2 93%   BMI 26.22 kg/m   Opioid Risk Score:   Fall Risk Score:  `1  Depression screen PHQ 2/9  Depression screen PHQ 2/9 05/27/2017  Decreased Interest 0  Down, Depressed, Hopeless 2  PHQ - 2 Score 2  Altered sleeping 0  Tired, decreased energy 0  Change in appetite 0  Feeling bad or failure about yourself  0  Trouble concentrating 0  Moving slowly or fidgety/restless 0  Suicidal thoughts 0  PHQ-9 Score 2  Difficult  doing work/chores Somewhat difficult    Review of Systems  Constitutional: Negative.   HENT: Negative.   Eyes: Negative.   Respiratory: Negative.   Cardiovascular: Negative.   Gastrointestinal: Negative.   Endocrine: Negative.   Genitourinary: Negative.   Musculoskeletal: Negative.   Skin: Negative.   Allergic/Immunologic: Negative.   Neurological: Positive for headaches.  Hematological: Negative.   Psychiatric/Behavioral: Negative.   All other systems reviewed and are negative.      Objective:   Physical Exam General: No acute distress HEENT: EOMI, oral membranes moist Cards: reg rate  Chest: normal effort Abdomen: Soft, NT, ND Skin: dry, intact Extremities: no edema  Neuro:  Attention is fair still inconsistent at times.  He remains a bit on the impulsive side but for most part is appropriate.. Motor 5/5.  Gait stable and improving Musculoskeletal:   Mild low back tenderness. Psych:  Very pleasant          Assessment & Plan:  1.  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  I did recommend that he have some sort of assistance in place to make sure he does not get lost or miss a flight. 2.  Pain Management:              HEP for low back.  4. Mood: continue celexa-            -improved, wife is supportive  as always 5. Neuropsych:             -ritalin 10mg  am and 5-10mg  at noon.    I provided him refills the other day.                  -Consider a late afternoon dose to help with fatigue during that part of the day.          -We will continue the controlled substance monitoring program, this consists of regular clinic visits, examinations, routine drug screening, pill counts as well as use of [place] Controlled Substance Reporting System. [place] was [place] today.    -UDS      6. Hx of right displaced sacral and right  pubic rami fracture: WBAT 7. ?Dysphagia/dysphonia: likely related to intubation, -seems improved  8. Visual changes: needs single vision lenses to drive.       -Ongoing diplopia.    per ophthalmology 9. Right clavicle fracture: ortho signed off 10.  Fatigue:         -trazodone for sleep helps.               -naps prn during day--should not exceed an hour  -Discussed trying to wean trazodone to see if he is able to sleep still and at the same time improving some of his daytime arousal.  I really do not think the trazodone dose is high enough to cause late day fatigue.   Fifteen minutes of face to face patient care time were spent during this visit. All questions were encouraged and answered.  Follow up with me in 6 mos .

## 2019-09-09 LAB — TOXASSURE SELECT,+ANTIDEPR,UR

## 2019-09-21 ENCOUNTER — Telehealth: Payer: Self-pay | Admitting: *Deleted

## 2019-09-21 NOTE — Telephone Encounter (Signed)
Urine drug screen for this encounter is consistent for prescribed medication 

## 2019-10-13 ENCOUNTER — Other Ambulatory Visit: Payer: Self-pay | Admitting: Physical Medicine & Rehabilitation

## 2019-10-13 DIAGNOSIS — F5101 Primary insomnia: Secondary | ICD-10-CM

## 2019-11-09 ENCOUNTER — Telehealth: Payer: Self-pay | Admitting: *Deleted

## 2019-11-09 DIAGNOSIS — S062X3S Diffuse traumatic brain injury with loss of consciousness of 1 hour to 5 hours 59 minutes, sequela: Secondary | ICD-10-CM

## 2019-11-09 NOTE — Telephone Encounter (Signed)
Genice Rouge called to request refill of Tyler Lane's methylphenidate. Per PMP last fill date was 09/24/19.

## 2019-11-10 MED ORDER — METHYLPHENIDATE HCL 10 MG PO TABS: 10.0000 mg | ORAL_TABLET | Freq: Two times a day (BID) | ORAL | 0 refills | Status: DC

## 2019-11-10 NOTE — Telephone Encounter (Signed)
Notified. 

## 2019-11-10 NOTE — Telephone Encounter (Signed)
meds refilled 

## 2020-01-16 ENCOUNTER — Emergency Department (HOSPITAL_COMMUNITY): Payer: No Typology Code available for payment source

## 2020-01-16 ENCOUNTER — Encounter (HOSPITAL_COMMUNITY): Payer: Self-pay

## 2020-01-16 ENCOUNTER — Other Ambulatory Visit: Payer: Self-pay

## 2020-01-16 ENCOUNTER — Emergency Department (HOSPITAL_COMMUNITY)
Admission: EM | Admit: 2020-01-16 | Discharge: 2020-01-16 | Disposition: A | Discharge status: Home / Self Care | Payer: No Typology Code available for payment source | Attending: Emergency Medicine | Admitting: Emergency Medicine

## 2020-01-16 DIAGNOSIS — Z87891 Personal history of nicotine dependence: Secondary | ICD-10-CM | POA: Insufficient documentation

## 2020-01-16 DIAGNOSIS — Y92410 Unspecified street and highway as the place of occurrence of the external cause: Secondary | ICD-10-CM | POA: Insufficient documentation

## 2020-01-16 DIAGNOSIS — S24109A Unspecified injury at unspecified level of thoracic spinal cord, initial encounter: Secondary | ICD-10-CM | POA: Diagnosis present

## 2020-01-16 DIAGNOSIS — S3992XA Unspecified injury of lower back, initial encounter: Secondary | ICD-10-CM

## 2020-01-16 DIAGNOSIS — I10 Essential (primary) hypertension: Secondary | ICD-10-CM | POA: Diagnosis not present

## 2020-01-16 DIAGNOSIS — S22088A Other fracture of T11-T12 vertebra, initial encounter for closed fracture: Secondary | ICD-10-CM | POA: Insufficient documentation

## 2020-01-16 DIAGNOSIS — Z7982 Long term (current) use of aspirin: Secondary | ICD-10-CM | POA: Diagnosis not present

## 2020-01-16 DIAGNOSIS — Z79899 Other long term (current) drug therapy: Secondary | ICD-10-CM | POA: Insufficient documentation

## 2020-01-16 DIAGNOSIS — Z23 Encounter for immunization: Secondary | ICD-10-CM | POA: Insufficient documentation

## 2020-01-16 HISTORY — DX: Unspecified injury of lower back, initial encounter: S39.92XA

## 2020-01-16 MED ORDER — TETANUS-DIPHTH-ACELL PERTUSSIS 5-2.5-18.5 LF-MCG/0.5 IM SUSY
0.5000 mL | PREFILLED_SYRINGE | Freq: Once | INTRAMUSCULAR | Status: AC
  Administered 2020-01-16: 0.5 mL via INTRAMUSCULAR
  Filled 2020-01-16: qty 0.5

## 2020-01-16 MED ORDER — HYDROCODONE-ACETAMINOPHEN 5-325 MG PO TABS
1.0000 | ORAL_TABLET | Freq: Once | ORAL | Status: AC
  Administered 2020-01-16: 1 via ORAL
  Filled 2020-01-16: qty 1

## 2020-01-16 MED ORDER — DIAZEPAM 2 MG PO TABS
2.0000 mg | ORAL_TABLET | Freq: Once | ORAL | Status: AC
  Administered 2020-01-16: 2 mg via ORAL
  Filled 2020-01-16: qty 1

## 2020-01-16 MED ORDER — DICLOFENAC SODIUM 1 % EX GEL
2.0000 g | Freq: Once | CUTANEOUS | Status: AC
  Administered 2020-01-16: 23:00:00 2 g via TOPICAL
  Filled 2020-01-16: qty 100

## 2020-01-16 MED ORDER — ONDANSETRON HCL 4 MG PO TABS
4.0000 mg | ORAL_TABLET | Freq: Once | ORAL | Status: AC
  Administered 2020-01-16: 4 mg via ORAL
  Filled 2020-01-16: qty 1

## 2020-01-16 MED ORDER — HYDROCODONE-ACETAMINOPHEN 5-325 MG PO TABS
1.0000 | ORAL_TABLET | Freq: Four times a day (QID) | ORAL | 0 refills | Status: DC | PRN
Start: 2020-01-16 — End: 2020-04-16

## 2020-01-16 MED ORDER — LIDOCAINE 5 % EX PTCH
1.0000 | MEDICATED_PATCH | Freq: Once | CUTANEOUS | Status: DC
  Administered 2020-01-16: 1 via TRANSDERMAL
  Filled 2020-01-16: qty 1

## 2020-01-16 NOTE — ED Notes (Signed)
Patient verbalizes understanding of discharge instructions. Opportunity for questioning and answers were provided. Armband removed by staff, pt discharged from ED via wheelchair.  

## 2020-01-16 NOTE — Discharge Instructions (Addendum)
The CT scan today shows you have a fracture of T12.  Call [facility] to schedule a follow up appointment in 4 weeks. Call the office first thing Monday Morning to schedule the appointment.    Wear the brace until you see Neurosurgery. You can take it off to bathe.   Prescription for pain medicine sent to the pharmacy. This is a narcotic so do not take it if you are going to be working or driving because it can make you drowsy.

## 2020-01-16 NOTE — Progress Notes (Signed)
Orthopedic Tech Progress Note Patient Details:  Tyler Lane [birth year] [id number] TLSO Brace has been applied  Patient ID: Tyler Lane, male   DOB: Dec 21, 1950, 69 y.o.   MRN: [id number]   Tyler Lane 01/16/2020, 9:58 PM

## 2020-01-16 NOTE — ED Notes (Signed)
Patient transported to X-ray 
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
## 2020-01-16 NOTE — ED Notes (Signed)
Pt complaining of pain in waiting room Pain @9 .
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
## 2020-01-18 ENCOUNTER — Telehealth: Payer: Self-pay | Admitting: *Deleted
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
## 2020-01-19 NOTE — Telephone Encounter (Signed)
Bonnie aware of Dr. Riley Kill reply. He has a follow up appointment in January.

## 2020-01-19 NOTE — Telephone Encounter (Signed)
I left a message for Kendal Hymen to call the office to discuss.
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
## 2020-01-21 ENCOUNTER — Other Ambulatory Visit: Payer: Self-pay

## 2020-01-21 DIAGNOSIS — S062X3S Diffuse traumatic brain injury with loss of consciousness of 1 hour to 5 hours 59 minutes, sequela: Secondary | ICD-10-CM

## 2020-01-21 NOTE — Telephone Encounter (Signed)
Per PMP : Last written on 11/10/2019, last filled on 12/10/19.  Thank you

## 2020-01-22 MED ORDER — METHYLPHENIDATE HCL 10 MG PO TABS: 10.0000 mg | ORAL_TABLET | Freq: Two times a day (BID) | ORAL | 0 refills | Status: DC

## 2020-03-02 ENCOUNTER — Other Ambulatory Visit: Payer: Self-pay

## 2020-03-02 ENCOUNTER — Encounter: Payer: Medicare Other | Attending: Physical Medicine & Rehabilitation | Admitting: Physical Medicine & Rehabilitation

## 2020-03-02 ENCOUNTER — Encounter: Payer: Self-pay | Admitting: Physical Medicine & Rehabilitation

## 2020-03-02 VITALS — BP 123/81 | HR 85 | Temp 99.2°F | Ht 71.0 in | Wt 171.0 lb

## 2020-03-02 DIAGNOSIS — S22000S Wedge compression fracture of unspecified thoracic vertebra, sequela: Secondary | ICD-10-CM | POA: Diagnosis not present

## 2020-03-02 DIAGNOSIS — S062X3S Diffuse traumatic brain injury with loss of consciousness of 1 hour to 5 hours 59 minutes, sequela: Secondary | ICD-10-CM | POA: Diagnosis present

## 2020-03-02 MED ORDER — METHYLPHENIDATE HCL 10 MG PO TABS
10.0000 mg | ORAL_TABLET | Freq: Two times a day (BID) | ORAL | 0 refills | Status: DC
Start: 2020-03-02 — End: 2020-04-12

## 2020-03-02 MED ORDER — METHOCARBAMOL 500 MG PO TABS: 500.0000 mg | ORAL_TABLET | Freq: Four times a day (QID) | ORAL | 1 refills | Status: DC | PRN
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
## 2020-03-15 ENCOUNTER — Other Ambulatory Visit: Payer: Self-pay | Admitting: Physical Medicine & Rehabilitation

## 2020-03-15 DIAGNOSIS — F5101 Primary insomnia: Secondary | ICD-10-CM

## 2020-03-19 ENCOUNTER — Other Ambulatory Visit: Payer: Self-pay

## 2020-03-19 ENCOUNTER — Ambulatory Visit
Admission: RE | Admit: 2020-03-19 | Discharge: 2020-03-19 | Disposition: A | Discharge status: Home / Self Care | Payer: Medicare Other | Source: Ambulatory Visit | Attending: Physical Medicine & Rehabilitation | Admitting: Physical Medicine & Rehabilitation

## 2020-03-19 DIAGNOSIS — S22000S Wedge compression fracture of unspecified thoracic vertebra, sequela: Secondary | ICD-10-CM

## 2020-03-19 DIAGNOSIS — S062X3S Diffuse traumatic brain injury with loss of consciousness of 1 hour to 5 hours 59 minutes, sequela: Secondary | ICD-10-CM

## 2020-04-01 ENCOUNTER — Other Ambulatory Visit: Payer: Self-pay | Admitting: Neurosurgery

## 2020-04-12 ENCOUNTER — Telehealth: Payer: Self-pay

## 2020-04-12 ENCOUNTER — Other Ambulatory Visit (HOSPITAL_COMMUNITY)
Admission: RE | Admit: 2020-04-12 | Discharge: 2020-04-12 | Disposition: A | Discharge status: Home / Self Care | Payer: Medicare Other | Source: Ambulatory Visit | Attending: Neurosurgery | Admitting: Neurosurgery

## 2020-04-12 DIAGNOSIS — Z20822 Contact with and (suspected) exposure to covid-19: Secondary | ICD-10-CM | POA: Diagnosis not present

## 2020-04-12 DIAGNOSIS — Z01812 Encounter for preprocedural laboratory examination: Secondary | ICD-10-CM | POA: Diagnosis present

## 2020-04-12 LAB — SARS CORONAVIRUS 2 (TAT 6-24 HRS): SARS Coronavirus 2: NEGATIVE

## 2020-04-12 MED ORDER — METHYLPHENIDATE HCL 10 MG PO TABS
10.0000 mg | ORAL_TABLET | Freq: Two times a day (BID) | ORAL | 0 refills | Status: DC
Start: 2020-04-12 — End: 2020-05-17

## 2020-04-12 NOTE — Telephone Encounter (Signed)
Refill request for Methylphenidate, next appt 05/04/20. Patient is completely out. [facility] [place].

## 2020-04-12 NOTE — Telephone Encounter (Signed)
rx written

## 2020-04-13 NOTE — Telephone Encounter (Signed)
Notified. 

## 2020-04-14 ENCOUNTER — Other Ambulatory Visit: Payer: Self-pay

## 2020-04-14 ENCOUNTER — Encounter (HOSPITAL_COMMUNITY): Payer: Self-pay | Admitting: Neurosurgery

## 2020-04-14 DIAGNOSIS — Z01812 Encounter for preprocedural laboratory examination: Secondary | ICD-10-CM | POA: Diagnosis not present

## 2020-04-14 NOTE — Progress Notes (Addendum)
Tyler Lane  denies chest pain or shortness of breath. Patient tested negative for Covid 04/13/20 and has been in quarantine since that time. I spoke with Tyler Lane, patient's significant other. Tyler Lane prepares Tyler Lane medications and knows when he stopped medications.  Ms Harrietta Lane will be with Tyler Lane to assist with questions in the Pre- Op area.

## 2020-04-15 ENCOUNTER — Encounter (HOSPITAL_COMMUNITY): Payer: Self-pay | Admitting: Neurosurgery

## 2020-04-15 ENCOUNTER — Ambulatory Visit (HOSPITAL_COMMUNITY): Payer: Medicare Other

## 2020-04-15 ENCOUNTER — Encounter (HOSPITAL_COMMUNITY)
Admission: RE | Disposition: A | Discharge status: Home / Self Care | Payer: Self-pay | Source: Home / Self Care | Attending: Neurosurgery

## 2020-04-15 ENCOUNTER — Ambulatory Visit (HOSPITAL_COMMUNITY): Payer: Medicare Other | Admitting: Anesthesiology

## 2020-04-15 ENCOUNTER — Observation Stay (HOSPITAL_COMMUNITY)
Admission: RE | Admit: 2020-04-15 | Discharge: 2020-04-16 | Disposition: A | Discharge status: Home / Self Care | Payer: Medicare Other | Attending: Neurosurgery | Admitting: Neurosurgery

## 2020-04-15 ENCOUNTER — Other Ambulatory Visit: Payer: Self-pay

## 2020-04-15 DIAGNOSIS — M4854XA Collapsed vertebra, not elsewhere classified, thoracic region, initial encounter for fracture: Principal | ICD-10-CM | POA: Insufficient documentation

## 2020-04-15 DIAGNOSIS — S22080A Wedge compression fracture of T11-T12 vertebra, initial encounter for closed fracture: Secondary | ICD-10-CM | POA: Diagnosis present

## 2020-04-15 DIAGNOSIS — Y92410 Unspecified street and highway as the place of occurrence of the external cause: Secondary | ICD-10-CM | POA: Insufficient documentation

## 2020-04-15 DIAGNOSIS — Z419 Encounter for procedure for purposes other than remedying health state, unspecified: Secondary | ICD-10-CM

## 2020-04-15 DIAGNOSIS — R2689 Other abnormalities of gait and mobility: Secondary | ICD-10-CM | POA: Diagnosis not present

## 2020-04-15 DIAGNOSIS — S22009A Unspecified fracture of unspecified thoracic vertebra, initial encounter for closed fracture: Secondary | ICD-10-CM | POA: Diagnosis present

## 2020-04-15 HISTORY — PX: KYPHOPLASTY: SHX5884

## 2020-04-15 HISTORY — DX: Pneumonia, unspecified organism: J18.9

## 2020-04-15 HISTORY — DX: Inflammatory liver disease, unspecified: K75.9

## 2020-04-15 LAB — COMPREHENSIVE METABOLIC PANEL
ALT: 28 U/L (ref 0–44)
AST: 27 U/L (ref 15–41)
Albumin: 4.2 g/dL (ref 3.5–5.0)
Alkaline Phosphatase: 95 U/L (ref 38–126)
Anion gap: 9 (ref 5–15)
BUN: 15 mg/dL (ref 8–23)
CO2: 27 mmol/L (ref 22–32)
Calcium: 9.3 mg/dL (ref 8.9–10.3)
Chloride: 99 mmol/L (ref 98–111)
Creatinine, Ser: 1.17 mg/dL (ref 0.61–1.24)
GFR, Estimated: >60 mL/min (ref >60)
Glucose, Bld: 104 mg/dL — ABNORMAL HIGH (ref 70–99)
Potassium: 3.5 mmol/L (ref 3.5–5.1)
Sodium: 135 mmol/L (ref 135–145)
Total Bilirubin: 0.6 mg/dL (ref 0.3–1.2)
Total Protein: 6.6 g/dL (ref 6.5–8.1)

## 2020-04-15 LAB — CBC
HCT: 47.5 % (ref 39.0–52.0)
Hemoglobin: 16.4 g/dL (ref 13.0–17.0)
MCH: 32 pg (ref 26.0–34.0)
MCHC: 34.5 g/dL (ref 30.0–36.0)
MCV: 92.6 fL (ref 80.0–100.0)
Platelets: 232 10*3/uL (ref 150–400)
RBC: 5.13 MIL/uL (ref 4.22–5.81)
RDW: 13.6 % (ref 11.5–15.5)
WBC: 7.6 10*3/uL (ref 4.0–10.5)
nRBC: 0 % (ref 0.0–0.2)

## 2020-04-15 SURGERY — KYPHOPLASTY
Anesthesia: General | Site: Back

## 2020-04-15 MED ORDER — FENTANYL CITRATE (PF) 250 MCG/5ML IJ SOLN
INTRAMUSCULAR | Status: DC | PRN
  Administered 2020-04-15 (×2): 50 ug via INTRAVENOUS

## 2020-04-15 MED ORDER — ACETAMINOPHEN 10 MG/ML IV SOLN: 1000.0000 mg | Freq: Once | INTRAVENOUS | Status: DC | PRN

## 2020-04-15 MED ORDER — OXYCODONE HCL 5 MG PO TABS
5.0000 mg | ORAL_TABLET | ORAL | Status: DC | PRN
  Administered 2020-04-16: 5 mg via ORAL
  Filled 2020-04-15: qty 1

## 2020-04-15 MED ORDER — CEFAZOLIN SODIUM-DEXTROSE 2-4 GM/100ML-% IV SOLN
2.0000 g | INTRAVENOUS | Status: AC
  Administered 2020-04-15: 2 g via INTRAVENOUS
  Filled 2020-04-15: qty 100

## 2020-04-15 MED ORDER — KCL IN DEXTROSE-NACL 20-5-0.45 MEQ/L-%-% IV SOLN: INTRAVENOUS | Status: DC

## 2020-04-15 MED ORDER — BUPIVACAINE HCL (PF) 0.5 % IJ SOLN
INTRAMUSCULAR | Status: DC | PRN
  Administered 2020-04-15: 3 mL

## 2020-04-15 MED ORDER — PHENOL 1.4 % MT LIQD: 1.0000 | OROMUCOSAL | Status: DC | PRN

## 2020-04-15 MED ORDER — LIDOCAINE 2% (20 MG/ML) 5 ML SYRINGE
INTRAMUSCULAR | Status: AC
  Filled 2020-04-15: qty 5

## 2020-04-15 MED ORDER — PANTOPRAZOLE SODIUM 40 MG PO TBEC: 40.0000 mg | DELAYED_RELEASE_TABLET | Freq: Every day | ORAL | Status: DC

## 2020-04-15 MED ORDER — PANTOPRAZOLE SODIUM 40 MG IV SOLR
40.0000 mg | Freq: Every day | INTRAVENOUS | Status: DC
  Administered 2020-04-15: 40 mg via INTRAVENOUS
  Filled 2020-04-15: qty 40

## 2020-04-15 MED ORDER — FENTANYL CITRATE (PF) 100 MCG/2ML IJ SOLN
25.0000 ug | INTRAMUSCULAR | Status: DC | PRN
Start: 2020-04-15 — End: 2020-04-15

## 2020-04-15 MED ORDER — OXYCODONE HCL 5 MG/5ML PO SOLN: 5.0000 mg | Freq: Once | ORAL | Status: DC | PRN

## 2020-04-15 MED ORDER — DEXAMETHASONE SODIUM PHOSPHATE 10 MG/ML IJ SOLN
INTRAMUSCULAR | Status: AC
  Filled 2020-04-15: qty 1

## 2020-04-15 MED ORDER — 0.9 % SODIUM CHLORIDE (POUR BTL) OPTIME
TOPICAL | Status: DC | PRN
  Administered 2020-04-15: 1000 mL

## 2020-04-15 MED ORDER — MENTHOL 3 MG MT LOZG: 1.0000 | LOZENGE | OROMUCOSAL | Status: DC | PRN

## 2020-04-15 MED ORDER — SENNOSIDES-DOCUSATE SODIUM 8.6-50 MG PO TABS: 2.0000 | ORAL_TABLET | Freq: Every day | ORAL | Status: DC | PRN

## 2020-04-15 MED ORDER — ONDANSETRON HCL 4 MG/2ML IJ SOLN
INTRAMUSCULAR | Status: AC
  Filled 2020-04-15: qty 2

## 2020-04-15 MED ORDER — MIDAZOLAM HCL 2 MG/2ML IJ SOLN
INTRAMUSCULAR | Status: DC | PRN
  Administered 2020-04-15 (×2): 1 mg via INTRAVENOUS

## 2020-04-15 MED ORDER — METHOCARBAMOL 500 MG PO TABS
500.0000 mg | ORAL_TABLET | Freq: Four times a day (QID) | ORAL | Status: DC | PRN
  Administered 2020-04-15 – 2020-04-16 (×3): 500 mg via ORAL
  Filled 2020-04-15 (×3): qty 1

## 2020-04-15 MED ORDER — HYDROCODONE-ACETAMINOPHEN 5-325 MG PO TABS: 1.0000 | ORAL_TABLET | Freq: Four times a day (QID) | ORAL | Status: DC | PRN

## 2020-04-15 MED ORDER — DICLOFENAC SODIUM 1 % TD GEL
2.0000 g | Freq: Four times a day (QID) | TRANSDERMAL | Status: DC | PRN
  Filled 2020-04-15: qty 100

## 2020-04-15 MED ORDER — MIDAZOLAM HCL 2 MG/2ML IJ SOLN
INTRAMUSCULAR | Status: AC
  Filled 2020-04-15: qty 2

## 2020-04-15 MED ORDER — PROPOFOL 10 MG/ML IV BOLUS
INTRAVENOUS | Status: DC | PRN
  Administered 2020-04-15: 50 mg via INTRAVENOUS
  Administered 2020-04-15: 20 mg via INTRAVENOUS
  Administered 2020-04-15: 100 mg via INTRAVENOUS
  Administered 2020-04-15: 40 mg via INTRAVENOUS

## 2020-04-15 MED ORDER — LACTATED RINGERS IV SOLN: INTRAVENOUS | Status: DC

## 2020-04-15 MED ORDER — DICLOFENAC SODIUM 1 % EX GEL
2.0000 g | Freq: Four times a day (QID) | CUTANEOUS | Status: DC | PRN
  Filled 2020-04-15: qty 100

## 2020-04-15 MED ORDER — PRAVASTATIN SODIUM 10 MG PO TABS
20.0000 mg | ORAL_TABLET | Freq: Every day | ORAL | Status: DC
  Administered 2020-04-15: 20 mg via ORAL
  Filled 2020-04-15: qty 2

## 2020-04-15 MED ORDER — SODIUM CHLORIDE 0.9 % IV SOLN: 250.0000 mL | INTRAVENOUS | Status: DC

## 2020-04-15 MED ORDER — HYDROCHLOROTHIAZIDE 25 MG PO TABS
12.5000 mg | ORAL_TABLET | Freq: Every day | ORAL | Status: DC
  Administered 2020-04-15 – 2020-04-16 (×2): 12.5 mg via ORAL
  Filled 2020-04-15 (×2): qty 1

## 2020-04-15 MED ORDER — ZOLPIDEM TARTRATE 5 MG PO TABS: 5.0000 mg | ORAL_TABLET | Freq: Every evening | ORAL | Status: DC | PRN

## 2020-04-15 MED ORDER — ONDANSETRON HCL 4 MG/2ML IJ SOLN: 4.0000 mg | Freq: Four times a day (QID) | INTRAMUSCULAR | Status: DC | PRN

## 2020-04-15 MED ORDER — ACETAMINOPHEN 160 MG/5ML PO SOLN: 1000.0000 mg | Freq: Once | ORAL | Status: DC | PRN

## 2020-04-15 MED ORDER — CHLORHEXIDINE GLUCONATE CLOTH 2 % EX PADS: 6.0000 | MEDICATED_PAD | Freq: Once | CUTANEOUS | Status: DC

## 2020-04-15 MED ORDER — ORAL CARE MOUTH RINSE: 15.0000 mL | Freq: Once | OROMUCOSAL | Status: AC

## 2020-04-15 MED ORDER — ACETAMINOPHEN 650 MG RE SUPP: 650.0000 mg | RECTAL | Status: DC | PRN

## 2020-04-15 MED ORDER — LORATADINE 10 MG PO TABS
10.0000 mg | ORAL_TABLET | Freq: Every day | ORAL | Status: DC
  Administered 2020-04-16: 10 mg via ORAL
  Filled 2020-04-15: qty 1

## 2020-04-15 MED ORDER — DEXAMETHASONE SODIUM PHOSPHATE 10 MG/ML IJ SOLN
INTRAMUSCULAR | Status: DC | PRN
  Administered 2020-04-15: 10 mg via INTRAVENOUS

## 2020-04-15 MED ORDER — CEFAZOLIN SODIUM-DEXTROSE 2-4 GM/100ML-% IV SOLN
2.0000 g | Freq: Three times a day (TID) | INTRAVENOUS | Status: AC
  Administered 2020-04-15 – 2020-04-16 (×2): 2 g via INTRAVENOUS
  Filled 2020-04-15 (×2): qty 100

## 2020-04-15 MED ORDER — POLYETHYLENE GLYCOL 3350 17 G PO PACK: 17.0000 g | PACK | Freq: Every day | ORAL | Status: DC | PRN

## 2020-04-15 MED ORDER — METHOCARBAMOL 500 MG PO TABS: 500.0000 mg | ORAL_TABLET | Freq: Four times a day (QID) | ORAL | Status: DC | PRN

## 2020-04-15 MED ORDER — TRAZODONE HCL 50 MG PO TABS
50.0000 mg | ORAL_TABLET | Freq: Every day | ORAL | Status: DC
  Administered 2020-04-15: 50 mg via ORAL
  Filled 2020-04-15 (×2): qty 1

## 2020-04-15 MED ORDER — LIDOCAINE 2% (20 MG/ML) 5 ML SYRINGE
INTRAMUSCULAR | Status: DC | PRN
  Administered 2020-04-15: 60 mg via INTRAVENOUS

## 2020-04-15 MED ORDER — FENTANYL CITRATE (PF) 250 MCG/5ML IJ SOLN
INTRAMUSCULAR | Status: AC
  Filled 2020-04-15: qty 5

## 2020-04-15 MED ORDER — HYDROCERIN EX CREA
1.0000 "application " | TOPICAL_CREAM | CUTANEOUS | Status: DC | PRN
  Filled 2020-04-15: qty 113

## 2020-04-15 MED ORDER — SODIUM CHLORIDE 0.9% FLUSH: 3.0000 mL | INTRAVENOUS | Status: DC | PRN

## 2020-04-15 MED ORDER — LIDOCAINE-EPINEPHRINE 1 %-1:100000 IJ SOLN
INTRAMUSCULAR | Status: DC | PRN
  Administered 2020-04-15: 3 mL

## 2020-04-15 MED ORDER — HYDROCODONE-ACETAMINOPHEN 5-325 MG PO TABS
1.0000 | ORAL_TABLET | ORAL | Status: DC | PRN
Start: 2020-04-15 — End: 2020-04-16
  Administered 2020-04-15 (×2): 2 via ORAL
  Filled 2020-04-15 (×3): qty 2

## 2020-04-15 MED ORDER — ONDANSETRON HCL 4 MG PO TABS: 4.0000 mg | ORAL_TABLET | Freq: Four times a day (QID) | ORAL | Status: DC | PRN

## 2020-04-15 MED ORDER — CITALOPRAM HYDROBROMIDE 20 MG PO TABS
40.0000 mg | ORAL_TABLET | Freq: Every day | ORAL | Status: DC
  Administered 2020-04-15 – 2020-04-16 (×2): 40 mg via ORAL
  Filled 2020-04-15 (×2): qty 2

## 2020-04-15 MED ORDER — DOCUSATE SODIUM 100 MG PO CAPS
100.0000 mg | ORAL_CAPSULE | Freq: Two times a day (BID) | ORAL | Status: DC
  Administered 2020-04-15 – 2020-04-16 (×3): 100 mg via ORAL
  Filled 2020-04-15 (×3): qty 1

## 2020-04-15 MED ORDER — SUGAMMADEX SODIUM 200 MG/2ML IV SOLN
INTRAVENOUS | Status: DC | PRN
  Administered 2020-04-15: 200 mg via INTRAVENOUS

## 2020-04-15 MED ORDER — ALUM & MAG HYDROXIDE-SIMETH 200-200-20 MG/5ML PO SUSP: 30.0000 mL | Freq: Four times a day (QID) | ORAL | Status: DC | PRN

## 2020-04-15 MED ORDER — SODIUM CHLORIDE 0.9% FLUSH: 3.0000 mL | Freq: Two times a day (BID) | INTRAVENOUS | Status: DC

## 2020-04-15 MED ORDER — METHOCARBAMOL 1000 MG/10ML IJ SOLN
500.0000 mg | Freq: Four times a day (QID) | INTRAVENOUS | Status: DC | PRN
  Filled 2020-04-15: qty 5

## 2020-04-15 MED ORDER — OXYCODONE HCL 5 MG PO TABS: 5.0000 mg | ORAL_TABLET | Freq: Once | ORAL | Status: DC | PRN

## 2020-04-15 MED ORDER — FLEET ENEMA 7-19 GM/118ML RE ENEM: 1.0000 | ENEMA | Freq: Once | RECTAL | Status: DC | PRN

## 2020-04-15 MED ORDER — BISACODYL 10 MG RE SUPP: 10.0000 mg | Freq: Every day | RECTAL | Status: DC | PRN

## 2020-04-15 MED ORDER — PROPOFOL 10 MG/ML IV BOLUS
INTRAVENOUS | Status: AC
  Filled 2020-04-15: qty 40

## 2020-04-15 MED ORDER — LIDOCAINE-EPINEPHRINE 1 %-1:100000 IJ SOLN
INTRAMUSCULAR | Status: AC
  Filled 2020-04-15: qty 1

## 2020-04-15 MED ORDER — ONDANSETRON HCL 4 MG/2ML IJ SOLN
INTRAMUSCULAR | Status: DC | PRN
  Administered 2020-04-15: 4 mg via INTRAVENOUS

## 2020-04-15 MED ORDER — CHLORHEXIDINE GLUCONATE 0.12 % MT SOLN
15.0000 mL | Freq: Once | OROMUCOSAL | Status: AC
  Administered 2020-04-15: 15 mL via OROMUCOSAL
  Filled 2020-04-15: qty 15

## 2020-04-15 MED ORDER — ROCURONIUM BROMIDE 10 MG/ML (PF) SYRINGE
PREFILLED_SYRINGE | INTRAVENOUS | Status: AC
  Filled 2020-04-15: qty 10

## 2020-04-15 MED ORDER — ROCURONIUM BROMIDE 10 MG/ML (PF) SYRINGE
PREFILLED_SYRINGE | INTRAVENOUS | Status: DC | PRN
  Administered 2020-04-15: 60 mg via INTRAVENOUS

## 2020-04-15 MED ORDER — BUPIVACAINE HCL (PF) 0.5 % IJ SOLN
INTRAMUSCULAR | Status: AC
  Filled 2020-04-15: qty 30

## 2020-04-15 MED ORDER — ACETAMINOPHEN 325 MG PO TABS
650.0000 mg | ORAL_TABLET | ORAL | Status: DC | PRN
Start: 2020-04-15 — End: 2020-04-16

## 2020-04-15 MED ORDER — ACETAMINOPHEN 500 MG PO TABS: 1000.0000 mg | ORAL_TABLET | Freq: Once | ORAL | Status: DC | PRN

## 2020-04-15 MED ORDER — ASPIRIN EC 81 MG PO TBEC
81.0000 mg | DELAYED_RELEASE_TABLET | Freq: Every day | ORAL | Status: DC
  Administered 2020-04-15 – 2020-04-16 (×2): 81 mg via ORAL
  Filled 2020-04-15 (×2): qty 1

## 2020-04-15 MED ORDER — HYDROMORPHONE HCL 1 MG/ML IJ SOLN: 0.5000 mg | INTRAMUSCULAR | Status: DC | PRN

## 2020-04-15 MED ORDER — METHYLPHENIDATE HCL 5 MG PO TABS
10.0000 mg | ORAL_TABLET | Freq: Two times a day (BID) | ORAL | Status: DC
  Administered 2020-04-15 – 2020-04-16 (×2): 10 mg via ORAL
  Filled 2020-04-15 (×2): qty 2

## 2020-04-15 MED ORDER — IOPAMIDOL (ISOVUE-300) INJECTION 61%
INTRAVENOUS | Status: DC | PRN
  Administered 2020-04-15: 50 mL

## 2020-04-15 SURGICAL SUPPLY — 41 items
BLADE CLIPPER SURG (BLADE) ×2 IMPLANT
BLADE SURG 15 STRL LF DISP TIS (BLADE) ×1 IMPLANT
BLADE SURG 15 STRL SS (BLADE) ×1
CARTRIDGE OIL MAESTRO DRILL (MISCELLANEOUS) IMPLANT
CEMENT KYPHON CX01A KIT/MIXER (Cement) ×2 IMPLANT
COVER WAND RF STERILE (DRAPES) IMPLANT
DECANTER SPIKE VIAL GLASS SM (MISCELLANEOUS) IMPLANT
DERMABOND ADVANCED (GAUZE/BANDAGES/DRESSINGS) ×1
DERMABOND ADVANCED .7 DNX12 (GAUZE/BANDAGES/DRESSINGS) ×1 IMPLANT
DIFFUSER DRILL AIR PNEUMATIC (MISCELLANEOUS) IMPLANT
DRAPE C-ARM 42X72 X-RAY (DRAPES) ×2 IMPLANT
DRAPE HALF SHEET 40X57 (DRAPES) ×2 IMPLANT
DRAPE LAPAROTOMY 100X72X124 (DRAPES) ×2 IMPLANT
DRAPE SURG 17X23 STRL (DRAPES) ×2 IMPLANT
DRAPE WARM FLUID 44X44 (DRAPES) IMPLANT
DURAPREP 26ML APPLICATOR (WOUND CARE) ×2 IMPLANT
GAUZE 4X4 16PLY RFD (DISPOSABLE) ×2 IMPLANT
GLOVE BIO SURGEON STRL SZ8 (GLOVE) ×2 IMPLANT
GLOVE BIOGEL PI IND STRL 8.5 (GLOVE) ×1 IMPLANT
GLOVE BIOGEL PI INDICATOR 8.5 (GLOVE) ×1
GLOVE EXAM NITRILE XL STR (GLOVE) IMPLANT
GLOVE SURG UNDER POLY LF SZ6.5 (GLOVE) ×4 IMPLANT
GOWN STRL REUS W/ TWL LRG LVL3 (GOWN DISPOSABLE) ×2 IMPLANT
GOWN STRL REUS W/ TWL XL LVL3 (GOWN DISPOSABLE) ×1 IMPLANT
GOWN STRL REUS W/TWL 2XL LVL3 (GOWN DISPOSABLE) ×2 IMPLANT
GOWN STRL REUS W/TWL LRG LVL3 (GOWN DISPOSABLE) ×2
GOWN STRL REUS W/TWL XL LVL3 (GOWN DISPOSABLE) ×1
KIT BASIN OR (CUSTOM PROCEDURE TRAY) ×2 IMPLANT
KIT TURNOVER KIT B (KITS) ×2 IMPLANT
NEEDLE HYPO 25X1 1.5 SAFETY (NEEDLE) ×2 IMPLANT
NS IRRIG 1000ML POUR BTL (IV SOLUTION) ×2 IMPLANT
OIL CARTRIDGE MAESTRO DRILL (MISCELLANEOUS)
PACK SURGICAL SETUP 50X90 (CUSTOM PROCEDURE TRAY) ×2 IMPLANT
PAD ARMBOARD 7.5X6 YLW CONV (MISCELLANEOUS) ×6 IMPLANT
STAPLER SKIN PROX WIDE 3.9 (STAPLE) ×2 IMPLANT
SUT VIC AB 3-0 SH 8-18 (SUTURE) ×2 IMPLANT
SYR CONTROL 10ML LL (SYRINGE) ×2 IMPLANT
TOWEL GREEN STERILE (TOWEL DISPOSABLE) ×2 IMPLANT
TOWEL GREEN STERILE FF (TOWEL DISPOSABLE) IMPLANT
TRAY KYPHOPAK 15/2 EXPRESS CDS (KITS) ×2 IMPLANT
TRAY KYPHOPAK 20/3 ONESTEP 1ST (MISCELLANEOUS) IMPLANT

## 2020-04-15 NOTE — Transfer of Care (Signed)
Immediate Anesthesia Transfer of Care Note  Patient: Tyler Lane  Procedure(s) Performed: KYPHOPLASTY Thoracic twelve (N/A Back)  Patient Location: PACU  Anesthesia Type:General  Level of Consciousness: awake, drowsy and patient cooperative  Airway & Oxygen Therapy: Patient Spontanous Breathing  Post-op Assessment: Report given to RN, Post -op Vital signs reviewed and stable and Patient moving all extremities  Post vital signs: Reviewed and stable  Last Vitals:  Vitals Value Taken Time  BP 124/55 04/15/20 0942  Temp 37.2 C 04/15/20 0942  Pulse 73 04/15/20 0944  Resp 24 04/15/20 0942  SpO2 93 % 04/15/20 0944  Vitals shown include unvalidated device data.  Last Pain:  Vitals:   04/15/20 0718  TempSrc:   PainSc: 0-No pain         Complications: No complications documented.

## 2020-04-15 NOTE — Brief Op Note (Signed)
04/15/2020  9:41 AM  PATIENT:  Tyler Lane  69 y.o. male  PRE-OPERATIVE DIAGNOSIS:  THORACIC TWELVE COMPRESSION FRACTURE  POST-OPERATIVE DIAGNOSIS:  THORACIC TWELVE COMPRESSION FRACTURE  PROCEDURE:  Procedure(s): KYPHOPLASTY Thoracic twelve (N/A)  SURGEON:  Surgeon(s) and Role:    * Cleon Thoma, MD - Primary  PHYSICIAN ASSISTANT:   ASSISTANTS: none   ANESTHESIA:   general  EBL:  Minimal  BLOOD ADMINISTERED:none  DRAINS: none   LOCAL MEDICATIONS USED:  MARCAINE    and LIDOCAINE   SPECIMEN:  No Specimen  DISPOSITION OF SPECIMEN:  N/A  COUNTS:  YES  TOURNIQUET:  * No tourniquets in log *  DICTATION: Patient is 69 year old man with a painful and progressively worsening T 12 compression fracture, which is proving debilitatingly painful to him.  It was elected to take him to surgery for kyphoplasty procedure.  PROCEDURE:  Following the smooth and uncomplicated induction of general endotracheal anesthesia, the patient was placed in a prone position on chest rolls.  C-arm fluoroscopy was positioned in both the AP and lateral planes, centered on the T 12 vertebra. This had become severely collapsed.  His back was prepped and draped in the usual sterile fashion with Duraprep.  Using a right uni-pedicular approach, the right T 12 pedicle and vertebral body were entered with the trochar using standard landmarks.  The drill was used, followed by a 15 cc Kyphon balloon, which was used to re-expand the broken vertebra.  Subsequently, 4 cc of bone cement was placed into the void created by the balloon and was seen to fill the fracture cleft and fill the vertebra in both the AP and lateral direction with good interdigitation and with minimal extravasation. The bone void filler was then removed.  Final X-ray demonstrated good filling within the fractured vertebra.  The incision was closed with a single 3-0 vicryl stitch and dressed with Dermabond. The patient was returned to the OR gurney  and extubated in the OR and taken to Recovery in stable and satisfactory condition, having tolerated the procedure well.  Counts were correct at the end of the case.   PLAN OF CARE: Admit for overnight observation  PATIENT DISPOSITION:  PACU - hemodynamically stable.   Delay start of Pharmacological VTE agent (>24hrs) due to surgical blood loss or risk of bleeding: yes  

## 2020-04-15 NOTE — Anesthesia Preprocedure Evaluation (Addendum)
Anesthesia Evaluation  Patient identified by MRN, date of birth, ID band Patient awake    Reviewed: Allergy & Precautions, NPO status , Patient's Chart, lab work & pertinent test results  History of Anesthesia Complications Negative for: history of anesthetic complications  Airway Mallampati: III  TM Distance: >3 FB Neck ROM: Full    Dental  (+) Dental Advisory Given   Pulmonary neg shortness of breath, neg sleep apnea, neg COPD, neg recent URI, former smoker,  Covid-19 Nucleic Acid Test Results Lab Results      Component                Value               Date                      SARSCOV2NAA              NEGATIVE            04/12/2020              breath sounds clear to auscultation       Cardiovascular hypertension, Pt. on medications  Rhythm:Regular     Neuro/Psych neg Seizures PSYCHIATRIC DISORDERS Depression    GI/Hepatic negative GI ROS,   Endo/Other    Renal/GU Lab Results      Component                Value               Date                      CREATININE               1.17                04/15/2020           Lab Results      Component                Value               Date                      K                        3.5                 04/15/2020                 Musculoskeletal   Abdominal   Peds  Hematology   Anesthesia Other Findings   Reproductive/Obstetrics                            Anesthesia Physical Anesthesia Plan  ASA: II  Anesthesia Plan: General   Post-op Pain Management:    Induction: Intravenous  PONV Risk Score and Plan: 2 and Ondansetron and Dexamethasone  Airway Management Planned: Oral ETT  Additional Equipment: None  Intra-op Plan:   Post-operative Plan: Extubation in OR  Informed Consent: I have reviewed the patients History and Physical, chart, labs and discussed the procedure including the risks, benefits and alternatives for the  proposed anesthesia with the patient or authorized representative who has indicated his/her understanding and acceptance.     Dental advisory given  Plan Discussed with: CRNA and Surgeon  Anesthesia Plan Comments:         Anesthesia Quick Evaluation

## 2020-04-15 NOTE — Interval H&P Note (Signed)
History and Physical Interval Note:  04/15/2020 8:29 AM  Tyler Lane  has presented today for surgery, with the diagnosis of THORACIC 12 COMPRESSION FRACTURE.  The various methods of treatment have been discussed with the patient and family. After consideration of risks, benefits and other options for treatment, the patient has consented to  Procedure(s) with comments: KYPHOPLASTY T12 (N/A) - 3C as a surgical intervention.  The patient's history has been reviewed, patient examined, no change in status, stable for surgery.  I have reviewed the patient's chart and labs.  Questions were answered to the patient's satisfaction.     Dorian Heckle

## 2020-04-15 NOTE — Op Note (Signed)
04/15/2020  9:41 AM  PATIENT:  Tyler Lane  70 y.o. male  PRE-OPERATIVE DIAGNOSIS:  THORACIC TWELVE COMPRESSION FRACTURE  POST-OPERATIVE DIAGNOSIS:  THORACIC TWELVE COMPRESSION FRACTURE  PROCEDURE:  Procedure(s): KYPHOPLASTY Thoracic twelve (N/A)  SURGEON:  Surgeon(s) and Role:    * Maeola Harman, MD - Primary  PHYSICIAN ASSISTANT:   ASSISTANTS: none   ANESTHESIA:   general  EBL:  Minimal  BLOOD ADMINISTERED:none  DRAINS: none   LOCAL MEDICATIONS USED:  MARCAINE    and LIDOCAINE   SPECIMEN:  No Specimen  DISPOSITION OF SPECIMEN:  N/A  COUNTS:  YES  TOURNIQUET:  * No tourniquets in log *  DICTATION: Patient is 70 year old man with a painful and progressively worsening T 12 compression fracture, which is proving debilitatingly painful to him.  It was elected to take him to surgery for kyphoplasty procedure.  PROCEDURE:  Following the smooth and uncomplicated induction of general endotracheal anesthesia, the patient was placed in a prone position on chest rolls.  C-arm fluoroscopy was positioned in both the AP and lateral planes, centered on the T 12 vertebra. This had become severely collapsed.  His back was prepped and draped in the usual sterile fashion with Duraprep.  Using a right uni-pedicular approach, the right T 12 pedicle and vertebral body were entered with the trochar using standard landmarks.  The drill was used, followed by a 15 cc Kyphon balloon, which was used to re-expand the broken vertebra.  Subsequently, 4 cc of bone cement was placed into the void created by the balloon and was seen to fill the fracture cleft and fill the vertebra in both the AP and lateral direction with good interdigitation and with minimal extravasation. The bone void filler was then removed.  Final X-ray demonstrated good filling within the fractured vertebra.  The incision was closed with a single 3-0 vicryl stitch and dressed with Dermabond. The patient was returned to the OR gurney  and extubated in the OR and taken to Recovery in stable and satisfactory condition, having tolerated the procedure well.  Counts were correct at the end of the case.   PLAN OF CARE: Admit for overnight observation  PATIENT DISPOSITION:  PACU - hemodynamically stable.   Delay start of Pharmacological VTE agent (>24hrs) due to surgical blood loss or risk of bleeding: yes

## 2020-04-15 NOTE — Anesthesia Procedure Notes (Signed)
Procedure Name: Intubation Date/Time: 04/15/2020 8:51 AM Performed by: Rande Brunt, CRNA Pre-anesthesia Checklist: Patient identified, Emergency Drugs available, Suction available and Patient being monitored Patient Re-evaluated:Patient Re-evaluated prior to induction Oxygen Delivery Method: Circle System Utilized Preoxygenation: Pre-oxygenation with 100% oxygen Induction Type: IV induction Ventilation: Mask ventilation without difficulty Laryngoscope Size: Mac and 4 Grade View: Grade I Tube type: Oral Tube size: 7.5 mm Number of attempts: 1 Airway Equipment and Method: Stylet and Oral airway Placement Confirmation: ETT inserted through vocal cords under direct vision,  positive ETCO2 and breath sounds checked- equal and bilateral Secured at: 23 cm Tube secured with: Tape Dental Injury: Teeth and Oropharynx as per pre-operative assessment  Comments: Attempted DVL x2 by EMT student with miller 3 and MAC 4. Grade I view with MAC 4 and ETT placed by CRNA
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
## 2020-04-16 DIAGNOSIS — S22080A Wedge compression fracture of T11-T12 vertebra, initial encounter for closed fracture: Secondary | ICD-10-CM | POA: Diagnosis not present

## 2020-04-16 DIAGNOSIS — M4854XA Collapsed vertebra, not elsewhere classified, thoracic region, initial encounter for fracture: Secondary | ICD-10-CM | POA: Diagnosis not present

## 2020-04-16 MED ORDER — HYDROCODONE-ACETAMINOPHEN 5-325 MG PO TABS: 1.0000 | ORAL_TABLET | Freq: Four times a day (QID) | ORAL | 0 refills | Status: DC | PRN

## 2020-04-16 NOTE — Discharge Instructions (Signed)

## 2020-04-16 NOTE — Progress Notes (Signed)
Patient is discharged from room 3C02 at this time. Alert and in stable condition. IV site d/c'd and instructions read to patient and spouse with understanding verbalized and all questions answered. Left unit via wheelchair with all belongings at side.  ?
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
## 2020-04-16 NOTE — Discharge Summary (Signed)
Physician Discharge Summary  Patient ID: Tyler Lane MRN: [id number] DOB/AGE: 08-31-1950 70 y.o.  Admit date: 04/15/2020 Discharge date: 04/16/2020  Admission Diagnoses:  T12 compression fracture  Discharge Diagnoses:  Same Active Problems:   Thoracic vertebral fracture [facility])   Discharged Condition: Stable  Hospital Course:  Tyler Lane is a 70 y.o. male admitted after elective Kyphoplasty. He was reporting imrovement in back pain, ambulating well, tolerating diet and voiding normally. He was therefore discharged in stable condition.  Treatments: Surgery - T12 kyphoplasty  Discharge Exam: Blood pressure 118/78, pulse 77, temperature 97.9 F (36.6 C), temperature source Oral, resp. rate 18, height 6' (1.829 m), weight 78.9 kg, SpO2 96 %. Awake, alert, oriented Speech fluent, appropriate CN grossly intact 5/5 BUE/BLE Wound c/d/i  Disposition: Discharge disposition: 01-Home or Self Care       Discharge Instructions    Call MD for:  redness, tenderness, or signs of infection (pain, swelling, redness, odor or green/yellow discharge around incision site)   Complete by: As directed    Call MD for:  temperature >100.4   Complete by: As directed    Diet - low sodium heart healthy   Complete by: As directed    Discharge instructions   Complete by: As directed    Walk at home as much as possible, at least 4 times / day   Increase activity slowly   Complete by: As directed    Lifting restrictions   Complete by: As directed    No lifting > 10 lbs   May shower / Bathe   Complete by: As directed    48 hours after surgery   May walk up steps   Complete by: As directed    No dressing needed   Complete by: As directed    Other Restrictions   Complete by: As directed    No bending/twisting at waist     Allergies as of 04/16/2020   No Known Allergies     Medication List    TAKE these medications   aspirin EC 81 MG tablet Take 81 mg by mouth daily.    citalopram 40 MG tablet Commonly known as: CELEXA Take 40 mg by mouth daily.   diclofenac sodium 1 % Gel Commonly known as: VOLTAREN Apply 2 g topically 4 (four) times daily. What changed:   when to take this  reasons to take this   hydrocerin Crea Apply 1 application topically as needed (Dry skin to feet).   hydrochlorothiazide 12.5 MG tablet Commonly known as: HYDRODIURIL Take 12.5 mg by mouth daily.   HYDROcodone-acetaminophen 5-325 MG tablet Commonly known as: NORCO/VICODIN Take 1 tablet by mouth every 6 (six) hours as needed for severe pain.   ibuprofen 200 MG tablet Commonly known as: ADVIL Take 400 mg by mouth every 6 (six) hours as needed for moderate pain.   loratadine 10 MG tablet Commonly known as: CLARITIN Take 10 mg by mouth daily.   lovastatin 20 MG tablet Commonly known as: MEVACOR Take 20 mg by mouth at bedtime.   methocarbamol 500 MG tablet Commonly known as: ROBAXIN Take 1 tablet (500 mg total) by mouth every 6 (six) hours as needed for muscle spasms.   methylphenidate 10 MG tablet Commonly known as: Ritalin Take 1 tablet (10 mg total) by mouth 2 (two) times daily with breakfast and lunch. At 0700 and 1200 daily   omeprazole 20 MG capsule Commonly known as: PRILOSEC Take 20 mg by mouth daily.   sennosides-docusate sodium 8.6-50  MG tablet Commonly known as: SENOKOT-S Take 2 tablets by mouth daily as needed for constipation.   traZODone 50 MG tablet Commonly known as: DESYREL TAKE 1 TABLET BY MOUTH AT BEDTIME            Discharge Care Instructions  (From admission, onward)         Start     Ordered   04/16/20 0000  No dressing needed        04/16/20 6659          Follow-up Information    Maeola Harman, MD Follow up.   Specialty: Neurosurgery Contact information: [address] [place] [address] [phone]               Signed: Jackelyn Hoehn 04/16/2020, 9:08 AM

## 2020-04-18 ENCOUNTER — Encounter (HOSPITAL_COMMUNITY): Payer: Self-pay | Admitting: Neurosurgery

## 2020-04-18 NOTE — Anesthesia Postprocedure Evaluation (Signed)
Anesthesia Post Note  Patient: Tyler Lane  Procedure(s) Performed: KYPHOPLASTY Thoracic twelve (N/A Back)     Patient location during evaluation: PACU Anesthesia Type: General Level of consciousness: awake and alert Pain management: pain level controlled Vital Signs Assessment: post-procedure vital signs reviewed and stable Respiratory status: spontaneous breathing, nonlabored ventilation, respiratory function stable and patient connected to nasal cannula oxygen Cardiovascular status: blood pressure returned to baseline and stable Postop Assessment: no apparent nausea or vomiting Anesthetic complications: no   No complications documented.  Last Vitals:  Vitals:   04/16/20 0325 04/16/20 0736  BP: 103/76 118/78  Pulse: 63 77  Resp: 18 18  Temp: 36.7 C 36.6 C  SpO2: 93% 96%    Last Pain:  Vitals:   04/16/20 1026  TempSrc:   PainSc: 4                  Pranit Owensby

## 2020-05-04 ENCOUNTER — Encounter: Payer: Medicare Other | Attending: Physical Medicine & Rehabilitation | Admitting: Physical Medicine & Rehabilitation

## 2020-05-04 DIAGNOSIS — S062X3S Diffuse traumatic brain injury with loss of consciousness of 1 hour to 5 hours 59 minutes, sequela: Secondary | ICD-10-CM | POA: Insufficient documentation

## 2020-05-04 DIAGNOSIS — S22000S Wedge compression fracture of unspecified thoracic vertebra, sequela: Secondary | ICD-10-CM | POA: Insufficient documentation

## 2020-05-17 ENCOUNTER — Other Ambulatory Visit: Payer: Self-pay

## 2020-05-18 MED ORDER — METHYLPHENIDATE HCL 10 MG PO TABS: 10.0000 mg | ORAL_TABLET | Freq: Two times a day (BID) | ORAL | 0 refills | Status: DC

## 2020-06-07 ENCOUNTER — Other Ambulatory Visit: Payer: Self-pay | Admitting: Physical Medicine & Rehabilitation

## 2020-06-07 DIAGNOSIS — S062X3S Diffuse traumatic brain injury with loss of consciousness of 1 hour to 5 hours 59 minutes, sequela: Secondary | ICD-10-CM

## 2020-06-16 ENCOUNTER — Other Ambulatory Visit: Payer: Self-pay

## 2020-06-16 NOTE — Telephone Encounter (Signed)
Tyler Lane wife called for his Methylphenidate refill.   (Per PMP - Last filled on 05/18/2020, Last written on 05/18/2020).

## 2020-06-17 MED ORDER — METHYLPHENIDATE HCL 10 MG PO TABS: 10.0000 mg | ORAL_TABLET | Freq: Two times a day (BID) | ORAL | 0 refills | Status: DC

## 2020-06-22 ENCOUNTER — Encounter: Payer: Self-pay | Admitting: Physical Medicine & Rehabilitation

## 2020-06-22 ENCOUNTER — Encounter: Payer: Medicare Other | Attending: Physical Medicine & Rehabilitation | Admitting: Physical Medicine & Rehabilitation

## 2020-06-22 ENCOUNTER — Other Ambulatory Visit: Payer: Self-pay

## 2020-06-22 VITALS — BP 123/91 | HR 77 | Temp 98.7°F | Ht 71.0 in | Wt 183.0 lb

## 2020-06-22 DIAGNOSIS — F988 Other specified behavioral and emotional disorders with onset usually occurring in childhood and adolescence: Secondary | ICD-10-CM | POA: Diagnosis not present

## 2020-06-22 DIAGNOSIS — S062X3S Diffuse traumatic brain injury with loss of consciousness of 1 hour to 5 hours 59 minutes, sequela: Secondary | ICD-10-CM

## 2020-06-22 DIAGNOSIS — Z5181 Encounter for therapeutic drug level monitoring: Secondary | ICD-10-CM

## 2020-06-22 DIAGNOSIS — G894 Chronic pain syndrome: Secondary | ICD-10-CM | POA: Diagnosis not present

## 2020-06-22 DIAGNOSIS — S22000S Wedge compression fracture of unspecified thoracic vertebra, sequela: Secondary | ICD-10-CM

## 2020-06-22 DIAGNOSIS — Z79891 Long term (current) use of opiate analgesic: Secondary | ICD-10-CM

## 2020-06-22 MED ORDER — METHYLPHENIDATE HCL 10 MG PO TABS: 10.0000 mg | ORAL_TABLET | Freq: Two times a day (BID) | ORAL | 0 refills | Status: DC

## 2020-06-22 NOTE — Patient Instructions (Signed)
PLEASE FEEL FREE TO CALL OUR OFFICE WITH ANY PROBLEMS OR QUESTIONS ([phone])      
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
## 2020-07-12 ENCOUNTER — Telehealth: Payer: Self-pay

## 2020-07-12 NOTE — Telephone Encounter (Signed)
Please update and send new Rx Methylphenidate 10 MG to the pharmacy.  Per Tyler Lane pt taking RX methylphenidate 15 mg in the AM and 10 MG in the PM.    Thank you

## 2020-07-12 NOTE — Telephone Encounter (Signed)
I have let her know.

## 2020-07-12 NOTE — Telephone Encounter (Signed)
That's not what I thought he was taking. My note says 10mg  in AM and 5-10mg  in PM

## 2020-07-14 ENCOUNTER — Telehealth: Payer: Self-pay

## 2020-07-14 DIAGNOSIS — S062X3S Diffuse traumatic brain injury with loss of consciousness of 1 hour to 5 hours 59 minutes, sequela: Secondary | ICD-10-CM

## 2020-07-14 NOTE — Telephone Encounter (Signed)
Mrs. Barg called back. She stated you put a "hand-written" note on the AVS. The note said to take Methylphenidate 15 MG in the AM & 10 MG in the PM.   She said that will leave him with a 6-5 day gap in medication. So that is why a new Rx will need to be sent to the pharmacy.  Call back ph [phone] Please advise again.

## 2020-07-15 MED ORDER — METHYLPHENIDATE HCL 10 MG PO TABS: 10.0000 mg | ORAL_TABLET | Freq: Two times a day (BID) | ORAL | 0 refills | Status: DC

## 2020-07-15 NOTE — Telephone Encounter (Signed)
Mrs. Derasmo informed of Dr. Riley Kill reply.

## 2020-07-15 NOTE — Addendum Note (Signed)
Addended by: Faith Rogue T on: 07/15/2020 11:53 AM   Modules accepted: Orders

## 2020-07-15 NOTE — Telephone Encounter (Signed)
I do remember writing that. I think we were going to see how things went. i've changed the order so it will allow for the extra half tablet.  thx

## 2020-07-18 ENCOUNTER — Other Ambulatory Visit: Payer: Self-pay | Admitting: Physical Medicine & Rehabilitation

## 2020-07-18 DIAGNOSIS — F5101 Primary insomnia: Secondary | ICD-10-CM

## 2020-09-01 ENCOUNTER — Telehealth: Payer: Self-pay | Admitting: *Deleted

## 2020-09-01 DIAGNOSIS — S062X3S Diffuse traumatic brain injury with loss of consciousness of 1 hour to 5 hours 59 minutes, sequela: Secondary | ICD-10-CM

## 2020-09-01 NOTE — Telephone Encounter (Signed)
Kendal Hymen called and asked for a refill on Tyler Lane's methyphenidate.  The pharmacy mistakenly filled the last refill on 08/10/20 with #60 instead of the #75.

## 2020-09-02 MED ORDER — METHYLPHENIDATE HCL 10 MG PO TABS: 10.0000 mg | ORAL_TABLET | Freq: Two times a day (BID) | ORAL | 0 refills | Status: DC

## 2020-09-02 NOTE — Telephone Encounter (Signed)
RF'ed rx.

## 2020-10-03 ENCOUNTER — Telehealth: Payer: Self-pay | Admitting: *Deleted

## 2020-10-03 DIAGNOSIS — S062X3S Diffuse traumatic brain injury with loss of consciousness of 1 hour to 5 hours 59 minutes, sequela: Secondary | ICD-10-CM

## 2020-10-03 MED ORDER — METHYLPHENIDATE HCL 10 MG PO TABS: 10.0000 mg | ORAL_TABLET | Freq: Two times a day (BID) | ORAL | 0 refills | Status: DC

## 2020-10-03 NOTE — Telephone Encounter (Signed)
RF'ed for august and september

## 2020-10-03 NOTE — Telephone Encounter (Signed)
Tyler Lane needs a refill on his methylphenidate. His last fill date was 09/02/20.

## 2020-10-19 ENCOUNTER — Encounter
Payer: Medicare (Managed Care) | Attending: Physical Medicine & Rehabilitation | Admitting: Physical Medicine & Rehabilitation

## 2020-10-19 ENCOUNTER — Other Ambulatory Visit: Payer: Self-pay

## 2020-10-19 ENCOUNTER — Encounter: Payer: Self-pay | Admitting: Physical Medicine & Rehabilitation

## 2020-10-19 VITALS — BP 136/64 | HR 73 | Temp 99.0°F | Ht 71.0 in | Wt 183.0 lb

## 2020-10-19 DIAGNOSIS — F988 Other specified behavioral and emotional disorders with onset usually occurring in childhood and adolescence: Secondary | ICD-10-CM | POA: Diagnosis not present

## 2020-10-19 DIAGNOSIS — S062X3S Diffuse traumatic brain injury with loss of consciousness of 1 hour to 5 hours 59 minutes, sequela: Secondary | ICD-10-CM

## 2020-10-19 DIAGNOSIS — S22000S Wedge compression fracture of unspecified thoracic vertebra, sequela: Secondary | ICD-10-CM | POA: Diagnosis present

## 2020-10-19 MED ORDER — METHYLPHENIDATE HCL 10 MG PO TABS: 10.0000 mg | ORAL_TABLET | Freq: Three times a day (TID) | ORAL | 0 refills | Status: DC
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
## 2020-10-19 NOTE — Patient Instructions (Addendum)
PLEASE FEEL FREE TO CALL OUR OFFICE WITH ANY PROBLEMS OR QUESTIONS [phone])  INCREASE YOUR WATER AND VEGGIE/FRUIT INTAKE.   YOU SHOULD HAVE A BM EVERY 2 DAYS AT LEAST  TRY SENNA-S SUPPLEMENT IF DIETARY CHANGES DON'T HELP

## 2020-10-20 ENCOUNTER — Telehealth: Payer: Self-pay

## 2020-10-20 NOTE — Telephone Encounter (Signed)
PA for Methylphenidate sent to plan.

## 2020-10-21 NOTE — Telephone Encounter (Signed)
No indication in the problem list for attention deficit disorder even though it is indicated in the note 10/19/20 Patient still demonstrates some attention deficits but demonstrates reasonable insight and awareness. I did send the note with the PA. I will appeal it and send note again and point out the attention deficit.

## 2020-10-21 NOTE — Telephone Encounter (Signed)
Methylphenidate denied.

## 2020-10-24 NOTE — Telephone Encounter (Signed)
Methylphenidate 10 mg approved 10/20/2020 until 02/11/2038. Pharmacy notified.

## 2020-10-26 ENCOUNTER — Other Ambulatory Visit: Payer: Self-pay | Admitting: Physical Medicine & Rehabilitation

## 2020-10-26 DIAGNOSIS — F5101 Primary insomnia: Secondary | ICD-10-CM

## 2020-11-18 ENCOUNTER — Other Ambulatory Visit: Payer: Self-pay | Admitting: Physical Medicine & Rehabilitation

## 2020-11-18 DIAGNOSIS — F5101 Primary insomnia: Secondary | ICD-10-CM

## 2020-11-25 ENCOUNTER — Telehealth: Payer: Self-pay

## 2020-11-25 MED ORDER — METHYLPHENIDATE HCL 10 MG PO TABS: 10.0000 mg | ORAL_TABLET | Freq: Three times a day (TID) | ORAL | 0 refills | Status: DC

## 2020-11-25 NOTE — Telephone Encounter (Signed)
[facility] [place]

## 2020-11-25 NOTE — Telephone Encounter (Signed)
Informed Kendal Hymen that [facility] was [facility] [place]

## 2020-11-25 NOTE — Telephone Encounter (Signed)
Patient's significant other, Tyler Lane, called stating [facility] is out of stock with the Methylphenidate. She has attempted to call several pharmacy with no luck. The only pharmacy that she found [facility] in stock [facility] [place] [place]. She wants to know if the medication can be sent in there. Please advise.

## 2020-11-28 ENCOUNTER — Encounter: Payer: Self-pay | Admitting: Physical Medicine & Rehabilitation

## 2020-12-21 ENCOUNTER — Telehealth: Payer: Self-pay | Admitting: Physical Medicine & Rehabilitation

## 2020-12-21 NOTE — Telephone Encounter (Signed)
[facility] full, Attempted to contact to tell them we are out of network with [facility]

## 2020-12-23 ENCOUNTER — Telehealth: Payer: Self-pay

## 2020-12-23 MED ORDER — METHYLPHENIDATE HCL 10 MG PO TABS: 10.0000 mg | ORAL_TABLET | Freq: Three times a day (TID) | ORAL | 0 refills | Status: DC

## 2020-12-23 NOTE — Telephone Encounter (Signed)
I filled for this month and next

## 2020-12-23 NOTE — Telephone Encounter (Signed)
PMP REPORT:  Filled  Written  ID  Drug  QTY  Days  Prescriber  RX #  Dispenser  Refill  Daily Dose*  Pymt Type  PMP  11/25/2020 11/25/2020 1  Methylphenidate 10 Mg Tablet 90.00 20 Za Swa [id number] Nor ([id number]) 0/0  [employer]

## 2020-12-23 NOTE — Telephone Encounter (Signed)
Called patient no answer.

## 2020-12-26 ENCOUNTER — Telehealth: Payer: Self-pay | Admitting: *Deleted

## 2020-12-26 MED ORDER — METHYLPHENIDATE HCL 10 MG PO TABS: 10.0000 mg | ORAL_TABLET | Freq: Three times a day (TID) | ORAL | 0 refills | Status: DC

## 2020-12-26 NOTE — Telephone Encounter (Signed)
Genice Rouge called and reports that [facility] does not have methylphenidate and it needs to be sent to the [facility] [place].  I have verified they have not had in "well over a month", so the current month Rx was cancelled and they will keep the one for the following month. Please send a new rx to [facility] [place].

## 2020-12-26 NOTE — Telephone Encounter (Signed)
done

## 2021-01-04 ENCOUNTER — Other Ambulatory Visit: Payer: Self-pay | Admitting: Physical Medicine & Rehabilitation

## 2021-01-04 DIAGNOSIS — S062X3S Diffuse traumatic brain injury with loss of consciousness of 1 hour to 5 hours 59 minutes, sequela: Secondary | ICD-10-CM

## 2021-01-04 NOTE — Telephone Encounter (Signed)
Pharmacy sent a refill request for Methocarbamol. Warning appeared due to geriatric age.

## 2021-01-23 ENCOUNTER — Telehealth: Payer: Self-pay

## 2021-01-23 MED ORDER — METHYLPHENIDATE HCL 10 MG PO TABS: 10.0000 mg | ORAL_TABLET | Freq: Three times a day (TID) | ORAL | 0 refills | Status: DC

## 2021-01-23 MED ORDER — METHYLPHENIDATE HCL 10 MG PO TABS
10.0000 mg | ORAL_TABLET | Freq: Three times a day (TID) | ORAL | 0 refills | Status: DC
Start: 2021-01-23 — End: 2021-03-01

## 2021-01-23 NOTE — Telephone Encounter (Signed)
Patient's significant other called to request a refill of Methylphenidate. Last fill 12/26/20. [facility] [place]

## 2021-01-23 NOTE — Telephone Encounter (Signed)
Rx's sent in for December and january

## 2021-01-23 NOTE — Addendum Note (Signed)
Addended by: Faith Rogue T on: 01/23/2021 04:03 PM   Modules accepted: Orders

## 2021-02-15 ENCOUNTER — Ambulatory Visit: Payer: Medicare (Managed Care) | Admitting: Physical Medicine & Rehabilitation

## 2021-03-01 ENCOUNTER — Encounter: Payer: Medicare PPO | Attending: Physical Medicine & Rehabilitation | Admitting: Physical Medicine & Rehabilitation

## 2021-03-01 ENCOUNTER — Encounter: Payer: Self-pay | Admitting: Physical Medicine & Rehabilitation

## 2021-03-01 ENCOUNTER — Other Ambulatory Visit: Payer: Self-pay

## 2021-03-01 VITALS — BP 120/85 | HR 86 | Ht 71.0 in | Wt 180.0 lb

## 2021-03-01 DIAGNOSIS — G479 Sleep disorder, unspecified: Secondary | ICD-10-CM | POA: Diagnosis not present

## 2021-03-01 DIAGNOSIS — H532 Diplopia: Secondary | ICD-10-CM | POA: Diagnosis not present

## 2021-03-01 DIAGNOSIS — S062X3S Diffuse traumatic brain injury with loss of consciousness of 1 hour to 5 hours 59 minutes, sequela: Secondary | ICD-10-CM | POA: Diagnosis not present

## 2021-03-01 MED ORDER — METHYLPHENIDATE HCL 10 MG PO TABS: 10.0000 mg | ORAL_TABLET | Freq: Three times a day (TID) | ORAL | 0 refills | Status: DC

## 2021-03-01 NOTE — Progress Notes (Signed)
Subjective:    Patient ID: Tyler Lane, male    DOB: 29-Apr-1950, 71 y.o.   MRN: [id number]  HPI  Rena is here in follow up of his TBI. He has been doing fairly well at home. He's itching to get back on the golf course. He tries to stay somewhat active. He walks the dog regularly.   He remains on ritalin for his concentration. This provides a boost in his attention and concentration  His pain levels are much improved.   Pain Inventory Average Pain 2 Pain Right Now 2 My pain is intermittent  LOCATION OF PAIN  BACK, NECK, RIB  BOWEL Number of stools per week: 1 Oral laxative use Yes  Type of laxative OTC Enema or suppository use No  History of colostomy No  Incontinent No   BLADDER Normal In and out cath, frequency no Able to self cath No  Bladder incontinence No  Frequent urination Yes  Leakage with coughing No  Difficulty starting stream Yes  Incomplete bladder emptying Yes    Mobility walk without assistance use a cane ability to climb steps?  yes do you drive?  no  Function retired  Neuro/Psych depression anxiety  Prior Studies Any changes since last visit?  no  Physicians involved in your care Any changes since last visit?  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   Drug use: No   Sexual activity: Not on file  Other Topics Concern   Not on file  Social History  Narrative   Not on file   Social Determinants of Health   Financial Resource Strain: Not on file  Food Insecurity: Not on file  Transportation Needs: Not on file  Physical Activity: Not on file  Stress: Not on file  Social Connections: Not on file   Past Surgical History:  Procedure Laterality Date   KYPHOPLASTY N/A 04/15/2020   Procedure: KYPHOPLASTY Thoracic twelve;  Surgeon: Erline Levine, MD;  Location: [place];  Service: Neurosurgery;  Laterality: N/A;   NASAL SINUS SURGERY     Past Medical History:  Diagnosis Date   Back injury 01/16/2020   Depression    Heart murmur    "closed"   Hepatitis    hepatatis C - treated    High cholesterol    HOH (hard of hearing)    mild in the right and moderate to severe in the left   Hypertension    Pneumonia    SAH (subarachnoid hemorrhage) ([place]) 2018   TBI (traumatic brain injury) 2018   Tinnitus of left ear    BP 120/85    Pulse 86    Ht 5\' 11"  (1.803 m)    Wt 180 lb (81.6 kg)    SpO2 97%  BMI 25.10 kg/m   Opioid Risk Score:   Fall Risk Score:  `1  Depression screen PHQ 2/9  Depression screen [facility] 2/9 03/02/2020 05/27/2017  Decreased Interest 0 0  Down, Depressed, Hopeless 0 2  PHQ - 2 Score 0 2  Altered sleeping - 0  Tired, decreased energy - 0  Change in appetite - 0  Feeling bad or failure about yourself  - 0  Trouble concentrating - 0  Moving slowly or fidgety/restless - 0  Suicidal thoughts - 0  PHQ-9 Score - 2  Difficult doing work/chores - Somewhat difficult    Review of Systems  Genitourinary:        Decrease stream   Neurological:  Positive for dizziness.  Psychiatric/Behavioral:         Anxiety, depression  All other systems reviewed and are negative.     Objective:   Physical Exam  General: No acute distress HEENT: NCAT, EOMI, oral membranes moist Cards: reg rate  Chest: normal effort Abdomen: Soft, NT, ND Skin: dry, intact Extremities: no edema Psych: pleasant still a little impulsive     Neuro: gaze appears conjugate..  Patient still demonstrates some attention deficits but demonstrates reasonable insight and awareness..   .. Motor 5/5.  Gait is stable  musculoskeletal:   Mild low back tenderness.  Range of motion remains functional.        Assessment & Plan:  1.  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 2.  Pain Management:              HEP for back per neurosurgery.  4. Mood: continue celexa-            -improved, wife is supportive  as always 5. Neuropsych:             - Ritalin to 10 mg at 7 AM, 11 AM, and 3 PM daily.         -We will continue the controlled substance monitoring program, this consists of regular clinic visits, examinations, routine drug screening, pill counts as well as use of [place] Controlled Substance Reporting System. [place] was reviewed today.   -Medication was refilled and a second prescription was sent to the patient's pharmacy for next month.  .    6. Hx of right displaced sacral and right pubic rami fracture: WBAT 7. Visual changes: needs single vision lenses to drive.       -Ongoing diplopia.    per ophthalmology       -stable 9. Right clavicle fracture: ortho signed off 10.  Fatigue:         -trazodone for sleep has been effective           -continue physical activity as tolerated  11.  T12 compression fx                                     much mproved after kyphoplasty                                     -robaxin and ibuprofen PRN                                     -  15 minutes of face to face patient care time were spent during this visit. All questions were encouraged and answered.  Follow up with me in 4 mos .

## 2021-03-01 NOTE — Patient Instructions (Signed)
PLEASE FEEL FREE TO CALL OUR OFFICE WITH ANY PROBLEMS OR QUESTIONS ([phone])      

## 2021-03-23 ENCOUNTER — Telehealth: Payer: Self-pay

## 2021-03-23 DIAGNOSIS — S062X3S Diffuse traumatic brain injury with loss of consciousness of 1 hour to 5 hours 59 minutes, sequela: Secondary | ICD-10-CM

## 2021-03-23 NOTE — Telephone Encounter (Signed)
[facility] in [place] does not have Methylphenidate in stock but [facility] [place] [facility]. Can [facility] [place]. Prescription at [facility] has been cancelled.

## 2021-03-24 MED ORDER — METHYLPHENIDATE HCL 10 MG PO TABS: 10.0000 mg | ORAL_TABLET | Freq: Three times a day (TID) | ORAL | 0 refills | Status: DC

## 2021-03-24 NOTE — Telephone Encounter (Signed)
done

## 2021-04-10 ENCOUNTER — Other Ambulatory Visit: Payer: Self-pay | Admitting: Physical Medicine & Rehabilitation

## 2021-04-10 DIAGNOSIS — F5101 Primary insomnia: Secondary | ICD-10-CM

## 2021-04-20 ENCOUNTER — Telehealth: Payer: Self-pay | Admitting: *Deleted

## 2021-04-20 DIAGNOSIS — S062X3S Diffuse traumatic brain injury with loss of consciousness of 1 hour to 5 hours 59 minutes, sequela: Secondary | ICD-10-CM

## 2021-04-20 NOTE — Telephone Encounter (Signed)
Horris Latino called to request refill on [facility] [address] methylphenidate. Last fill date was 03/24/21 and next appt is 06/28/21. ?

## 2021-04-21 ENCOUNTER — Telehealth: Payer: Self-pay | Admitting: *Deleted

## 2021-04-21 DIAGNOSIS — S062X3S Diffuse traumatic brain injury with loss of consciousness of 1 hour to 5 hours 59 minutes, sequela: Secondary | ICD-10-CM

## 2021-04-21 MED ORDER — METHYLPHENIDATE HCL 10 MG PO TABS: 10.0000 mg | ORAL_TABLET | Freq: Three times a day (TID) | ORAL | 0 refills | Status: DC

## 2021-04-21 NOTE — Telephone Encounter (Signed)
Notified. 

## 2021-04-21 NOTE — Telephone Encounter (Signed)
Refilled for march/april ?

## 2021-04-21 NOTE — Telephone Encounter (Signed)
[facility] does not have the methylphenidate. Please send rx to [facility] [place]. ?

## 2021-05-10 ENCOUNTER — Other Ambulatory Visit: Payer: Self-pay | Admitting: Physical Medicine & Rehabilitation

## 2021-05-10 DIAGNOSIS — F5101 Primary insomnia: Secondary | ICD-10-CM

## 2021-05-16 ENCOUNTER — Other Ambulatory Visit: Payer: Self-pay | Admitting: Physical Medicine & Rehabilitation

## 2021-06-16 ENCOUNTER — Other Ambulatory Visit: Payer: Self-pay

## 2021-06-16 DIAGNOSIS — S062X3S Diffuse traumatic brain injury with loss of consciousness of 1 hour to 5 hours 59 minutes, sequela: Secondary | ICD-10-CM

## 2021-06-16 MED ORDER — METHYLPHENIDATE HCL 10 MG PO TABS: 10.0000 mg | ORAL_TABLET | Freq: Three times a day (TID) | ORAL | 0 refills | Status: DC

## 2021-06-16 NOTE — Telephone Encounter (Signed)
Rx's sent for may and june ?

## 2021-06-16 NOTE — Telephone Encounter (Signed)
Filled  Written  ID  Drug  QTY  Days  Prescriber  RX #  Dispenser  Refill  Daily Dose*  Pymt Type  PMP  ?05/21/2021 04/21/2021 1  ?Methylphenidate 10 Mg Tablet ?90.00 30 Za Swa [id number] Nor ([id number]) 0/0  Medicare [place] ?

## 2021-06-28 ENCOUNTER — Encounter: Payer: Self-pay | Admitting: Physical Medicine & Rehabilitation

## 2021-06-28 ENCOUNTER — Encounter: Payer: Medicare PPO | Attending: Physical Medicine & Rehabilitation | Admitting: Physical Medicine & Rehabilitation

## 2021-06-28 VITALS — BP 133/83 | HR 75 | Ht 71.0 in | Wt 179.8 lb

## 2021-06-28 DIAGNOSIS — M7912 Myalgia of auxiliary muscles, head and neck: Secondary | ICD-10-CM

## 2021-06-28 DIAGNOSIS — M542 Cervicalgia: Secondary | ICD-10-CM | POA: Diagnosis not present

## 2021-06-28 DIAGNOSIS — S062X3S Diffuse traumatic brain injury with loss of consciousness of 1 hour to 5 hours 59 minutes, sequela: Secondary | ICD-10-CM | POA: Diagnosis present

## 2021-06-28 NOTE — Progress Notes (Signed)
? ?Subjective:  ? ? Patient ID: Tyler Lane, male    DOB: 1951-02-09, 71 y.o.   MRN: [id number] ? ?HPI ? ?Tyler Lane is here in follow up of his TBI and associated deficits. He reports that he's having more pain in his neck. It is generally in the right side with radiation into the right shoulder. He has days where he can't get ahead of the pain. Sometimes the weather excacerbates it. It can be positional also.  ? ?Ibuprofen seems to help to an extent for flares. He may use twice on a bad day. He also will use voltaren gel as well as his muscle relaxant.  ? ?Tyler Lane remains on ritalin for attention.  ? ?He also c/o dizziness when he looks up. He feels that his glass rx is not working for him any more (prisms). He never got his reading glasses.  ? ?Pain Inventory ?Average Pain 3 ?Pain Right Now 1 ?My pain is sharp and dull ? ?In the last 24 hours, has pain interfered with the following? ?General activity 4 ?Relation with others 0 ?Enjoyment of life 4 ?What TIME of day is your pain at its worst? daytime ?Sleep (in general) Fair ? ?Pain is worse with:  weather change ?Pain improves with: rest ?Relief from Meds: 3 ? ?Family History  ?Problem Relation Age of Onset  ? Diabetes Mother   ? Heart attack Father   ? ?Social History  ? ?Socioeconomic History  ? Marital status: Significant Other  ?  Spouse name: [name]  ? Number of children: 2  ? Years of education: Not on file  ? Highest education level: Not on file  ?Occupational History  ? Occupation: retired  ?Tobacco Use  ? Smoking status: Former  ?  Packs/day: 0.00  ?  Years: 20.00  ?  Pack years: 0.00  ?  Types: Cigarettes  ? Smokeless tobacco: Never  ?Vaping Use  ? Vaping Use: Never used  ?Substance and Sexual Activity  ? Alcohol use: No  ?  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 Drug use: No  ? Sexual activity: Not on file  ?Other Topics Concern  ? Not on file  ?Social History Narrative  ? Not on file  ? ?Social Determinants of Health  ? ?Financial Resource  Strain: Not on file  ?Food Insecurity: Not on file  ?Transportation Needs: Not on file  ?Physical Activity: Not on file  ?Stress: Not on file  ?Social Connections: Not on file  ? ?Past Surgical History:  ?Procedure Laterality Date  ? KYPHOPLASTY N/A 04/15/2020  ? Procedure: KYPHOPLASTY Thoracic twelve;  Surgeon: Erline Levine, MD;  Location: [place];  Service: Neurosurgery;  Laterality: N/A;  ? NASAL SINUS SURGERY    ? ?Past Surgical History:  ?Procedure Laterality Date  ? KYPHOPLASTY N/A 04/15/2020  ? Procedure: KYPHOPLASTY Thoracic twelve;  Surgeon: Erline Levine, MD;  Location: [place];  Service: Neurosurgery;  Laterality: N/A;  ? NASAL SINUS SURGERY    ? ?Past Medical History:  ?Diagnosis Date  ? Back injury 01/16/2020  ? Depression   ? Heart murmur   ? "closed"  ? Hepatitis   ? hepatatis C - treated   ? High cholesterol   ? HOH (hard of hearing)   ? mild in the right and moderate to severe in the left  ? Hypertension   ? Pneumonia   ? SAH (subarachnoid hemorrhage) ([place]) 2018  ? TBI (traumatic brain injury) 2018  ? Tinnitus of left ear   ? ?  BP 133/83   Pulse 75   Ht 5\' 11"  (1.803 m)   Wt 179 lb 12.8 oz (81.6 kg)   SpO2 96%   BMI 25.08 kg/m?  ? ?Opioid Risk Score:   ?Fall Risk Score:  `1 ? ?Depression screen PHQ 2/9 ? ? ?  03/01/2021  ?  2:04 PM 03/02/2020  ?  1:25 PM 05/27/2017  ?  2:55 PM  ?Depression screen PHQ 2/9  ?Decreased Interest 1 0 0  ?Down, Depressed, Hopeless 1 0 2  ?PHQ - 2 Score 2 0 2  ?Altered sleeping   0  ?Tired, decreased energy   0  ?Change in appetite   0  ?Feeling bad or failure about yourself    0  ?Trouble concentrating   0  ?Moving slowly or fidgety/restless   0  ?Suicidal thoughts   0  ?PHQ-9 Score   2  ?Difficult doing work/chores   Somewhat difficult  ?  ? ?Review of Systems  ?Musculoskeletal:  Positive for back pain and neck pain.  ?     Bilateral hip pain  ?All other systems reviewed and are negative. ? ?   ?Objective:  ? Physical Exam ?Constitutional: No distress . Vital signs  reviewed. ?HEENT: NCAT, EOMI, oral membranes moist ?Neck: supple ?Cardiovascular: RRR without murmur. No JVD    ?Respiratory/Chest: CTA Bilaterally without wheezes or rales. Normal effort    ?GI/Abdomen: BS +, non-tender, non-distended ?Ext: no clubbing, cyanosis, or edema ?Psych: pleasant and cooperative, concentration lapses ? Neuro: gaze appears conjugate..  Patient still demonstrates some attention deficits but demonstrates reasonable insight and awareness..   .. Motor 5/5.  Gait is stable  ?musculoskeletal:  right trap taut, tender with palpation. Limited neck flexion and lateral rotation to left. ?  ?  ?  ?Assessment & Plan:  ?1.  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         -continue with HEP. ? -  ?2.  right myofascial neck pain involving trapezius ? -referred to PT ? -continue heat, ice, stretching ? -xrays of right neck ?4. Mood: continue celexa- ?           -improved, wife is supportive  as always ?5. Neuropsych:  ?           - Ritalin 10 mg at 7 AM, 11 AM, and 3 PM daily.         ?We will continue the controlled substance monitoring program, this consists of regular clinic visits, examinations, routine drug screening, pill counts as well as use of [place] Controlled Substance Reporting System. [place] was reviewed today.   ?-has active rx'es ?6. Hx of right displaced sacral and right pubic rami fracture: WBAT ?7. Visual changes: needs single vision lenses to drive.  ?     -Ongoing diplopia.    per ophthalmology ?      -recommend f/u with regular ophtho for rx'es, reassessment ?9. Right clavicle fracture: ortho signed off ?10.  Fatigue: ?        -trazodone for sleep     ?        -continue physical activity as tolerated  ?11.  T12 compression fx ?                                    much mproved after kyphoplasty ?                                    -  robaxin and ibuprofen PRN ?                                    -      ?  ?30 minutes of face to face  patient care time were spent during this visit. All questions were encouraged and answered.  Follow up with me in 2 mos .  ? ? ? ? ?   ?  ? ? ?

## 2021-06-28 NOTE — Patient Instructions (Signed)
PLEASE FEEL FREE TO CALL OUR OFFICE WITH ANY PROBLEMS OR QUESTIONS ([phone])      

## 2021-08-02 ENCOUNTER — Ambulatory Visit
Admission: RE | Admit: 2021-08-02 | Discharge: 2021-08-02 | Disposition: A | Discharge status: Home / Self Care | Payer: Medicare PPO | Source: Ambulatory Visit | Attending: Physical Medicine & Rehabilitation | Admitting: Physical Medicine & Rehabilitation

## 2021-08-02 DIAGNOSIS — M542 Cervicalgia: Secondary | ICD-10-CM

## 2021-08-08 ENCOUNTER — Encounter: Payer: Self-pay | Admitting: Physical Therapy

## 2021-08-08 ENCOUNTER — Ambulatory Visit: Payer: Medicare PPO | Attending: Physical Medicine & Rehabilitation | Admitting: Physical Therapy

## 2021-08-08 DIAGNOSIS — G8929 Other chronic pain: Secondary | ICD-10-CM | POA: Insufficient documentation

## 2021-08-08 DIAGNOSIS — R293 Abnormal posture: Secondary | ICD-10-CM | POA: Diagnosis present

## 2021-08-08 DIAGNOSIS — R600 Localized edema: Secondary | ICD-10-CM | POA: Diagnosis present

## 2021-08-08 DIAGNOSIS — S062X3S Diffuse traumatic brain injury with loss of consciousness of 1 hour to 5 hours 59 minutes, sequela: Secondary | ICD-10-CM | POA: Insufficient documentation

## 2021-08-08 DIAGNOSIS — R278 Other lack of coordination: Secondary | ICD-10-CM | POA: Diagnosis present

## 2021-08-08 DIAGNOSIS — M25511 Pain in right shoulder: Secondary | ICD-10-CM | POA: Diagnosis present

## 2021-08-08 DIAGNOSIS — M542 Cervicalgia: Secondary | ICD-10-CM | POA: Diagnosis not present

## 2021-08-09 ENCOUNTER — Ambulatory Visit: Payer: Medicare PPO | Admitting: Physical Therapy

## 2021-08-09 ENCOUNTER — Encounter: Payer: Self-pay | Admitting: Physical Therapy

## 2021-08-09 DIAGNOSIS — G8929 Other chronic pain: Secondary | ICD-10-CM

## 2021-08-09 DIAGNOSIS — M25511 Pain in right shoulder: Secondary | ICD-10-CM | POA: Diagnosis not present

## 2021-08-09 DIAGNOSIS — R600 Localized edema: Secondary | ICD-10-CM

## 2021-08-09 DIAGNOSIS — R293 Abnormal posture: Secondary | ICD-10-CM

## 2021-08-09 NOTE — Therapy (Signed)
OUTPATIENT PHYSICAL THERAPY CERVICAL EVALUATION   Patient Name: Tyler Lane MRN: [id number] DOB:30-Aug-1950, 71 y.o., male Today's Date: 08/09/2021   PT End of Session - 08/09/21 1152     Visit Number 2    PT Start Time 1150    PT Stop Time 1230    PT Time Calculation (min) 40 min    Activity Tolerance Patient tolerated treatment well    Behavior During Therapy [facility] for tasks assessed/performed             Past Medical History:  Diagnosis Date   Back injury 01/16/2020   Depression    Heart murmur    "closed"   Hepatitis    hepatatis C - treated    High cholesterol    HOH (hard of hearing)    mild in the right and moderate to severe in the left   Hypertension    Pneumonia    SAH (subarachnoid hemorrhage) (HCC) 2018   TBI (traumatic brain injury) (HCC) 2018   Tinnitus of left ear    Past Surgical History:  Procedure Laterality Date   KYPHOPLASTY N/A 04/15/2020   Procedure: KYPHOPLASTY Thoracic twelve;  Surgeon: Maeola Harman, MD;  Location: [facility] OR;  Service: Neurosurgery;  Laterality: N/A;   NASAL SINUS SURGERY     Patient Active Problem List   Diagnosis Date Noted   Myofascial neck pain 06/28/2021   Thoracic vertebral fracture (HCC) 04/15/2020   Thoracic compression fracture, sequela 03/02/2020   Fatigue 08/26/2017   Primary insomnia 08/26/2017   Diplopia 12/17/2016   Sleep disorder    Hyperglycemia    Benign essential HTN    Dysphagia    SAH (subarachnoid hemorrhage) (HCC) 07/02/2016   Intracerebral hemorrhage, intraventricular (HCC) 07/02/2016   Right clavicle fracture 07/02/2016   Fracture of multiple ribs 07/02/2016   Sacral fracture (HCC) 07/02/2016   Chest trauma    Closed fracture of one rib    Urinary retention    Pneumonia of right lower lobe due to methicillin susceptible Staphylococcus aureus (MSSA) (HCC)    Prerenal azotemia    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  PERTINENT HISTORY:  TBI, clavicular fracture, thoracic compression fracture.  PAIN:  Are you having pain? Yes: NPRS scale: 0/10 Pain location: R neck and upper shoulder Pain description: tightness, ache Aggravating factors: yardwork Relieving factors: lying down  PRECAUTIONS: None  WEIGHT BEARING RESTRICTIONS No  FALLS:  Has patient fallen in last 6 months? No  LIVING ENVIRONMENT: Lives with: lives with their spouse Lives in: House/apartment Stairs: Yes: External: 3 steps; on right going up Has following equipment at home: None  OCCUPATION: retired  PLOF: Independent  PATIENT GOALS Decrease pain.  OBJECTIVE:   DIAGNOSTIC FINDINGS:  No xray, MRI     POSTURE: rounded shoulders, forward head, and R shoulder elevated, R scapula elevated and upwardly rotated   CERVICAL ROM:   Active ROM A/PROM (deg) eval  Flexion 100%  Extension 30%  Right lateral flexion 30%  Left lateral flexion 50%  Right rotation 80%  Left rotation 80%   (Blank rows = not tested)  UPPER EXTREMITY ROM:WFL B  UPPER EXTREMITY MMT: WNL B, however, with severe spasms in shoulders, anticipate he may have some weakness once the scapulae have returned to a normal  resting position.   TODAY'S TREATMENT:  08/09/21 UBE L1 x 3 min some increase in pain in the R shoulder STM to cervical and shoulder musculature, PROM for R scapula and shoulder 2lb WaTE bilat Flex, Ext, IR x10 ER yellow 2x10  Rows and Ext green 2x10 Eval STM to cervical and shoulder musculature, PROM for R scapula   PATIENT EDUCATION:  Education details: POC, HEP Person educated: Patient and Spouse Education method: Explanation, Demonstration, and Handouts Education comprehension: verbalized understanding and returned demonstration   HOME EXERCISE PROGRAM:  [id number]  ASSESSMENT:  CLINICAL IMPRESSION: Patient enters doing well reports improvement from yesterdays session. Pt did report an increase in R shoulder tightness with UBE warm up. UT tenderness reported with palpation. Some winging of the R scapular noted at rest. Tactiel cues needed with ER and rows for proper execution of interventions. Pt had a difficulty time relaxing with PROM  OBJECTIVE IMPAIRMENTS decreased activity tolerance, decreased mobility, decreased ROM, decreased strength, hypomobility, increased fascial restrictions, increased muscle spasms, impaired flexibility, impaired tone, impaired UE functional use, improper body mechanics, postural dysfunction, and pain.   ACTIVITY LIMITATIONS carrying, lifting, sleeping, and reach over head  PARTICIPATION LIMITATIONS: cleaning, laundry, and yard work  PERSONAL FACTORS Past/current experiences, Time since onset of injury/illness/exacerbation, and 1-2 comorbidities: TBI and Thoracic compression fracture in the past  are also affecting patient's functional outcome.   REHAB POTENTIAL: Good  CLINICAL DECISION MAKING: Stable/uncomplicated  EVALUATION COMPLEXITY: Moderate   GOALS: Goals reviewed with patient? Yes  SHORT TERM GOALS: Target date: 09/06/2021   Patient will be I with basic HEP Baseline: provided. Goal status: INITIAL  LONG TERM GOALS: Target  date: 10/18/2021  I with final HEP Baseline:  Goal status: INITIAL  2.  Patient will stand with normalized posture, including B scapulae resting in normal position, minimized rounded shoulders. Baseline: severe rounded shoulders, R scapula elevated and upwrdly rotated. Goal status: INITIAL  3.  Paitent will report no more than 2/10 pain at any time throughout his daily activities. Baseline: up to 6/10 Goal status: INITIAL  4.  Patient will demonstrate normal scapulo-humeral rhythm during R shoulder elevation. Baseline: R shoulder held in elevation and upward rotation. Goal status: INITIAL  5.  Patient will demosntrate normal muscle tone in R upper quadrant. Baseline: severe spasms in  entire R upper quadrant. Goal status: INITIAL  PLAN: PT FREQUENCY: 2x/week  PT DURATION: 10 weeks  PLANNED INTERVENTIONS: Therapeutic exercises, Therapeutic activity, Neuromuscular re-education, Balance training, Gait training, Patient/Family education, Joint mobilization, Dry Needling, Electrical stimulation, Cryotherapy, Moist heat, Taping, Vasopneumatic device, Ultrasound, Ionotophoresis 4mg /ml Dexamethasone, and Manual therapy  PLAN FOR NEXT SESSION: Assess tolerance to Hep, update to include strength, treat acute pain and spasm in upper trunk.   , DPT 08/09/2021, 11:53 AM

## 2021-08-16 ENCOUNTER — Telehealth: Payer: Self-pay | Admitting: *Deleted

## 2021-08-16 DIAGNOSIS — S062X3S Diffuse traumatic brain injury with loss of consciousness of 1 hour to 5 hours 59 minutes, sequela: Secondary | ICD-10-CM

## 2021-08-16 MED ORDER — METHYLPHENIDATE HCL 10 MG PO TABS: 10.0000 mg | ORAL_TABLET | Freq: Three times a day (TID) | ORAL | 0 refills | Status: DC

## 2021-08-16 MED ORDER — METHYLPHENIDATE HCL 10 MG PO TABS
10.0000 mg | ORAL_TABLET | Freq: Three times a day (TID) | ORAL | 0 refills | Status: DC
Start: 2021-08-16 — End: 2021-10-19

## 2021-08-16 NOTE — Telephone Encounter (Signed)
Rxs sent

## 2021-08-16 NOTE — Telephone Encounter (Signed)
Ms [name] called for a refill on Tyler Lane's methylphenidate.

## 2021-08-21 ENCOUNTER — Encounter: Payer: Self-pay | Admitting: Physical Therapy

## 2021-08-21 ENCOUNTER — Ambulatory Visit: Payer: Medicare PPO | Attending: Physical Medicine & Rehabilitation | Admitting: Physical Therapy

## 2021-08-21 DIAGNOSIS — R293 Abnormal posture: Secondary | ICD-10-CM | POA: Diagnosis present

## 2021-08-21 DIAGNOSIS — R600 Localized edema: Secondary | ICD-10-CM | POA: Insufficient documentation

## 2021-08-21 DIAGNOSIS — R278 Other lack of coordination: Secondary | ICD-10-CM | POA: Insufficient documentation

## 2021-08-21 DIAGNOSIS — M25511 Pain in right shoulder: Secondary | ICD-10-CM | POA: Insufficient documentation

## 2021-08-21 DIAGNOSIS — R252 Cramp and spasm: Secondary | ICD-10-CM | POA: Insufficient documentation

## 2021-08-21 DIAGNOSIS — G8929 Other chronic pain: Secondary | ICD-10-CM | POA: Diagnosis present

## 2021-08-21 NOTE — Therapy (Signed)
OUTPATIENT PHYSICAL THERAPY CERVICAL EVALUATION   Patient Name: Tyler Lane MRN: [id number] DOB:[birth year], 71 y.o., male Today's Date: 08/21/2021   PT End of Session - 08/21/21 1156     Visit Number 3    Date for PT Re-Evaluation 10/17/21    PT Start Time 1152    PT Stop Time 1230    PT Time Calculation (min) 38 min    Activity Tolerance Patient tolerated treatment well    Behavior During Therapy [facility] for tasks assessed/performed             Past Medical History:  Diagnosis Date   Back injury 01/16/2020   Depression    Heart murmur    "closed"   Hepatitis    hepatatis C - treated    High cholesterol    HOH (hard of hearing)    mild in the right and moderate to severe in the left   Hypertension    Pneumonia    SAH (subarachnoid hemorrhage) (HCC) 2018   TBI (traumatic brain injury) (HCC) 2018   Tinnitus of left ear    Past Surgical History:  Procedure Laterality Date   KYPHOPLASTY N/A 04/15/2020   Procedure: KYPHOPLASTY Thoracic twelve;  Surgeon: Maeola Harman, MD;  Location: [facility] OR;  Service: Neurosurgery;  Laterality: N/A;   NASAL SINUS SURGERY     Patient Active Problem List   Diagnosis Date Noted   Myofascial neck pain 06/28/2021   Thoracic vertebral fracture (HCC) 04/15/2020   Thoracic compression fracture, sequela 03/02/2020   Fatigue 08/26/2017   Primary insomnia 08/26/2017   Diplopia 12/17/2016   Sleep disorder    Hyperglycemia    Benign essential HTN    Dysphagia    SAH (subarachnoid hemorrhage) (HCC) 07/02/2016   Intracerebral hemorrhage, intraventricular (HCC) 07/02/2016   Right clavicle fracture 07/02/2016   Fracture of multiple ribs 07/02/2016   Sacral fracture (HCC) 07/02/2016   Chest trauma    Closed fracture of one rib    Urinary retention    Pneumonia of right lower lobe due to methicillin susceptible Staphylococcus aureus (MSSA) (HCC)    Prerenal azotemia    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,  sequela (HCC)    Fracture of clavicle, right, closed 06/22/2016   TBI (traumatic brain injury) (HCC) 06/21/2016    PCP: [place], Sarah T  REFERRING PROVIDER: Ranelle Oyster, MD   REFERRING DIAG: Diagnosis S06.2X3S (ICD-10-CM) - 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   PERTINENT HISTORY:  TBI, clavicular fracture, thoracic compression fracture.  PAIN:  Are you having pain? No  PRECAUTIONS: None  WEIGHT BEARING RESTRICTIONS No  FALLS:  Has patient fallen in last 6 months? No  LIVING ENVIRONMENT: Lives with: lives with their spouse Lives in: House/apartment Stairs: Yes: External: 3 steps; on right going up Has following equipment at home: None  OCCUPATION: retired  PLOF: Independent  PATIENT GOALS Decrease pain.  OBJECTIVE:   DIAGNOSTIC FINDINGS:  No xray, MRI     POSTURE: rounded shoulders, forward head, and R shoulder elevated, R scapula elevated and upwardly rotated   CERVICAL ROM:   Active ROM A/PROM (deg) eval  Flexion 100%  Extension 30%  Right lateral flexion 30%  Left lateral flexion 50%  Right rotation 80%  Left rotation 80%   (Blank rows = not tested)  UPPER EXTREMITY ROM:WFL B  UPPER EXTREMITY MMT: WNL B, however, with severe spasms in shoulders, anticipate he may have some weakness once the scapulae have returned to a normal resting position.   TODAY'S TREATMENT:  08/21/21 UBE L2 x 6 min, 3 mins forwards/backwards 2# WaTE  flexion/extension, IR at 3# x10 each R shoulder abduction with 2# 2x10 Rows/ER with red TB 2x10 STM to right UT  08/09/21 UBE L1 x 3 min some increase in pain in the R shoulder STM to cervical and shoulder musculature, PROM for R scapula and shoulder 2lb WaTE bilat Flex, Ext, IR x10 ER yellow 2x10  Rows and Ext green 2x10 Eval STM to cervical and shoulder musculature, PROM for R scapula   PATIENT EDUCATION:  Education details: POC, HEP Person educated: Patient and Spouse Education method: Explanation, Demonstration, and Handouts Education comprehension: verbalized understanding and returned demonstration   HOME EXERCISE PROGRAM:  [id number]  ASSESSMENT:  CLINICAL IMPRESSION: Pt was 7 minutes late. Pt enters today in no pain. Does note some tightness in left UT after using UBE, verbal cues to relax shoulders and for upright posture throughout session. Pt very tight in UT with improvement with STM. Would benefit from additional strengthening and stretching.   OBJECTIVE IMPAIRMENTS decreased activity tolerance, decreased mobility, decreased ROM, decreased strength, hypomobility, increased fascial restrictions, increased muscle spasms, impaired flexibility, impaired tone, impaired UE functional use, improper body mechanics, postural dysfunction, and pain.   ACTIVITY LIMITATIONS carrying, lifting, sleeping, and reach over head  PARTICIPATION LIMITATIONS: cleaning, laundry, and yard work  PERSONAL FACTORS Past/current experiences, Time since onset of injury/illness/exacerbation, and 1-2 comorbidities: TBI and Thoracic compression fracture in the past  are also affecting patient's functional outcome.   REHAB POTENTIAL: Good  CLINICAL DECISION MAKING: Stable/uncomplicated  EVALUATION COMPLEXITY: Moderate   GOALS: Goals reviewed with patient? Yes  SHORT TERM GOALS: Target date: 09/18/2021   Patient will be I with basic HEP Baseline: provided. Goal status: INITIAL  LONG TERM  GOALS: Target date: 10/30/2021  I with final HEP Baseline:  Goal status: INITIAL  2.  Patient will stand with normalized posture, including B scapulae resting in normal position, minimized rounded shoulders. Baseline: severe rounded shoulders, R scapula elevated and upwrdly rotated. Goal status: INITIAL  3.  Paitent will report no more than 2/10 pain at any time throughout his daily activities. Baseline: up to 6/10 Goal status: INITIAL  4.  Patient will demonstrate normal scapulo-humeral rhythm during R shoulder elevation. Baseline: R shoulder held in elevation and upward rotation. Goal status: INITIAL  5.  Patient will demosntrate normal muscle tone in R  upper quadrant. Baseline: severe spasms in entire R upper quadrant. Goal status: INITIAL  PLAN: PT FREQUENCY: 2x/week  PT DURATION: 10 weeks  PLANNED INTERVENTIONS: Therapeutic exercises, Therapeutic activity, Neuromuscular re-education, Balance training, Gait training, Patient/Family education, Joint mobilization, Dry Needling, Electrical stimulation, Cryotherapy, Moist heat, Taping, Vasopneumatic device, Ultrasound, Ionotophoresis 4mg /ml Dexamethasone, and Manual therapy  PLAN FOR NEXT SESSION: UE strengthening   Demont Linford, SPTA 08/21/2021, 11:57 AM

## 2021-08-23 ENCOUNTER — Encounter: Payer: Self-pay | Admitting: Physical Therapy

## 2021-08-23 ENCOUNTER — Ambulatory Visit: Payer: Medicare PPO | Admitting: Physical Therapy

## 2021-08-23 DIAGNOSIS — G8929 Other chronic pain: Secondary | ICD-10-CM

## 2021-08-23 DIAGNOSIS — M25511 Pain in right shoulder: Secondary | ICD-10-CM | POA: Diagnosis not present

## 2021-08-23 DIAGNOSIS — R278 Other lack of coordination: Secondary | ICD-10-CM

## 2021-08-23 DIAGNOSIS — R600 Localized edema: Secondary | ICD-10-CM

## 2021-08-23 DIAGNOSIS — R293 Abnormal posture: Secondary | ICD-10-CM

## 2021-08-23 NOTE — Therapy (Signed)
OUTPATIENT PHYSICAL THERAPY CERVICAL EVALUATION   Patient Name: Tyler Lane MRN: [id number] DOB:1950-04-07, 71 y.o., male Today's Date: 08/23/2021   PT End of Session - 08/23/21 1207     Visit Number 4    Date for PT Re-Evaluation 10/17/21    PT Start Time 1153    PT Stop Time 1226    PT Time Calculation (min) 33 min    Activity Tolerance Patient tolerated treatment well    Behavior During Therapy [facility] for tasks assessed/performed              Past Medical History:  Diagnosis Date   Back injury 01/16/2020   Depression    Heart murmur    "closed"   Hepatitis    hepatatis C - treated    High cholesterol    HOH (hard of hearing)    mild in the right and moderate to severe in the left   Hypertension    Pneumonia    SAH (subarachnoid hemorrhage) (HCC) 2018   TBI (traumatic brain injury) (HCC) 2018   Tinnitus of left ear    Past Surgical History:  Procedure Laterality Date   KYPHOPLASTY N/A 04/15/2020   Procedure: KYPHOPLASTY Thoracic twelve;  Surgeon: Maeola Harman, MD;  Location: [facility] OR;  Service: Neurosurgery;  Laterality: N/A;   NASAL SINUS SURGERY     Patient Active Problem List   Diagnosis Date Noted   Myofascial neck pain 06/28/2021   Thoracic vertebral fracture (HCC) 04/15/2020   Thoracic compression fracture, sequela 03/02/2020   Fatigue 08/26/2017   Primary insomnia 08/26/2017   Diplopia 12/17/2016   Sleep disorder    Hyperglycemia    Benign essential HTN    Dysphagia    SAH (subarachnoid hemorrhage) (HCC) 07/02/2016   Intracerebral hemorrhage, intraventricular (HCC) 07/02/2016   Right clavicle fracture 07/02/2016   Fracture of multiple ribs 07/02/2016   Sacral fracture (HCC) 07/02/2016   Chest trauma    Closed fracture of one rib    Urinary retention    Pneumonia of right lower lobe due to methicillin susceptible Staphylococcus aureus (MSSA) (HCC)    Prerenal azotemia    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,  sequela (HCC)    Fracture of clavicle, right, closed 06/22/2016   TBI (traumatic brain injury) (HCC) 06/21/2016    PCP: [place], Sarah T  REFERRING PROVIDER: Ranelle Oyster, MD   REFERRING DIAG: Diagnosis S06.2X3S (ICD-10-CM) - 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 He does not like cold air, it exacerbates the pain.  PERTINENT HISTORY:  TBI, clavicular fracture, thoracic compression fracture.  PAIN:  Are you having pain? No  PRECAUTIONS: None  WEIGHT BEARING RESTRICTIONS No  FALLS:  Has patient fallen in last 6 months? No  LIVING ENVIRONMENT: Lives with: lives with their spouse Lives in: House/apartment Stairs: Yes: External: 3 steps; on right going up Has following equipment at home: None  OCCUPATION: retired  PLOF: Independent  PATIENT GOALS Decrease pain.  OBJECTIVE:   DIAGNOSTIC FINDINGS:  No xray, MRI  POSTURE: rounded shoulders, forward head, and R shoulder elevated, R scapula elevated and upwardly rotated   CERVICAL ROM:   Active ROM A/PROM (deg) eval  Flexion 100%  Extension 30%  Right lateral flexion 30%  Left lateral flexion 50%  Right rotation 80%  Left rotation 80%   (Blank rows = not tested)  UPPER EXTREMITY ROM:WFL B  UPPER EXTREMITY MMT: WNL B, however, with severe spasms in shoulders, anticipate he may have some weakness once the scapulae have returned to a normal  resting position.   TODAY'S TREATMENT:  08/23/21 Nu-Step L3 x 6 minutes with U and LE, emphasize  upper body movement for warm up Scapular stabilization- Rows, Sh Ext, ER, 2 x 10 reps each, Gr Tband resistance. STM to UP Traps, scalenes, SCM on R. Paitent very tight and tender, scapular mobilization into depression.   08/21/21 UBE L2 x 6 min, 3 mins forwards/backwards 2# WaTE flexion/extension, IR at 3# x10 each R shoulder abduction with 2# 2x10 Rows/ER with red TB 2x10 STM to right UT  08/09/21 UBE L1 x 3 min some increase in pain in the R shoulder STM to cervical and shoulder musculature, PROM for R scapula and shoulder 2lb WaTE bilat Flex, Ext, IR x10 ER yellow 2x10  Rows and Ext green 2x10 Eval STM to cervical and shoulder musculature, PROM for R scapula   PATIENT EDUCATION:  Education details: POC, HEP Person educated: Patient and Spouse Education method: Explanation, Demonstration, and Handouts Education comprehension: verbalized understanding and returned demonstration   HOME EXERCISE PROGRAM:  [id number]  ASSESSMENT:  CLINICAL IMPRESSION: Pt arrived a few minutes late again. Perforemd warm up, then added strengthening exercises to his HEP, performing each with min VC. STM and stretch. Paiten tvery TTP along spasms.  OBJECTIVE IMPAIRMENTS decreased activity tolerance, decreased mobility, decreased ROM, decreased strength, hypomobility, increased fascial restrictions, increased muscle spasms, impaired flexibility, impaired tone, impaired UE functional use, improper body mechanics, postural dysfunction, and pain.   ACTIVITY LIMITATIONS carrying, lifting, sleeping, and reach over head  PARTICIPATION LIMITATIONS: cleaning, laundry, and yard work  PERSONAL FACTORS Past/current experiences, Time since onset of injury/illness/exacerbation, and 1-2 comorbidities: TBI and Thoracic compression fracture in the past  are also affecting patient's functional outcome.   REHAB  POTENTIAL: Good  CLINICAL DECISION MAKING: Stable/uncomplicated  EVALUATION COMPLEXITY: Moderate   GOALS: Goals reviewed with patient? Yes  SHORT TERM GOALS: Target date: 09/02/21   Patient will be I with basic HEP Baseline: provided. Goal status: ongoing  LONG TERM GOALS: Target date: 10/17/21  I with final HEP Baseline:  Goal status: INITIAL  2.  Patient will stand with normalized posture, including B scapulae resting in normal position, minimized rounded shoulders. Baseline: severe rounded shoulders, R scapula elevated and upwrdly rotated. Goal status: INITIAL  3.  Paitent will report no more than 2/10 pain at any time throughout his daily activities.  Baseline: up to 6/10 Goal status: INITIAL  4.  Patient will demonstrate normal scapulo-humeral rhythm during R shoulder elevation. Baseline: R shoulder held in elevation and upward rotation. Goal status: INITIAL  5.  Patient will demosntrate normal muscle tone in R upper quadrant. Baseline: severe spasms in entire R upper quadrant. Goal status: INITIAL  PLAN: PT FREQUENCY: 2x/week  PT DURATION: 10 weeks  PLANNED INTERVENTIONS: Therapeutic exercises, Therapeutic activity, Neuromuscular re-education, Balance training, Gait training, Patient/Family education, Joint mobilization, Dry Needling, Electrical stimulation, Cryotherapy, Moist heat, Taping, Vasopneumatic device, Ultrasound, Ionotophoresis 4mg /ml Dexamethasone, and Manual therapy  PLAN FOR NEXT SESSION: DN or some additional mobility for R upper quadrant tightness.   DPT 08/23/21 12:32 PM  08/23/2021, 12:32 PM

## 2021-08-28 ENCOUNTER — Encounter: Payer: Self-pay | Admitting: Physical Therapy

## 2021-08-28 ENCOUNTER — Ambulatory Visit: Payer: Medicare PPO | Admitting: Physical Therapy

## 2021-08-28 DIAGNOSIS — R278 Other lack of coordination: Secondary | ICD-10-CM

## 2021-08-28 DIAGNOSIS — R600 Localized edema: Secondary | ICD-10-CM

## 2021-08-28 DIAGNOSIS — G8929 Other chronic pain: Secondary | ICD-10-CM

## 2021-08-28 DIAGNOSIS — M25511 Pain in right shoulder: Secondary | ICD-10-CM | POA: Diagnosis not present

## 2021-08-28 DIAGNOSIS — R293 Abnormal posture: Secondary | ICD-10-CM

## 2021-08-28 NOTE — Therapy (Signed)
OUTPATIENT PHYSICAL THERAPY CERVICAL EVALUATION   Patient Name: Tyler Lane MRN: [id number] DOB:1950-10-16, 71 y.o., male Today's Date: 08/28/2021   PT End of Session - 08/28/21 1159     Visit Number 5    Date for PT Re-Evaluation 10/17/21    PT Start Time 1159    PT Stop Time 1230    PT Time Calculation (min) 31 min    Activity Tolerance Patient tolerated treatment well    Behavior During Therapy [facility] for tasks assessed/performed              Past Medical History:  Diagnosis Date   Back injury 01/16/2020   Depression    Heart murmur    "closed"   Hepatitis    hepatatis C - treated    High cholesterol    HOH (hard of hearing)    mild in the right and moderate to severe in the left   Hypertension    Pneumonia    SAH (subarachnoid hemorrhage) (HCC) 2018   TBI (traumatic brain injury) (HCC) 2018   Tinnitus of left ear    Past Surgical History:  Procedure Laterality Date   KYPHOPLASTY N/A 04/15/2020   Procedure: KYPHOPLASTY Thoracic twelve;  Surgeon: Maeola Harman, MD;  Location: [facility] OR;  Service: Neurosurgery;  Laterality: N/A;   NASAL SINUS SURGERY     Patient Active Problem List   Diagnosis Date Noted   Myofascial neck pain 06/28/2021   Thoracic vertebral fracture (HCC) 04/15/2020   Thoracic compression fracture, sequela 03/02/2020   Fatigue 08/26/2017   Primary insomnia 08/26/2017   Diplopia 12/17/2016   Sleep disorder    Hyperglycemia    Benign essential HTN    Dysphagia    SAH (subarachnoid hemorrhage) (HCC) 07/02/2016   Intracerebral hemorrhage, intraventricular (HCC) 07/02/2016   Right clavicle fracture 07/02/2016   Fracture of multiple ribs 07/02/2016   Sacral fracture (HCC) 07/02/2016   Chest trauma    Closed fracture of one rib    Urinary retention    Pneumonia of right lower lobe due to methicillin susceptible Staphylococcus aureus (MSSA) (HCC)    Prerenal azotemia    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,  sequela (HCC)    Fracture of clavicle, right, closed 06/22/2016   TBI (traumatic brain injury) (HCC) 06/21/2016    PCP: [place], Sarah T  REFERRING PROVIDER: Ranelle Oyster, MD   REFERRING DIAG: Diagnosis S06.2X3S (ICD-10-CM) - 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, sequela (HCC) M54.2 (ICD-10-CM) - Myofascial neck pain   THERAPY DIAG:  Chronic right shoulder pain  Localized edema  Abnormal posture  Other lack of coordination  Rationale for Evaluation and Treatment Rehabilitation  ONSET DATE: 06/28/2021   SUBJECTIVE:  SUBJECTIVE STATEMENT: I'm feeling okay, no pain.   PERTINENT HISTORY:  TBI, clavicular fracture, thoracic compression fracture.  PAIN:  Are you having pain? No  PRECAUTIONS: None  WEIGHT BEARING RESTRICTIONS No  FALLS:  Has patient fallen in last 6 months? No  LIVING ENVIRONMENT: Lives with: lives with their spouse Lives in: House/apartment Stairs: Yes: External: 3 steps; on right going up Has following equipment at home: None  OCCUPATION: retired  PLOF: Independent  PATIENT GOALS Decrease pain.  OBJECTIVE:   DIAGNOSTIC FINDINGS:  No xray, MRI  POSTURE: rounded shoulders, forward head, and R shoulder elevated, R scapula elevated and upwardly rotated   CERVICAL ROM:   Active ROM A/PROM (deg) eval  Flexion 100%  Extension 30%  Right lateral flexion 30%  Left lateral flexion 50%  Right rotation 80%  Left rotation 80%   (Blank rows = not tested)  UPPER EXTREMITY ROM:WFL B  UPPER EXTREMITY MMT: WNL B, however, with severe spasms in shoulders, anticipate he may have some weakness once the scapulae have returned to a normal resting position.   TODAY'S TREATMENT:  08/28/21 UBE L2 x 6 min, 3 mins  backwards/forwards 3# WaTE bar shoulder flexion, extension, IR x10 each Rows/lat pull down 35# 2x10 Shoulder extension 10# 2x10 Tricep extension 35# 2x10  08/23/21 Nu-Step L3 x 6 minutes with U and LE, emphasize  upper body movement for warm up Scapular stabilization- Rows, Sh Ext, ER, 2 x 10 reps each, Gr Tband resistance. STM to UP Traps, scalenes, SCM on R. Paitent very tight and tender, scapular mobilization into depression.   08/21/21 UBE L2 x 6 min, 3 mins forwards/backwards 2# WaTE flexion/extension, IR at 3# x10 each R shoulder abduction with 2# 2x10 Rows/ER with red TB 2x10 STM to right UT  08/09/21 UBE L1 x 3 min some increase in pain in the R shoulder STM to cervical and shoulder musculature, PROM for R scapula and shoulder 2lb WaTE bilat Flex, Ext, IR x10 ER yellow 2x10  Rows and Ext green 2x10 Eval STM to cervical and shoulder musculature, PROM for R scapula   PATIENT EDUCATION:  Education details: POC, HEP Person educated: Patient and Spouse Education method: Explanation, Demonstration, and Handouts Education comprehension: verbalized understanding and returned demonstration   HOME EXERCISE PROGRAM:  [id number]  ASSESSMENT:  CLINICAL IMPRESSION: Pt enters 14 minutes late today. No complaints of pain. We progressed to some machine based strengthening exercises, which pt tolerated well. Verbal cues to maintain upright posture throughout session. Will benefit from additional posture strengthening.   OBJECTIVE IMPAIRMENTS decreased activity tolerance, decreased mobility, decreased ROM, decreased strength, hypomobility, increased fascial restrictions, increased muscle spasms, impaired flexibility, impaired tone, impaired UE functional use, improper body mechanics, postural dysfunction, and pain.   ACTIVITY LIMITATIONS carrying, lifting, sleeping, and reach over head  PARTICIPATION LIMITATIONS: cleaning, laundry, and yard work  PERSONAL FACTORS Past/current  experiences, Time since onset of injury/illness/exacerbation, and 1-2 comorbidities: TBI and Thoracic compression fracture in the past  are also affecting patient's functional outcome.   REHAB POTENTIAL: Good  CLINICAL DECISION MAKING: Stable/uncomplicated  EVALUATION COMPLEXITY: Moderate   GOALS: Goals reviewed with patient? Yes  SHORT TERM GOALS: Target date: 09/02/21   Patient will be I with basic HEP Baseline: provided. Goal status: ongoing  LONG TERM GOALS: Target date: 10/17/21  I with final HEP Baseline:  Goal status: INITIAL  2.  Patient will stand with normalized posture, including B scapulae resting in normal position, minimized rounded shoulders. Baseline: severe rounded shoulders,  R scapula elevated and upwrdly rotated. Goal status: INITIAL  3.  Paitent will report no more than 2/10 pain at any time throughout his daily activities. Baseline: up to 6/10 Goal status: INITIAL  4.  Patient will demonstrate normal scapulo-humeral rhythm during R shoulder elevation. Baseline: R shoulder held in elevation and upward rotation. Goal status: INITIAL  5.  Patient will demosntrate normal muscle tone in R upper quadrant. Baseline: severe spasms in entire R upper quadrant. Goal status: INITIAL  PLAN: PT FREQUENCY: 2x/week  PT DURATION: 10 weeks  PLANNED INTERVENTIONS: Therapeutic exercises, Therapeutic activity, Neuromuscular re-education, Balance training, Gait training, Patient/Family education, Joint mobilization, Dry Needling, Electrical stimulation, Cryotherapy, Moist heat, Taping, Vasopneumatic device, Ultrasound, Ionotophoresis 4mg /ml Dexamethasone, and Manual therapy  PLAN FOR NEXT SESSION: Additional postural strengthening and stretching.    [place], SPTA 08/28/21 12:01 PM  08/28/2021, 12:01 PM

## 2021-08-30 ENCOUNTER — Encounter: Payer: Self-pay | Admitting: Physical Therapy

## 2021-08-30 ENCOUNTER — Ambulatory Visit: Payer: Medicare PPO | Admitting: Physical Therapy

## 2021-08-30 DIAGNOSIS — R278 Other lack of coordination: Secondary | ICD-10-CM

## 2021-08-30 DIAGNOSIS — R600 Localized edema: Secondary | ICD-10-CM

## 2021-08-30 DIAGNOSIS — G8929 Other chronic pain: Secondary | ICD-10-CM

## 2021-08-30 DIAGNOSIS — M25511 Pain in right shoulder: Secondary | ICD-10-CM | POA: Diagnosis not present

## 2021-08-30 DIAGNOSIS — R293 Abnormal posture: Secondary | ICD-10-CM

## 2021-08-30 NOTE — Therapy (Signed)
OUTPATIENT PHYSICAL THERAPY CERVICAL EVALUATION   Patient Name: Tyler Lane MRN: [id number] DOB:Jul 09, 1950, 71 y.o., male Today's Date: 08/30/2021   PT End of Session - 08/30/21 1152     Visit Number 6    Date for PT Re-Evaluation 10/17/21    PT Start Time 1147    PT Stop Time 1225    PT Time Calculation (min) 38 min    Activity Tolerance Patient tolerated treatment well    Behavior During Therapy [facility] for tasks assessed/performed               Past Medical History:  Diagnosis Date   Back injury 01/16/2020   Depression    Heart murmur    "closed"   Hepatitis    hepatatis C - treated    High cholesterol    HOH (hard of hearing)    mild in the right and moderate to severe in the left   Hypertension    Pneumonia    SAH (subarachnoid hemorrhage) (HCC) 2018   TBI (traumatic brain injury) (HCC) 2018   Tinnitus of left ear    Past Surgical History:  Procedure Laterality Date   KYPHOPLASTY N/A 04/15/2020   Procedure: KYPHOPLASTY Thoracic twelve;  Surgeon: Maeola Harman, MD;  Location: [facility] OR;  Service: Neurosurgery;  Laterality: N/A;   NASAL SINUS SURGERY     Patient Active Problem List   Diagnosis Date Noted   Myofascial neck pain 06/28/2021   Thoracic vertebral fracture (HCC) 04/15/2020   Thoracic compression fracture, sequela 03/02/2020   Fatigue 08/26/2017   Primary insomnia 08/26/2017   Diplopia 12/17/2016   Sleep disorder    Hyperglycemia    Benign essential HTN    Dysphagia    SAH (subarachnoid hemorrhage) (HCC) 07/02/2016   Intracerebral hemorrhage, intraventricular (HCC) 07/02/2016   Right clavicle fracture 07/02/2016   Fracture of multiple ribs 07/02/2016   Sacral fracture (HCC) 07/02/2016   Chest trauma    Closed fracture of one rib    Urinary retention    Pneumonia of right lower lobe due to methicillin susceptible Staphylococcus aureus (MSSA) (HCC)    Prerenal azotemia    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,  sequela (HCC)    Fracture of clavicle, right, closed 06/22/2016   TBI (traumatic brain injury) (HCC) 06/21/2016    PCP: [place], Sarah T  REFERRING PROVIDER: Ranelle Oyster, MD   REFERRING DIAG: Diagnosis S06.2X3S (ICD-10-CM) - 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, sequela (HCC) M54.2 (ICD-10-CM) - Myofascial neck pain   THERAPY DIAG:  Chronic right shoulder pain  Localized edema  Abnormal posture  Other lack of coordination  Rationale for Evaluation and Treatment Rehabilitation  ONSET DATE: 06/28/2021   SUBJECTIVE:  SUBJECTIVE STATEMENT: Patient reports pain in mid back and R shoulder and side. It feels like muscle pain. He thinks the weights may have been a bit heavy, but didn't say anything during his last treatment.  PERTINENT HISTORY:  TBI, clavicular fracture, thoracic compression fracture.  PAIN:  Are you having pain? Yes 3/10  PRECAUTIONS: None  WEIGHT BEARING RESTRICTIONS No  FALLS:  Has patient fallen in last 6 months? No  LIVING ENVIRONMENT: Lives with: lives with their spouse Lives in: House/apartment Stairs: Yes: External: 3 steps; on right going up Has following equipment at home: None  OCCUPATION: retired  PLOF: Independent  PATIENT GOALS Decrease pain.  OBJECTIVE:   DIAGNOSTIC FINDINGS:  No xray, MRI  POSTURE: rounded shoulders, forward head, and R shoulder elevated, R scapula elevated and upwardly rotated   CERVICAL ROM:   Active ROM A/PROM (deg) eval  Flexion 100%  Extension 30%  Right lateral flexion 30%  Left lateral flexion 50%  Right rotation 80%  Left rotation 80%   (Blank rows = not tested)  UPPER EXTREMITY ROM:WFL B  UPPER EXTREMITY MMT: WNL B, however, with severe spasms in shoulders,  anticipate he may have some weakness once the scapulae have returned to a normal resting position.   TODAY'S TREATMENT:  08/30/21 UBE L2 x 3 min for and back STM to B cervical paraspinals, Bup Traps, emphasizing R, B scalenes and R SCM, with stretch. R scapular mobilizations into abd/depression, neural glides on R. Seated lower body weight shifts over planted hands, therapist facilitating postural changes for stretch, repeated to each side, 4 reps. Seated on physiodisc, lower trunk weight shifts, ant/post, B , and clockface x 2 in each direction. Pendulum exercises with 6# weight, B lateral and forward/back swaying.  08/28/21 UBE L2 x 6 min, 3 mins backwards/forwards 3# WaTE bar shoulder flexion, extension, IR x10 each Rows/lat pull down 35# 2x10 Shoulder extension 10# 2x10 Tricep extension 35# 2x10  08/23/21 Nu-Step L3 x 6 minutes with U and LE, emphasize  upper body movement for warm up Scapular stabilization- Rows, Sh Ext, ER, 2 x 10 reps each, Gr Tband resistance. STM to UP Traps, scalenes, SCM on R. Paitent very tight and tender, scapular mobilization into depression.   08/21/21 UBE L2 x 6 min, 3 mins forwards/backwards 2# WaTE flexion/extension, IR at 3# x10 each R shoulder abduction with 2# 2x10 Rows/ER with red TB 2x10 STM to right UT  08/09/21 UBE L1 x 3 min some increase in pain in the R shoulder STM to cervical and shoulder musculature, PROM for R scapula and shoulder 2lb WaTE bilat Flex, Ext, IR x10 ER yellow 2x10  Rows and Ext green 2x10 Eval STM to cervical and shoulder musculature, PROM for R scapula   PATIENT EDUCATION:  Education details: POC, HEP Person educated: Patient and Spouse Education method: Explanation, Demonstration, and Handouts Education comprehension: verbalized understanding and returned demonstration   HOME EXERCISE PROGRAM:  [id number]  ASSESSMENT:  CLINICAL IMPRESSION: Pt reports he is very sore after last treatment. He feels the  weights may have been too heavy, but he did not say anything at the time. Encouraged patient to speak up if any activity is too challenging. Treatment focused on soft tissue work, with stretch and weight shifts. Plan to return to strengthening on next visist. He did report some relief after treatment.  OBJECTIVE IMPAIRMENTS decreased activity tolerance, decreased mobility, decreased ROM, decreased strength, hypomobility, increased fascial restrictions, increased muscle spasms, impaired flexibility, impaired tone, impaired UE functional  use, improper body mechanics, postural dysfunction, and pain.   ACTIVITY LIMITATIONS carrying, lifting, sleeping, and reach over head  PARTICIPATION LIMITATIONS: cleaning, laundry, and yard work  PERSONAL FACTORS Past/current experiences, Time since onset of injury/illness/exacerbation, and 1-2 comorbidities: TBI and Thoracic compression fracture in the past  are also affecting patient's functional outcome.   REHAB POTENTIAL: Good  CLINICAL DECISION MAKING: Stable/uncomplicated  EVALUATION COMPLEXITY: Moderate   GOALS: Goals reviewed with patient? Yes  SHORT TERM GOALS: Target date: 09/02/21   Patient will be I with basic HEP Baseline: provided. Goal status: ongoing  LONG TERM GOALS: Target date: 10/17/21  I with final HEP Baseline:  Goal status: INITIAL  2.  Patient will stand with normalized posture, including B scapulae resting in normal position, minimized rounded shoulders. Baseline: severe rounded shoulders, R scapula elevated and upwrdly rotated. Goal status: ongoing  3.  Paitent will report no more than 2/10 pain at any time throughout his daily activities. Baseline: up to 6/10 Goal status: ongoing  4.  Patient will demonstrate normal scapulo-humeral rhythm during R shoulder elevation. Baseline: R shoulder held in elevation and upward rotation. Goal status: INITIAL  5.  Patient will demosntrate normal muscle tone in R upper  quadrant. Baseline: severe spasms in entire R upper quadrant. Goal status: INITIAL  PLAN: PT FREQUENCY: 2x/week  PT DURATION: 10 weeks  PLANNED INTERVENTIONS: Therapeutic exercises, Therapeutic activity, Neuromuscular re-education, Balance training, Gait training, Patient/Family education, Joint mobilization, Dry Needling, Electrical stimulation, Cryotherapy, Moist heat, Taping, Vasopneumatic device, Ultrasound, Ionotophoresis 4mg /ml Dexamethasone, and Manual therapy  PLAN FOR NEXT SESSION: Additional postural strengthening and stretching.    Ethel Rana DPT 08/30/21 12:31 PM

## 2021-09-04 ENCOUNTER — Encounter: Payer: Self-pay | Admitting: Physical Therapy

## 2021-09-04 ENCOUNTER — Ambulatory Visit: Payer: Medicare PPO | Admitting: Physical Therapy

## 2021-09-04 DIAGNOSIS — G8929 Other chronic pain: Secondary | ICD-10-CM

## 2021-09-04 DIAGNOSIS — M25511 Pain in right shoulder: Secondary | ICD-10-CM | POA: Diagnosis not present

## 2021-09-04 DIAGNOSIS — R252 Cramp and spasm: Secondary | ICD-10-CM

## 2021-09-04 DIAGNOSIS — R600 Localized edema: Secondary | ICD-10-CM

## 2021-09-04 DIAGNOSIS — R293 Abnormal posture: Secondary | ICD-10-CM

## 2021-09-04 DIAGNOSIS — R278 Other lack of coordination: Secondary | ICD-10-CM

## 2021-09-04 NOTE — Therapy (Signed)
OUTPATIENT PHYSICAL THERAPY CERVICAL EVALUATION   Patient Name: Tyler Lane MRN: [id number] DOB:1950/07/27, 71 y.o., male Today's Date: 09/04/2021   PT End of Session - 09/04/21 1401     Visit Number 7    Date for PT Re-Evaluation 10/17/21    PT Start Time 1358    PT Stop Time 1444    PT Time Calculation (min) 46 min    Activity Tolerance Patient tolerated treatment well    Behavior During Therapy [facility] for tasks assessed/performed               Past Medical History:  Diagnosis Date   Back injury 01/16/2020   Depression    Heart murmur    "closed"   Hepatitis    hepatatis C - treated    High cholesterol    HOH (hard of hearing)    mild in the right and moderate to severe in the left   Hypertension    Pneumonia    SAH (subarachnoid hemorrhage) (HCC) 2018   TBI (traumatic brain injury) (HCC) 2018   Tinnitus of left ear    Past Surgical History:  Procedure Laterality Date   KYPHOPLASTY N/A 04/15/2020   Procedure: KYPHOPLASTY Thoracic twelve;  Surgeon: Maeola Harman, MD;  Location: [facility] OR;  Service: Neurosurgery;  Laterality: N/A;   NASAL SINUS SURGERY     Patient Active Problem List   Diagnosis Date Noted   Myofascial neck pain 06/28/2021   Thoracic vertebral fracture (HCC) 04/15/2020   Thoracic compression fracture, sequela 03/02/2020   Fatigue 08/26/2017   Primary insomnia 08/26/2017   Diplopia 12/17/2016   Sleep disorder    Hyperglycemia    Benign essential HTN    Dysphagia    SAH (subarachnoid hemorrhage) (HCC) 07/02/2016   Intracerebral hemorrhage, intraventricular (HCC) 07/02/2016   Right clavicle fracture 07/02/2016   Fracture of multiple ribs 07/02/2016   Sacral fracture (HCC) 07/02/2016   Chest trauma    Closed fracture of one rib    Urinary retention    Pneumonia of right lower lobe due to methicillin susceptible Staphylococcus aureus (MSSA) (HCC)    Prerenal azotemia    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,  sequela (HCC)    Fracture of clavicle, right, closed 06/22/2016   TBI (traumatic brain injury) (HCC) 06/21/2016    PCP: [place], Sarah T  REFERRING PROVIDER: Ranelle Oyster, MD   REFERRING DIAG: Diagnosis S06.2X3S (ICD-10-CM) - 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, sequela (HCC) M54.2 (ICD-10-CM) - Myofascial neck pain   THERAPY DIAG:  Chronic right shoulder pain  Localized edema  Abnormal posture  Other lack of coordination  Cramp and spasm  Rationale for Evaluation and Treatment Rehabilitation  ONSET DATE: 06/28/2021   SUBJECTIVE:  SUBJECTIVE STATEMENT: Patient continues to report some pain and soreness from the heavier weights two treatments ago.    PERTINENT HISTORY:  TBI, clavicular fracture, thoracic compression fracture.  PAIN:  Are you having pain? Yes 3/10 shoulders and neck area feels stiff and tight  PRECAUTIONS: None  WEIGHT BEARING RESTRICTIONS No  FALLS:  Has patient fallen in last 6 months? No  LIVING ENVIRONMENT: Lives with: lives with their spouse Lives in: House/apartment Stairs: Yes: External: 3 steps; on right going up Has following equipment at home: None  OCCUPATION: retired  PLOF: Independent  PATIENT GOALS Decrease pain.  OBJECTIVE:   DIAGNOSTIC FINDINGS:  No xray, MRI  POSTURE: rounded shoulders, forward head, and R shoulder elevated, R scapula elevated and upwardly rotated   CERVICAL ROM:   Active ROM A/PROM (deg) eval  Flexion 100%  Extension 30%  Right lateral flexion 30%  Left lateral flexion 50%  Right rotation 80%  Left rotation 80%   (Blank rows = not tested)  UPPER EXTREMITY ROM:WFL B  UPPER EXTREMITY MMT: WNL B, however, with severe spasms in shoulders, anticipate he may have  some weakness once the scapulae have returned to a normal resting position.   TODAY'S TREATMENT:  09/04/21 UBE Level 3 x 6 minutes Seated rows and lats 20# 2x10 Feet on ball K2C, trunk rotation, small bridges and isometric abs Passive ROM of the LE's STM with patient in sidelying, use of vibration to the lumbar and into the thoracic mms    08/30/21 UBE L2 x 3 min for and back STM to B cervical paraspinals, Bup Traps, emphasizing R, B scalenes and R SCM, with stretch. R scapular mobilizations into abd/depression, neural glides on R. Seated lower body weight shifts over planted hands, therapist facilitating postural changes for stretch, repeated to each side, 4 reps. Seated on physiodisc, lower trunk weight shifts, ant/post, B , and clockface x 2 in each direction. Pendulum exercises with 6# weight, B lateral and forward/back swaying.  08/28/21 UBE L2 x 6 min, 3 mins backwards/forwards 3# WaTE bar shoulder flexion, extension, IR x10 each Rows/lat pull down 35# 2x10 Shoulder extension 10# 2x10 Tricep extension 35# 2x10    PATIENT EDUCATION:  Education details: POC, HEP Person educated: Patient and Spouse Education method: Explanation, Demonstration, and Handouts Education comprehension: verbalized understanding and returned demonstration   HOME EXERCISE PROGRAM:  [id number]  ASSESSMENT:  CLINICAL IMPRESSION: I went back to some of the exercises but with less weight and tried to get him to use good form.  Needed a lot of verbal and tactile cues to do correctly, I did some easy back exercises to loosen him up, again some cues needed for the correct form and exercise, used vibration for the STM he had a few spots that were very tight and tender  OBJECTIVE IMPAIRMENTS decreased activity tolerance, decreased mobility, decreased ROM, decreased strength, hypomobility, increased fascial restrictions, increased muscle spasms, impaired flexibility, impaired tone, impaired UE functional  use, improper body mechanics, postural dysfunction, and pain.   ACTIVITY LIMITATIONS carrying, lifting, sleeping, and reach over head  PARTICIPATION LIMITATIONS: cleaning, laundry, and yard work  PERSONAL FACTORS Past/current experiences, Time since onset of injury/illness/exacerbation, and 1-2 comorbidities: TBI and Thoracic compression fracture in the past  are also affecting patient's functional outcome.   REHAB POTENTIAL: Good  CLINICAL DECISION MAKING: Stable/uncomplicated  EVALUATION COMPLEXITY: Moderate   GOALS: Goals reviewed with patient? Yes  SHORT TERM GOALS: Target date: 09/02/21   Patient will be I with basic HEP  Baseline: provided. Goal status: ongoing  LONG TERM GOALS: Target date: 10/17/21  I with final HEP Baseline:  Goal status: INITIAL  2.  Patient will stand with normalized posture, including B scapulae resting in normal position, minimized rounded shoulders. Baseline: severe rounded shoulders, R scapula elevated and upwrdly rotated. Goal status: ongoing  3.  Paitent will report no more than 2/10 pain at any time throughout his daily activities. Baseline: up to 6/10 Goal status: ongoing  4.  Patient will demonstrate normal scapulo-humeral rhythm during R shoulder elevation. Baseline: R shoulder held in elevation and upward rotation. Goal status: INITIAL  5.  Patient will demosntrate normal muscle tone in R upper quadrant. Baseline: severe spasms in entire R upper quadrant. Goal status: INITIAL  PLAN: PT FREQUENCY: 2x/week  PT DURATION: 10 weeks  PLANNED INTERVENTIONS: Therapeutic exercises, Therapeutic activity, Neuromuscular re-education, Balance training, Gait training, Patient/Family education, Joint mobilization, Dry Needling, Electrical stimulation, Cryotherapy, Moist heat, Taping, Vasopneumatic device, Ultrasound, Ionotophoresis 4mg /ml Dexamethasone, and Manual therapy  PLAN FOR NEXT SESSION: Additional postural strengthening and stretching.    , PT 09/04/21 2:02 PM

## 2021-09-06 ENCOUNTER — Encounter: Payer: Self-pay | Admitting: Physical Therapy

## 2021-09-06 ENCOUNTER — Ambulatory Visit: Payer: Medicare PPO | Admitting: Physical Therapy

## 2021-09-06 DIAGNOSIS — R600 Localized edema: Secondary | ICD-10-CM

## 2021-09-06 DIAGNOSIS — R293 Abnormal posture: Secondary | ICD-10-CM

## 2021-09-06 DIAGNOSIS — M25511 Pain in right shoulder: Secondary | ICD-10-CM | POA: Diagnosis not present

## 2021-09-06 DIAGNOSIS — G8929 Other chronic pain: Secondary | ICD-10-CM

## 2021-09-06 NOTE — Therapy (Signed)
OUTPATIENT PHYSICAL THERAPY CERVICAL TREATMENT   Patient Name: Tyler Lane MRN: [id number] DOB:1950/06/12, 71 y.o., male Today's Date: 09/06/2021   PT End of Session - 09/06/21 1145     Visit Number 8    Date for PT Re-Evaluation 10/17/21    PT Start Time 1145    PT Stop Time 1230    PT Time Calculation (min) 45 min    Activity Tolerance Patient tolerated treatment well    Behavior During Therapy [facility] for tasks assessed/performed               Past Medical History:  Diagnosis Date   Back injury 01/16/2020   Depression    Heart murmur    "closed"   Hepatitis    hepatatis C - treated    High cholesterol    HOH (hard of hearing)    mild in the right and moderate to severe in the left   Hypertension    Pneumonia    SAH (subarachnoid hemorrhage) (HCC) 2018   TBI (traumatic brain injury) (HCC) 2018   Tinnitus of left ear    Past Surgical History:  Procedure Laterality Date   KYPHOPLASTY N/A 04/15/2020   Procedure: KYPHOPLASTY Thoracic twelve;  Surgeon: Maeola Harman, MD;  Location: [facility] OR;  Service: Neurosurgery;  Laterality: N/A;   NASAL SINUS SURGERY     Patient Active Problem List   Diagnosis Date Noted   Myofascial neck pain 06/28/2021   Thoracic vertebral fracture (HCC) 04/15/2020   Thoracic compression fracture, sequela 03/02/2020   Fatigue 08/26/2017   Primary insomnia 08/26/2017   Diplopia 12/17/2016   Sleep disorder    Hyperglycemia    Benign essential HTN    Dysphagia    SAH (subarachnoid hemorrhage) (HCC) 07/02/2016   Intracerebral hemorrhage, intraventricular (HCC) 07/02/2016   Right clavicle fracture 07/02/2016   Fracture of multiple ribs 07/02/2016   Sacral fracture (HCC) 07/02/2016   Chest trauma    Closed fracture of one rib    Urinary retention    Pneumonia of right lower lobe due to methicillin susceptible Staphylococcus aureus (MSSA) (HCC)    Prerenal azotemia    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,  sequela (HCC)    Fracture of clavicle, right, closed 06/22/2016   TBI (traumatic brain injury) (HCC) 06/21/2016    PCP: [place], Sarah T  REFERRING PROVIDER: Ranelle Oyster, MD   REFERRING DIAG: Diagnosis S06.2X3S (ICD-10-CM) - 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, sequela (HCC) M54.2 (ICD-10-CM) - Myofascial neck pain   THERAPY DIAG:  Chronic right shoulder pain  Localized edema  Abnormal posture  Rationale for Evaluation and Treatment Rehabilitation  ONSET DATE: 06/28/2021   SUBJECTIVE:  SUBJECTIVE STATEMENT: Patient continues to report some pain and soreness from the heavier weights two treatments ago.    PERTINENT HISTORY:  TBI, clavicular fracture, thoracic compression fracture.  PAIN:  Are you having pain? Yes 3/10 shoulders and neck area feels stiff and tight  PRECAUTIONS: None  WEIGHT BEARING RESTRICTIONS No  FALLS:  Has patient fallen in last 6 months? No  LIVING ENVIRONMENT: Lives with: lives with their spouse Lives in: House/apartment Stairs: Yes: External: 3 steps; on right going up Has following equipment at home: None  OCCUPATION: retired  PLOF: Independent  PATIENT GOALS Decrease pain.  OBJECTIVE:   DIAGNOSTIC FINDINGS:  No xray, MRI  POSTURE: rounded shoulders, forward head, and R shoulder elevated, R scapula elevated and upwardly rotated   CERVICAL ROM:   Active ROM A/PROM (deg) eval  Flexion 100%  Extension 30%  Right lateral flexion 30%  Left lateral flexion 50%  Right rotation 80%  Left rotation 80%   (Blank rows = not tested)  UPPER EXTREMITY ROM:WFL B  UPPER EXTREMITY MMT: WNL B, however, with severe spasms in shoulders, anticipate he may have some weakness once the scapulae have returned to a  normal resting position.   TODAY'S TREATMENT:  09/06/21 NuStep L3 x 6 min Seated rows and lats 20# 2x10 Feet on ball K2C, trunk rotation, small bridges and isometric abs Passive ROM of the LE's STM with patient in sidelying, use of vibration to the lumbar and into the thoracic mms  09/04/21 UBE Level 3 x 6 minutes Seated rows and lats 20# 2x10 Feet on ball K2C, trunk rotation, small bridges and isometric abs Passive ROM of the LE's STM with patient in sidelying, use of vibration to the lumbar and into the thoracic mms    08/30/21 UBE L2 x 3 min for and back STM to B cervical paraspinals, Bup Traps, emphasizing R, B scalenes and R SCM, with stretch. R scapular mobilizations into abd/depression, neural glides on R. Seated lower body weight shifts over planted hands, therapist facilitating postural changes for stretch, repeated to each side, 4 reps. Seated on physiodisc, lower trunk weight shifts, ant/post, B , and clockface x 2 in each direction. Pendulum exercises with 6# weight, B lateral and forward/back swaying.  08/28/21 UBE L2 x 6 min, 3 mins backwards/forwards 3# WaTE bar shoulder flexion, extension, IR x10 each Rows/lat pull down 35# 2x10 Shoulder extension 10# 2x10 Tricep extension 35# 2x10    PATIENT EDUCATION:  Education details: POC, HEP Person educated: Patient and Spouse Education method: Explanation, Demonstration, and Handouts Education comprehension: verbalized understanding and returned demonstration   HOME EXERCISE PROGRAM:  [id number]  ASSESSMENT:  CLINICAL IMPRESSION: Continued with last session due to pt subjective reports of improvement. He did continues to required cues to maintain correct form with seated rows and lats. Cue needed to keep pressure in heels with supine K2C, used vibration for the STM he had a few spots that were very tight and tender  OBJECTIVE IMPAIRMENTS decreased activity tolerance, decreased mobility, decreased ROM, decreased  strength, hypomobility, increased fascial restrictions, increased muscle spasms, impaired flexibility, impaired tone, impaired UE functional use, improper body mechanics, postural dysfunction, and pain.   ACTIVITY LIMITATIONS carrying, lifting, sleeping, and reach over head  PARTICIPATION LIMITATIONS: cleaning, laundry, and yard work  PERSONAL FACTORS Past/current experiences, Time since onset of injury/illness/exacerbation, and 1-2 comorbidities: TBI and Thoracic compression fracture in the past  are also affecting patient's functional outcome.   REHAB POTENTIAL: Good  CLINICAL DECISION MAKING: Stable/uncomplicated  EVALUATION COMPLEXITY: Moderate   GOALS: Goals reviewed with patient? Yes  SHORT TERM GOALS: Target date: 09/02/21   Patient will be I with basic HEP Baseline: provided. Goal status: ongoing  LONG TERM GOALS: Target date: 10/17/21  I with final HEP Baseline:  Goal status: INITIAL  2.  Patient will stand with normalized posture, including B scapulae resting in normal position, minimized rounded shoulders. Baseline: severe rounded shoulders, R scapula elevated and upwrdly rotated. Goal status: ongoing  3.  Paitent will report no more than 2/10 pain at any time throughout his daily activities. Baseline: up to 6/10 Goal status: ongoing  4.  Patient will demonstrate normal scapulo-humeral rhythm during R shoulder elevation. Baseline: R shoulder held in elevation and upward rotation. Goal status: INITIAL  5.  Patient will demosntrate normal muscle tone in R upper quadrant. Baseline: severe spasms in entire R upper quadrant. Goal status: INITIAL  PLAN: PT FREQUENCY: 2x/week  PT DURATION: 10 weeks  PLANNED INTERVENTIONS: Therapeutic exercises, Therapeutic activity, Neuromuscular re-education, Balance training, Gait training, Patient/Family education, Joint mobilization, Dry Needling, Electrical stimulation, Cryotherapy, Moist heat, Taping, Vasopneumatic device,  Ultrasound, Ionotophoresis 4mg /ml Dexamethasone, and Manual therapy  PLAN FOR NEXT SESSION: Additional postural strengthening and stretching.   , PT 09/06/21 11:46 AM

## 2021-09-12 ENCOUNTER — Encounter: Payer: Self-pay | Admitting: Physical Therapy

## 2021-09-12 ENCOUNTER — Telehealth: Payer: Self-pay

## 2021-09-12 ENCOUNTER — Ambulatory Visit: Payer: Medicare PPO | Attending: Family Medicine | Admitting: Physical Therapy

## 2021-09-12 DIAGNOSIS — M25511 Pain in right shoulder: Secondary | ICD-10-CM | POA: Diagnosis present

## 2021-09-12 DIAGNOSIS — R600 Localized edema: Secondary | ICD-10-CM | POA: Diagnosis present

## 2021-09-12 DIAGNOSIS — R278 Other lack of coordination: Secondary | ICD-10-CM | POA: Insufficient documentation

## 2021-09-12 DIAGNOSIS — R252 Cramp and spasm: Secondary | ICD-10-CM | POA: Diagnosis present

## 2021-09-12 DIAGNOSIS — G8929 Other chronic pain: Secondary | ICD-10-CM | POA: Insufficient documentation

## 2021-09-12 DIAGNOSIS — R293 Abnormal posture: Secondary | ICD-10-CM | POA: Diagnosis present

## 2021-09-12 NOTE — Telephone Encounter (Signed)
Tyler Lane called to request Tyler Lane Methylphenidate to be sent, to the pharmacy.   I have attempted to return the call however no voice ID.   Dr. Riley Kill sent in 2 scripts on his last refill. Patient can call the Pharmacy to find out when it will be available.

## 2021-09-12 NOTE — Therapy (Signed)
OUTPATIENT PHYSICAL THERAPY CERVICAL TREATMENT   Patient Name: Tyler Lane MRN: [id number] DOB:10/25/1950, 71 y.o., male Today's Date: 09/12/2021   PT End of Session - 09/12/21 1147     Visit Number 9    Date for PT Re-Evaluation 10/17/21    PT Start Time 1145    PT Stop Time 3532    PT Time Calculation (min) 45 min    Activity Tolerance Patient tolerated treatment well    Behavior During Therapy [facility] for tasks assessed/performed               Past Medical History:  Diagnosis Date   Back injury 01/16/2020   Depression    Heart murmur    "closed"   Hepatitis    hepatatis C - treated    High cholesterol    HOH (hard of hearing)    mild in the right and moderate to severe in the left   Hypertension    Pneumonia    SAH (subarachnoid hemorrhage) ([place]) 2018   TBI (traumatic brain injury) ([place]) 2018   Tinnitus of left ear    Past Surgical History:  Procedure Laterality Date   KYPHOPLASTY N/A 04/15/2020   Procedure: KYPHOPLASTY Thoracic twelve;  Surgeon: Erline Levine, MD;  Location: [place];  Service: Neurosurgery;  Laterality: N/A;   NASAL SINUS SURGERY     Patient Active Problem List   Diagnosis Date Noted   Myofascial neck pain 06/28/2021   Thoracic vertebral fracture ([place]) 04/15/2020   Thoracic compression fracture, sequela 03/02/2020   Fatigue 08/26/2017   Primary insomnia 08/26/2017   Diplopia 12/17/2016   Sleep disorder    Hyperglycemia    Benign essential HTN    Dysphagia    SAH (subarachnoid hemorrhage) ([place]) 07/02/2016   Intracerebral hemorrhage, intraventricular ([place]) 07/02/2016   Right clavicle fracture 07/02/2016   Fracture of multiple ribs 07/02/2016   Sacral fracture ([place]) 07/02/2016   Chest trauma    Closed fracture of one rib    Urinary retention    Pneumonia of right lower lobe due to methicillin susceptible Staphylococcus aureus (MSSA) (HCC)    Prerenal azotemia    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,  sequela ([place])    Fracture of clavicle, right, closed 06/22/2016   TBI (traumatic brain injury) ([place]) 06/21/2016    PCP: [place], Sarah T  REFERRING PROVIDER: Meredith Staggers, MD   REFERRING DIAG: Diagnosis S06.2X3S (ICD-10-CM) - 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, sequela (HCC) M54.2 (ICD-10-CM) - Myofascial neck pain   THERAPY DIAG:  Chronic right shoulder pain  Localized edema  Abnormal posture  Other lack of coordination  Cramp and spasm  Rationale for Evaluation and Treatment Rehabilitation  ONSET DATE: 06/28/2021   SUBJECTIVE:  SUBJECTIVE STATEMENT: Doing pretty good"    PERTINENT HISTORY:  TBI, clavicular fracture, thoracic compression fracture.  PAIN:  Are you having pain? Yes 3/10 shoulders and neck area feels stiff and tight  PRECAUTIONS: None  WEIGHT BEARING RESTRICTIONS No  FALLS:  Has patient fallen in last 6 months? No  LIVING ENVIRONMENT: Lives with: lives with their spouse Lives in: House/apartment Stairs: Yes: External: 3 steps; on right going up Has following equipment at home: None  OCCUPATION: retired  PLOF: Independent  PATIENT GOALS Decrease pain.  OBJECTIVE:   DIAGNOSTIC FINDINGS:  No xray, MRI  POSTURE: rounded shoulders, forward head, and R shoulder elevated, R scapula elevated and upwardly rotated   CERVICAL ROM:   Active ROM A/PROM (deg) eval  Flexion 100%  Extension 30%  Right lateral flexion 30%  Left lateral flexion 50%  Right rotation 80%  Left rotation 80%   (Blank rows = not tested)  UPPER EXTREMITY ROM:WFL B  UPPER EXTREMITY MMT: WNL B, however, with severe spasms in shoulders, anticipate he may have some weakness once the scapulae have returned to a normal resting  position.   TODAY'S TREATMENT:  09/12/21 NuStep L3 x 6 min  UBE L2 x2 min each Shoulder Ext green 2x10 Feet on ball K2C, trunk rotation, small bridges and isometric abs Passive ROM of the LE's STM with patient in sidelying, use of vibration to the lumbar and into the thoracic mms   09/06/21 NuStep L3 x 6 min Seated rows and lats 20# 2x10 Feet on ball K2C, trunk rotation, small bridges and isometric abs Passive ROM of the LE's STM with patient in sidelying, use of vibration to the lumbar and into the thoracic mms  09/04/21 UBE Level 3 x 6 minutes Seated rows and lats 20# 2x10 Feet on ball K2C, trunk rotation, small bridges and isometric abs Passive ROM of the LE's STM with patient in sidelying, use of vibration to the lumbar and into the thoracic mms    08/30/21 UBE L2 x 3 min for and back STM to B cervical paraspinals, Bup Traps, emphasizing R, B scalenes and R SCM, with stretch. R scapular mobilizations into abd/depression, neural glides on R. Seated lower body weight shifts over planted hands, therapist facilitating postural changes for stretch, repeated to each side, 4 reps. Seated on physiodisc, lower trunk weight shifts, ant/post, B , and clockface x 2 in each direction. Pendulum exercises with 6# weight, B lateral and forward/back swaying.  08/28/21 UBE L2 x 6 min, 3 mins backwards/forwards 3# WaTE bar shoulder flexion, extension, IR x10 each Rows/lat pull down 35# 2x10 Shoulder extension 10# 2x10 Tricep extension 35# 2x10    PATIENT EDUCATION:  Education details: POC, HEP Person educated: Patient and Spouse Education method: Explanation, Demonstration, and Handouts Education comprehension: verbalized understanding and returned demonstration   HOME EXERCISE PROGRAM:  [id number]  ASSESSMENT:  CLINICAL IMPRESSION: Pt enters with reports of feeling well. He did continues to required cues to maintain correct form with seated rows and lats. Some core instability  present with supine bridges LE on pball., again used vibration for the STM he had a few spots that were very tight and tender. Pt has tightness in both hamstrings.  OBJECTIVE IMPAIRMENTS decreased activity tolerance, decreased mobility, decreased ROM, decreased strength, hypomobility, increased fascial restrictions, increased muscle spasms, impaired flexibility, impaired tone, impaired UE functional use, improper body mechanics, postural dysfunction, and pain.   ACTIVITY LIMITATIONS carrying, lifting, sleeping, and reach over head  PARTICIPATION LIMITATIONS: cleaning, laundry,  and yard work  PERSONAL FACTORS Past/current experiences, Time since onset of injury/illness/exacerbation, and 1-2 comorbidities: TBI and Thoracic compression fracture in the past  are also affecting patient's functional outcome.   REHAB POTENTIAL: Good  CLINICAL DECISION MAKING: Stable/uncomplicated  EVALUATION COMPLEXITY: Moderate   GOALS: Goals reviewed with patient? Yes  SHORT TERM GOALS: Target date: 09/02/21   Patient will be I with basic HEP Baseline: provided. Goal status: met  LONG TERM GOALS: Target date: 10/17/21  I with final HEP Baseline:  Goal status: INITIAL  2.  Patient will stand with normalized posture, including B scapulae resting in normal position, minimized rounded shoulders. Baseline: severe rounded shoulders, R scapula elevated and upwrdly rotated. Goal status: ongoing  3.  Paitent will report no more than 2/10 pain at any time throughout his daily activities. Baseline: up to 6/10 Goal status: ongoing  4.  Patient will demonstrate normal scapulo-humeral rhythm during R shoulder elevation. Baseline: R shoulder held in elevation and upward rotation. Goal status: INITIAL  5.  Patient will demosntrate normal muscle tone in R upper quadrant. Baseline: severe spasms in entire R upper quadrant. Goal status: INITIAL  PLAN: PT FREQUENCY: 2x/week  PT DURATION: 10 weeks  PLANNED  INTERVENTIONS: Therapeutic exercises, Therapeutic activity, Neuromuscular re-education, Balance training, Gait training, Patient/Family education, Joint mobilization, Dry Needling, Electrical stimulation, Cryotherapy, Moist heat, Taping, Vasopneumatic device, Ultrasound, Ionotophoresis 84m/ml Dexamethasone, and Manual therapy  PLAN FOR NEXT SESSION: Additional postural strengthening and stretching.   MLum Babe PT 09/12/21 11:49 AM

## 2021-09-20 ENCOUNTER — Encounter: Payer: Medicare PPO | Admitting: Physical Medicine & Rehabilitation

## 2021-10-16 ENCOUNTER — Other Ambulatory Visit: Payer: Self-pay | Admitting: Physical Medicine & Rehabilitation

## 2021-10-17 ENCOUNTER — Telehealth: Payer: Self-pay | Admitting: *Deleted

## 2021-10-17 NOTE — Telephone Encounter (Signed)
Tyler Lane called for a refill on Tyler Lane's methylphenidate. Last filled per PMP 09/17/21.

## 2021-10-19 ENCOUNTER — Telehealth: Payer: Self-pay | Admitting: *Deleted

## 2021-10-19 ENCOUNTER — Other Ambulatory Visit (HOSPITAL_COMMUNITY): Payer: Self-pay

## 2021-10-19 MED ORDER — METHYLPHENIDATE HCL 10 MG PO TABS
10.0000 mg | ORAL_TABLET | Freq: Three times a day (TID) | ORAL | 0 refills | Status: DC
  Filled 2021-10-19: qty 90, 30d supply, fill #0

## 2021-10-19 NOTE — Telephone Encounter (Signed)
Pharmacies are out of Methylphenidate 10 mg tablets. I have found it at [facility] outpt pharmacy on [address] [facility]. Please send this in for Mr Pavey.

## 2021-10-19 NOTE — Telephone Encounter (Signed)
PMP was Reviewed.  Methylphenidate e-scribed today.Family is aware of the above.

## 2021-10-19 NOTE — Telephone Encounter (Signed)
Kendal Hymen called and is asking if Mr Vanderweele can have a renewal of his PT therapy. She felt like it was helping him.

## 2021-10-20 ENCOUNTER — Other Ambulatory Visit (HOSPITAL_COMMUNITY): Payer: Self-pay

## 2021-10-20 NOTE — Telephone Encounter (Signed)
Typically therapy will reach out to me if they're looking for more visits. Did they discuss more therapy with Onalee Hua?

## 2021-11-16 ENCOUNTER — Telehealth: Payer: Self-pay

## 2021-11-16 DIAGNOSIS — S062X3S Diffuse traumatic brain injury with loss of consciousness of 1 hour to 5 hours 59 minutes, sequela: Secondary | ICD-10-CM

## 2021-11-16 NOTE — Addendum Note (Signed)
Addended by: Franchot Gallo on: 11/16/2021 03:29 PM   Modules accepted: Orders

## 2021-11-16 NOTE — Telephone Encounter (Signed)
ERROR

## 2021-11-16 NOTE — Telephone Encounter (Addendum)
Horris Latino to call back with pharmacy location to refill  the Methylphenidate 10 MG. SEND TO [facility] [place] [address].   PMP:   Filled  Written  ID  Drug  QTY  Days  Prescriber  RX #  Dispenser  Refill  Daily Dose*  Pymt Type  PMP  10/19/2021 10/19/2021 2  Methylphenidate 10 Mg Tablet 90.00 30 Eu Tho [id number] Con (0126) 0/0  Other [facility] 09/17/2021 08/16/2021 1  Methylphenidate 10 Mg Tablet 90.00 30 Za Swa [id number] Nor ([id number]) 0/0  Medicare [place]

## 2021-11-17 MED ORDER — METHYLPHENIDATE HCL 10 MG PO TABS: 10.0000 mg | ORAL_TABLET | Freq: Three times a day (TID) | ORAL | 0 refills | Status: DC

## 2021-11-17 MED ORDER — METHYLPHENIDATE HCL 10 MG PO TABS
10.0000 mg | ORAL_TABLET | Freq: Three times a day (TID) | ORAL | 0 refills | Status: DC
Start: 2021-11-17 — End: 2021-12-15

## 2021-11-17 NOTE — Addendum Note (Signed)
Addended by: Alger Simons T on: 11/17/2021 11:43 AM   Modules accepted: Orders

## 2021-11-17 NOTE — Telephone Encounter (Signed)
Rx written and sent to the pharmacy. Thanks!  

## 2021-11-27 ENCOUNTER — Other Ambulatory Visit: Payer: Self-pay | Admitting: Physical Medicine & Rehabilitation

## 2021-11-27 DIAGNOSIS — F5101 Primary insomnia: Secondary | ICD-10-CM

## 2021-12-06 ENCOUNTER — Ambulatory Visit: Payer: Medicare PPO | Admitting: Physical Medicine & Rehabilitation

## 2021-12-15 ENCOUNTER — Telehealth: Payer: Self-pay | Admitting: Physical Medicine & Rehabilitation

## 2021-12-15 DIAGNOSIS — S062X3S Diffuse traumatic brain injury with loss of consciousness of 1 hour to 5 hours 59 minutes, sequela: Secondary | ICD-10-CM

## 2021-12-15 MED ORDER — METHYLPHENIDATE HCL 10 MG PO TABS
10.0000 mg | ORAL_TABLET | Freq: Three times a day (TID) | ORAL | 0 refills | Status: DC
  Filled 2021-12-15: qty 90, 30d supply, fill #0
  Filled 2022-01-18: qty 1, 1d supply, fill #0
  Filled 2022-01-18: qty 89, 29d supply, fill #0

## 2021-12-15 MED ORDER — METHYLPHENIDATE HCL 10 MG PO TABS
10.0000 mg | ORAL_TABLET | Freq: Three times a day (TID) | ORAL | 0 refills | Status: DC
  Filled 2021-12-15: qty 90, 30d supply, fill #0

## 2021-12-15 NOTE — Telephone Encounter (Signed)
Patient can't find a pharmacy that has methphenidate, they have tried 2 local [facility] and [facility] near them and also [facility].  Please call.

## 2021-12-15 NOTE — Telephone Encounter (Signed)
[facility]  thx

## 2021-12-18 ENCOUNTER — Other Ambulatory Visit (HOSPITAL_BASED_OUTPATIENT_CLINIC_OR_DEPARTMENT_OTHER): Payer: Self-pay

## 2021-12-20 ENCOUNTER — Encounter: Payer: Medicare PPO | Attending: Physical Medicine & Rehabilitation | Admitting: Physical Medicine & Rehabilitation

## 2022-01-15 ENCOUNTER — Other Ambulatory Visit (HOSPITAL_BASED_OUTPATIENT_CLINIC_OR_DEPARTMENT_OTHER): Payer: Self-pay

## 2022-01-15 NOTE — Telephone Encounter (Signed)
error 

## 2022-01-18 ENCOUNTER — Other Ambulatory Visit (HOSPITAL_BASED_OUTPATIENT_CLINIC_OR_DEPARTMENT_OTHER): Payer: Self-pay

## 2022-02-16 ENCOUNTER — Other Ambulatory Visit: Payer: Self-pay | Admitting: Physical Medicine & Rehabilitation

## 2022-02-16 ENCOUNTER — Other Ambulatory Visit (HOSPITAL_BASED_OUTPATIENT_CLINIC_OR_DEPARTMENT_OTHER): Payer: Self-pay

## 2022-02-16 DIAGNOSIS — S062X3S Diffuse traumatic brain injury with loss of consciousness of 1 hour to 5 hours 59 minutes, sequela: Secondary | ICD-10-CM

## 2022-02-16 MED ORDER — METHYLPHENIDATE HCL 10 MG PO TABS: 10.0000 mg | ORAL_TABLET | Freq: Three times a day (TID) | ORAL | 0 refills | Status: DC

## 2022-03-21 ENCOUNTER — Other Ambulatory Visit: Payer: Self-pay | Admitting: Physical Medicine & Rehabilitation

## 2022-04-04 ENCOUNTER — Encounter: Payer: Medicare PPO | Attending: Physical Medicine & Rehabilitation | Admitting: Physical Medicine & Rehabilitation

## 2022-04-04 ENCOUNTER — Encounter: Payer: Self-pay | Admitting: Physical Medicine & Rehabilitation

## 2022-04-04 VITALS — BP 131/93 | HR 80 | Ht 71.0 in | Wt 164.2 lb

## 2022-04-04 DIAGNOSIS — S062X3D Diffuse traumatic brain injury with loss of consciousness of 1 hour to 5 hours 59 minutes, subsequent encounter: Secondary | ICD-10-CM | POA: Diagnosis not present

## 2022-04-04 DIAGNOSIS — S22000S Wedge compression fracture of unspecified thoracic vertebra, sequela: Secondary | ICD-10-CM | POA: Diagnosis present

## 2022-04-04 DIAGNOSIS — S22000D Wedge compression fracture of unspecified thoracic vertebra, subsequent encounter for fracture with routine healing: Secondary | ICD-10-CM | POA: Diagnosis not present

## 2022-04-04 DIAGNOSIS — S062X3S Diffuse traumatic brain injury with loss of consciousness of 1 hour to 5 hours 59 minutes, sequela: Secondary | ICD-10-CM

## 2022-04-04 MED ORDER — METHYLPHENIDATE HCL 10 MG PO TABS: 10.0000 mg | ORAL_TABLET | Freq: Three times a day (TID) | ORAL | 0 refills | Status: DC

## 2022-04-04 NOTE — Patient Instructions (Addendum)
ALWAYS FEEL FREE TO CALL OUR OFFICE WITH ANY PROBLEMS OR QUESTIONS [phone]  **PLEASE NOTE** ALL MEDICATION REFILL REQUESTS (INCLUDING CONTROLLED SUBSTANCES) NEED TO BE MADE AT LEAST 7 DAYS PRIOR TO REFILL BEING DUE. ANY REFILL REQUESTS INSIDE THAT TIME FRAME MAY RESULT IN DELAYS IN RECEIVING YOUR PRESCRIPTION.    WORK ON GETTING OUT TO WALK AND EXERCISE MORE. IF YOU INCREASE YOUR CORE AND LEG STRENGTH YOUR BALANCE SHOULD IMPROVE.     SENOKOT-S 2TABS AT BEDTIME EVERY DAY.  YOU CAN INCREASE TO 2 TWICE DAILY    INCREASE FLUIDS AND VEGGIES AND FRUITS

## 2022-04-04 NOTE — Progress Notes (Signed)
Subjective:    Patient ID: Tyler Lane, male    DOB: 02/11/1951, 72 y.o.   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 He says he's not doing as well as he did before. His 22 yo dog passed away in 2023/01/14 and he's struggled with that.   He notices that his equilibrium has slipped. He admits to not being physically active over the last few months. Some of that is due to his dog's passing  He is constipated. He has an upcoming colonsocopy. He is using senna- gummies intermittently.   Urinary flow can ben an issue as well. He often has to awaken at night to void.   He remains on ritalin for attention and concentration .  Pain Inventory Average Pain 3 Pain Right Now 3 My pain is aching and cramp  LOCATION OF PAIN  neck, hand, back  BOWEL Number of stools per week: 1 Oral laxative use Yes  Type of laxative senakot Enema or suppository use No  History of colostomy No  Incontinent No   BLADDER Normal In and out cath, frequency . Able to self cath  . Bladder incontinence Yes  Frequent urination No  Leakage with coughing No  Difficulty starting stream No  Incomplete bladder emptying No    Mobility walk without assistance how many minutes can you walk? unlimited ability to climb steps?  yes do you drive?  no  Function not employed: date last employed . retired I need assistance with the following:  meal prep and household duties  Neuro/Psych bowel control problems  Prior Studies Any changes since last visit?  no  Physicians involved in your care Any changes since last visit?  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   Drug use: No   Sexual activity: Not on file  Other Topics Concern   Not on file  Social History Narrative   Not on file   Social Determinants of Health   Financial Resource Strain: Not on file  Food Insecurity: Not on file  Transportation Needs: Not on file  Physical Activity: Not on file  Stress: Not on file  Social Connections: Not on file   Past Surgical History:  Procedure Laterality Date   KYPHOPLASTY N/A 04/15/2020   Procedure: KYPHOPLASTY Thoracic twelve;  Surgeon: Erline Levine, MD;  Location: [facility];  Service: Neurosurgery;  Laterality: N/A;   NASAL SINUS SURGERY     Past Medical History:  Diagnosis Date   Back injury 01/16/2020   Depression    Heart murmur    "closed"   Hepatitis    hepatatis C - treated    High cholesterol    HOH (hard of hearing)  mild in the right and moderate to severe in the left   Hypertension    Pneumonia    SAH (subarachnoid hemorrhage) ([place]) 2018   TBI (traumatic brain injury) ([facility]) 2018   Tinnitus of left ear    BP (!) 131/93   Pulse 80   Ht 5' 11"$  (1.803 m)   Wt 164 lb 3.2 oz (74.5 kg)   SpO2 93%   BMI 22.90 kg/m   Opioid Risk Score:   Fall Risk Score:  `1  Depression screen PHQ 2/9     03/01/2021    2:04 PM 03/02/2020    1:25 PM 05/27/2017    2:55 PM  Depression screen PHQ 2/9  Decreased Interest 1 0 0  Down, Depressed, Hopeless 1 0 2  PHQ - 2 Score 2 0 2  Altered sleeping   0  Tired, decreased energy   0  Change in appetite   0  Feeling bad or failure about yourself    0  Trouble concentrating   0  Moving slowly or fidgety/restless   0  Suicidal thoughts   0  PHQ-9 Score   2  Difficult doing work/chores    Somewhat difficult      Review of Systems  Musculoskeletal:  Positive for back pain.       Hand pain  All other systems reviewed and are negative.     Objective:   Physical Exam  Constitutional: No distress . Vital signs reviewed. HEENT: NCAT, EOMI, oral membranes moist Neck: supple Cardiovascular: RRR without murmur. No JVD    Respiratory/Chest: CTA Bilaterally without wheezes or rales. Normal effort    GI/Abdomen: BS +,sl distended Ext: no clubbing, cyanosis, or edema Psych: pleasant and cooperative, concentration still lapses  Neuro: gaze appears conjugate..  Patient still demonstrates some attention deficits but demonstrates reasonable insight and awareness..   .. Motor 5/5.  Gait is stable  musculoskeletal:  MID back tenderness       Assessment & Plan:  1.  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             -DISCUSSED INCREASED PHYSICAL ACTIVITY 2.  right myofascial neck pain involving trapezius             -hep 4. Mood: continue celexa-            -improved, wife is supportive  as always 5. Neuropsych:             - Ritalin 10 mg at 7 AM, 11 AM, and 3 PM daily.         We will continue the controlled substance monitoring program, this consists of regular clinic visits, examinations, routine drug screening, pill counts as well as use of [place] Controlled Substance Reporting System. [place] today.   -RF'ed rx for March 6. Slow transit constipation  -senna-s 2 tablets qhs/bid 7. Visual changes: needs single vision lenses to drive.       -Ongoing diplopia.    per ophthalmology       -recommend f/u with regular ophtho for rx'es, reassessment 9. Right clavicle fracture: ortho signed off 10.  Fatigue:         -trazodone for sleep             -continue physical activity as tolerated  11.  T12 compression fx  much mproved after kyphoplasty                                      -robaxin and ibuprofen PRN                                     -        20 minutes of face to face patient care time were spent during this visit. All questions were encouraged and answered.  Follow up with me in 2 mos .

## 2022-05-02 ENCOUNTER — Ambulatory Visit: Payer: Medicare PPO | Admitting: Physical Medicine & Rehabilitation

## 2022-05-15 ENCOUNTER — Telehealth: Payer: Self-pay | Admitting: *Deleted

## 2022-05-15 DIAGNOSIS — S062X3S Diffuse traumatic brain injury with loss of consciousness of 1 hour to 5 hours 59 minutes, sequela: Secondary | ICD-10-CM

## 2022-05-15 MED ORDER — METHYLPHENIDATE HCL 10 MG PO TABS: 10.0000 mg | ORAL_TABLET | Freq: Three times a day (TID) | ORAL | 0 refills | Status: DC

## 2022-05-15 NOTE — Telephone Encounter (Signed)
Patient request refill.   methylphenidate (RITALIN) 10 MG tablet [id number]   Order Details Dose: 10 mg Route: Oral Frequency: 3 times daily  Dispense Quantity: 90 tablet Refills: 0   Note to Pharmacy: Do Not Fill Before 04/14/22       Sig: Take 1 tablet (10 mg total) by mouth 3 (three) times daily. At 0700 and 1100 and 1500 daily       Start Date: 04/04/22 End Date: --  Written Date: 04/04/22 Expiration Date: 10/01/22

## 2022-05-15 NOTE — Telephone Encounter (Signed)
Rxs sent. thx

## 2022-05-16 NOTE — Telephone Encounter (Signed)
LVM rx submitted.

## 2022-06-26 ENCOUNTER — Other Ambulatory Visit: Payer: Self-pay | Admitting: Physical Medicine & Rehabilitation

## 2022-06-26 DIAGNOSIS — F5101 Primary insomnia: Secondary | ICD-10-CM

## 2022-07-13 ENCOUNTER — Telehealth: Payer: Self-pay | Admitting: *Deleted

## 2022-07-13 DIAGNOSIS — S062X3S Diffuse traumatic brain injury with loss of consciousness of 1 hour to 5 hours 59 minutes, sequela: Secondary | ICD-10-CM

## 2022-07-13 MED ORDER — METHYLPHENIDATE HCL 10 MG PO TABS: 10.0000 mg | ORAL_TABLET | Freq: Three times a day (TID) | ORAL | 0 refills | Status: AC

## 2022-07-13 NOTE — Telephone Encounter (Signed)
Bonnie notified 

## 2022-07-13 NOTE — Telephone Encounter (Signed)
RF'ed and a second for July also written

## 2022-07-13 NOTE — Telephone Encounter (Signed)
Requesting refill on Methylphenidate 10 mg at [facility].

## 2022-09-13 DEATH — deceased

## 2022-10-03 ENCOUNTER — Ambulatory Visit: Payer: Medicare PPO | Admitting: Physical Medicine & Rehabilitation
